# Patient Record
Sex: Female | Born: 1950
Health system: Southern US, Community
[De-identification: ages and names within clinical notes are randomized; demographics above are authoritative.]

## PROBLEM LIST (undated history)

## (undated) DIAGNOSIS — N200 Calculus of kidney: Secondary | ICD-10-CM

## (undated) DIAGNOSIS — R0600 Dyspnea, unspecified: Secondary | ICD-10-CM

## (undated) DIAGNOSIS — R1310 Dysphagia, unspecified: Secondary | ICD-10-CM

## (undated) DIAGNOSIS — J9383 Other pneumothorax: Secondary | ICD-10-CM

## (undated) DIAGNOSIS — M8580 Other specified disorders of bone density and structure, unspecified site: Secondary | ICD-10-CM

## (undated) DIAGNOSIS — T4145XA Adverse effect of unspecified anesthetic, initial encounter: Secondary | ICD-10-CM

## (undated) DIAGNOSIS — K635 Polyp of colon: Secondary | ICD-10-CM

## (undated) DIAGNOSIS — E039 Hypothyroidism, unspecified: Secondary | ICD-10-CM

## (undated) DIAGNOSIS — R42 Dizziness and giddiness: Secondary | ICD-10-CM

## (undated) DIAGNOSIS — Z87442 Personal history of urinary calculi: Secondary | ICD-10-CM

## (undated) DIAGNOSIS — K562 Volvulus: Secondary | ICD-10-CM

## (undated) DIAGNOSIS — M1990 Unspecified osteoarthritis, unspecified site: Secondary | ICD-10-CM

## (undated) DIAGNOSIS — Z8709 Personal history of other diseases of the respiratory system: Secondary | ICD-10-CM

## (undated) DIAGNOSIS — K31A0 Gastric intestinal metaplasia, unspecified: Secondary | ICD-10-CM

## (undated) DIAGNOSIS — M419 Scoliosis, unspecified: Secondary | ICD-10-CM

## (undated) DIAGNOSIS — I10 Essential (primary) hypertension: Secondary | ICD-10-CM

## (undated) DIAGNOSIS — A6000 Herpesviral infection of urogenital system, unspecified: Secondary | ICD-10-CM

## (undated) DIAGNOSIS — K5090 Crohn's disease, unspecified, without complications: Secondary | ICD-10-CM

## (undated) DIAGNOSIS — T8859XA Other complications of anesthesia, initial encounter: Secondary | ICD-10-CM

## (undated) DIAGNOSIS — K2970 Gastritis, unspecified, without bleeding: Secondary | ICD-10-CM

## (undated) DIAGNOSIS — R0609 Other forms of dyspnea: Secondary | ICD-10-CM

## (undated) DIAGNOSIS — J449 Chronic obstructive pulmonary disease, unspecified: Secondary | ICD-10-CM

## (undated) DIAGNOSIS — D125 Benign neoplasm of sigmoid colon: Secondary | ICD-10-CM

## (undated) DIAGNOSIS — Z87891 Personal history of nicotine dependence: Secondary | ICD-10-CM

## (undated) DIAGNOSIS — C801 Malignant (primary) neoplasm, unspecified: Secondary | ICD-10-CM

## (undated) DIAGNOSIS — K3189 Other diseases of stomach and duodenum: Secondary | ICD-10-CM

## (undated) HISTORY — PX: BREAST EXCISIONAL BIOPSY: SUR124

## (undated) HISTORY — DX: Calculus of kidney: N20.0

## (undated) HISTORY — DX: Dysphagia, unspecified: R13.10

## (undated) HISTORY — DX: Gastric intestinal metaplasia, unspecified: K31.A0

## (undated) HISTORY — DX: Polyp of colon: K63.5

## (undated) HISTORY — PX: APPENDECTOMY: SHX54

## (undated) HISTORY — PX: TUBAL LIGATION: SHX77

## (undated) HISTORY — DX: Herpesviral infection of urogenital system, unspecified: A60.00

## (undated) HISTORY — DX: Hypothyroidism, unspecified: E03.9

## (undated) HISTORY — DX: Benign neoplasm of sigmoid colon: D12.5

## (undated) HISTORY — DX: Other specified disorders of bone density and structure, unspecified site: M85.80

## (undated) HISTORY — PX: ABDOMINAL HYSTERECTOMY: SHX81

## (undated) HISTORY — DX: Other diseases of stomach and duodenum: K31.89

## (undated) HISTORY — DX: Crohn's disease, unspecified, without complications: K50.90

## (undated) HISTORY — DX: Volvulus: K56.2

## (undated) HISTORY — DX: Other pneumothorax: J93.83

## (undated) HISTORY — DX: Personal history of other diseases of the respiratory system: Z87.09

## (undated) HISTORY — DX: Unspecified osteoarthritis, unspecified site: M19.90

## (undated) HISTORY — DX: Gastritis, unspecified, without bleeding: K29.70

## (undated) HISTORY — DX: Malignant (primary) neoplasm, unspecified: C80.1

## (undated) HISTORY — DX: Chronic obstructive pulmonary disease, unspecified: J44.9

## (undated) HISTORY — DX: Personal history of nicotine dependence: Z87.891

---

## 1990-06-29 HISTORY — PX: VOLVULUS REDUCTION: SHX425

## 2010-07-10 ENCOUNTER — Ambulatory Visit: Payer: Self-pay | Admitting: Family Medicine

## 2010-09-03 ENCOUNTER — Ambulatory Visit: Payer: Self-pay | Admitting: Family Medicine

## 2011-03-18 ENCOUNTER — Ambulatory Visit: Payer: Self-pay | Admitting: Family Medicine

## 2012-07-11 ENCOUNTER — Ambulatory Visit: Payer: Self-pay | Admitting: Unknown Physician Specialty

## 2012-09-06 ENCOUNTER — Ambulatory Visit: Payer: Self-pay | Admitting: Family Medicine

## 2012-09-21 ENCOUNTER — Ambulatory Visit: Payer: Self-pay | Admitting: Family Medicine

## 2013-10-11 ENCOUNTER — Other Ambulatory Visit: Payer: Self-pay

## 2014-03-29 ENCOUNTER — Other Ambulatory Visit: Payer: Self-pay | Admitting: Unknown Physician Specialty

## 2014-03-29 LAB — CLOSTRIDIUM DIFFICILE(ARMC)

## 2014-03-31 LAB — STOOL CULTURE

## 2014-06-23 ENCOUNTER — Ambulatory Visit: Payer: Self-pay | Admitting: Physician Assistant

## 2014-07-24 ENCOUNTER — Inpatient Hospital Stay: Payer: Self-pay | Admitting: Surgery

## 2014-07-24 ENCOUNTER — Ambulatory Visit: Payer: Self-pay

## 2014-07-24 LAB — BASIC METABOLIC PANEL
ANION GAP: 10 (ref 7–16)
BUN: 9 mg/dL (ref 7–18)
CALCIUM: 9.3 mg/dL (ref 8.5–10.1)
CHLORIDE: 105 mmol/L (ref 98–107)
Co2: 27 mmol/L (ref 21–32)
Creatinine: 0.71 mg/dL (ref 0.60–1.30)
EGFR (African American): >60
EGFR (Non-African Amer.): >60
Glucose: 83 mg/dL (ref 65–99)
OSMOLALITY: 281 (ref 275–301)
POTASSIUM: 3.6 mmol/L (ref 3.5–5.1)
Sodium: 142 mmol/L (ref 136–145)

## 2014-07-24 LAB — CBC WITH DIFFERENTIAL/PLATELET
BASOS ABS: 0.1 10*3/uL (ref 0.0–0.1)
BASOS PCT: 1 %
Eosinophil #: 0 10*3/uL (ref 0.0–0.7)
Eosinophil %: 0.5 %
HCT: 41.5 % (ref 35.0–47.0)
HGB: 13.9 g/dL (ref 12.0–16.0)
LYMPHS ABS: 1.8 10*3/uL (ref 1.0–3.6)
Lymphocyte %: 32.4 %
MCH: 37 pg — ABNORMAL HIGH (ref 26.0–34.0)
MCHC: 33.5 g/dL (ref 32.0–36.0)
MCV: 111 fL — AB (ref 80–100)
MONOS PCT: 7.3 %
Monocyte #: 0.4 x10 3/mm (ref 0.2–0.9)
NEUTROS ABS: 3.3 10*3/uL (ref 1.4–6.5)
Neutrophil %: 58.8 %
Platelet: 516 10*3/uL — ABNORMAL HIGH (ref 150–440)
RBC: 3.75 10*6/uL — ABNORMAL LOW (ref 3.80–5.20)
RDW: 16.2 % — AB (ref 11.5–14.5)
WBC: 5.7 10*3/uL (ref 3.6–11.0)

## 2014-07-24 LAB — PROTIME-INR
INR: 1.1
Prothrombin Time: 14.5 secs (ref 11.5–14.7)

## 2014-07-24 LAB — APTT: Activated PTT: 40.4 secs — ABNORMAL HIGH (ref 23.6–35.9)

## 2014-07-30 HISTORY — PX: TALC PLEURODESIS: SHX2506

## 2014-07-30 HISTORY — PX: THORACOSCOPY: SUR1347

## 2014-08-02 ENCOUNTER — Ambulatory Visit: Payer: Self-pay | Admitting: Cardiothoracic Surgery

## 2014-08-02 DIAGNOSIS — J90 Pleural effusion, not elsewhere classified: Secondary | ICD-10-CM | POA: Diagnosis not present

## 2014-08-02 DIAGNOSIS — J9311 Primary spontaneous pneumothorax: Secondary | ICD-10-CM | POA: Diagnosis not present

## 2014-08-02 DIAGNOSIS — J939 Pneumothorax, unspecified: Secondary | ICD-10-CM | POA: Diagnosis not present

## 2014-08-08 DIAGNOSIS — J449 Chronic obstructive pulmonary disease, unspecified: Secondary | ICD-10-CM | POA: Diagnosis not present

## 2014-08-08 DIAGNOSIS — R030 Elevated blood-pressure reading, without diagnosis of hypertension: Secondary | ICD-10-CM | POA: Diagnosis not present

## 2014-08-08 DIAGNOSIS — E039 Hypothyroidism, unspecified: Secondary | ICD-10-CM | POA: Diagnosis not present

## 2014-08-08 DIAGNOSIS — K5090 Crohn's disease, unspecified, without complications: Secondary | ICD-10-CM | POA: Diagnosis not present

## 2014-08-13 ENCOUNTER — Inpatient Hospital Stay: Payer: Self-pay | Admitting: Cardiothoracic Surgery

## 2014-08-13 DIAGNOSIS — F17210 Nicotine dependence, cigarettes, uncomplicated: Secondary | ICD-10-CM | POA: Diagnosis not present

## 2014-08-13 DIAGNOSIS — J439 Emphysema, unspecified: Secondary | ICD-10-CM | POA: Diagnosis not present

## 2014-08-13 DIAGNOSIS — J90 Pleural effusion, not elsewhere classified: Secondary | ICD-10-CM | POA: Diagnosis not present

## 2014-08-13 DIAGNOSIS — K5090 Crohn's disease, unspecified, without complications: Secondary | ICD-10-CM | POA: Diagnosis not present

## 2014-08-13 DIAGNOSIS — J984 Other disorders of lung: Secondary | ICD-10-CM | POA: Diagnosis not present

## 2014-08-13 DIAGNOSIS — J982 Interstitial emphysema: Secondary | ICD-10-CM | POA: Diagnosis not present

## 2014-08-13 DIAGNOSIS — J9383 Other pneumothorax: Secondary | ICD-10-CM | POA: Diagnosis not present

## 2014-08-13 DIAGNOSIS — J948 Other specified pleural conditions: Secondary | ICD-10-CM | POA: Diagnosis not present

## 2014-08-13 DIAGNOSIS — E039 Hypothyroidism, unspecified: Secondary | ICD-10-CM | POA: Diagnosis not present

## 2014-08-13 DIAGNOSIS — T8182XA Emphysema (subcutaneous) resulting from a procedure, initial encounter: Secondary | ICD-10-CM | POA: Diagnosis not present

## 2014-08-13 DIAGNOSIS — J939 Pneumothorax, unspecified: Secondary | ICD-10-CM | POA: Diagnosis not present

## 2014-08-13 DIAGNOSIS — Z8249 Family history of ischemic heart disease and other diseases of the circulatory system: Secondary | ICD-10-CM | POA: Diagnosis not present

## 2014-08-13 DIAGNOSIS — R0689 Other abnormalities of breathing: Secondary | ICD-10-CM | POA: Diagnosis not present

## 2014-08-13 DIAGNOSIS — J9809 Other diseases of bronchus, not elsewhere classified: Secondary | ICD-10-CM | POA: Diagnosis not present

## 2014-08-13 DIAGNOSIS — Z4682 Encounter for fitting and adjustment of non-vascular catheter: Secondary | ICD-10-CM | POA: Diagnosis not present

## 2014-08-13 DIAGNOSIS — R079 Chest pain, unspecified: Secondary | ICD-10-CM | POA: Diagnosis not present

## 2014-08-13 DIAGNOSIS — I700 Atherosclerosis of aorta: Secondary | ICD-10-CM | POA: Diagnosis not present

## 2014-08-13 DIAGNOSIS — B009 Herpesviral infection, unspecified: Secondary | ICD-10-CM | POA: Diagnosis not present

## 2014-08-13 DIAGNOSIS — J9811 Atelectasis: Secondary | ICD-10-CM | POA: Diagnosis not present

## 2014-08-13 DIAGNOSIS — J9382 Other air leak: Secondary | ICD-10-CM | POA: Diagnosis not present

## 2014-08-13 DIAGNOSIS — J432 Centrilobular emphysema: Secondary | ICD-10-CM | POA: Diagnosis not present

## 2014-08-28 ENCOUNTER — Ambulatory Visit
Admit: 2014-08-28 | Disposition: A | Discharge status: Home / Self Care | Payer: Self-pay | Attending: Cardiothoracic Surgery | Admitting: Cardiothoracic Surgery

## 2014-08-30 DIAGNOSIS — J90 Pleural effusion, not elsewhere classified: Secondary | ICD-10-CM | POA: Diagnosis not present

## 2014-08-30 DIAGNOSIS — J939 Pneumothorax, unspecified: Secondary | ICD-10-CM | POA: Diagnosis not present

## 2014-09-03 DIAGNOSIS — J449 Chronic obstructive pulmonary disease, unspecified: Secondary | ICD-10-CM | POA: Diagnosis not present

## 2014-09-03 DIAGNOSIS — E039 Hypothyroidism, unspecified: Secondary | ICD-10-CM | POA: Diagnosis not present

## 2014-09-03 DIAGNOSIS — J939 Pneumothorax, unspecified: Secondary | ICD-10-CM | POA: Diagnosis not present

## 2014-09-06 ENCOUNTER — Encounter: Payer: Self-pay | Admitting: Internal Medicine

## 2014-09-06 ENCOUNTER — Ambulatory Visit (INDEPENDENT_AMBULATORY_CARE_PROVIDER_SITE_OTHER): Payer: Commercial Managed Care - HMO | Admitting: Internal Medicine

## 2014-09-06 VITALS — BP 124/66 | HR 77 | Temp 97.8°F | Ht 61.0 in | Wt 129.0 lb

## 2014-09-06 DIAGNOSIS — Z716 Tobacco abuse counseling: Secondary | ICD-10-CM | POA: Diagnosis not present

## 2014-09-06 DIAGNOSIS — R0600 Dyspnea, unspecified: Secondary | ICD-10-CM | POA: Diagnosis not present

## 2014-09-06 DIAGNOSIS — J431 Panlobular emphysema: Secondary | ICD-10-CM | POA: Diagnosis not present

## 2014-09-06 DIAGNOSIS — J9383 Other pneumothorax: Secondary | ICD-10-CM | POA: Diagnosis not present

## 2014-09-06 DIAGNOSIS — J449 Chronic obstructive pulmonary disease, unspecified: Secondary | ICD-10-CM | POA: Insufficient documentation

## 2014-09-06 NOTE — Patient Instructions (Signed)
Follow up with Dr. Stevenson Clinch in 6 weeks - pulmonary function testing and 6 minute walk test prior to follow up - avoid tobacco including - second hand smoke, cigar, ecig, vapor, chewing\snuff tobacco - continue using your incentive spirometry 3-4 times per day - exercise as tolerated.

## 2014-09-06 NOTE — Progress Notes (Signed)
Date: 09/06/2014  MRN# [id number] [name] 09-09-1950  Referring Physician: Dr. Marta Rodriguez  Joanna Rodriguez is a 64 y.o. old female seen in consultation for copd evaluation and spontaneous pneumothorax  CC:  Chief Complaint  Patient presents with  . Advice Only    Pt had pneumo, collapsed lung and 2 chest tubes Jan 26/16 and     HPI:  This is a pleasant 64 year old female referred by Dr. Durwin Rodriguez for COPD evaluation and workup along with episodes of 2 spontaneous pneumothorax in the last 2 months. Below is her smoking and history of COPD: - 1 episode of bronchitis per year, not every year - No intubations prior to recent hospitalization - No childhood asthma - Pet dog at home - Quit smoking Nov 2015 - No seasonal allergies - Left chest wall tenderness - Still with DOE - can walk to mailbox, or about 3 car lenghts  Patient stated that in Nov 2015 she had a bad "bronchitis" for which he continued to coughing spells and sob, in Jan 2015 had significant SOB, CXR showed L pneumothorax.  Had chest tube placed at time, then in Feb 2015, had a another spontaneous pneumothorax which required chest tube placed along with talc pleurodesis.  During per talc pleurodesis procedure Dr. Genevive Rodriguez noted a single bleb on the left side.  Currently patient states that she is doing well, still with shortness of breath and cough, but overall with improvement.  She is currently not smoking   [facility] hospitalization: 08/13/2014 to 08/22/2014  Admitted for another episode of spontaneous pneumothorax on the left side Joanna Rodriguez is a 64 year old woman who presented with her second spontaneous pneumothorax on the left side within the last 2 months. Because of the CT scan findings showing severe emphysematous changes throughout all lobes of the lung, there was no one area in particular to resect. Therefore, she was offered a talc pleurodesis for management of her recurrent pneumothorax.    Procedures 08/13/2014-left chest tube placement or pneumothorax\hydropneumothorax 08/14/2014-talc pleurodesis  DATE OF ADMISSION:  08/13/2014 DATE OF DISCHARGE:  08/22/2014  ADMITTING DIAGNOSIS:  Recurrent pneumothorax.   DISCHARGE DIAGNOSIS:  Recurrent pneumothorax.   OPERATION PERFORMED:   1.  Insertion of tube thoracostomy.  2.  Left thoracoscopy with talc pleurodesis.   HOSPITAL COURSE:  Joanna Rodriguez is a 64 year old woman with an extensive smoking history and a prior history of a left-sided pneumothorax. She was admitted to the hospital on February 15, at which time she was found to have a large left-sided recurrent pneumothorax. She was initially treated with a chest tube, and her lung re-expanded. She was taken to the operating room on 08/14/2014, where she underwent a thoracoscopy with talc pleurodesis. At the time of her surgery, she had extensive underlying emphysematous changes without any obvious single solitary bleb disease. After her thoracoscopy, she continued to have extensive subcutaneous emphysema, but the tube appeared to be in good position. She was taken to the radiology suite, where a percutaneous drain was placed for several days. The operative tube was removed, and finally the single small pigtail catheter was removed as well. At no time did she have a pneumothorax that was obvious after the tubes were removed. Her subcutaneous emphysema continued to improve and at the time of discharge was minimal to mild.   At the time of discharge, her wounds were all healing as expected. She did not have any chest tubes in place. She was instructed to go back on her  medications, which included Synthroid, acyclovir, mercaptopurine, tramadol, and Percocet. She will follow up with Dr. Genevive Rodriguez in one week.    [facility] 1/26-1/29/2016 DATE OF ADMISSION:  07/24/2014 DATE OF DISCHARGE:  07/27/2014  BRIEF HISTORY: Joanna Rodriguez is a 64 year old woman seen in the  Emergency Room with a fairly large left pneumothorax. She has had marked problems with shortness of breath and upper respiratory infections for almost a month. She presented to the Emergency Room because she had increasing shortness of breath after evaluation at the [facility] primary care office in [place]. Chest x-ray revealed a significant pneumothorax. A chest tube was placed by the Emergency Room physician which accomplished almost complete re-expansion. She was admitted to hospital, maintained on suction, seen by the thoracic surgery service. Her air leak resolved. The tube was removed on the 29th and she was discharged home for followup in 1 week's time. Bathing, activity, and driving instructions were given to the patient.   DISCHARGE MEDICATIONS: She is to resume her home medications which include acyclovir 400 mg once a day, Synthroid 100 mcg once a day, tramadol 50 mg t.i.d., and mercaptopurine 50 mg tablets 1-1/2 tablets once a day.   FINAL DISCHARGE DIAGNOSIS: Upper respiratory infection with spontaneous pneumothorax.    PMHX:   Past Medical History  Diagnosis Date  . Hypothyroid   . Genital herpes   . Crohn's disease   . Volvulus    Surgical Hx:  Past Surgical History  Procedure Laterality Date  . Volvulus reduction    . Tubal ligation    . Talc pleurodesis  07/2014    left side  . Thoracoscopy  07/2014    left side   Family Hx:  Family History  Problem Relation Age of Onset  . Hypertension Mother   . Hypothyroidism Mother   . Alcohol abuse Mother   . Heart attack Father   . Heart disease Father   . Glaucoma Father    Social Hx:   History  Substance Use Topics  . Smoking status: Former Smoker -- 1.00 packs/day for 53 years    Types: Cigarettes  . Smokeless tobacco: Never Used     Comment: NOV 16,2015  . Alcohol Use: 0.0 oz/week    0 Standard drinks or equivalent per week     Comment: occasional   Medication:   Current Outpatient Rx  Name  Route  Sig  Dispense   Refill  . acyclovir (ZOVIRAX) 400 MG tablet   Oral   Take 400 mg by mouth daily.         Marland Kitchen aspirin EC 81 MG tablet   Oral   Take 81 mg by mouth daily.         . B Complex-Folic Acid (B COMPLEX-VITAMIN B12 PO)   Oral   Take 1 tablet by mouth daily.         . Calcium-Vitamin D 600-200 MG-UNIT per tablet   Oral   Take 1 tablet by mouth daily.         Marland Kitchen Co-Enzyme Q-10 30 MG CAPS   Oral   Take 1 capsule by mouth daily.         . Glucosamine-Chondroitin 500-400 MG CAPS   Oral   Take 2 tablets by mouth daily.         Marland Kitchen levothyroxine (SYNTHROID, LEVOTHROID) 100 MCG tablet   Oral   Take 100 mcg by mouth daily.         . Melatonin 5 MG TABS  Oral   Take 5 mg by mouth at bedtime as needed.         . mercaptopurine (PURINETHOL) 50 MG tablet   Oral   Take 50 mg by mouth daily. Take 1.5 tablets daily         . Multiple Vitamins-Minerals (MULTIVITAMIN WITH MINERALS) tablet   Oral   Take 1 tablet by mouth daily.         . Omega-3 Fatty Acids (FISH OIL) 1000 MG CAPS   Oral   Take 1,000 mg by mouth daily.         . traMADol (ULTRAM) 50 MG tablet   Oral   Take 50 mg by mouth every 4 (four) hours as needed.             Allergies:  Nsaids  Review of Systems: Gen:  Denies  fever, sweats, chills HEENT: Denies blurred vision, double vision, ear pain, eye pain, hearing loss, nose bleeds, sore throat Cvc:  No dizziness, chest pain or heaviness Resp:   Sob, DOE, chest wall pain on the left Gi: Denies swallowing difficulty, stomach pain, nausea or vomiting, diarrhea, constipation, bowel incontinence Gu:  Denies bladder incontinence, burning urine Ext:   No Joint pain, stiffness or swelling Skin: No skin rash, easy bruising or bleeding or hives Endoc:  No polyuria, polydipsia , polyphagia or weight change Psych: No depression, insomnia or hallucinations  Other:  All other systems negative  Physical Examination:   VS: There were no vitals taken for  this visit.  General Appearance: No distress  Neuro:without focal findings, mental status, speech normal, alert and oriented, cranial nerves 2-12 intact, reflexes normal and symmetric, sensation grossly normal  HEENT: PERRLA, EOM intact, no ptosis, no other lesions noticed; Mallampati 1 Pulmonary: normal breath sounds., diaphragmatic excursion normal.No wheezing, No rales, dec BS on the left base;   Sputum Production: none  CardiovascularNormal S1,S2.  No m/r/g.  Abdominal aorta pulsation normal.    Abdomen: Benign, Soft, non-tender, No masses, hepatosplenomegaly, No lymphadenopathy Renal:  No costovertebral tenderness  GU:  No performed at this time. Endoc: No evident thyromegaly, no signs of acromegaly or Cushing features Skin:   warm, no rashes, no ecchymosis  Extremities: normal, no cyanosis, clubbing, no edema, warm with normal capillary refill. Other findings:none   Labs results:   Rad results: (The following images and results were reviewed by Dr. Stevenson Clinch). 07/24/14 CXR There is a large pneumothorax on the left with slight tension component. The right lung is clear. There is atelectasis in the left base. The heart size is normal. The pulmonary vascularity is normal on the right and is reflective of the pneumothorax on the left. No adenopathy.  IMPRESSION: Large pneumothorax on the left with mild tension component.  08/30/14 CXR FINDINGS: Small left pleural effusion. No pneumothorax. No focal consolidation. Stable cardiomediastinal silhouette.  Near complete resolution of soft tissue emphysema.  The osseous structures are unremarkable.  IMPRESSION: Small left pleural effusion without a pneumothorax.   PROCEDURE: CT  - CT CHEST WITH CONTRAST  - Aug 13 2014  2:01PM   CLINICAL DATA:  64 year old with two recent episodes of spontaneous pneumothorax, the 1st on 07/24/2014 and the 2nd earlier today when she was out walking her dog, took a deep breath and had acute onset of  left-sided chest pain and shortness of breath. Chest tube placed earlier today while the patient was in the emergency department. Long-time smoker.  EXAM: CT CHEST WITH CONTRAST TECHNIQUE: Multidetector CT imaging  of the chest was performed during intravenous contrast administration.  CONTRAST:  75 ml Omnipaque 350 IV.  COMPARISON:  No prior chest CT. Multiple prior chest x-rays including earlier today.  FINDINGS: Residual small (15-20%) left pneumothorax. The indwelling left chest tube lies within the major fissure.  Severe emphysematous changes throughout both lungs. Mild atelectasis deep in the left lower lobe. Lungs otherwise clear without localized airspace consolidation, interstitial disease, or parenchymal nodules or masses. No pleural effusions. Central airways patent with moderate bronchial wall thickening.  No significant mediastinal, hilar or axillary lymphadenopathy. Thyroid gland normal in appearance.  Heart size normal. No visible coronary atherosclerosis. No pericardial effusion. Moderate atherosclerosis involving the thoracic and upper abdominal aorta. Calcified and noncalcified plaque at the origin of the left subclavian artery without evidence of hemodynamically significant stenosis.  Diffuse steatosis throughout the visualized liver. Small low-attenuation nodules involving both adrenal glands, that on the right measuring approximately 1.6 x 0.9 cm and the left measuring approximately 1.4 x 1.4 cm. Large amount of food within the normal-appearing stomach. This accounts for the contracted gallbladder. Remaining visualized upper abdomen unremarkable. Bone window images demonstrate diffuse degenerative disc disease and spondylosis throughout the lower cervical and thoracic spine.   IMPRESSION: 1. Residual small (15 20% or so) left pneumothorax with left chest tube in place. The chest tube is located within the major fissure. 2. Severe COPD/emphysema. 3.  Mild atelectasis deep in the left lower lobe. No acute cardiopulmonary disease otherwise. 4. Small bilateral adrenal nodules statistically consistent with adenomas.   Assessment and Plan:64 yo female former smoker with PMHx of hypothyroidism, recent Spontaneous Pneumothorax s\p talc pleurodesis, presenting for COPD workup and evaluation.  Spontaneous pneumothorax Hx of 2 left spontaneous PTX in Jan and Feb 2015, probably secondary to a bleb and coughing spells. She is has a severe emphysema per her CT. Now s\p talc Left pleurodesis. Given that she has severe emphysema on her current CT chest, she still at high risk for pneumothorax (especially on the right), cough control and avoidance of tobacco will be paramount in avoiding lowering her risk for another pneumothorax.  Plan: -Management as stated for COPD. -Avoid tobacco (all forms of it).   Dyspnea Multifactorial: COPD, deconditioning, pneumothorax, restricted chest wall mechanics secondary to pain and recent thoraoscopy with pleurodesis  Plan: -Exercise as tolerated, advance slowly. -Advised patient to continue with incentive spirometry on a daily basis -Avoid tobacco   Tobacco abuse counseling Tobacco Cessation - Counseling regarding benefits of smoking cessation strategies was provided for more than 12 min. - Educated that at this time smoking- cessation represents the single most important step that patient can take to enhance the length and quality of live. - Educated patient regarding alternatives of behavior interventions, pharmacotherapy including NRT and non-nicotine therapy such, and combinations of both. - Patient at this time: has quit    COPD (chronic obstructive pulmonary disease) Clinically patient with significant risk factors for COPD, and will diagnose as such. COPD-currently unable to objectively stage her level of obstruction, given that she has recently had a pneumothorax with chest tube placement and  pleurodesis on the left side. We agreed today that adequate healing time and small and simple breathing exercises with her incentive spirometer will provide benefit for her. Patient was educated on pneumothorax and COPD, she was noted to have 1 bullae during her recent hospitalization, but with severe emphysema noted on a CAT scan she is at increased risk for developing more bullae in the future (especially she  resumes smoking).   Plan: - Pulmonary function testing in 6 minute walk testing in 6 weeks - Avoid tobacco including the following: First and smoking, secondhand smoking, cigars, it yet chronic cigarettes, vapors, chewing tobacco or snuff - Continue using incentive spirometry 3-4 times per day - Exercise as tolerated, start off slow. - will consider starting bronchodilators (Symbicort and/or Spiriva) if the patient continues to have cough or worsening shortness of breath prior to followup visit.     Updated Medication List Outpatient Encounter Prescriptions as of 09/06/2014  Medication Sig  . acyclovir (ZOVIRAX) 400 MG tablet Take 400 mg by mouth daily.  Marland Kitchen aspirin EC 81 MG tablet Take 81 mg by mouth daily.  . B Complex-Folic Acid (B COMPLEX-VITAMIN B12 PO) Take 1 tablet by mouth daily.  . Calcium-Vitamin D 600-200 MG-UNIT per tablet Take 1 tablet by mouth daily.  Marland Kitchen Co-Enzyme Q-10 30 MG CAPS Take 1 capsule by mouth daily.  . Glucosamine-Chondroitin 500-400 MG CAPS Take 2 tablets by mouth daily.  Marland Kitchen levothyroxine (SYNTHROID, LEVOTHROID) 100 MCG tablet Take 100 mcg by mouth daily.  . Melatonin 5 MG TABS Take 5 mg by mouth at bedtime as needed.  . mercaptopurine (PURINETHOL) 50 MG tablet Take 50 mg by mouth daily. Take 1.5 tablets daily  . Multiple Vitamins-Minerals (MULTIVITAMIN WITH MINERALS) tablet Take 1 tablet by mouth daily.  . Omega-3 Fatty Acids (FISH OIL) 1000 MG CAPS Take 1,000 mg by mouth daily.  . traMADol (ULTRAM) 50 MG tablet Take 50 mg by mouth every 4 (four) hours as  needed.    Orders for this visit: No orders of the defined types were placed in this encounter.     Thank  you for the consultation and for allowing [facility] to assist in the care of your patient. Our recommendations are noted above.  Please contact us if we can be of further service.   Vilinda Boehringer, MD [facility] Office Number: [phone]

## 2014-09-10 DIAGNOSIS — K5000 Crohn's disease of small intestine without complications: Secondary | ICD-10-CM | POA: Diagnosis not present

## 2014-09-17 ENCOUNTER — Encounter: Payer: Self-pay | Admitting: Internal Medicine

## 2014-09-17 DIAGNOSIS — Z716 Tobacco abuse counseling: Secondary | ICD-10-CM | POA: Insufficient documentation

## 2014-09-17 DIAGNOSIS — J9383 Other pneumothorax: Secondary | ICD-10-CM | POA: Insufficient documentation

## 2014-09-17 NOTE — Assessment & Plan Note (Signed)
Hx of 2 left spontaneous PTX in Jan and Feb 2015, probably secondary to a bleb and coughing spells. She is has a severe emphysema per her CT. Now s\p talc Left pleurodesis. Given that she has severe emphysema on her current CT chest, she still at high risk for pneumothorax (especially on the right), cough control and avoidance of tobacco will be paramount in avoiding lowering her risk for another pneumothorax.  Plan: -Management as stated for COPD. -Avoid tobacco (all forms of it).

## 2014-09-17 NOTE — Assessment & Plan Note (Signed)
Tobacco Cessation - Counseling regarding benefits of smoking cessation strategies was provided for more than 12 min. - Educated that at this time smoking- cessation represents the single most important step that patient can take to enhance the length and quality of live. - Educated patient regarding alternatives of behavior interventions, pharmacotherapy including NRT and non-nicotine therapy such, and combinations of both. - Patient at this time: has quit

## 2014-09-17 NOTE — Assessment & Plan Note (Signed)
Multifactorial: COPD, deconditioning, pneumothorax, restricted chest wall mechanics secondary to pain and recent thoraoscopy with pleurodesis  Plan: -Exercise as tolerated, advance slowly. -Advised patient to continue with incentive spirometry on a daily basis -Avoid tobacco

## 2014-09-17 NOTE — Assessment & Plan Note (Signed)
Clinically patient with significant risk factors for COPD, and will diagnose as such. COPD-currently unable to objectively stage her level of obstruction, given that she has recently had a pneumothorax with chest tube placement and pleurodesis on the left side. We agreed today that adequate healing time and small and simple breathing exercises with her incentive spirometer will provide benefit for her. Patient was educated on pneumothorax and COPD, she was noted to have 1 bullae during her recent hospitalization, but with severe emphysema noted on a CAT scan she is at increased risk for developing more bullae in the future (especially she resumes smoking).   Plan: - Pulmonary function testing in 6 minute walk testing in 6 weeks - Avoid tobacco including the following: First and smoking, secondhand smoking, cigars, it yet chronic cigarettes, vapors, chewing tobacco or snuff - Continue using incentive spirometry 3-4 times per day - Exercise as tolerated, start off slow. - will consider starting bronchodilators (Symbicort and/or Spiriva) if the patient continues to have cough or worsening shortness of breath prior to followup visit.

## 2014-09-28 ENCOUNTER — Ambulatory Visit
Admit: 2014-09-28 | Disposition: A | Discharge status: Home / Self Care | Payer: Self-pay | Attending: Cardiothoracic Surgery | Admitting: Cardiothoracic Surgery

## 2014-10-18 ENCOUNTER — Telehealth: Payer: Self-pay | Admitting: *Deleted

## 2014-10-18 DIAGNOSIS — J441 Chronic obstructive pulmonary disease with (acute) exacerbation: Secondary | ICD-10-CM

## 2014-10-18 DIAGNOSIS — R0600 Dyspnea, unspecified: Secondary | ICD-10-CM

## 2014-10-18 NOTE — Telephone Encounter (Signed)
PFT order placed

## 2014-10-19 DIAGNOSIS — R748 Abnormal levels of other serum enzymes: Secondary | ICD-10-CM | POA: Diagnosis not present

## 2014-10-23 ENCOUNTER — Encounter: Payer: Self-pay | Admitting: Internal Medicine

## 2014-10-23 ENCOUNTER — Ambulatory Visit (INDEPENDENT_AMBULATORY_CARE_PROVIDER_SITE_OTHER): Payer: Commercial Managed Care - HMO | Admitting: Internal Medicine

## 2014-10-23 VITALS — BP 148/80 | HR 77 | Ht 62.0 in | Wt 132.0 lb

## 2014-10-23 DIAGNOSIS — R0600 Dyspnea, unspecified: Secondary | ICD-10-CM

## 2014-10-23 DIAGNOSIS — J9383 Other pneumothorax: Secondary | ICD-10-CM

## 2014-10-23 DIAGNOSIS — Z716 Tobacco abuse counseling: Secondary | ICD-10-CM

## 2014-10-23 DIAGNOSIS — J431 Panlobular emphysema: Secondary | ICD-10-CM

## 2014-10-23 LAB — PULMONARY FUNCTION TEST
DL/VA % PRED: 49 %
DL/VA: 2.23 ml/min/mmHg/L
DLCO UNC: 17.68 ml/min/mmHg
DLCO unc % pred: 81 %
FEF 25-75 POST: 0.65 L/s
FEF 25-75 Pre: 0.54 L/sec
FEF2575-%Change-Post: 20 %
FEF2575-%PRED-POST: 33 %
FEF2575-%Pred-Pre: 27 %
FEV1-%CHANGE-POST: 11 %
FEV1-%PRED-POST: 58 %
FEV1-%Pred-Pre: 53 %
FEV1-POST: 1.3 L
FEV1-PRE: 1.17 L
FEV1FVC-%Change-Post: 2 %
FEV1FVC-%Pred-Pre: 68 %
FEV6-%Change-Post: 8 %
FEV6-%PRED-POST: 85 %
FEV6-%Pred-Pre: 78 %
FEV6-Post: 2.35 L
FEV6-Pre: 2.16 L
FEV6FVC-%CHANGE-POST: 0 %
FEV6FVC-%PRED-POST: 102 %
FEV6FVC-%Pred-Pre: 102 %
FVC-%Change-Post: 8 %
FVC-%PRED-POST: 82 %
FVC-%Pred-Pre: 76 %
FVC-Post: 2.38 L
FVC-Pre: 2.2 L
PRE FEV1/FVC RATIO: 53 %
PRE FEV6/FVC RATIO: 98 %
Post FEV1/FVC ratio: 54 %
Post FEV6/FVC ratio: 99 %

## 2014-10-23 MED ORDER — FLUTICASONE-SALMETEROL 500-50 MCG/DOSE IN AEPB: 1.0000 | INHALATION_SPRAY | Freq: Every day | RESPIRATORY_TRACT | Status: DC

## 2014-10-23 MED ORDER — TIOTROPIUM BROMIDE MONOHYDRATE 2.5 MCG/ACT IN AERS: 2.0000 | INHALATION_SPRAY | Freq: Every day | RESPIRATORY_TRACT | Status: DC

## 2014-10-23 NOTE — Progress Notes (Signed)
PFT performed today with Nitrogen washout. 

## 2014-10-23 NOTE — Progress Notes (Signed)
SMW performed today. 

## 2014-10-23 NOTE — Patient Instructions (Signed)
Follow up with Dr. Stevenson Clinch in 3 months - Advair 500/50 - 1 puff in the morning and 1 puff in the evening - rinse and gargle each use - Spiriva Respimat - 2 puff in the mornings only - rinse and gargle after each use - avoid tobacco.

## 2014-10-23 NOTE — Progress Notes (Signed)
MRN# [id number] Joanna Rodriguez [birth year]   IR:SWNIOEVO visit for COPD Chief Complaint  Patient presents with  . Follow-up    PFT/SMW today; SOB w/activity; cough, started recently;      Brief History: 09/03/14 This is a pleasant 64 year old female referred by Dr. Durwin Nora for COPD evaluation and workup along with episodes of 2 spontaneous pneumothorax in the last 2 months. Below is her smoking and history of COPD: - 1 episode of bronchitis per year, not every year - No intubations prior to recent hospitalization - No childhood asthma - Pet dog at home - Quit smoking Nov 2015 - No seasonal allergies - Left chest wall tenderness - Still with DOE - can walk to mailbox, or about 3 car lenghts  Patient stated that in Nov 2015 she had a bad "bronchitis" for which he continued to coughing spells and sob, in Jan 2015 had significant SOB, CXR showed L pneumothorax. Had chest tube placed at time, then in Feb 2015, had a another spontaneous pneumothorax which required chest tube placed along with talc pleurodesis.  During per talc pleurodesis procedure Dr. Genevive Bi noted a single bleb on the left side.  Currently patient states that she is doing well, still with shortness of breath and cough, but overall with improvement.  She is currently not smoking  PLAN - stop tobacco, pfts, 28mt   Events since last clinic visit: Presents today for a follow up visit. Patient with a past medical history spontaneous pneumothorax x2 status post thoracotomy and chest tube placement. Still with intermittent episodes of SOB, currently not on any inhalers. No recent ED\Urgent care visits. Currently not smoking.   PMHX:   Past Medical History  Diagnosis Date  . Hypothyroid   . Genital herpes   . Crohn's disease   . Volvulus    Surgical Hx:  Past Surgical History  Procedure Laterality Date  . Volvulus reduction    . Tubal ligation    . Talc pleurodesis  07/2014    left side  . Thoracoscopy   07/2014    left side   Family Hx:  Family History  Problem Relation Age of Onset  . Hypertension Mother   . Hypothyroidism Mother   . Alcohol abuse Mother   . Heart attack Father   . Heart disease Father   . Glaucoma Father    Social Hx:   History  Substance Use Topics  . Smoking status: Former Smoker -- 1.00 packs/day for 53 years    Types: Cigarettes  . Smokeless tobacco: Never Used     Comment: NOV 16,2015  . Alcohol Use: 0.0 oz/week    0 Standard drinks or equivalent per week     Comment: occasional   Medication:   Current Outpatient Rx  Name  Route  Sig  Dispense  Refill  . acyclovir (ZOVIRAX) 400 MG tablet   Oral   Take 400 mg by mouth daily.         .Marland Kitchenaspirin EC 81 MG tablet   Oral   Take 81 mg by mouth daily.         . B Complex-Folic Acid (B COMPLEX-VITAMIN B12 PO)   Oral   Take 1 tablet by mouth daily.         . Calcium-Vitamin D 600-200 MG-UNIT per tablet   Oral   Take 1 tablet by mouth daily.         .Marland KitchenCo-Enzyme Q-10 30 MG CAPS   Oral   Take  1 capsule by mouth daily.         . Glucosamine-Chondroitin 500-400 MG CAPS   Oral   Take 2 tablets by mouth daily.         Marland Kitchen levothyroxine (SYNTHROID, LEVOTHROID) 100 MCG tablet   Oral   Take 100 mcg by mouth daily.         . Melatonin 5 MG TABS   Oral   Take 5 mg by mouth at bedtime as needed.         . mercaptopurine (PURINETHOL) 50 MG tablet   Oral   Take 50 mg by mouth daily. Take 1.5 tablets daily         . Multiple Vitamins-Minerals (MULTIVITAMIN WITH MINERALS) tablet   Oral   Take 1 tablet by mouth daily.         . Omega-3 Fatty Acids (FISH OIL) 1000 MG CAPS   Oral   Take 1,000 mg by mouth daily.         . traMADol (ULTRAM) 50 MG tablet   Oral   Take 50 mg by mouth every 4 (four) hours as needed.            Review of Systems: Gen:  Denies  fever, sweats, chills HEENT: Denies blurred vision, double vision, ear pain, eye pain, hearing loss, nose bleeds, sore  throat Cvc:  No dizziness, chest pain or heaviness Resp:   Denies cough or sputum porduction, shortness of breath Gi: Denies swallowing difficulty, stomach pain, nausea or vomiting, diarrhea, constipation, bowel incontinence Gu:  Denies bladder incontinence, burning urine Ext:   No Joint pain, stiffness or swelling Skin: No skin rash, easy bruising or bleeding or hives Endoc:  No polyuria, polydipsia , polyphagia or weight change Psych: No depression, insomnia or hallucinations  Other:  All other systems negative  Allergies:  Nsaids  Physical Examination:  VS: BP 148/80 mmHg  Pulse 77  Ht 5' 2"  (1.575 m)  Wt 132 lb (59.875 kg)  BMI 24.14 kg/m2  SpO2 97%  General Appearance: No distress  Neuro: EXAM: without focal findings, mental status, speech normal, alert and oriented, cranial nerves 2-12 grossly normal  HEENT: PERRLA, EOM intact, no ptosis, no other lesions noticed Pulmonary:Exam: normal breath sounds., diaphragmatic excursion normal.No wheezing, No rales   Cardiovascular:@ Exam:  Normal S1,S2.  No m/r/g.     Abdomen:Exam: Benign, Soft, non-tender, No masses  Skin:   warm, no rashes, no ecchymosis  Extremities: normal, no cyanosis, clubbing, no edema, warm with normal capillary refill.   Labs results:  BMP No results found for: NA, K, CL, CO2, GLUCOSE, BUN, CREATININE   CBC No flowsheet data found.   6 minute walk test 10/23/2014: 279 m/19 and 15 feet, no desaturations below 90%.  Pulmonary function testing 10/23/2014 FVC 76% FEV1 53% FEV1/FVC 53 TLC 89% ERV 48% DLCO 81%. Impression: Moderate to severe obstruction, no significant change after bronchodilator administration. Severe decrease in    Assessment and Plan:64 year old female past medical history of former tobacco abuse, spontaneous pneumothorax x2, presenting for followup visit and COPD evaluation. COPD (chronic obstructive pulmonary disease) Clinically patient with for COPD, and with obstruction noted on  her pulmonary function testing. Patient was educated on pneumothorax and COPD, she was noted to have 1 bullae during her recent hospitalization, but with severe emphysema noted on a CAT scan she is at increased risk for developing more bullae in the future (especially she resumes smoking).   Plan: - Advair 500/50 - 1  puff in the morning and 1 puff in the evening - rinse and gargle each use - Spiriva Respimat - 2 puff in the mornings only - rinse and gargle after each use - avoid tobacco.     Dyspnea Multifactorial: COPD, deconditioning, pneumothorax, restricted chest wall mechanics secondary to pain and recent thoraoscopy with pleurodesis  Plan: -Exercise as tolerated, advance slowly. -Advised patient to continue with incentive spirometry on a daily basis -Avoid tobacco     Spontaneous pneumothorax Hx of 2 left spontaneous PTX in Jan and Feb 2015, probably secondary to a bleb and coughing spells. She is has a severe emphysema per her CT. Now s\p talc Left pleurodesis. Given that she has severe emphysema on her current CT chest, she still at high risk for pneumothorax (especially on the right), cough control and avoidance of tobacco will be paramount in avoiding lowering her risk for another pneumothorax.  Plan: -Management as stated for COPD. -Avoid tobacco (all forms of it).     Tobacco abuse counseling Tobacco Cessation - Counseling regarding benefits of smoking cessation strategies was provided for more than 12 min. - Educated that at this time smoking- cessation represents the single most important step that patient can take to enhance the length and quality of live. - Educated patient regarding alternatives of behavior interventions, pharmacotherapy including NRT and non-nicotine therapy such, and combinations of both. - Patient at this time: has quit        Updated Medication List Outpatient Encounter Prescriptions as of 10/23/2014  Medication Sig  . acyclovir  (ZOVIRAX) 400 MG tablet Take 400 mg by mouth daily.  Marland Kitchen aspirin EC 81 MG tablet Take 81 mg by mouth daily.  . B Complex-Folic Acid (B COMPLEX-VITAMIN B12 PO) Take 1 tablet by mouth daily.  . Calcium-Vitamin D 600-200 MG-UNIT per tablet Take 1 tablet by mouth daily.  Marland Kitchen Co-Enzyme Q-10 30 MG CAPS Take 1 capsule by mouth daily.  . Glucosamine-Chondroitin 500-400 MG CAPS Take 2 tablets by mouth daily.  Marland Kitchen levothyroxine (SYNTHROID, LEVOTHROID) 100 MCG tablet Take 100 mcg by mouth daily.  . Melatonin 5 MG TABS Take 5 mg by mouth at bedtime as needed.  . mercaptopurine (PURINETHOL) 50 MG tablet Take 50 mg by mouth daily. Take 1.5 tablets daily  . Multiple Vitamins-Minerals (MULTIVITAMIN WITH MINERALS) tablet Take 1 tablet by mouth daily.  . Omega-3 Fatty Acids (FISH OIL) 1000 MG CAPS Take 1,000 mg by mouth daily.  . traMADol (ULTRAM) 50 MG tablet Take 50 mg by mouth every 4 (four) hours as needed.  . Fluticasone-Salmeterol (ADVAIR DISKUS) 500-50 MCG/DOSE AEPB Inhale 1 puff into the lungs daily. Gargle and rinse after each use.  . Tiotropium Bromide Monohydrate (SPIRIVA RESPIMAT) 2.5 MCG/ACT AERS Inhale 2 puffs into the lungs daily. Gargle and rinse after each use    Orders for this visit: No orders of the defined types were placed in this encounter.    Thank  you for the visitation and for allowing  [facility] to assist in the care of your patient. Our recommendations are noted above.  Please contact us if we can be of further service.  Vilinda Boehringer, MD [facility] Office Number: [phone]

## 2014-10-24 ENCOUNTER — Other Ambulatory Visit: Payer: Self-pay

## 2014-10-24 DIAGNOSIS — I10 Essential (primary) hypertension: Secondary | ICD-10-CM | POA: Diagnosis not present

## 2014-10-24 DIAGNOSIS — M1990 Unspecified osteoarthritis, unspecified site: Secondary | ICD-10-CM | POA: Diagnosis not present

## 2014-10-24 DIAGNOSIS — R42 Dizziness and giddiness: Secondary | ICD-10-CM | POA: Diagnosis not present

## 2014-10-24 DIAGNOSIS — Z1231 Encounter for screening mammogram for malignant neoplasm of breast: Secondary | ICD-10-CM

## 2014-10-24 DIAGNOSIS — E039 Hypothyroidism, unspecified: Secondary | ICD-10-CM | POA: Diagnosis not present

## 2014-10-24 DIAGNOSIS — J449 Chronic obstructive pulmonary disease, unspecified: Secondary | ICD-10-CM | POA: Diagnosis not present

## 2014-10-24 DIAGNOSIS — Z0000 Encounter for general adult medical examination without abnormal findings: Secondary | ICD-10-CM | POA: Diagnosis not present

## 2014-10-24 DIAGNOSIS — K5090 Crohn's disease, unspecified, without complications: Secondary | ICD-10-CM | POA: Diagnosis not present

## 2014-10-24 MED ORDER — FLUTICASONE-SALMETEROL 500-50 MCG/DOSE IN AEPB: 1.0000 | INHALATION_SPRAY | Freq: Every day | RESPIRATORY_TRACT | Status: DC

## 2014-10-24 MED ORDER — TIOTROPIUM BROMIDE MONOHYDRATE 2.5 MCG/ACT IN AERS: 2.0000 | INHALATION_SPRAY | Freq: Every day | RESPIRATORY_TRACT | Status: DC

## 2014-10-24 NOTE — Assessment & Plan Note (Signed)
Tobacco Cessation - Counseling regarding benefits of smoking cessation strategies was provided for more than 12 min. - Educated that at this time smoking- cessation represents the single most important step that patient can take to enhance the length and quality of live. - Educated patient regarding alternatives of behavior interventions, pharmacotherapy including NRT and non-nicotine therapy such, and combinations of both. - Patient at this time: has quit

## 2014-10-24 NOTE — Assessment & Plan Note (Signed)
Clinically patient with for COPD, and with obstruction noted on her pulmonary function testing. Patient was educated on pneumothorax and COPD, she was noted to have 1 bullae during her recent hospitalization, but with severe emphysema noted on a CAT scan she is at increased risk for developing more bullae in the future (especially she resumes smoking).   Plan: - Advair 500/50 - 1 puff in the morning and 1 puff in the evening - rinse and gargle each use - Spiriva Respimat - 2 puff in the mornings only - rinse and gargle after each use - avoid tobacco.

## 2014-10-24 NOTE — Assessment & Plan Note (Signed)
Hx of 2 left spontaneous PTX in Jan and Feb 2015, probably secondary to a bleb and coughing spells. She is has a severe emphysema per her CT. Now s\p talc Left pleurodesis. Given that she has severe emphysema on her current CT chest, she still at high risk for pneumothorax (especially on the right), cough control and avoidance of tobacco will be paramount in avoiding lowering her risk for another pneumothorax.  Plan: -Management as stated for COPD. -Avoid tobacco (all forms of it).

## 2014-10-24 NOTE — Addendum Note (Signed)
Addended by: Oscar La R on: 10/24/2014 01:09 PM   Modules accepted: Orders

## 2014-10-24 NOTE — Assessment & Plan Note (Signed)
Multifactorial: COPD, deconditioning, pneumothorax, restricted chest wall mechanics secondary to pain and recent thoraoscopy with pleurodesis  Plan: -Exercise as tolerated, advance slowly. -Advised patient to continue with incentive spirometry on a daily basis -Avoid tobacco

## 2014-10-25 ENCOUNTER — Telehealth: Payer: Self-pay | Admitting: Internal Medicine

## 2014-10-25 NOTE — Telephone Encounter (Signed)
[place] and spoke to [place]. Erline Levine stated the pt has already picked up the rx. LMTCB for pt to be sure she knows the advair instructions.    Patient Instructions     Follow up with Dr. Stevenson Clinch in 3 months - Advair 500/50 - 1 puff in the morning and 1 puff in the evening - rinse and gargle each use - Spiriva Respimat - 2 puff in the mornings only - rinse and gargle after each use - avoid tobacco.

## 2014-10-25 NOTE — Telephone Encounter (Signed)
Pt returned call [phone]

## 2014-10-25 NOTE — Telephone Encounter (Signed)
Spoke with pt and clarified directions for Advair.  Pt verbalized understanding.

## 2014-10-28 NOTE — Consult Note (Signed)
Brief Consult Note: Diagnosis: recurrent left pneumothorax.   Patient was seen by consultant.   Consult note dictated.   Comments: Will obtain CT of chest.  Reviewed options with patient and family.  Likely secondary to emphysematous lung disease.  Have recommended VATS with talc.  Electronic Signatures: Louis Matte (MD)  (Signed 15-Feb-16 12:22)  Authored: Brief Consult Note   Last Updated: 15-Feb-16 12:22 by Louis Matte (MD)

## 2014-10-28 NOTE — H&P (Signed)
PATIENT NAME:  Joanna Rodriguez, Joanna Rodriguez MR#:  [id number] DATE OF BIRTH:  03-10-1951  DATE OF ADMISSION:  07/24/2014  PRIMARY CARE PHYSICIAN:  [facility]/Mark Bethann Punches, MD   ADMITTING PHYSICIAN:  Rodena Goldmann, III, MD   CHIEF COMPLAINT: Shortness of breath, dizziness.   BRIEF HISTORY: Ms. Esch is a 64 year old woman with a recent history of significant shortness of breath, upper respiratory infection symptoms. The day after Christmas, 2015, she went to [place], where she was diagnosed as having upper respiratory infection, placed on albuterol inhaler and Z-Pak. She then went to her primary care physician at the [facility] in [place], where she was started on prednisone. His symptoms improved, but did not stop. She is feeling better over the last couple of days and then developed some significant coughing yesterday and today. She became increasingly short of breath with increasing chest pain. She was unable to walk across the floor of her home without becoming short of breath and got quite dizzy trying to go up and down steps. She presented back to the [facility], where chest x-ray demonstrated approximately 50% pneumothorax. She was referred to the ED at [facility].   She had no history of significant lung problems prior to this episode. She has had intermittent URI symptoms but nothing as severe as this last month. She was regularly seen by the [facility], but her insurance has changed and she is now transferring her care to [facility]. [facility] in [place]. She was a cigarette smoker, giving up the habit in early November of 2015. She has not been smoking since. She is not oxygen dependent.  She has no history of pneumothorax in the past. She denies any trauma.   She denies any history of cardiac disease, hypertension, diabetes but she is hypothyroid. She does have history of chronic herpes and Crohn disease. Crohn disease is followed by Dr. Verta Ellen. She was  last seen in November, and has been on mercaptopurine. She does have a history of previous abdominal surgeries including appendectomy, hysterectomy and a small bowel volvulus, allegedly unrelated to her Crohn disease. She has developed a midline ventral wall hernia. She denies history of hepatitis, yellow jaundice, pancreatitis, peptic ulcer disease, gallbladder disease or diverticulitis.   CURRENT MEDICATIONS: Include acyclovir 400 mg daily, Synthroid 0.1 mg daily, tramadol 50 mg 3 to 4 times a day p.r.n. she has been on prednisone and Tussionex recently but is not taken those medications currently.   ALLERGIES: NONSTEROIDAL ANTI-INFLAMMATORY DRUGS WHICH PRECIPITATED AN EPISODE OF CROHN DISEASE IN THE PAST.   REVIEW OF SYSTEMS: Positive for shortness of breath as noted above but at the current time, no GI symptoms, no urinary tract symptoms; a ten-point review of systems is undertaken with the patient and she does not have any other positives than noted above.   FAMILY HISTORY: Negative with regard to the current condition. She does not have a family history of chronic obstructive lung disease.   SOCIAL HISTORY: She is currently employed, living at home without significant alcohol history. She is a reformed cigarette smoker as noted above.   PHYSICAL EXAMINATION:  GENERAL: She is lying comfortably in bed; chest tube was placed by an ED doctor prior to calling me.  VITAL SIGNS: Blood pressure 162/81, heart rate is 90, and regular; she is afebrile, she currently is not having any pain other than discomfort from her chest tube.  HEENT: Unremarkable. She has no scleral icterus or  pupillary abnormalities or facial deformities.  NECK: Supple, nontender, midline trachea.  CHEST: Clear, but she has very distant breath sounds. She has no wheezing or adventitious sounds noted.  CARDIAC: There are no murmurs or gallops.  She seems to be in normal sinus rhythm.  ABDOMEN: Soft with midline reducible hernia.  She has active bowel sounds.  EXTREMITIES: Lower extremities have a full range of motion; no deformities, good distal pulses.  PSYCHIATRIC: Normal orientation, normal affect.   I independently reviewed her chest x-rays both pre- and post, chest tube insertion. She does appear to have pretty good resolution of pneumothorax, although there is still some air space in the inferior portion of the chest x-ray which has filled with lung yet.   Laboratory values revealed normal white count and normal hemoglobin but she does have an elevated platelet count of 516,000.   We will admit her to the hospital, leave her on chest tube suction, and arrange for a pulmonary consult and a thoracic surgery consult. Hydrate her and control her pain. We talked with her about the possibility of needing further intervention with pleurodesis or possible surgical repair. She and her family are present for the interview and are in agreement with current plan.   Total time spent 50 minutes.    ____________________________ Rodena Goldmann III, MD rle:nt D: 07/24/2014 23:00:21 ET T: 07/24/2014 23:27:25 ET JOB#: [id number]  cc: Rodena Goldmann III, MD, <Dictator> Rodena Goldmann MD ELECTRONICALLY SIGNED 07/25/2014 22:02

## 2014-10-28 NOTE — Op Note (Signed)
PATIENT NAME:  Joanna Rodriguez, Joanna Rodriguez MR#:  [id number] DATE OF BIRTH:  23-Feb-1951  DATE OF PROCEDURE:  08/13/2014  PREOPERATIVE DIAGNOSIS: Spontaneous pneumothorax.   POSTOPERATIVE DIAGNOSIS: Spontaneous pneumothorax.   PROCEDURE: Left chest tube thoracostomy.  SURGEON: Rodena Goldmann, MD   ANESTHESIA: Local with sedation.   OPERATIVE PROCEDURE: With the patient in the supine position, after induction of appropriate intravenous sedation with Ativan, the patient's left chest was prepped with Betadine and draped with sterile towels. Then 0.25% Marcaine was used for anesthesia, injected in the anterior axillary line; 30 mL was utilized working over the rib into the intercostal muscles. A small skin incision was made. A 20-French chest tube was inserted without difficulty, directed toward the apex and mid chest. Immediate air return was noted. The patient did have a mild cough. The tube was secured with 3-0 silk attached to a Pleur-evac and dressed sterilely.   ____________________________ Rodena Goldmann III, MD rle:sb D: 08/13/2014 10:43:28 ET T: 08/13/2014 13:48:43 ET JOB#: [id number]  cc: Rodena Goldmann III, MD, <Dictator> Rodena Goldmann MD ELECTRONICALLY SIGNED 08/13/2014 17:42

## 2014-10-28 NOTE — Discharge Summary (Signed)
PATIENT NAME:  Rodriguez, Joanna MR#:  [id number] DATE OF BIRTH:  Dec 25, 1950  DATE OF ADMISSION:  08/13/2014 DATE OF DISCHARGE:  08/22/2014  ADMITTING DIAGNOSIS:  Recurrent pneumothorax.   DISCHARGE DIAGNOSIS:  Recurrent pneumothorax.   OPERATION PERFORMED:   1.  Insertion of tube thoracostomy.  2.  Left thoracoscopy with talc pleurodesis.   HOSPITAL COURSE:  [name] is a 64 year old woman with an extensive smoking history and a prior history of a left-sided pneumothorax. She was admitted to the hospital on February 15, at which time she was found to have a large left-sided recurrent pneumothorax. She was initially treated with a chest tube, and her lung re-expanded. She was taken to the operating room on 08/14/2014, where she underwent a thoracoscopy with talc pleurodesis. At the time of her surgery, she had extensive underlying emphysematous changes without any obvious single solitary bleb disease. After her thoracoscopy, she continued to have extensive subcutaneous emphysema, but the tube appeared to be in good position. She was taken to the radiology suite, where a percutaneous drain was placed for several days. The operative tube was removed, and finally the single small pigtail catheter was removed as well. At no time did she have a pneumothorax that was obvious after the tubes were removed. Her subcutaneous emphysema continued to improve and at the time of discharge was minimal to mild.   At the time of discharge, her wounds were all healing as expected. She did not have any chest tubes in place. She was instructed to go back on her medications, which included Synthroid, acyclovir, mercaptopurine, tramadol, and Percocet. She will follow up with Dr. Genevive Bi in one week.    ____________________________ Lew Dawes. Genevive Bi, MD teo:nb D: 09/06/2014 13:53:23 ET T: 09/06/2014 22:26:17 ET JOB#: [id number]  cc: Lew Dawes. Genevive Bi, MD, <Dictator> Louis Matte MD ELECTRONICALLY SIGNED 10/08/2014  12:06

## 2014-10-28 NOTE — Consult Note (Signed)
Brief Consult Note: Diagnosis: left pneumothorax.   Patient was seen by consultant.   Consult note dictated.   Discussed with Attending MD.   Comments: Pnemothorax secondary to cough in the setting of probable COPD.  CXRay shows the lung to be fully expanded now.  No air leak.  Leave on suction today and water seal in the morning if CXRay looks good.  Electronic Signatures: Louis Matte (MD)  (Signed 27-Jan-16 14:54)  Authored: Brief Consult Note   Last Updated: 27-Jan-16 14:54 by Louis Matte (MD)

## 2014-10-28 NOTE — Consult Note (Signed)
PATIENT NAME:  Joanna Rodriguez, Joanna Rodriguez MR#:  [id number] DATE OF BIRTH:  Mar 13, 1951  DATE OF CONSULTATION:  07/25/2014  REFERRING PHYSICIAN:  Dia Crawford, MD  CONSULTING PHYSICIAN:  Lew Dawes. Alyscia Carmon, MD  REASON FOR CONSULTATION: Management of chest tube.   HISTORY OF PRESENT ILLNESS: I have personally seen and examined Joanna Rodriguez. I have  independently reviewed her chart, as well as her medical record and x-rays.   HISTORY OF PRESENT ILLNESS: This is a 64 year old woman with a long-standing history of tobacco use, who quit in November 2015. Since that time, she has intermittently smoked, but has developed an upper respiratory tract infection, which she describes as consisting of a cough and some shortness of breath. She has been seen by her primary care physician, where she has been started on antibiotics as well as on prednisone, and felt somewhat better, although a day prior to her hospital admission, she experienced the acute onset of significant shortness of breath and chest pain. She re-presented to the [facility], where her chest x-ray showed a large pneumothorax and she was referred to the Emergency Department here at [facility]. She was seen by Dr. Pat Patrick, where a chest tube was inserted on the left side with prompt re-expansion of her lung. Since she has been in the hospital, she states that her breathing has improved. She did have significant pain after the chest tube was inserted, but this also has improved.   She is currently not short of breath. She has only minimal discomfort.   PAST MEDICAL HISTORY: Significant for a history of Crohn disease, and she is status post laparotomy for adhesions. She also has a history of appendectomy, hysterectomy and genital herpes.    FAMILY HISTORY: There is no family history of any lung disease. She does have a father with colon cancer.   SOCIAL HISTORY: She does not currently smoke. She does not use alcohol.   REVIEW OF SYSTEMS: As per history of  present illness; other pertinent positives included a weight gain of several pounds, which she feels is related to the steroids that she has been on. She also has had a very good appetite. She has occasional muscle cramps, for a considerable period of time and is currently being evaluated by her primary care physician for that. All other review of systems were negative.   PHYSICAL EXAMINATION:  GENERAL: A pleasant, well developed woman in no distress. She was afebrile. Her oxygen saturations were normal.  HEENT: Head normocephalic and atraumatic.  NECK: Supple, without thyromegaly or adenopathy.  LUNGS: Showed diminished breath sounds throughout, but equal and clear. There was no murmur. Her heart rate was regular.  ABDOMEN: Her abdomen was soft and nontender. She had very active bowel sounds. The chest tube and its surrounding area was without erythema. There was no subcutaneous emphysema.  EXTREMITIES: Without tenderness. She had good pulses throughout. She was not tender in her calves.  NEUROLOGIC: She was awake, alert, and oriented.   ASSESSMENT AND PLAN: This patient presented with a spontaneous pneumothorax on the left, presumed secondary to an underlying bronchitis with recent treatment with steroids and antibiotics. Our plan will be to repeat her chest x-ray in the morning. She does not have an air leak at this time, and if she does not have an air leak tomorrow, I would recommend we place the tube to water seal and observe for 24 hours before the tube is removed. Since this is her first episode, I would not recommend  any surgical intervention or further intervention at this time. If re-experiences a pneumothorax, then we should CT scan her chest.   Thank you very much for this consultation. I will be happy to follow the patient along with you, and follow her in the outpatient department upon discharge.    ____________________________ Lew Dawes. Genevive Bi, MD teo:MT D: 07/25/2014 14:52:31  ET T: 07/25/2014 16:46:28 ET JOB#: [id number]  cc: Christia Reading E. Genevive Bi, MD, <Dictator> Louis Matte MD ELECTRONICALLY SIGNED 08/13/2014 9:56

## 2014-10-28 NOTE — Op Note (Signed)
PATIENT NAME:  Joanna Rodriguez, Joanna Rodriguez MR#:  [id number] DATE OF BIRTH:  [birth year]  DATE OF PROCEDURE:  08/14/2014  PREOPERATIVE DIAGNOSIS: Recurrent spontaneous pneumothorax, left side.   POSTOPERATIVE DIAGNOSIS: Recurrent spontaneous pneumothorax, left side.   OPERATION PERFORMED: 1.  Preoperative bronchoscopy to assess endobronchial anatomy.  2.  Left thoracoscopy with talc pleurodesis.   SURGEON: Nestor Lewandowsky, M.D.   ASSISTANT: None.   INDICATION FOR PROCEDURE: Joanna Rodriguez is a 64 year old woman who presented with her second spontaneous pneumothorax on the left side within the last 2 months. Because of the CT scan findings showing severe emphysematous changes throughout all lobes of the lung, there was no one area in particular to resect. Therefore, she was offered a talc pleurodesis for management of her recurrent pneumothorax.   The indications and risks of the procedure were explained to the patient who gave her informed consent.   DESCRIPTION OF PROCEDURE: The patient was brought to the operating suite and placed in the supine position. General endotracheal anesthesia was given with a double-lumen tube. Preoperative bronchoscopy was carried out. This was normal to the subsegmental levels bilaterally. There was no tumor or secretions identified. The patient was then turned for a left thoracoscopy. The left lung was deflated. The chest tube was removed. The patient was then prepped and draped in the usual sterile fashion. A single port site was created in the inferior aspect of the left hemithorax several interspaces below the original chest tube site. The incision was deepened down through the muscles of the chest wall until the pleural space was entered. A scope was then introduced and we could see that there were some filmy adhesions to the apex of the lung which were swept down with a peanut. Thoracoscopy was then carried out. There was no evidence of intrapleural malignancy. There was no  evidence of infection. Then 5 grams of sterile talc was insufflated under direct visualization coating the entire pleural space. A single 28-French chest tube was inserted through a separate stab wound and brought up through our thoracoscopy incision. It was then positioned to the apex of the chest. The thoracoscopy site was then closed with multiple layers of running absorbable sutures and nylon on the skin. The chest tube was secured with #1 silk. The patient tolerated the procedure well and was extubated and taken to the recovery room in stable condition.   ____________________________ Lew Dawes Genevive Bi, MD teo:sb D: 08/14/2014 13:32:28 ET T: 08/14/2014 14:14:59 ET JOB#: [id number]  cc: Christia Reading E. Genevive Bi, MD, <Dictator> Louis Matte MD ELECTRONICALLY SIGNED 08/17/2014 9:53

## 2014-10-28 NOTE — H&P (Signed)
PATIENT NAME:  Joanna Rodriguez, Joanna Rodriguez MR#:  [id number] DATE OF BIRTH:  [birth year]  DATE OF ADMISSION:  08/13/2014  PRIMARY CARE PHYSICIAN: Golden Pop, MD / [facility]  ADMITTING PHYSICIAN: Dia Crawford, MD  CHIEF COMPLAINT: Chest pain, shortness of breath.   BRIEF HISTORY: Ms. Hoke is a 64 year old woman with long-standing history of chronic lung disease, upper respiratory symptoms. She had an upper respiratory infection diagnosed right after Christmas, was seen in the [facility] Emergency Room on 07/24/2014 with a spontaneous left pneumothorax. She was treated with a tube thoracostomy. She did well over several days, discharged home with a fully expanded lung which was confirmed as an  outpatient. This morning she was walking the dog, took a deep breath, had a sudden "pop" in her left chest followed by pain and shortness of breath. She presented back to the Emergency Room where again she has a significant pneumothorax on the left side. She continues to smoke intermittently, although she has been trying to quit for some time. She does have a history of long-standing upper respiratory infection symptoms. She has no history of pneumothorax prior to the previous event.   There is no history of cardiac disease, hypertension or diabetes, but she is hypothyroid. She has a history of chronic herpes disease and Crohn disease, followed by Dr. Verta Ellen. She has been on mercaptopurine since her last evaluation. She has had multiple previous abdominal surgeries including small bowel volvulus apparently unrelated to her Crohn disease. She has a midline ventral wall hernia. She also has had an appendectomy and hysterectomy. She denies any history of hepatitis, yellow jaundice, pancreatitis, peptic ulcer disease, gallbladder disease or diverticulitis.   CURRENT MEDICATIONS: Include acyclovir 40 mg once a day, Synthroid 0.1 mg daily, tramadol 50 mg 3 to 4 times a day p.r.n., and Tussionex for her cough.    ALLERGIES: NONSTEROIDAL ANTI-INFLAMMATORY MEDICINES, which are believed to be related to development of her Crohn disease.   REVIEW OF SYSTEMS: Positive shortness of breath and mild chest pain, but no other GI or GU symptoms are noted. A full 10 point review of systems was undertaken. She does not have any other positives than noted above.   SOCIAL HISTORY: She is currently living at home without significant alcohol history. She is employed and she is attempting to quit smoking.   FAMILY HISTORY Negative with regard to the current condition. She does not have any history of chronic lung disease, but does have family history of hypertension.   PHYSICAL EXAMINATION: GENERAL: She is lying comfortably in bed with some lethargy from her recent medication.  VITAL SIGNS: Blood pressure is 147/82, heart rate 88 and regular, and she is afebrile.  HEENT: No scleral icterus. No pupillary abnormalities. No facial deformities. Normal ears. Normal oral pharynx.  LYMPH: No adenopathy in her neck, axilla or groin.  PULMONARY: Clear but very distant breath sounds with no significant inequality. She has no wheezing or adventitious sounds. Normal pulmonary excursion.  CARDIAC: No murmurs or gallops. She seems to be in normal sinus rhythm.  ABDOMEN: Soft. Midline reducible hernia. Active bowel sounds. No guarding or rebound noted.  MUSCULOSKELETAL: Full range of motion. No deformities in upper and lower extremities.  NEUROLOGIC: No focal defects and normal muscle strength.  PSYCHIATRIC: Normal orientation, normal affect.  DIAGNOSTIC DATA: I have independently reviewed her chest x-ray which does reveal a pneumothorax on the left side, very similar to the pneumothorax from 2 weeks ago. Laboratory values are otherwise unremarkable.  ASSESSMENT AND PLAN: She is admitted to the hospital with a recurrent spontaneous pneumothorax. We will arrange for thoracic surgery consult to discuss other interventions that are  available to her. She is in agreement with this plan.   TOTAL TIME SPENT: 45 minutes.   ____________________________ Rodena Goldmann III, MD rle:sb D: 08/13/2014 10:41:59 ET T: 08/13/2014 11:07:45 ET JOB#: [id number]  cc: Micheline Maze, MD, <Dictator> Guadalupe Maple, MD Rodena Goldmann MD ELECTRONICALLY SIGNED 08/13/2014 17:42

## 2014-10-28 NOTE — Consult Note (Signed)
PATIENT NAME:  Joanna Rodriguez, Joanna Rodriguez MR#:  [id number] DATE OF BIRTH:  18-Jan-1951  DATE OF CONSULTATION:  08/13/2014  REFERRING PHYSICIAN:   CONSULTING PHYSICIAN:  Carmyn Hamm E. Genevive Bi, MD  REQUESTING PHYSICIAN: Bronson Ing, MD.   REASON FOR CONSULTATION: Recurrent left-sided pneumothorax.   HISTORY OF PRESENT ILLNESS:  I have personally seen and examined Joanna Rodriguez. I have independently reviewed her chart and her films. I have discussed her care with Dr. Bronson Ing. One half hour consultation time was spent, more than half of which was in counseling and coordination of care.   Joanna Rodriguez is a 64 year old female who presented to the Emergency Room today with increasing shortness of breath and left-sided chest pain. This occurred acutely after she was walking her dog. She was admitted to the hospital several weeks ago with similar symptoms and was found to have a left-sided pneumothorax managed with a chest tube. She was seen after her chest tube was removed in the outpatient department where her lung was completely expanded and she was asymptomatic. Today when she presented to the Emergency Department her left lung was again collapsed showing approximately a 30-40% pneumothorax. She had a chest tube inserted with prompt re-expansion of the lung. There is a small to moderate sized air leak.   Joanna Rodriguez continues to smoke. She has smoked almost all of her life. She states that she has quit at least a dozen times, but currently is smoking. She has not had any fevers or chills. She has not had any hemoptysis or weight loss. She denies any sputum production.   PHYSICAL EXAMINATION:  CHEST: She has a left-sided chest tube in place with a small to moderate air leak. There is no surrounding skin erythema, although the dressing is still intact. Her lungs are diminished on the left side, but clear.  HEART: Regular.  ABDOMEN: Soft and nontender. There are no palpable masses. There was no hepatosplenomegaly.    I have independently reviewed the patient's chest x-rays and CT scans. The CT scan shows diffuse emphysematous changes without any obvious mass, tumor, or pneumonia. The tube is within the fissure on the left side.   I did discuss her care with Dr. Gaylyn Cheers regarding her 6-mercaptopurine. He does not believe this will impair her planned pleurodesis.   I spent a long time with her today reviewing the indications and risks of thoracoscopy with talc insufflation. I told her I thought that given the second spontaneous pneumothorax has occurred within the last month that it would be reasonable to proceed with surgery. She understands that with the diffuse emphysema there is no way to remove the actual underlying emphysematous blebs. I reviewed with her the options including the risks. Risks of bleeding, infection, recurrence, and death were all reviewed. She would like [place] to proceed.   We have tentatively planned her surgery for tomorrow.   Thank you very much for your kind referral.    ____________________________ Lew Dawes. Genevive Bi, MD teo:bu D: 08/13/2014 15:21:43 ET T: 08/13/2014 15:57:04 ET JOB#: [id number]  cc: Christia Reading E. Genevive Bi, MD, <Dictator> Louis Matte MD ELECTRONICALLY SIGNED 08/14/2014 7:51

## 2014-10-28 NOTE — Discharge Summary (Signed)
PATIENT NAME:  Joanna Rodriguez, BO MR#:  [id number] DATE OF BIRTH:  03/31/1951  DATE OF ADMISSION:  07/24/2014 DATE OF DISCHARGE:  07/27/2014  BRIEF HISTORY: Joi Leyva is a 64 year old woman seen in the Emergency Room with a fairly large left pneumothorax. She has had marked problems with shortness of breath and upper respiratory infections for almost a month. She presented to the Emergency Room because she had increasing shortness of breath after evaluation at the [facility] primary care office in [place]. Chest x-ray revealed a significant pneumothorax. A chest tube was placed by the Emergency Room physician which accomplished almost complete re-expansion. She was admitted to hospital, maintained on suction, seen by the thoracic surgery service. Her air leak resolved. The tube was removed on the 29th and she was discharged home for followup in 1 week's time. Bathing, activity, and driving instructions were given to the patient.   DISCHARGE MEDICATIONS: She is to resume her home medications which include acyclovir 400 mg once a day, Synthroid 100 mcg once a day, tramadol 50 mg t.i.d., and mercaptopurine 50 mg tablets 1-1/2 tablets once a day.   FINAL DISCHARGE DIAGNOSIS: Upper respiratory infection with spontaneous pneumothorax.   SURGERY: Tube thoracostomy.  ____________________________ Micheline Maze, MD rle:sb D: 07/31/2014 09:18:33 ET T: 07/31/2014 11:06:50 ET JOB#: [id number]  cc: Rodena Goldmann III, MD, <Dictator> Timothy E. Genevive Bi, MD Herbon E. Raul Del, [place] MD ELECTRONICALLY SIGNED 08/01/2014 11:56

## 2014-11-06 ENCOUNTER — Telehealth: Payer: Self-pay | Admitting: Internal Medicine

## 2014-11-06 MED ORDER — FLUTICASONE-SALMETEROL 500-50 MCG/DOSE IN AEPB: 1.0000 | INHALATION_SPRAY | Freq: Two times a day (BID) | RESPIRATORY_TRACT | Status: DC

## 2014-11-06 NOTE — Telephone Encounter (Signed)
Spoke with pt, states she was told by VM to take her advair 1 puff bid but med was sent in as 1 puff daily.  This needs to be resent to pharmacy with correct sig.  This has been done.  Nothing further needed.

## 2014-11-09 ENCOUNTER — Ambulatory Visit
Admission: RE | Admit: 2014-11-09 | Discharge: 2014-11-09 | Disposition: A | Discharge status: Home / Self Care | Payer: Commercial Managed Care - HMO | Source: Ambulatory Visit | Attending: Family Medicine | Admitting: Family Medicine

## 2014-11-09 ENCOUNTER — Other Ambulatory Visit: Payer: Self-pay

## 2014-11-09 DIAGNOSIS — Z1231 Encounter for screening mammogram for malignant neoplasm of breast: Secondary | ICD-10-CM | POA: Diagnosis not present

## 2014-11-09 LAB — HM MAMMOGRAPHY

## 2014-11-20 DIAGNOSIS — I10 Essential (primary) hypertension: Secondary | ICD-10-CM | POA: Diagnosis not present

## 2014-11-23 DIAGNOSIS — R748 Abnormal levels of other serum enzymes: Secondary | ICD-10-CM | POA: Diagnosis not present

## 2015-01-08 ENCOUNTER — Other Ambulatory Visit: Payer: Self-pay | Admitting: Family Medicine

## 2015-01-08 MED ORDER — LISINOPRIL 5 MG PO TABS: 5.0000 mg | ORAL_TABLET | Freq: Every day | ORAL | Status: DC

## 2015-01-08 MED ORDER — ACYCLOVIR 400 MG PO TABS: 400.0000 mg | ORAL_TABLET | Freq: Every day | ORAL | Status: DC

## 2015-01-08 MED ORDER — LEVOTHYROXINE SODIUM 100 MCG PO TABS: 100.0000 ug | ORAL_TABLET | Freq: Every day | ORAL | Status: DC

## 2015-01-08 NOTE — Addendum Note (Signed)
Addended by: Gerrit Halls L on: 01/08/2015 11:57 AM   Modules accepted: Orders

## 2015-01-08 NOTE — Telephone Encounter (Signed)
Rx Refill Request Received from:Patient  Medication: Acyclovir, Levothyroxine, Lisinopril Last Seen:11/20/14 Next Due: 47month Last Prescription:10/24/14 has current scripts in PP, unable to send Order Placed please review, sign and send  [place]

## 2015-01-08 NOTE — Telephone Encounter (Signed)
Pt called and would like to have levothyroxine, acyclovir and lisinopril sent to [facility] gold mailing pharmacy.

## 2015-01-11 ENCOUNTER — Other Ambulatory Visit: Payer: Self-pay

## 2015-01-11 DIAGNOSIS — K50919 Crohn's disease, unspecified, with unspecified complications: Secondary | ICD-10-CM

## 2015-01-11 MED ORDER — MERCAPTOPURINE 50 MG PO TABS: 50.0000 mg | ORAL_TABLET | Freq: Every day | ORAL | Status: DC

## 2015-01-22 ENCOUNTER — Ambulatory Visit: Payer: Commercial Managed Care - HMO | Admitting: Internal Medicine

## 2015-01-23 ENCOUNTER — Ambulatory Visit (INDEPENDENT_AMBULATORY_CARE_PROVIDER_SITE_OTHER): Payer: Commercial Managed Care - HMO | Admitting: Internal Medicine

## 2015-01-23 ENCOUNTER — Encounter: Payer: Self-pay | Admitting: Internal Medicine

## 2015-01-23 VITALS — BP 124/60 | HR 76 | Temp 97.4°F | Ht 62.0 in | Wt 131.0 lb

## 2015-01-23 DIAGNOSIS — R0600 Dyspnea, unspecified: Secondary | ICD-10-CM | POA: Diagnosis not present

## 2015-01-23 DIAGNOSIS — J431 Panlobular emphysema: Secondary | ICD-10-CM | POA: Diagnosis not present

## 2015-01-23 DIAGNOSIS — Z716 Tobacco abuse counseling: Secondary | ICD-10-CM

## 2015-01-23 MED ORDER — BECLOMETHASONE DIPROPIONATE 80 MCG/ACT IN AERS: 1.0000 | INHALATION_SPRAY | Freq: Two times a day (BID) | RESPIRATORY_TRACT | Status: DC

## 2015-01-23 NOTE — Assessment & Plan Note (Signed)
Clinically patient with for COPD, and with obstruction noted on her pulmonary function testing. Patient was educated on pneumothorax and COPD, she was noted to have 1 bullae during her recent hospitalization, but with severe emphysema noted on a CAT scan she is at increased risk for developing more bullae in the future (especially she resumes smoking).  She states she's back to baseline breathing currently, walking about 10-15 miles per day, and has stopped smoking completely. Is having moderate to severe reflux in the evenings with Advair. We'll stop Advair and start Qvar as part of stepdown therapy.  Plan: - Finish current dose of Advair, and then start Qvar 80 g 1 puff twice a day gargle and rinse after each use - Spiriva Respimat - 2 puff in the mornings only - rinse and gargle after each use - Continue to  avoid tobacco.

## 2015-01-23 NOTE — Assessment & Plan Note (Signed)
Multifactorial: COPD, deconditioning, pneumothorax, restricted chest wall mechanics secondary to pain and recent thoraoscopy with pleurodesis  Improving greatly, patient states she is walking a moderate pace about 10-15 miles per day, per her pedometer. We'll continue with COPD management, diet, exercise.  Plan: -Exercise as tolerated, advance slowly. -Advised patient to continue with incentive spirometry on a daily basis -Avoid tobacco

## 2015-01-23 NOTE — Assessment & Plan Note (Signed)
Tobacco Cessation - Counseling regarding benefits of smoking cessation strategies was provided for more than 12 min. - Educated that at this time smoking- cessation represents the single most important step that patient can take to enhance the length and quality of live. - Educated patient regarding alternatives of behavior interventions, pharmacotherapy including NRT and non-nicotine therapy such, and combinations of both. - Patient at this time: has quit

## 2015-01-23 NOTE — Patient Instructions (Signed)
Follow up with Dr. Stevenson Clinch in 6 months - finish your current dose of Advair 500/50, then stop - start Qvar 37mg (1 puff in the am and 1 puff in the PM) after your finish your current advair dose - remember to gargle and rinse after each use - if you continue to have reflux symptoms, then call [place] we might have to call in an anti-reflux medication for you. - cont to avoid tobacco.  - cont with spiriva

## 2015-01-23 NOTE — Addendum Note (Signed)
Addended by: Devona Konig on: 01/23/2015 03:15 PM   Modules accepted: Orders

## 2015-01-23 NOTE — Progress Notes (Signed)
MRN# [id number] Joanna Rodriguez Jul 28, 1950   CC: Chief Complaint  Patient presents with  . Follow-up    Pt using both Advair and Spiriva; pt back to her baseline and feels good. She is having awful acid reflux after using inhalers.      Brief History: 09/03/14 This is a pleasant 64 year old female referred by Dr. Durwin Nora for COPD evaluation and workup along with episodes of 2 spontaneous pneumothorax in the last 2 months. Below is her smoking and history of COPD: - 1 episode of bronchitis per year, not every year - No intubations prior to recent hospitalization - No childhood asthma - Pet dog at home - Quit smoking Nov 2015 - No seasonal allergies - Left chest wall tenderness - Still with DOE - can walk to mailbox, or about 3 car lenghts  Patient stated that in Nov 2015 she had a bad "bronchitis" for which he continued to coughing spells and sob, in Jan 2015 had significant SOB, CXR showed L pneumothorax. Had chest tube placed at time, then in Feb 2015, had a another spontaneous pneumothorax which required chest tube placed along with talc pleurodesis.  During per talc pleurodesis procedure Dr. Genevive Bi noted a single bleb on the left side.  Currently patient states that she is doing well, still with shortness of breath and cough, but overall with improvement.  She is currently not smoking  PLAN - stop tobacco, pfts, 38mt  ROV 09/2014 Presents today for a follow up visit. Patient with a past medical history spontaneous pneumothorax x2 status post thoracotomy and chest tube placement. Still with intermittent episodes of SOB, currently not on any inhalers. No recent ED\Urgent care visits. Currently not smoking.  Plan : cont advair\spirvia, diet, exercise  Events since last clinic visit: Patient presents for a follow-up visit today. Patient with a past medical history spontaneous pneumothorax 2, status post thoracotomy and chest tube placement by Dr. OGenevive Bi   Since her  last visit she says that she has completely stopped smoking, she is increased but walking to about 10-15 miles per day, which she is measuring on a pedometer. Patient states also since being on Advair and Spiriva, she has had significant reflux especially in the evenings, has not tried any over-the-counter antireflux medications yet. Overall patient states that her dyspnea has improved, she is back to her baseline breathing, and she is more active. She states she has a mild intermittent dry cough that is not bothersome at this time.     Medication:   Current Outpatient Rx  Name  Route  Sig  Dispense  Refill  . acyclovir (ZOVIRAX) 400 MG tablet   Oral   Take 1 tablet (400 mg total) by mouth daily.   90 tablet   3   . aspirin EC 81 MG tablet   Oral   Take 81 mg by mouth daily.         . B Complex-Folic Acid (B COMPLEX-VITAMIN B12 PO)   Oral   Take 1 tablet by mouth daily.         . Calcium-Vitamin D 600-200 MG-UNIT per tablet   Oral   Take 1 tablet by mouth daily.         .Marland KitchenCo-Enzyme Q-10 30 MG CAPS   Oral   Take 1 capsule by mouth daily.         . Fluticasone-Salmeterol (ADVAIR DISKUS) 500-50 MCG/DOSE AEPB   Inhalation   Inhale 1 puff into the lungs 2 (two) times daily.  60 each   3   . Glucosamine-Chondroitin 500-400 MG CAPS   Oral   Take 2 tablets by mouth daily.         Marland Kitchen levothyroxine (SYNTHROID, LEVOTHROID) 100 MCG tablet   Oral   Take 1 tablet (100 mcg total) by mouth daily.   90 tablet   3   . lisinopril (PRINIVIL,ZESTRIL) 5 MG tablet   Oral   Take 1 tablet (5 mg total) by mouth daily.   90 tablet   1   . Melatonin 5 MG TABS   Oral   Take 5 mg by mouth at bedtime as needed.         . mercaptopurine (PURINETHOL) 50 MG tablet   Oral   Take 1 tablet (50 mg total) by mouth daily. Take 1.5 tablets daily   60 tablet   3   . Multiple Vitamins-Minerals (MULTIVITAMIN WITH MINERALS) tablet   Oral   Take 1 tablet by mouth daily.         .  Omega-3 Fatty Acids (FISH OIL) 1000 MG CAPS   Oral   Take 1,000 mg by mouth daily.         . Tiotropium Bromide Monohydrate (SPIRIVA RESPIMAT) 2.5 MCG/ACT AERS   Inhalation   Inhale 2 puffs into the lungs daily. Gargle and rinse after each use   4 g   3   . EXPIRED: Tiotropium Bromide Monohydrate (SPIRIVA RESPIMAT) 2.5 MCG/ACT AERS   Inhalation   Inhale 2 puffs into the lungs daily.   1 Inhaler   0   . traMADol (ULTRAM) 50 MG tablet   Oral   Take 50 mg by mouth every 4 (four) hours as needed.            Review of Systems: Gen:  Denies  fever, sweats, chills HEENT: Denies blurred vision, double vision, ear pain, eye pain, hearing loss, nose bleeds, sore throat Cvc:  No dizziness, chest pain or heaviness Resp:   Admits to: Mild dry cough Gi: Denies swallowing difficulty, stomach pain, nausea or vomiting, diarrhea, constipation, bowel incontinence Gu:  Denies bladder incontinence, burning urine Ext:   No Joint pain, stiffness or swelling Skin: No skin rash, easy bruising or bleeding or hives Endoc:  No polyuria, polydipsia , polyphagia or weight change Other:  All other systems negative  Allergies:  Nsaids  Physical Examination:  VS: There were no vitals taken for this visit.  General Appearance: No distress  HEENT: PERRLA, no ptosis, no other lesions noticed Pulmonary:normal breath sounds., diaphragmatic excursion normal.No wheezing, No rales   Cardiovascular:  Normal S1,S2.  No m/r/g.     Abdomen:Exam: Benign, Soft, non-tender, No masses  Skin:   warm, no rashes, no ecchymosis  Extremities: normal, no cyanosis, clubbing, warm with normal capillary refill.     Assessment and Plan: 64 year old female past medical history of moderate COPD, pulmonary bullae, pneumothorax, status post thorascopy presented for follow-up visit COPD (chronic obstructive pulmonary disease) Clinically patient with for COPD, and with obstruction noted on her pulmonary function  testing. Patient was educated on pneumothorax and COPD, she was noted to have 1 bullae during her recent hospitalization, but with severe emphysema noted on a CAT scan she is at increased risk for developing more bullae in the future (especially she resumes smoking).  She states she's back to baseline breathing currently, walking about 10-15 miles per day, and has stopped smoking completely. Is having moderate to severe reflux in the evenings with Advair.  We'll stop Advair and start Qvar as part of stepdown therapy.  Plan: - Finish current dose of Advair, and then start Qvar 80 g 1 puff twice a day gargle and rinse after each use - Spiriva Respimat - 2 puff in the mornings only - rinse and gargle after each use - Continue to  avoid tobacco.      Dyspnea Multifactorial: COPD, deconditioning, pneumothorax, restricted chest wall mechanics secondary to pain and recent thoraoscopy with pleurodesis  Improving greatly, patient states she is walking a moderate pace about 10-15 miles per day, per her pedometer. We'll continue with COPD management, diet, exercise.  Plan: -Exercise as tolerated, advance slowly. -Advised patient to continue with incentive spirometry on a daily basis -Avoid tobacco      Tobacco abuse counseling Tobacco Cessation - Counseling regarding benefits of smoking cessation strategies was provided for more than 12 min. - Educated that at this time smoking- cessation represents the single most important step that patient can take to enhance the length and quality of live. - Educated patient regarding alternatives of behavior interventions, pharmacotherapy including NRT and non-nicotine therapy such, and combinations of both. - Patient at this time: has quit         Updated Medication List Outpatient Encounter Prescriptions as of 01/23/2015  Medication Sig  . acyclovir (ZOVIRAX) 400 MG tablet Take 1 tablet (400 mg total) by mouth daily.  Marland Kitchen aspirin EC 81 MG  tablet Take 81 mg by mouth daily.  . B Complex-Folic Acid (B COMPLEX-VITAMIN B12 PO) Take 1 tablet by mouth daily.  . Calcium-Vitamin D 600-200 MG-UNIT per tablet Take 1 tablet by mouth daily.  Marland Kitchen Co-Enzyme Q-10 30 MG CAPS Take 1 capsule by mouth daily.  . Fluticasone-Salmeterol (ADVAIR DISKUS) 500-50 MCG/DOSE AEPB Inhale 1 puff into the lungs 2 (two) times daily.  . Glucosamine-Chondroitin 500-400 MG CAPS Take 2 tablets by mouth daily.  Marland Kitchen levothyroxine (SYNTHROID, LEVOTHROID) 100 MCG tablet Take 1 tablet (100 mcg total) by mouth daily.  Marland Kitchen lisinopril (PRINIVIL,ZESTRIL) 5 MG tablet Take 1 tablet (5 mg total) by mouth daily.  . Melatonin 5 MG TABS Take 5 mg by mouth at bedtime as needed.  . mercaptopurine (PURINETHOL) 50 MG tablet Take 1 tablet (50 mg total) by mouth daily. Take 1.5 tablets daily (Patient taking differently: Take 1.5 mg/kg by mouth daily. Take 1.5 tablets daily)  . Multiple Vitamins-Minerals (MULTIVITAMIN WITH MINERALS) tablet Take 1 tablet by mouth daily.  . Omega-3 Fatty Acids (FISH OIL) 1000 MG CAPS Take 1,000 mg by mouth daily.  . Tiotropium Bromide Monohydrate (SPIRIVA RESPIMAT) 2.5 MCG/ACT AERS Inhale 2 puffs into the lungs daily. Gargle and rinse after each use  . traMADol (ULTRAM) 50 MG tablet Take 50 mg by mouth every 4 (four) hours as needed.  . Alum & Mag Hydroxide-Simeth (ANTACID ANTI-GAS MAX STRENGTH PO) Take 1 each by mouth daily.  . Multiple Vitamins-Minerals (MULTIVITAMIN & MINERAL PO) Take 1 tablet by mouth daily.  . [DISCONTINUED] Tiotropium Bromide Monohydrate (SPIRIVA RESPIMAT) 2.5 MCG/ACT AERS Inhale 2 puffs into the lungs daily.   No facility-administered encounter medications on file as of 01/23/2015.    Orders for this visit: No orders of the defined types were placed in this encounter.    Thank  you for the visitation and for allowing  [facility] to assist in the care of your patient. Our recommendations are noted above.  Please  contact us if we can be of further service.  Vilinda Boehringer, MD [facility] Office Number: [phone]

## 2015-01-23 NOTE — Addendum Note (Signed)
Addended by: Devona Konig on: 01/23/2015 03:13 PM   Modules accepted: Orders

## 2015-03-01 ENCOUNTER — Telehealth: Payer: Self-pay | Admitting: Gastroenterology

## 2015-03-01 DIAGNOSIS — K50919 Crohn's disease, unspecified, with unspecified complications: Secondary | ICD-10-CM

## 2015-03-01 NOTE — Telephone Encounter (Signed)
Returned patient call. Patient states that she has a 2 week supply of medications she just needs the prescriptions sent to her mail in pharmacy. Informed patient that Dr Allen Norris will be back in the office on 03/06/15 and will review this information. Patient we await call from office next week.

## 2015-03-01 NOTE — Telephone Encounter (Signed)
Please call patient about having her prescription of Mercaptopurine and Tramadol refilled by Dr Allen Norris. Thanks.

## 2015-03-05 MED ORDER — MERCAPTOPURINE 50 MG PO TABS: 50.0000 mg | ORAL_TABLET | Freq: Every day | ORAL | Status: DC

## 2015-03-05 NOTE — Telephone Encounter (Signed)
I was able to send a refill on patient's Mercaptopurine for six months but not her Tramadol. Can you please print it out and to be signed by Dr. Allen Norris. He agreed in giving her a refill on her Tramadol for 6 months. Please call patient once you have the prescription ready to be picked up or fax it at [phone] [facility] mail orders given by patient. I also ordered her labs that he requested (LFTS, CBC, and CMP). I told patient to go to the [facility] lab to have it drawn. Patient understood.

## 2015-03-06 ENCOUNTER — Telehealth: Payer: Self-pay | Admitting: Internal Medicine

## 2015-03-06 ENCOUNTER — Other Ambulatory Visit: Payer: Self-pay | Admitting: Internal Medicine

## 2015-03-06 ENCOUNTER — Telehealth: Payer: Self-pay | Admitting: Gastroenterology

## 2015-03-06 ENCOUNTER — Other Ambulatory Visit: Payer: Self-pay

## 2015-03-06 DIAGNOSIS — A6000 Herpesviral infection of urogenital system, unspecified: Secondary | ICD-10-CM | POA: Insufficient documentation

## 2015-03-06 DIAGNOSIS — E079 Disorder of thyroid, unspecified: Secondary | ICD-10-CM | POA: Insufficient documentation

## 2015-03-06 DIAGNOSIS — E538 Deficiency of other specified B group vitamins: Secondary | ICD-10-CM | POA: Insufficient documentation

## 2015-03-06 DIAGNOSIS — K50919 Crohn's disease, unspecified, with unspecified complications: Secondary | ICD-10-CM

## 2015-03-06 DIAGNOSIS — K5010 Crohn's disease of large intestine without complications: Secondary | ICD-10-CM | POA: Insufficient documentation

## 2015-03-06 MED ORDER — MERCAPTOPURINE 50 MG PO TABS: 50.0000 mg | ORAL_TABLET | Freq: Every day | ORAL | Status: DC

## 2015-03-06 MED ORDER — TRAMADOL HCL 50 MG PO TABS: 50.0000 mg | ORAL_TABLET | Freq: Three times a day (TID) | ORAL | Status: DC

## 2015-03-06 NOTE — Telephone Encounter (Signed)
Called and spoke pt. Pt states she has been using Qvar for 2 weeks and has noticed an increase in dry cough, acid reflux and a stuffy nose, all s/s seem to worsen at night. Pt requesting recs from Dr. Stevenson Clinch.   Dr. Stevenson Clinch please advise.

## 2015-03-06 NOTE — Telephone Encounter (Signed)
Patient can stop Qvar. Continue with Spiriva.

## 2015-03-06 NOTE — Telephone Encounter (Signed)
Question regarding a RX that was called in

## 2015-03-06 NOTE — Telephone Encounter (Signed)
Contacted pt to let her know the rx for Tramadol has been sent to her mail order pharmacy.

## 2015-03-06 NOTE — Telephone Encounter (Signed)
Pt called stating the rx that was sent yesterday (Mercaptopurine) was only sent for 30 days. 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 I resent the rx for a 90 day supply.

## 2015-03-07 ENCOUNTER — Encounter: Payer: Self-pay | Admitting: Gastroenterology

## 2015-03-07 ENCOUNTER — Other Ambulatory Visit
Admission: RE | Admit: 2015-03-07 | Discharge: 2015-03-07 | Disposition: A | Discharge status: Home / Self Care | Payer: Commercial Managed Care - HMO | Source: Ambulatory Visit | Attending: Gastroenterology | Admitting: Gastroenterology

## 2015-03-07 DIAGNOSIS — K50919 Crohn's disease, unspecified, with unspecified complications: Secondary | ICD-10-CM

## 2015-03-07 LAB — CBC WITH DIFFERENTIAL/PLATELET
Basophils Absolute: 0.1 10*3/uL (ref 0–0.1)
Basophils Relative: 1 %
EOS PCT: 2 %
Eosinophils Absolute: 0.1 10*3/uL (ref 0–0.7)
HEMATOCRIT: 38.1 % (ref 35.0–47.0)
Hemoglobin: 13.1 g/dL (ref 12.0–16.0)
LYMPHS ABS: 1.4 10*3/uL (ref 1.0–3.6)
LYMPHS PCT: 29 %
MCH: 35.1 pg — AB (ref 26.0–34.0)
MCHC: 34.3 g/dL (ref 32.0–36.0)
MCV: 102.4 fL — AB (ref 80.0–100.0)
MONO ABS: 0.5 10*3/uL (ref 0.2–0.9)
Monocytes Relative: 10 %
Neutro Abs: 2.9 10*3/uL (ref 1.4–6.5)
Neutrophils Relative %: 58 %
PLATELETS: 358 10*3/uL (ref 150–440)
RBC: 3.72 MIL/uL — AB (ref 3.80–5.20)
RDW: 16.2 % — ABNORMAL HIGH (ref 11.5–14.5)
WBC: 5 10*3/uL (ref 3.6–11.0)

## 2015-03-07 LAB — COMPREHENSIVE METABOLIC PANEL
ALT: 48 U/L (ref 14–54)
AST: 35 U/L (ref 15–41)
Albumin: 3.6 g/dL (ref 3.5–5.0)
Alkaline Phosphatase: 48 U/L (ref 38–126)
Anion gap: 6 (ref 5–15)
BUN: 18 mg/dL (ref 6–20)
CO2: 31 mmol/L (ref 22–32)
Calcium: 9.5 mg/dL (ref 8.9–10.3)
Chloride: 105 mmol/L (ref 101–111)
Creatinine, Ser: 0.88 mg/dL (ref 0.44–1.00)
GFR calc Af Amer: >60 mL/min (ref >60)
GFR calc non Af Amer: >60 mL/min (ref >60)
Glucose, Bld: 96 mg/dL (ref 65–99)
Potassium: 4.6 mmol/L (ref 3.5–5.1)
Sodium: 142 mmol/L (ref 135–145)
Total Bilirubin: 0.5 mg/dL (ref 0.3–1.2)
Total Protein: 6.9 g/dL (ref 6.5–8.1)

## 2015-03-07 MED ORDER — TIOTROPIUM BROMIDE MONOHYDRATE 2.5 MCG/ACT IN AERS: 2.0000 | INHALATION_SPRAY | Freq: Every day | RESPIRATORY_TRACT | Status: DC

## 2015-03-07 NOTE — Telephone Encounter (Signed)
Patient notified. Refills of Spiriva sent to pharmacy. Nothing further needed.

## 2015-03-08 ENCOUNTER — Telehealth: Payer: Self-pay

## 2015-03-08 NOTE — Telephone Encounter (Signed)
-----   Message from Lucilla Lame, MD sent at 03/07/2015  4:10 PM EDT ----- Let the patient know that her labs were normal.

## 2015-03-08 NOTE — Telephone Encounter (Signed)
Pt notified of results

## 2015-03-09 ENCOUNTER — Other Ambulatory Visit: Payer: Self-pay | Admitting: Internal Medicine

## 2015-03-11 ENCOUNTER — Telehealth: Payer: Self-pay | Admitting: Internal Medicine

## 2015-03-11 ENCOUNTER — Telehealth: Payer: Self-pay | Admitting: Gastroenterology

## 2015-03-11 MED ORDER — TIOTROPIUM BROMIDE MONOHYDRATE 2.5 MCG/ACT IN AERS
2.0000 | INHALATION_SPRAY | Freq: Every day | RESPIRATORY_TRACT | Status: DC
Start: 2015-03-11 — End: 2015-08-26

## 2015-03-11 NOTE — Telephone Encounter (Signed)
Needs 90 day instead of 60 day supply. Pt requested this due to price

## 2015-03-11 NOTE — Telephone Encounter (Signed)
Called pt. She cancelled RX through [facility] bc it was too expensive. She wants [place] to send RX for spiriva to [facility]. I have done so. Nothing further needed

## 2015-03-12 NOTE — Telephone Encounter (Signed)
Notified pt medication was sent in for 90 days. Pt stated [employer] contacted her last night and things are okay. Medication shipping out today.

## 2015-05-20 ENCOUNTER — Ambulatory Visit (INDEPENDENT_AMBULATORY_CARE_PROVIDER_SITE_OTHER): Payer: Commercial Managed Care - HMO | Admitting: Family Medicine

## 2015-05-20 ENCOUNTER — Encounter: Payer: Self-pay | Admitting: Family Medicine

## 2015-05-20 VITALS — BP 138/72 | HR 84 | Temp 98.0°F | Ht 59.3 in | Wt 133.0 lb

## 2015-05-20 DIAGNOSIS — R252 Cramp and spasm: Secondary | ICD-10-CM

## 2015-05-20 DIAGNOSIS — Z1159 Encounter for screening for other viral diseases: Secondary | ICD-10-CM

## 2015-05-20 DIAGNOSIS — R002 Palpitations: Secondary | ICD-10-CM | POA: Diagnosis not present

## 2015-05-20 DIAGNOSIS — E039 Hypothyroidism, unspecified: Secondary | ICD-10-CM | POA: Diagnosis not present

## 2015-05-20 DIAGNOSIS — I10 Essential (primary) hypertension: Secondary | ICD-10-CM

## 2015-05-20 LAB — MICROALBUMIN, URINE WAIVED
Creatinine, Urine Waived: 200 mg/dL (ref 10–300)
MICROALB, UR WAIVED: 80 mg/L — AB (ref 0–19)

## 2015-05-20 NOTE — Assessment & Plan Note (Signed)
Better on recheck. Continue current regimen. Continue to monitor. Checking BMP and microalbumin today.

## 2015-05-20 NOTE — Progress Notes (Signed)
BP 138/72 mmHg  Pulse 84  Temp(Src) 98 F (36.7 C)  Ht 4' 11.3" (1.506 m)  Wt 133 lb (60.328 kg)  BMI 26.60 kg/m2  SpO2 97%   Subjective:    Patient ID: Joanna Rodriguez, female    DOB: 05-20-1951, 64 y.o.   MRN: [id number]  HPI: Joanna Rodriguez is a 64 y.o. female  Chief Complaint  Patient presents with  . Hypertension    Patient states that her heart is beating abnormally  . Leg cramps   HYPERTENSION- didn't take BP medicine yet this AM Hypertension status: stable  Satisfied with current treatment? yes Duration of hypertension: chronic BP monitoring frequency:  not checking BP medication side effects:  no Medication compliance: good compliance Aspirin: no Recurrent headaches: yes Visual changes: no Palpitations: yes Dyspnea: no Chest pain: no Lower extremity edema: no  Dizzy/lightheaded: yes  LEG CRAMPS Duration: more frequent over the past couple of months Pain: yes Severity: severe  Quality:  squeezing, sharp and cramping Location:  thighs and calf Bilateral:  no- on the R side only Onset: sudden Frequency: a few times a week Time of  day:   night time Sudden unintentional leg jerking:   yes Paresthesias:   yes Decreased sensation:  no Weakness:   no Insomnia:   yes Fatigue:   yes Alleviating factors: nothing/time Aggravating factors: walking or stomping Status: worse Treatments attempted: none  PALPITATIONS Duration: Has been going on for years, less since she stopped smoking Symptom description: heart stopping and then beats quickly Duration of episode: about an hour Frequency: a couple times a month Activity when event occurred: resting Related to exertion: no Dyspnea: no Chest pain: no Syncope: no Anxiety/stress: yes Nausea/vomiting: no Diaphoresis: no Coronary artery disease: no Congestive heart failure: no Arrhythmia: no  Thyroid disease: yes Caffeine intake: occasional Status: better today Treatments attempted:  no  Relevant past medical, surgical, family and social history reviewed and updated as indicated. Interim medical history since our last visit reviewed. Allergies and medications reviewed and updated.  Review of Systems  Constitutional: Negative.   Respiratory: Negative for apnea, cough, choking, chest tightness, shortness of breath, wheezing and stridor.   Cardiovascular: Positive for palpitations. Negative for chest pain and leg swelling.  Musculoskeletal: Positive for myalgias. Negative for back pain, joint swelling, arthralgias, gait problem, neck pain and neck stiffness.  Skin: Negative.   Psychiatric/Behavioral: Negative.     Per HPI unless specifically indicated above     Objective:    BP 138/72 mmHg  Pulse 84  Temp(Src) 98 F (36.7 C)  Ht 4' 11.3" (1.506 m)  Wt 133 lb (60.328 kg)  BMI 26.60 kg/m2  SpO2 97%  Wt Readings from Last 3 Encounters:  05/20/15 133 lb (60.328 kg)  11/20/14 135 lb (61.236 kg)  01/23/15 131 lb (59.421 kg)    Physical Exam  Constitutional: She is oriented to person, place, and time. She appears well-developed and well-nourished. No distress.  HENT:  Head: Normocephalic and atraumatic.  Right Ear: Hearing normal.  Left Ear: Hearing normal.  Nose: Nose normal.  Eyes: Conjunctivae and lids are normal. Right eye exhibits no discharge. Left eye exhibits no discharge. No scleral icterus.  Cardiovascular: Normal rate, regular rhythm, normal heart sounds and intact distal pulses.  Exam reveals no gallop and no friction rub.   No murmur heard. Pulmonary/Chest: Effort normal and breath sounds normal. No respiratory distress. She has no wheezes. She has no rales. She exhibits no tenderness.  Musculoskeletal:  Normal range of motion. She exhibits tenderness. She exhibits no edema.  Negative squeeze test, negative homan's slightly tender to palpation of the calves bilaterally  Neurological: She is alert and oriented to person, place, and time.  Skin: Skin  is warm, dry and intact. No rash noted. No erythema. No pallor.  Psychiatric: She has a normal mood and affect. Her speech is normal and behavior is normal. Judgment and thought content normal. Cognition and memory are normal.  Nursing note and vitals reviewed.   Results for orders placed or performed in visit on 05/17/15  HM MAMMOGRAPHY  Result Value Ref Range   HM Mammogram Done at [facility]:   Problem List Items Addressed This Visit      Cardiovascular and Mediastinum   HTN (hypertension) - Primary    Better on recheck. Continue current regimen. Continue to monitor. Checking BMP and microalbumin today.       Relevant Orders   Microalbumin, Urine Waived (Completed)   Basic metabolic panel     Endocrine   Thyroid activity decreased    Given cramps, will check TSH today. Has been under great control on current regimen for years. Continue to monitor.       Relevant Orders   TSH    Other Visit Diagnoses    Palpitations        Never had EKG before. Nothing to compare to. No sign of a. fib. Will check labs and will refer to cardiology for possible event monitor.     Relevant Orders    EKG 12-Lead (Completed)    Ambulatory referral to Cardiology    Need for hepatitis C screening test        Checking labs today. Await results.     Relevant Orders    Hepatitis C Antibody    Cramp of both lower extremities        Will check electrolytes. Possibly RLS- will await results. If normal, will start requip at low dose to see if it helps. Await results. Continue to monitor.     Relevant Orders    Ferritin        Follow up plan: Return in about 4 weeks (around 06/17/2015) for Physical and follow up on symptoms.

## 2015-05-20 NOTE — Assessment & Plan Note (Deleted)
Given cramps, will check TSH today. Has been under great control on current regimen for years. Continue to monitor.

## 2015-05-20 NOTE — Assessment & Plan Note (Signed)
Given cramps, will check TSH today. Has been under great control on current regimen for years. Continue to monitor.

## 2015-05-20 NOTE — Patient Instructions (Signed)
Palpitations A palpitation is the feeling that your heartbeat is irregular or is faster than normal. It may feel like your heart is fluttering or skipping a beat. Palpitations are usually not a serious problem. However, in some cases, you may need further medical evaluation. CAUSES  Palpitations can be caused by:  Smoking.  Caffeine or other stimulants, such as diet pills or energy drinks.  Alcohol.  Stress and anxiety.  Strenuous physical activity.  Fatigue.  Certain medicines.  Heart disease, especially if you have a history of irregular heart rhythms (arrhythmias), such as atrial fibrillation, atrial flutter, or supraventricular tachycardia.  An improperly working pacemaker or defibrillator. DIAGNOSIS  To find the cause of your palpitations, your health care provider will take your medical history and perform a physical exam. Your health care provider may also have you take a test called an ambulatory electrocardiogram (ECG). An ECG records your heartbeat patterns over a 24-hour period. You may also have other tests, such as:  Transthoracic echocardiogram (TTE). During echocardiography, sound waves are used to evaluate how blood flows through your heart.  Transesophageal echocardiogram (TEE).  Cardiac monitoring. This allows your health care provider to monitor your heart rate and rhythm in real time.  Holter monitor. This is a portable device that records your heartbeat and can help diagnose heart arrhythmias. It allows your health care provider to track your heart activity for several days, if needed.  Stress tests by exercise or by giving medicine that makes the heart beat faster. TREATMENT  Treatment of palpitations depends on the cause of your symptoms and can vary greatly. Most cases of palpitations do not require any treatment other than time, relaxation, and monitoring your symptoms. Other causes, such as atrial fibrillation, atrial flutter, or supraventricular  tachycardia, usually require further treatment. HOME CARE INSTRUCTIONS   Avoid:  Caffeinated coffee, tea, soft drinks, diet pills, and energy drinks.  Chocolate.  Alcohol.  Stop smoking if you smoke.  Reduce your stress and anxiety. Things that can help you relax include:  A method of controlling things in your body, such as your heartbeats, with your mind (biofeedback).  Yoga.  Meditation.  Physical activity such as swimming, jogging, or walking.  Get plenty of rest and sleep. SEEK MEDICAL CARE IF:   You continue to have a fast or irregular heartbeat beyond 24 hours.  Your palpitations occur more often. SEEK IMMEDIATE MEDICAL CARE IF:  You have chest pain or shortness of breath.  You have a severe headache.  You feel dizzy or you faint. MAKE SURE YOU:  Understand these instructions.  Will watch your condition.  Will get help right away if you are not doing well or get worse.   This information is not intended to replace advice given to you by your health care provider. Make sure you discuss any questions you have with your health care provider.   Document Released: 06/12/2000 Document Revised: 06/20/2013 Document Reviewed: 08/14/2011 Elsevier Interactive Patient Education 2016 Elsevier Inc. Leg Cramps Leg cramps occur when a muscle or muscles tighten and you have no control over this tightening (involuntary muscle contraction). Muscle cramps can develop in any muscle, but the most common place is in the calf muscles of the leg. Those cramps can occur during exercise or when you are at rest. Leg cramps are painful, and they may last for a few seconds to a few minutes. Cramps may return several times before they finally stop. Usually, leg cramps are not caused by a serious medical  problem. In many cases, the cause is not known. Some common causes include:  Overexertion.  Overuse from repetitive motions, or doing the same thing over and over.  Remaining in a  certain position for a long period of time.  Improper preparation, form, or technique while performing a sport or an activity.  Dehydration.  Injury.  Side effects of some medicines.  Abnormally low levels of the salts and ions in your blood (electrolytes), especially potassium and calcium. These levels could be low if you are taking water pills (diuretics) or if you are pregnant. HOME CARE INSTRUCTIONS Watch your condition for any changes. Taking the following actions may help to lessen any discomfort that you are feeling:  Stay well-hydrated. Drink enough fluid to keep your urine clear or pale yellow.  Try massaging, stretching, and relaxing the affected muscle. Do this for several minutes at a time.  For tight or tense muscles, use a warm towel, heating pad, or hot shower water directed to the affected area.  If you are sore or have pain after a cramp, applying ice to the affected area may relieve discomfort.  Put ice in a plastic bag.  Place a towel between your skin and the bag.  Leave the ice on for 20 minutes, 2-3 times per day.  Avoid strenuous exercise for several days if you have been having frequent leg cramps.  Make sure that your diet includes the essential minerals for your muscles to work normally.  Take medicines only as directed by your health care provider. SEEK MEDICAL CARE IF:  Your leg cramps get more severe or more frequent, or they do not improve over time.  Your foot becomes cold, numb, or blue.   This information is not intended to replace advice given to you by your health care provider. Make sure you discuss any questions you have with your health care provider.   Document Released: 07/23/2004 Document Revised: 10/30/2014 Document Reviewed: 05/23/2014 Elsevier Interactive Patient Education [employer].

## 2015-05-21 ENCOUNTER — Telehealth: Payer: Self-pay | Admitting: Family Medicine

## 2015-05-21 LAB — BASIC METABOLIC PANEL
BUN / CREAT RATIO: 25 (ref 11–26)
BUN: 17 mg/dL (ref 8–27)
CHLORIDE: 99 mmol/L (ref 97–106)
CO2: 25 mmol/L (ref 18–29)
Calcium: 9.5 mg/dL (ref 8.7–10.3)
Creatinine, Ser: 0.69 mg/dL (ref 0.57–1.00)
GFR calc non Af Amer: 91 mL/min/{1.73_m2} (ref >59)
GFR, EST AFRICAN AMERICAN: 105 mL/min/{1.73_m2} (ref >59)
GLUCOSE: 85 mg/dL (ref 65–99)
POTASSIUM: 4.7 mmol/L (ref 3.5–5.2)
Sodium: 139 mmol/L (ref 136–144)

## 2015-05-21 LAB — HEPATITIS C ANTIBODY: Hep C Virus Ab: <0.1 s/co ratio (ref 0.0–0.9)

## 2015-05-21 LAB — FERRITIN: Ferritin: 39 ng/mL (ref 15–150)

## 2015-05-21 LAB — TSH: TSH: 2.93 u[IU]/mL (ref 0.450–4.500)

## 2015-05-21 NOTE — Telephone Encounter (Signed)
Called to give Joanna Rodriguez her results. They were normal. If she would like we will start requip for RLS. Will call her back in the AM to discuss

## 2015-05-22 MED ORDER — ROPINIROLE HCL 0.25 MG PO TABS: ORAL_TABLET | ORAL | Status: DC

## 2015-05-22 NOTE — Telephone Encounter (Signed)
Spoke to [employer]. Will try requip. Rx sent to her pharmacy.

## 2015-05-22 NOTE — Telephone Encounter (Signed)
Called and [facility] for her to call back.

## 2015-06-06 ENCOUNTER — Encounter: Payer: Self-pay | Admitting: Family Medicine

## 2015-06-17 ENCOUNTER — Ambulatory Visit: Payer: Commercial Managed Care - HMO | Admitting: Family Medicine

## 2015-06-25 ENCOUNTER — Ambulatory Visit (INDEPENDENT_AMBULATORY_CARE_PROVIDER_SITE_OTHER): Payer: Commercial Managed Care - HMO | Admitting: Family Medicine

## 2015-06-25 ENCOUNTER — Encounter: Payer: Self-pay | Admitting: Family Medicine

## 2015-06-25 VITALS — BP 126/78 | HR 82 | Temp 98.4°F | Ht 59.2 in | Wt 136.0 lb

## 2015-06-25 DIAGNOSIS — R29890 Loss of height: Secondary | ICD-10-CM

## 2015-06-26 NOTE — Progress Notes (Signed)
Patient did not need to be seen today, not due for physical. No charge- will return in April for physical if not sooner.

## 2015-07-03 ENCOUNTER — Other Ambulatory Visit: Payer: Self-pay | Admitting: Family Medicine

## 2015-07-04 ENCOUNTER — Ambulatory Visit
Admission: RE | Admit: 2015-07-04 | Discharge: 2015-07-04 | Disposition: A | Discharge status: Home / Self Care | Payer: Commercial Managed Care - HMO | Source: Ambulatory Visit | Attending: Family Medicine | Admitting: Family Medicine

## 2015-07-04 ENCOUNTER — Encounter: Payer: Self-pay | Admitting: Family Medicine

## 2015-07-04 DIAGNOSIS — M8580 Other specified disorders of bone density and structure, unspecified site: Secondary | ICD-10-CM

## 2015-07-04 DIAGNOSIS — R29890 Loss of height: Secondary | ICD-10-CM | POA: Diagnosis not present

## 2015-07-04 DIAGNOSIS — Z13820 Encounter for screening for osteoporosis: Secondary | ICD-10-CM | POA: Diagnosis not present

## 2015-07-04 DIAGNOSIS — J449 Chronic obstructive pulmonary disease, unspecified: Secondary | ICD-10-CM | POA: Diagnosis not present

## 2015-07-17 ENCOUNTER — Telehealth: Payer: Self-pay

## 2015-07-17 NOTE — Telephone Encounter (Signed)
Joanna Rodriguez from [facility] called they need a [facility] referral for this pt. Pt has an appt with Dr. Fletcher Anon NPI #[id number] this Friday 07/19/15 @ 10:45am. Thanks.

## 2015-07-18 ENCOUNTER — Other Ambulatory Visit: Payer: Self-pay | Admitting: Gastroenterology

## 2015-07-18 NOTE — Telephone Encounter (Signed)
Needs a mail in request for 6MP. Please call her

## 2015-07-19 ENCOUNTER — Ambulatory Visit (INDEPENDENT_AMBULATORY_CARE_PROVIDER_SITE_OTHER): Payer: Commercial Managed Care - HMO | Admitting: Cardiovascular Disease

## 2015-07-19 ENCOUNTER — Encounter: Payer: Self-pay | Admitting: Cardiovascular Disease

## 2015-07-19 VITALS — BP 120/80 | HR 70 | Ht 59.0 in | Wt 136.5 lb

## 2015-07-19 DIAGNOSIS — R002 Palpitations: Secondary | ICD-10-CM

## 2015-07-19 DIAGNOSIS — R0600 Dyspnea, unspecified: Secondary | ICD-10-CM | POA: Diagnosis not present

## 2015-07-19 DIAGNOSIS — R0602 Shortness of breath: Secondary | ICD-10-CM | POA: Diagnosis not present

## 2015-07-19 NOTE — Telephone Encounter (Signed)
[facility] called they still have not received this referral. They need this before pt comes in for her appt today @ 10:45am.   [facility] Referral Needed  [facility]  Dr.Arida NPI # [id number]

## 2015-07-19 NOTE — Progress Notes (Signed)
HPI  This is a pleasant 65 year old female who was referred by [facility] for evaluation of mild palpitations. She is planning to join the [employer] program and start exercising in a regular basis. She has known history of mild COPD and previous tobacco use. She quit smoking last year.the patient had recurrent spontaneous pneumothorax last year and ultimately underwent pleurodesis by Dr. Faith Rogue She complains of mild palpitations but that's usually only when she is under stress. She denies any chest discomfort. She describes mild exertional dyspnea.no syncope or presyncope. She has no history of diabetes or hyperlipidemia. Other medical problems include hypothyroidism, scoliosis and Crohn's disease. She does have family history of coronary artery disease. Her father had myocardial infarction and CABG in his 36s. Her mother had atrial fibrillation.  Allergies  Allergen Reactions  . Nsaids     Other reaction(s): Other (See Comments) Caused Crohn's flare up.     Current Outpatient Prescriptions on File Prior to Visit  Medication Sig Dispense Refill  . acyclovir (ZOVIRAX) 400 MG tablet Take 1 tablet (400 mg total) by mouth daily. 90 tablet 3  . Alum & Mag Hydroxide-Simeth (ANTACID ANTI-GAS MAX STRENGTH PO) Take 1 each by mouth daily.    Marland Kitchen aspirin EC 81 MG tablet Take 81 mg by mouth daily.    . B Complex-Folic Acid (B COMPLEX-VITAMIN B12 PO) Take 1 tablet by mouth daily.    Marland Kitchen Co-Enzyme Q-10 30 MG CAPS Take 1 capsule by mouth daily.    Marland Kitchen levothyroxine (SYNTHROID, LEVOTHROID) 100 MCG tablet Take 1 tablet (100 mcg total) by mouth daily. 90 tablet 3  . lisinopril (PRINIVIL,ZESTRIL) 5 MG tablet TAKE 1 TABLET BY MOUTH DAILY 90 tablet 1  . Melatonin 5 MG TABS Take 5 mg by mouth at bedtime as needed.    . mercaptopurine (PURINETHOL) 50 MG tablet TAKE 1 AND 1/2 TABLETS EVERY DAY 135 tablet 3  . Multiple Vitamins-Minerals (MULTIVITAMIN & MINERAL PO) Take 1 tablet by mouth daily.    . Multiple  Vitamins-Minerals (MULTIVITAMIN WITH MINERALS) tablet Take 1 tablet by mouth daily.    . Tiotropium Bromide Monohydrate (SPIRIVA RESPIMAT) 2.5 MCG/ACT AERS Inhale 2 puffs into the lungs daily. Gargle and rinse after each use 3 Inhaler 1  . traMADol (ULTRAM) 50 MG tablet Take 1 tablet (50 mg total) by mouth 3 (three) times daily. 270 tablet 1   No current facility-administered medications on file prior to visit.     Past Medical History  Diagnosis Date  . Hypothyroid   . Genital herpes   . Crohn's disease ([place])   . Volvulus ([place])   . Arthritis   . Cancer [facility])     face  . COPD (chronic obstructive pulmonary disease) ([place])   . Kidney stones   . Spontaneous pneumothorax   . Osteopenia      Past Surgical History  Procedure Laterality Date  . Volvulus reduction    . Tubal ligation    . [place] pleurodesis  07/2014    left side  . Thoracoscopy  07/2014    left side  . Breast excisional biopsy Bilateral     -  both in 1990's  . Appendectomy    . Abdominal hysterectomy       Family History  Problem Relation Age of Onset  . Hypertension Mother   . Hypothyroidism Mother   . Alcohol abuse Mother   . Breast cancer Mother 21  . Hyperlipidemia Mother   . Mental illness Mother   .  Heart attack Father   . Heart disease Father   . Glaucoma Father   . Colon cancer Father 25  . Diabetes Father   . Hypertension Father   . Heart disease Sister   . Hyperlipidemia Sister   . Hypertension Sister   . Lung disease Sister      Social History   Social History  . Marital Status: Divorced    Spouse Name: N/A  . Number of Children: N/A  . Years of Education: N/A   Occupational History  . 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 Smoking status: Former Smoker -- 1.00 packs/day for 53 years    Types: Cigarettes  . Smokeless tobacco: Never Used     Comment: NOV 16,2015  . Alcohol Use: 0.0 oz/week    0 Standard drinks or equivalent per week      Comment: occasional  . Drug Use: No  . Sexual Activity: Not on file   Other Topics Concern  . Not on file   Social History Narrative     ROS A 10 point review of system was performed. It is negative other than that mentioned in the history of present illness.   PHYSICAL EXAM   BP 120/80 mmHg  Pulse 70  Ht 4' 11"  (1.499 m)  Wt 136 lb 8 oz (61.916 kg)  BMI 27.55 kg/m2 Constitutional: She is oriented to person, place, and time. She appears well-developed and well-nourished. No distress.  HENT: No nasal discharge.  Head: Normocephalic and atraumatic.  Eyes: Pupils are equal and round. No discharge.  Neck: Normal range of motion. Neck supple. No JVD present. No thyromegaly present.  Cardiovascular: Normal rate, regular rhythm, normal heart sounds. Exam reveals no gallop and no friction rub. No murmur heard.  Pulmonary/Chest: Effort normal and breath sounds normal. No stridor. No respiratory distress. She has no wheezes. She has no rales. She exhibits no tenderness.  Abdominal: Soft. Bowel sounds are normal. She exhibits no distension. There is no tenderness. There is no rebound and no guarding.  Musculoskeletal: Normal range of motion. She exhibits no edema and no tenderness.  Neurological: She is alert and oriented to person, place, and time. Coordination normal.  Skin: Skin is warm and dry. No rash noted. She is not diaphoretic. No erythema. No pallor.  Psychiatric: She has a normal mood and affect. Her behavior is normal. Judgment and thought content normal.     VOH:KGOVPC sinus rhythm with low voltage.   ASSESSMENT AND PLAN

## 2015-07-19 NOTE — Patient Instructions (Addendum)
Medication Instructions:  Your physician recommends that you continue on your current medications as directed. Please refer to the Current Medication list given to you today.   Labwork: none  Testing/Procedures: Your physician has requested that you have an exercise tolerance test. For further information please visit [email or web address]. Please also follow instruction sheet, as given.     Follow-Up: Your physician recommends that you schedule a follow-up appointment as needed.   Any Other Special Instructions Will Be Listed Below (If Applicable).     If you need a refill on your cardiac medications before your next appointment, please call your pharmacy.  Exercise Stress Electrocardiogram An exercise stress electrocardiogram is a test to check how blood flows to your heart. It is done to find areas of poor blood flow. You will need to walk on a treadmill for this test. The electrocardiogram will record your heartbeat when you are at rest and when you are exercising. BEFORE THE PROCEDURE  Do not have drinks with caffeine or foods with caffeine for 24 hours before the test, or as told by your doctor. This includes coffee, tea (even decaf tea), sodas, chocolate, and cocoa.  Follow your doctor's instructions about eating and drinking before the test.  Ask your doctor what medicines you should or should not take before the test. Take your medicines with water unless told by your doctor not to.  If you use an inhaler, bring it with you to the test.  Bring a snack to eat after the test.  Do not  smoke for 4 hours before the test.  Do not put lotions, powders, creams, or oils on your chest before the test.  Wear comfortable shoes and clothing. PROCEDURE  You will have patches put on your chest. Small areas of your chest may need to be shaved. Wires will be connected to the patches.  Your heart rate will be watched while you are resting and while you are exercising.  You will  walk on the treadmill. The treadmill will slowly get faster to raise your heart rate.  The test will take about 1-2 hours. AFTER THE PROCEDURE  Your heart rate and blood pressure will be watched after the test.  You may return to your normal diet, activities, and medicines or as told by your doctor.   This information is not intended to replace advice given to you by your health care provider. Make sure you discuss any questions you have with your health care provider.   Document Released: 12/02/2007 Document Revised: 07/06/2014 Document Reviewed: 02/20/2013 Elsevier Interactive Patient Education [employer].

## 2015-07-21 NOTE — Assessment & Plan Note (Signed)
The patient describes mild exertional dyspnea. She has mild palpitations only when she is under stress. She does have mild COPD which might be contributing to some of her symptoms. Nonetheless, she is planning to start an exercise program and has risk factors for coronary artery disease. Thus, I recommend a treadmill stress test to ensure no evidence of ischemia or exercise induced arrhythmia. At cardiac physical exam is unremarkable and baseline ECG is overall normal. Thus, malignant arrhythmia has not suspected.

## 2015-07-24 ENCOUNTER — Ambulatory Visit (INDEPENDENT_AMBULATORY_CARE_PROVIDER_SITE_OTHER): Payer: Commercial Managed Care - HMO

## 2015-07-24 DIAGNOSIS — R0602 Shortness of breath: Secondary | ICD-10-CM | POA: Diagnosis not present

## 2015-07-24 LAB — EXERCISE TOLERANCE TEST
CSEPED: 2 min
CSEPEDS: 43 s
CSEPEW: 4.6 METS
CSEPPHR: 134 {beats}/min
MPHR: 154 {beats}/min
Percent HR: 87 %
Rest HR: 85 {beats}/min

## 2015-08-08 ENCOUNTER — Ambulatory Visit: Payer: Commercial Managed Care - HMO | Admitting: Internal Medicine

## 2015-08-09 ENCOUNTER — Ambulatory Visit (INDEPENDENT_AMBULATORY_CARE_PROVIDER_SITE_OTHER): Payer: Commercial Managed Care - HMO | Admitting: Internal Medicine

## 2015-08-09 ENCOUNTER — Encounter: Payer: Self-pay | Admitting: Internal Medicine

## 2015-08-09 VITALS — BP 138/78 | HR 102 | Ht <= 58 in | Wt 139.0 lb

## 2015-08-09 DIAGNOSIS — J431 Panlobular emphysema: Secondary | ICD-10-CM | POA: Diagnosis not present

## 2015-08-09 MED ORDER — FLUTICASONE FUROATE 100 MCG/ACT IN AEPB: 1.0000 | INHALATION_SPRAY | Freq: Every day | RESPIRATORY_TRACT | Status: DC

## 2015-08-09 MED ORDER — FLUTICASONE FUROATE 100 MCG/ACT IN AEPB
1.0000 | INHALATION_SPRAY | Freq: Every day | RESPIRATORY_TRACT | Status: DC
Start: 2015-08-09 — End: 2015-10-01

## 2015-08-09 NOTE — Progress Notes (Signed)
MRN# [id number] Joanna Rodriguez Jan 20, 1951   CC: Chief Complaint  Patient presents with  . Follow-up    no c/o SOB, chest tightness, CP, occassional cough when cleaning dog hair      Brief History: 09/03/14 This is a pleasant 65 year old female referred by Dr. Durwin Nora for COPD evaluation and workup along with episodes of 2 spontaneous pneumothorax in the last 2 months. Below is her smoking and history of COPD: - 1 episode of bronchitis per year, not every year - No intubations prior to recent hospitalization - No childhood asthma - Pet dog at home - Quit smoking Nov 2015 - No seasonal allergies - Left chest wall tenderness - Still with DOE - can walk to mailbox, or about 3 car lenghts  Patient stated that in Nov 2015 she had a bad "bronchitis" for which he continued to coughing spells and sob, in Jan 2015 had significant SOB, CXR showed L pneumothorax. Had chest tube placed at time, then in Feb 2015, had a another spontaneous pneumothorax which required chest tube placed along with talc pleurodesis.  During per talc pleurodesis procedure Dr. Genevive Bi noted a single bleb on the left side.  Currently patient states that she is doing well, still with shortness of breath and cough, but overall with improvement.  She is currently not smoking  PLAN - stop tobacco, pfts, 67mt  ROV 09/2014 Presents today for a follow up visit. Patient with a past medical history spontaneous pneumothorax x2 status post thoracotomy and chest tube placement. Still with intermittent episodes of SOB, currently not on any inhalers. No recent ED\Urgent care visits. Currently not smoking.  Plan : cont advair\spirvia, diet, exercise  ROV 12/2014 Patient presents for a follow-up visit today. Patient with a past medical history spontaneous pneumothorax 2, status post thoracotomy and chest tube placement by Dr. OGenevive Bi  Since her last visit she says that she has completely stopped smoking, she is increased  but walking to about 10-15 miles per day, which she is measuring on a pedometer. Patient states also since being on Advair and Spiriva, she has had significant reflux especially in the evenings, has not tried any over-the-counter antireflux medications yet. Overall patient states that her dyspnea has improved, she is back to her baseline breathing, and she is more active. She states she has a mild intermittent dry cough that is not bothersome at this time.  Plan: - Finish current dose of Advair, and then start Qvar 80 g 1 puff twice a day gargle and rinse after each use - Spiriva Respimat - 2 puff in the mornings only - rinse and gargle after each use - Continue to avoid tobacco.  Events since last clinic visit:  patient presents for follow-up visit today of her COPD  since her last visit she has stop using Qvar due to significant acid reflux. She is currently on ranitidine 150 mg twice a day.  She states the Spiriva is expensive,  especially when she is in the doughnut hole, $150/minth,  When not in the donut hole it is about $45 per month.  She is currently using Spiriva. She is active, would like to start attending a gym. She walks a dog on a daily basis without any significant shortness of breath  today overall she states she is doing well   Medication:   Current Outpatient Rx  Name  Route  Sig  Dispense  Refill  . acyclovir (ZOVIRAX) 400 MG tablet   Oral   Take 1 tablet (  400 mg total) by mouth daily.   90 tablet   3   . Alum & Mag Hydroxide-Simeth (ANTACID ANTI-GAS MAX STRENGTH PO)   Oral   Take 1 each by mouth daily.         Marland Kitchen aspirin EC 81 MG tablet   Oral   Take 81 mg by mouth daily.         . B Complex-Folic Acid (B COMPLEX-VITAMIN B12 PO)   Oral   Take 1 tablet by mouth daily.         Marland Kitchen BLACK COHOSH PO   Oral   Take by mouth daily.         . Calcium Carb-Cholecalciferol 600-800 MG-UNIT TABS   Oral   Take by mouth 2 (two) times daily.         Marland Kitchen  Co-Enzyme Q-10 30 MG CAPS   Oral   Take 1 capsule by mouth daily.         . Glucosamine-Chondroit-Vit C-Mn (SM GLUCOSAMINE/CHONDROITIN PO)   Oral   Take 1,500 mg by mouth 2 (two) times daily.         Marland Kitchen levothyroxine (SYNTHROID, LEVOTHROID) 100 MCG tablet   Oral   Take 1 tablet (100 mcg total) by mouth daily.   90 tablet   3   . lisinopril (PRINIVIL,ZESTRIL) 5 MG tablet      TAKE 1 TABLET BY MOUTH DAILY   90 tablet   1   . Melatonin 5 MG TABS   Oral   Take 5 mg by mouth at bedtime as needed.         . mercaptopurine (PURINETHOL) 50 MG tablet      TAKE 1 AND 1/2 TABLETS EVERY DAY   135 tablet   3   . Multiple Vitamins-Minerals (MULTIVITAMIN WITH MINERALS) tablet   Oral   Take 1 tablet by mouth daily.         . Omega-3 Fatty Acids (FISH OIL PO)   Oral   Take 1,200 mg by mouth 2 (two) times daily.         . Tiotropium Bromide Monohydrate (SPIRIVA RESPIMAT) 2.5 MCG/ACT AERS   Inhalation   Inhale 2 puffs into the lungs daily. Gargle and rinse after each use   3 Inhaler   1   . traMADol (ULTRAM) 50 MG tablet   Oral   Take 1 tablet (50 mg total) by mouth 3 (three) times daily.   270 tablet   1   . Fluticasone Furoate (ARNUITY ELLIPTA) 100 MCG/ACT AEPB   Inhalation   Inhale 1 puff into the lungs daily.   30 each   0   . Fluticasone Furoate (ARNUITY ELLIPTA) 100 MCG/ACT AEPB   Inhalation   Inhale 1 puff into the lungs daily.   14 each   0      Review of Systems: Gen:  Denies  fever, sweats, chills HEENT: Denies blurred vision, double vision, ear pain, eye pain, hearing loss, nose bleeds, sore throat Cvc:  No dizziness, chest pain or heaviness Resp:   Admits to: Mild dry cough (chornic - improving).  Gi: Denies swallowing difficulty, stomach pain, nausea or vomiting, diarrhea, constipation, bowel incontinence Gu:  Denies bladder incontinence, burning urine Ext:   No Joint pain, stiffness or swelling Skin: No skin rash, easy bruising or bleeding  or hives Endoc:  No polyuria, polydipsia , polyphagia or weight change Other:  All other systems negative  Allergies:  Nsaids  Physical Examination:  VS: BP 138/78 mmHg  Pulse 102  Ht 4' 10"  (1.473 m)  Wt 139 lb (63.05 kg)  BMI 29.06 kg/m2  SpO2 99%  General Appearance: No distress  HEENT: PERRLA, no ptosis, no other lesions noticed Pulmonary:normal breath sounds., diaphragmatic excursion normal.No wheezing, No rales   Cardiovascular:  Normal S1,S2.  No m/r/g.     Abdomen:Exam: Benign, Soft, non-tender, No masses  Skin:   warm, no rashes, no ecchymosis  Extremities: normal, no cyanosis, clubbing, warm with normal capillary refill.     Assessment and Plan: 65 year old female past medical history of moderate COPD, pulmonary bullae, pneumothorax, status post thorascopy presented for follow-up visit COPD (chronic obstructive pulmonary disease) Clinically patient with  COPD, and with obstruction noted on her pulmonary function testing. Patient was educated on pneumothorax and COPD, she was noted to have 1 bullae during her last hospitalization, but with severe emphysema noted on a CAT scan, and she is at increased risk for developing more bullae in the future (especially if she resumes smoking).  She states she's back to baseline breathing currently, walking about 10-15 miles per day, and has stopped smoking completely. Was having moderate to severe reflux in the evenings with Advair and Qvar, has stopped all ICS Currently on Spiriva, with good symptom control, but very costly  Plan: - cont with current dose Spiriva, once finished, then don't use any inhalers.  If SOB returns, then start Arunity. - Patient educated on proper use and administration of Arunity, coupon and sample given - Continue to  avoid tobacco. - cont with diet and exercise, daily walking.           Updated Medication List Outpatient Encounter Prescriptions as of 08/09/2015  Medication Sig  . acyclovir  (ZOVIRAX) 400 MG tablet Take 1 tablet (400 mg total) by mouth daily.  . Alum & Mag Hydroxide-Simeth (ANTACID ANTI-GAS MAX STRENGTH PO) Take 1 each by mouth daily.  Marland Kitchen aspirin EC 81 MG tablet Take 81 mg by mouth daily.  . B Complex-Folic Acid (B COMPLEX-VITAMIN B12 PO) Take 1 tablet by mouth daily.  Marland Kitchen BLACK COHOSH PO Take by mouth daily.  . Calcium Carb-Cholecalciferol 600-800 MG-UNIT TABS Take by mouth 2 (two) times daily.  Marland Kitchen Co-Enzyme Q-10 30 MG CAPS Take 1 capsule by mouth daily.  . Glucosamine-Chondroit-Vit C-Mn (SM GLUCOSAMINE/CHONDROITIN PO) Take 1,500 mg by mouth 2 (two) times daily.  Marland Kitchen levothyroxine (SYNTHROID, LEVOTHROID) 100 MCG tablet Take 1 tablet (100 mcg total) by mouth daily.  Marland Kitchen lisinopril (PRINIVIL,ZESTRIL) 5 MG tablet TAKE 1 TABLET BY MOUTH DAILY  . Melatonin 5 MG TABS Take 5 mg by mouth at bedtime as needed.  . mercaptopurine (PURINETHOL) 50 MG tablet TAKE 1 AND 1/2 TABLETS EVERY DAY  . Multiple Vitamins-Minerals (MULTIVITAMIN WITH MINERALS) tablet Take 1 tablet by mouth daily.  . Omega-3 Fatty Acids (FISH OIL PO) Take 1,200 mg by mouth 2 (two) times daily.  . Tiotropium Bromide Monohydrate (SPIRIVA RESPIMAT) 2.5 MCG/ACT AERS Inhale 2 puffs into the lungs daily. Gargle and rinse after each use  . traMADol (ULTRAM) 50 MG tablet Take 1 tablet (50 mg total) by mouth 3 (three) times daily.  . Fluticasone Furoate (ARNUITY ELLIPTA) 100 MCG/ACT AEPB Inhale 1 puff into the lungs daily.  . Fluticasone Furoate (ARNUITY ELLIPTA) 100 MCG/ACT AEPB Inhale 1 puff into the lungs daily.  . [DISCONTINUED] Multiple Vitamins-Minerals (MULTIVITAMIN & MINERAL PO) Take 1 tablet by mouth daily.   No facility-administered encounter medications on file as of 08/09/2015.  Orders for this visit: No orders of the defined types were placed in this encounter.    Thank  you for the visitation and for allowing  [facility] to assist in the care of your patient. Our recommendations  are noted above.  Please contact us if we can be of further service.  Vilinda Boehringer, MD [facility] Office Number: [phone]

## 2015-08-09 NOTE — Patient Instructions (Signed)
Follow up with Dr. Stevenson Clinch in:78month -  A complete your current Spiriva dose inhaler.  If after 1-2 weeks he start back experiencing shortness of breath at rest or with exertion  Then start Arnuity (1042m) - 1 puff daily (gargle and rinse after each use) -  We will give you a coupon for Arnuity, 1 month free.  If you have improvement with Arnuity and would like to continue it, then [place] [facility] and we can write a prescription for 90 days.  - cont with tobacco avoidance - as discussed during your visit, you may start attending a gym  - cont with diet and exercise.

## 2015-08-10 NOTE — Assessment & Plan Note (Signed)
Clinically patient with  COPD, and with obstruction noted on her pulmonary function testing. Patient was educated on pneumothorax and COPD, she was noted to have 1 bullae during her last hospitalization, but with severe emphysema noted on a CAT scan, and she is at increased risk for developing more bullae in the future (especially if she resumes smoking).  She states she's back to baseline breathing currently, walking about 10-15 miles per day, and has stopped smoking completely. Was having moderate to severe reflux in the evenings with Advair and Qvar, has stopped all ICS Currently on Spiriva, with good symptom control, but very costly  Plan: - cont with current dose Spiriva, once finished, then don't use any inhalers.  If SOB returns, then start Arunity. - Patient educated on proper use and administration of Arunity, coupon and sample given - Continue to  avoid tobacco. - cont with diet and exercise, daily walking.

## 2015-08-19 DIAGNOSIS — H5203 Hypermetropia, bilateral: Secondary | ICD-10-CM | POA: Diagnosis not present

## 2015-08-19 DIAGNOSIS — H2513 Age-related nuclear cataract, bilateral: Secondary | ICD-10-CM | POA: Diagnosis not present

## 2015-08-19 DIAGNOSIS — H5210 Myopia, unspecified eye: Secondary | ICD-10-CM | POA: Diagnosis not present

## 2015-08-26 ENCOUNTER — Telehealth: Payer: Self-pay | Admitting: Internal Medicine

## 2015-08-26 MED ORDER — TIOTROPIUM BROMIDE MONOHYDRATE 2.5 MCG/ACT IN AERS: 2.0000 | INHALATION_SPRAY | Freq: Every day | RESPIRATORY_TRACT | Status: DC

## 2015-08-26 NOTE — Telephone Encounter (Signed)
Spoke with pt, states she needs a refill on spiriva-90 day supply.   Pt uses [facility] mail order pharmacy.  This has been sent.   Pt also states she is taking ranitidine 147m bid- sometimes TID depending on her reflux.  Pt has been buying this OTC, wants to know if VM is ok with sending in a rx to mail order pharmacy for pt to help cut down cost.    VM please advise.  Thanks!

## 2015-08-26 NOTE — Telephone Encounter (Signed)
Prefer if the PMD writes the ranitidine rx.   Thank you.

## 2015-08-26 NOTE — Telephone Encounter (Signed)
Pt informed. Nothing further needed. 

## 2015-10-01 ENCOUNTER — Encounter: Payer: Self-pay | Admitting: Family Medicine

## 2015-10-01 ENCOUNTER — Ambulatory Visit (INDEPENDENT_AMBULATORY_CARE_PROVIDER_SITE_OTHER): Payer: Commercial Managed Care - HMO | Admitting: Family Medicine

## 2015-10-01 VITALS — BP 130/79 | HR 75 | Temp 98.8°F | Ht <= 58 in | Wt 139.0 lb

## 2015-10-01 DIAGNOSIS — R1310 Dysphagia, unspecified: Secondary | ICD-10-CM | POA: Diagnosis not present

## 2015-10-01 DIAGNOSIS — J9383 Other pneumothorax: Secondary | ICD-10-CM | POA: Diagnosis not present

## 2015-10-01 DIAGNOSIS — B354 Tinea corporis: Secondary | ICD-10-CM

## 2015-10-01 DIAGNOSIS — J431 Panlobular emphysema: Secondary | ICD-10-CM

## 2015-10-01 DIAGNOSIS — H6982 Other specified disorders of Eustachian tube, left ear: Secondary | ICD-10-CM

## 2015-10-01 DIAGNOSIS — I10 Essential (primary) hypertension: Secondary | ICD-10-CM | POA: Diagnosis not present

## 2015-10-01 MED ORDER — KETOCONAZOLE 2 % EX SHAM: 1.0000 "application " | MEDICATED_SHAMPOO | CUTANEOUS | Status: DC

## 2015-10-01 NOTE — Progress Notes (Signed)
BP 130/79 mmHg  Pulse 75  Temp(Src) 98.8 F (37.1 C)  Ht 4' 10"  (1.473 m)  Wt 139 lb (63.05 kg)  BMI 29.06 kg/m2  SpO2 96%   Subjective:    Patient ID: Joanna Rodriguez, female    DOB: 1950-12-12, 65 y.o.   MRN: [id number]  HPI: Joanna Rodriguez is a 65 y.o. female  Chief Complaint  Patient presents with  . Rash    on both legs  . Referral    Patient needs a new referral to see pulmonology- Dr.Mungal  . Paper Work   RASH Duration:  months  Location: generalized  Itching: no Burning: no Redness: yes Oozing: no Scaling: yes Blisters: no Painful: no Fevers: no Change in detergents/soaps/personal care products: no Recent illness: no Recent travel:no History of same: no Context: stable Alleviating factors: lotion/moisturizer Treatments attempted:lotion/moisturizer Shortness of breath: no  Throat/tongue swelling: no Myalgias/arthralgias: no  Having some reflux thought to be due to the spiriva, has been taking zantac. Needs a referral back to see Dr. Stevenson Clinch before her appointment with him in August  DYSPHAGIA Duration: year or so Description of symptom: gets stuck and hurts Onset: Several seconds after swallowing Location of dysphagia: epigastric Dysphagia to solids only: yes Dysphagia to solids & liquids: no  Frequency:intermittent  Progressively getting worse: no Alleviatiating factors: vomiting Provoking factors: certain foods- bread Status: fluctuating EGD: yes a long time ago Weight loss: no Sensation of lump in throat: no Heartburn: no Odynophagia: no Nausea: no Vomiting: yes Drooling/nasal regurgitation/food spillage: no Coughing/choking/dysphonia: yes Dysarthria: no Hematemesis: no Regurgitation of undigested food/halitosis: no Chest pain: no  EAG CLOGGED Duration: 1-2 months Involved ear(s):  left Sensation of feeling clogged/plugged: yes Decreased/muffled hearing:no Ear pain: yes Fever: no Otorrhea: no Hearing loss:  no Upper respiratory infection symptoms: no Using Q-Tips: no Status: fluctuating History of cerumenosis: no Treatments attempted: none  Relevant past medical, surgical, family and social history reviewed and updated as indicated. Interim medical history since our last visit reviewed. Allergies and medications reviewed and updated.  Review of Systems  Constitutional: Negative.   HENT: Positive for trouble swallowing. Negative for congestion, dental problem, drooling, ear discharge, ear pain, facial swelling, hearing loss, mouth sores, nosebleeds, postnasal drip, rhinorrhea, sinus pressure, sneezing, sore throat, tinnitus and voice change.   Respiratory: Negative.   Cardiovascular: Negative.   Gastrointestinal: Positive for nausea. Negative for vomiting, abdominal pain, diarrhea, constipation, blood in stool, abdominal distention, anal bleeding and rectal pain.  Musculoskeletal: Negative.   Psychiatric/Behavioral: Negative.     Per HPI unless specifically indicated above     Objective:    BP 130/79 mmHg  Pulse 75  Temp(Src) 98.8 F (37.1 C)  Ht 4' 10"  (1.473 m)  Wt 139 lb (63.05 kg)  BMI 29.06 kg/m2  SpO2 96%  Wt Readings from Last 3 Encounters:  10/01/15 139 lb (63.05 kg)  08/09/15 139 lb (63.05 kg)  07/19/15 136 lb 8 oz (61.916 kg)    Physical Exam  Constitutional: She is oriented to person, place, and time. She appears well-developed and well-nourished. No distress.  HENT:  Head: Normocephalic and atraumatic.  Right Ear: Hearing, tympanic membrane, external ear and ear canal normal.  Left Ear: Hearing, tympanic membrane, external ear and ear canal normal.  Nose: Nose normal.  Mouth/Throat: Uvula is midline, oropharynx is clear and moist and mucous membranes are normal. No oropharyngeal exudate.  Eyes: Conjunctivae, EOM and lids are normal. Pupils are equal, round, and reactive to light. Right  eye exhibits no discharge. Left eye exhibits no discharge. No scleral icterus.   Cardiovascular: Normal rate, regular rhythm, normal heart sounds and intact distal pulses.  Exam reveals no gallop and no friction rub.   No murmur heard. Pulmonary/Chest: Effort normal and breath sounds normal. No respiratory distress. She has no wheezes. She has no rales. She exhibits no tenderness.  Abdominal: Soft. Bowel sounds are normal. She exhibits no distension and no mass. There is no tenderness. There is no rebound and no guarding.  Musculoskeletal: Normal range of motion.  Neurological: She is alert and oriented to person, place, and time.  Skin: Skin is warm, dry and intact. Rash (hypopigmented lesions on belly and legs) noted. She is not diaphoretic. No erythema. No pallor.  Psychiatric: She has a normal mood and affect. Her speech is normal and behavior is normal. Judgment and thought content normal. Cognition and memory are normal.  Nursing note and vitals reviewed.   Results for orders placed or performed in visit on 07/24/15  Exercise Tolerance Test  Result Value Ref Range   Rest HR 85 bpm   Rest BP 140/78 mmHg   Exercise duration (min) 2 min   Exercise duration (sec) 43 sec   Estimated workload 4.6 METS   Peak HR 134 bpm   Peak BP 180/65 mmHg   MPHR 154 bpm   Percent HR 87 %   RPE        Assessment & Plan:   Problem List Items Addressed This Visit      Cardiovascular and Mediastinum   HTN (hypertension)    Better on recheck. Continue to monitor at home. Call with any concerns. Recheck at physical in May        Respiratory   COPD (chronic obstructive pulmonary disease) ([place])    Referral back to see pulmonology. Continue to follow with pulmonology. Call with any concerns. Continue to follow with pulmonology.      Relevant Orders   Ambulatory referral to Pulmonology   Spontaneous pneumothorax    Referral back to see pulmonology. Continue to follow with pulmonology. Call with any concerns. Continue to follow with pulmonology.      Relevant Orders    Ambulatory referral to Pulmonology    Other Visit Diagnoses    Dysphagia    -  Primary    Will call Dr. Allen Norris to examine and may need another EGD. Continue zantac. Consider PPI. Continue to monitor.     Tinea corporis        Will treat with ketoconazole shampoo. Call if not getting better or getting worse.     Relevant Medications    ketoconazole (NIZORAL) 2 % shampoo    ETD (eustachian tube dysfunction), left        Will continue to monitor. OTC flonase as needed. Call if not getting better or getting worse.         Follow up plan: Return May, for Physical.

## 2015-10-01 NOTE — Assessment & Plan Note (Signed)
Better on recheck. Continue to monitor at home. Call with any concerns. Recheck at physical in May

## 2015-10-01 NOTE — Assessment & Plan Note (Signed)
Referral back to see pulmonology. Continue to follow with pulmonology. Call with any concerns. Continue to follow with pulmonology.

## 2015-10-01 NOTE — Patient Instructions (Signed)
DASH Eating Plan  DASH stands for "Dietary Approaches to Stop Hypertension." The DASH eating plan is a healthy eating plan that has been shown to reduce high blood pressure (hypertension). Additional health benefits may include reducing the risk of type 2 diabetes mellitus, heart disease, and stroke. The DASH eating plan may also help with weight loss.  WHAT DO I NEED TO KNOW ABOUT THE DASH EATING PLAN?  For the DASH eating plan, you will follow these general guidelines:  · Choose foods with a percent daily value for sodium of less than 5% (as listed on the food label).  · Use salt-free seasonings or herbs instead of table salt or sea salt.  · Check with your health care provider or pharmacist before using salt substitutes.  · Eat lower-sodium products, often labeled as "lower sodium" or "no salt added."  · Eat fresh foods.  · Eat more vegetables, fruits, and low-fat dairy products.  · Choose whole grains. Look for the word "whole" as the first word in the ingredient list.  · Choose fish and skinless chicken or turkey more often than red meat. Limit fish, poultry, and meat to 6 oz (170 g) each day.  · Limit sweets, desserts, sugars, and sugary drinks.  · Choose heart-healthy fats.  · Limit cheese to 1 oz (28 g) per day.  · Eat more home-cooked food and less restaurant, buffet, and fast food.  · Limit fried foods.  · Cook foods using methods other than frying.  · Limit canned vegetables. If you do use them, rinse them well to decrease the sodium.  · When eating at a restaurant, ask that your food be prepared with less salt, or no salt if possible.  WHAT FOODS CAN I EAT?  Seek help from a dietitian for individual calorie needs.  Grains  Whole grain or whole wheat bread. Brown rice. Whole grain or whole wheat pasta. Quinoa, bulgur, and whole grain cereals. Low-sodium cereals. Corn or whole wheat flour tortillas. Whole grain cornbread. Whole grain crackers. Low-sodium crackers.  Vegetables  Fresh or frozen vegetables  (raw, steamed, roasted, or grilled). Low-sodium or reduced-sodium tomato and vegetable juices. Low-sodium or reduced-sodium tomato sauce and paste. Low-sodium or reduced-sodium canned vegetables.   Fruits  All fresh, canned (in natural juice), or frozen fruits.  Meat and Other Protein Products  Ground beef (85% or leaner), grass-fed beef, or beef trimmed of fat. Skinless chicken or turkey. Ground chicken or turkey. Pork trimmed of fat. All fish and seafood. Eggs. Dried beans, peas, or lentils. Unsalted nuts and seeds. Unsalted canned beans.  Dairy  Low-fat dairy products, such as skim or 1% milk, 2% or reduced-fat cheeses, low-fat ricotta or cottage cheese, or plain low-fat yogurt. Low-sodium or reduced-sodium cheeses.  Fats and Oils  Tub margarines without trans fats. Light or reduced-fat mayonnaise and salad dressings (reduced sodium). Avocado. Safflower, olive, or canola oils. Natural peanut or almond butter.  Other  Unsalted popcorn and pretzels.  The items listed above may not be a complete list of recommended foods or beverages. Contact your dietitian for more options.  WHAT FOODS ARE NOT RECOMMENDED?  Grains  White bread. White pasta. White rice. Refined cornbread. Bagels and croissants. Crackers that contain trans fat.  Vegetables  Creamed or fried vegetables. Vegetables in a cheese sauce. Regular canned vegetables. Regular canned tomato sauce and paste. Regular tomato and vegetable juices.  Fruits  Dried fruits. Canned fruit in light or heavy syrup. Fruit juice.  Meat and Other Protein   Products  Fatty cuts of meat. Ribs, chicken wings, bacon, sausage, bologna, salami, chitterlings, fatback, hot dogs, bratwurst, and packaged luncheon meats. Salted nuts and seeds. Canned beans with salt.  Dairy  Whole or 2% milk, cream, half-and-half, and cream cheese. Whole-fat or sweetened yogurt. Full-fat cheeses or blue cheese. Nondairy creamers and whipped toppings. Processed cheese, cheese spreads, or cheese  curds.  Condiments  Onion and garlic salt, seasoned salt, table salt, and sea salt. Canned and packaged gravies. Worcestershire sauce. Tartar sauce. Barbecue sauce. Teriyaki sauce. Soy sauce, including reduced sodium. Steak sauce. Fish sauce. Oyster sauce. Cocktail sauce. Horseradish. Ketchup and mustard. Meat flavorings and tenderizers. Bouillon cubes. Hot sauce. Tabasco sauce. Marinades. Taco seasonings. Relishes.  Fats and Oils  Butter, stick margarine, lard, shortening, ghee, and bacon fat. Coconut, palm kernel, or palm oils. Regular salad dressings.  Other  Pickles and olives. Salted popcorn and pretzels.  The items listed above may not be a complete list of foods and beverages to avoid. Contact your dietitian for more information.  WHERE CAN I FIND MORE INFORMATION?  National Heart, Lung, and [employer] Institute: www.nhlbi.nih.gov/health/health-topics/topics/dash/     This information is not intended to replace advice given to you by your health care provider. Make sure you discuss any questions you have with your health care provider.     Document Released: 06/04/2011 Document Revised: 07/06/2014 Document Reviewed: 04/19/2013  Elsevier Interactive Patient Education ©2016 [employer]

## 2015-10-02 ENCOUNTER — Other Ambulatory Visit: Payer: Self-pay | Admitting: Gastroenterology

## 2015-10-02 ENCOUNTER — Other Ambulatory Visit: Payer: Self-pay | Admitting: Family Medicine

## 2015-10-02 DIAGNOSIS — Z1231 Encounter for screening mammogram for malignant neoplasm of breast: Secondary | ICD-10-CM

## 2015-10-09 ENCOUNTER — Other Ambulatory Visit: Payer: Self-pay

## 2015-10-09 DIAGNOSIS — K219 Gastro-esophageal reflux disease without esophagitis: Secondary | ICD-10-CM

## 2015-10-16 NOTE — Telephone Encounter (Signed)
Patient left a voice message that her tremodol was on hold per her insurance.

## 2015-10-21 ENCOUNTER — Telehealth: Payer: Self-pay | Admitting: *Deleted

## 2015-10-21 ENCOUNTER — Other Ambulatory Visit: Payer: Self-pay | Admitting: Gastroenterology

## 2015-10-21 NOTE — Telephone Encounter (Signed)
Received referral for initial lung cancer screening scan. Contacted patient and obtained smoking history as well as answering questions related to screening process. Patient is tentatively scheduled for shared decision making visit and CT scan on 10/25/15, pending insurance approval from business office.

## 2015-10-21 NOTE — Telephone Encounter (Signed)
Tremadol refill pharmacy said they need to hear from the doctors office. The number is [phone]

## 2015-10-23 ENCOUNTER — Other Ambulatory Visit: Payer: Self-pay | Admitting: Family Medicine

## 2015-10-23 ENCOUNTER — Encounter: Payer: Self-pay | Admitting: Family Medicine

## 2015-10-23 DIAGNOSIS — Z87891 Personal history of nicotine dependence: Secondary | ICD-10-CM

## 2015-10-23 HISTORY — DX: Personal history of nicotine dependence: Z87.891

## 2015-10-24 ENCOUNTER — Encounter: Payer: Self-pay | Admitting: Family Medicine

## 2015-10-25 ENCOUNTER — Inpatient Hospital Stay: Payer: Commercial Managed Care - HMO | Attending: Family Medicine | Admitting: Family Medicine

## 2015-10-25 ENCOUNTER — Ambulatory Visit
Admission: RE | Admit: 2015-10-25 | Discharge: 2015-10-25 | Disposition: A | Discharge status: Home / Self Care | Payer: Commercial Managed Care - HMO | Source: Ambulatory Visit | Attending: Family Medicine | Admitting: Family Medicine

## 2015-10-25 DIAGNOSIS — Z87891 Personal history of nicotine dependence: Secondary | ICD-10-CM | POA: Diagnosis not present

## 2015-10-25 DIAGNOSIS — I2510 Atherosclerotic heart disease of native coronary artery without angina pectoris: Secondary | ICD-10-CM | POA: Diagnosis not present

## 2015-10-25 DIAGNOSIS — Z122 Encounter for screening for malignant neoplasm of respiratory organs: Secondary | ICD-10-CM | POA: Diagnosis not present

## 2015-10-25 DIAGNOSIS — Z9889 Other specified postprocedural states: Secondary | ICD-10-CM | POA: Diagnosis not present

## 2015-10-25 NOTE — Progress Notes (Signed)
In accordance with CMS guidelines, patient has meet eligibility criteria including age, absence of signs or symptoms of lung cancer, the specific calculation of cigarette smoking pack-years was 53 years and is a former smoker having quit in 2016.   A shared decision-making session was conducted prior to the performance of CT scan. This includes one or more decision aids, includes benefits and harms of screening, follow-up diagnostic testing, over-diagnosis, false positive rate, and total radiation exposure.  Counseling on the importance of adherence to annual lung cancer LDCT screening, impact of co-morbidities, and ability or willingness to undergo diagnosis and treatment is imperative for compliance of the program.  Counseling on the importance of continued smoking cessation for former smokers; the importance of smoking cessation for current smokers and information about tobacco cessation interventions have been given to patient including the [place] [facility], [address] quit [place], as well as [place] specific smoking cessation programs.  Written order for lung cancer screening with LDCT has been given to the patient and any and all questions have been answered to the best of my abilities.   Yearly follow up will be scheduled by Burgess Estelle, Thoracic Navigator.

## 2015-10-28 ENCOUNTER — Encounter: Payer: Self-pay | Admitting: Family Medicine

## 2015-10-28 ENCOUNTER — Ambulatory Visit (INDEPENDENT_AMBULATORY_CARE_PROVIDER_SITE_OTHER): Payer: Commercial Managed Care - HMO | Admitting: Family Medicine

## 2015-10-28 ENCOUNTER — Telehealth: Payer: Self-pay | Admitting: *Deleted

## 2015-10-28 VITALS — BP 133/78 | HR 68 | Temp 98.1°F | Ht 60.5 in | Wt 139.0 lb

## 2015-10-28 DIAGNOSIS — Z13220 Encounter for screening for lipoid disorders: Secondary | ICD-10-CM | POA: Diagnosis not present

## 2015-10-28 DIAGNOSIS — E039 Hypothyroidism, unspecified: Secondary | ICD-10-CM | POA: Diagnosis not present

## 2015-10-28 DIAGNOSIS — A6000 Herpesviral infection of urogenital system, unspecified: Secondary | ICD-10-CM

## 2015-10-28 DIAGNOSIS — J431 Panlobular emphysema: Secondary | ICD-10-CM | POA: Diagnosis not present

## 2015-10-28 DIAGNOSIS — K50119 Crohn's disease of large intestine with unspecified complications: Secondary | ICD-10-CM

## 2015-10-28 DIAGNOSIS — I10 Essential (primary) hypertension: Secondary | ICD-10-CM

## 2015-10-28 DIAGNOSIS — E538 Deficiency of other specified B group vitamins: Secondary | ICD-10-CM | POA: Diagnosis not present

## 2015-10-28 DIAGNOSIS — Z0000 Encounter for general adult medical examination without abnormal findings: Secondary | ICD-10-CM | POA: Diagnosis not present

## 2015-10-28 DIAGNOSIS — M8580 Other specified disorders of bone density and structure, unspecified site: Secondary | ICD-10-CM | POA: Diagnosis not present

## 2015-10-28 DIAGNOSIS — Z87891 Personal history of nicotine dependence: Secondary | ICD-10-CM

## 2015-10-28 LAB — UA/M W/RFLX CULTURE, ROUTINE
BILIRUBIN UA: NEGATIVE
GLUCOSE, UA: NEGATIVE
Leukocytes, UA: NEGATIVE
NITRITE UA: NEGATIVE
Protein, UA: NEGATIVE
RBC UA: NEGATIVE
Specific Gravity, UA: 1.02 (ref 1.005–1.030)
UUROB: 0.2 mg/dL (ref 0.2–1.0)
pH, UA: 5.5 (ref 5.0–7.5)

## 2015-10-28 LAB — MICROALBUMIN, URINE WAIVED
Creatinine, Urine Waived: 200 mg/dL (ref 10–300)
MICROALB, UR WAIVED: 30 mg/L — AB (ref 0–19)
Microalb/Creat Ratio: <30 mg/g (ref <30)

## 2015-10-28 MED ORDER — ACYCLOVIR 400 MG PO TABS: 400.0000 mg | ORAL_TABLET | Freq: Every day | ORAL | Status: DC

## 2015-10-28 MED ORDER — LISINOPRIL 5 MG PO TABS: 5.0000 mg | ORAL_TABLET | Freq: Every day | ORAL | Status: DC

## 2015-10-28 NOTE — Telephone Encounter (Signed)
Notified patient of LDCT lung cancer screening results of Lung Rads 1 finding with recommendation for 12 month follow up imaging. Also notified of incidental finding noted below. Patient verbalizes understanding.   IMPRESSION: 1. Lung-RADS Category 1, negative. Continue annual screening with low-dose chest CT without contrast in 12 months. 2. [place] pleurodesis in the left hemi thorax. 3. Coronary artery calcification.

## 2015-10-28 NOTE — Assessment & Plan Note (Signed)
Rechecking levels again today. Await results.

## 2015-10-28 NOTE — Assessment & Plan Note (Signed)
Under good control. Continue current regimen. Continue to monitor. Recheck 6 months.

## 2015-10-28 NOTE — Assessment & Plan Note (Signed)
Under good control. Continue current regimen. Continue to monitor. Recheck 6 months. Continue to follow with Dr. Stevenson Clinch as needed.

## 2015-10-28 NOTE — Assessment & Plan Note (Signed)
Continue diet and exercise. Continue to monitor.

## 2015-10-28 NOTE — Patient Instructions (Addendum)
Preventative Services:  Health Risk Assessment and Personalized Prevention Plan: Done today Bone Mass Measurements: up to date Breast Cancer Screening: up to date CVD Screening:  Done today Cervical Cancer Screening: N/A Colon Cancer Screening:  up to date Depression Screening: done today Diabetes Screening: done today Glaucoma Screening: See your eye doctor Hepatitis B vaccine: N/A Hepatitis C screening:  up to date HIV Screening:  up to date Flu Vaccine:  up to date Lung cancer Screening: Up to date- due in 1 year Obesity Screening: Done today Pneumonia Vaccines (2):  up to date STI Screening: N/A Health Maintenance, Female Adopting a healthy lifestyle and getting preventive care can go a long way to promote health and wellness. Talk with your health care provider about what schedule of regular examinations is right for you. This is a good chance for you to check in with your provider about disease prevention and staying healthy. In between checkups, there are plenty of things you can do on your own. Experts have done a lot of research about which lifestyle changes and preventive measures are most likely to keep you healthy. Ask your health care provider for more information. WEIGHT AND DIET  Eat a healthy diet  Be sure to include plenty of vegetables, fruits, low-fat dairy products, and lean protein.  Do not eat a lot of foods high in solid fats, added sugars, or salt.  Get regular exercise. This is one of the most important things you can do for your health.  Most adults should exercise for at least 150 minutes each week. The exercise should increase your heart rate and make you sweat (moderate-intensity exercise).  Most adults should also do strengthening exercises at least twice a week. This is in addition to the moderate-intensity exercise.  Maintain a healthy weight  Body mass index (BMI) is a measurement that can be used to identify possible weight problems. It estimates  body fat based on height and weight. Your health care provider can help determine your BMI and help you achieve or maintain a healthy weight.  For females 42 years of age and older:   A BMI below 18.5 is considered underweight.  A BMI of 18.5 to 24.9 is normal.  A BMI of 25 to 29.9 is considered overweight.  A BMI of 30 and above is considered obese.  Watch levels of cholesterol and blood lipids  You should start having your blood tested for lipids and cholesterol at 67 years of age, then have this test every 5 years.  You may need to have your cholesterol levels checked more often if:  Your lipid or cholesterol levels are high.  You are older than 67 years of age.  You are at high risk for heart disease.  CANCER SCREENING   Lung Cancer  Lung cancer screening is recommended for adults 22-13 years old who are at high risk for lung cancer because of a history of smoking.  A yearly low-dose CT scan of the lungs is recommended for people who:  Currently smoke.  Have quit within the past 15 years.  Have at least a 30-pack-year history of smoking. A pack year is smoking an average of one pack of cigarettes a day for 1 year.  Yearly screening should continue until it has been 15 years since you quit.  Yearly screening should stop if you develop a health problem that would prevent you from having lung cancer treatment.  Breast Cancer  Practice breast self-awareness. This means understanding how your  breasts normally appear and feel.  It also means doing regular breast self-exams. Let your health care provider know about any changes, no matter how small.  If you are in your 20s or 30s, you should have a clinical breast exam (CBE) by a health care provider every 1-3 years as part of a regular health exam.  If you are 52 or older, have a CBE every year. Also consider having a breast X-ray (mammogram) every year.  If you have a family history of breast cancer, talk to your  health care provider about genetic screening.  If you are at high risk for breast cancer, talk to your health care provider about having an MRI and a mammogram every year.  Breast cancer gene (BRCA) assessment is recommended for women who have family members with BRCA-related cancers. BRCA-related cancers include:  Breast.  Ovarian.  Tubal.  Peritoneal cancers.  Results of the assessment will determine the need for genetic counseling and BRCA1 and BRCA2 testing. Cervical Cancer Your health care provider may recommend that you be screened regularly for cancer of the pelvic organs (ovaries, uterus, and vagina). This screening involves a pelvic examination, including checking for microscopic changes to the surface of your cervix (Pap test). You may be encouraged to have this screening done every 3 years, beginning at age 90.  For women ages 37-65, health care providers may recommend pelvic exams and Pap testing every 3 years, or they may recommend the Pap and pelvic exam, combined with testing for human papilloma virus (HPV), every 5 years. Some types of HPV increase your risk of cervical cancer. Testing for HPV may also be done on women of any age with unclear Pap test results.  Other health care providers may not recommend any screening for nonpregnant women who are considered low risk for pelvic cancer and who do not have symptoms. Ask your health care provider if a screening pelvic exam is right for you.  If you have had past treatment for cervical cancer or a condition that could lead to cancer, you need Pap tests and screening for cancer for at least 20 years after your treatment. If Pap tests have been discontinued, your risk factors (such as having a new sexual partner) need to be reassessed to determine if screening should resume. Some women have medical problems that increase the chance of getting cervical cancer. In these cases, your health care provider may recommend more frequent  screening and Pap tests. Colorectal Cancer  This type of cancer can be detected and often prevented.  Routine colorectal cancer screening usually begins at 67 years of age and continues through 67 years of age.  Your health care provider may recommend screening at an earlier age if you have risk factors for colon cancer.  Your health care provider may also recommend using home test kits to check for hidden blood in the stool.  A small camera at the end of a tube can be used to examine your colon directly (sigmoidoscopy or colonoscopy). This is done to check for the earliest forms of colorectal cancer.  Routine screening usually begins at age 62.  Direct examination of the colon should be repeated every 5-10 years through 67 years of age. However, you may need to be screened more often if early forms of precancerous polyps or small growths are found. Skin Cancer  Check your skin from head to toe regularly.  Tell your health care provider about any new moles or changes in moles, especially if  there is a change in a mole's shape or color.  Also tell your health care provider if you have a mole that is larger than the size of a pencil eraser.  Always use sunscreen. Apply sunscreen liberally and repeatedly throughout the day.  Protect yourself by wearing long sleeves, pants, a wide-brimmed hat, and sunglasses whenever you are outside. HEART DISEASE, DIABETES, AND HIGH BLOOD PRESSURE   High blood pressure causes heart disease and increases the risk of stroke. High blood pressure is more likely to develop in:  People who have blood pressure in the high end of the normal range (130-139/85-89 mm Hg).  People who are overweight or obese.  People who are African American.  If you are 77-22 years of age, have your blood pressure checked every 3-5 years. If you are 18 years of age or older, have your blood pressure checked every year. You should have your blood pressure measured twice--once  when you are at a hospital or clinic, and once when you are not at a hospital or clinic. Record the average of the two measurements. To check your blood pressure when you are not at a hospital or clinic, you can use:  An automated blood pressure machine at a pharmacy.  A home blood pressure monitor.  If you are between 5 years and 72 years old, ask your health care provider if you should take aspirin to prevent strokes.  Have regular diabetes screenings. This involves taking a blood sample to check your fasting blood sugar level.  If you are at a normal weight and have a low risk for diabetes, have this test once every three years after 67 years of age.  If you are overweight and have a high risk for diabetes, consider being tested at a younger age or more often. PREVENTING INFECTION  Hepatitis B  If you have a higher risk for hepatitis B, you should be screened for this virus. You are considered at high risk for hepatitis B if:  You were born in a country where hepatitis B is common. Ask your health care provider which countries are considered high risk.  Your parents were born in a high-risk country, and you have not been immunized against hepatitis B (hepatitis B vaccine).  You have HIV or AIDS.  You use needles to inject street drugs.  You live with someone who has hepatitis B.  You have had sex with someone who has hepatitis B.  You get hemodialysis treatment.  You take certain medicines for conditions, including cancer, organ transplantation, and autoimmune conditions. Hepatitis C  Blood testing is recommended for:  Everyone born from [birth year] through 1967.  Anyone with known risk factors for hepatitis C. Sexually transmitted infections (STIs)  You should be screened for sexually transmitted infections (STIs) including gonorrhea and chlamydia if:  You are sexually active and are younger than 67 years of age.  You are older than 67 years of age and your health care  provider tells you that you are at risk for this type of infection.  Your sexual activity has changed since you were last screened and you are at an increased risk for chlamydia or gonorrhea. Ask your health care provider if you are at risk.  If you do not have HIV, but are at risk, it may be recommended that you take a prescription medicine daily to prevent HIV infection. This is called pre-exposure prophylaxis (PrEP). You are considered at risk if:  You are sexually active and do  not regularly use condoms or know the HIV status of your partner(s).  You take drugs by injection.  You are sexually active with a partner who has HIV. Talk with your health care provider about whether you are at high risk of being infected with HIV. If you choose to begin PrEP, you should first be tested for HIV. You should then be tested every 3 months for as long as you are taking PrEP.  PREGNANCY   If you are premenopausal and you may become pregnant, ask your health care provider about preconception counseling.  If you may become pregnant, take 400 to 800 micrograms (mcg) of folic acid every day.  If you want to prevent pregnancy, talk to your health care provider about birth control (contraception). OSTEOPOROSIS AND MENOPAUSE   Osteoporosis is a disease in which the bones lose minerals and strength with aging. This can result in serious bone fractures. Your risk for osteoporosis can be identified using a bone density scan.  If you are 58 years of age or older, or if you are at risk for osteoporosis and fractures, ask your health care provider if you should be screened.  Ask your health care provider whether you should take a calcium or vitamin D supplement to lower your risk for osteoporosis.  Menopause may have certain physical symptoms and risks.  Hormone replacement therapy may reduce some of these symptoms and risks. Talk to your health care provider about whether hormone replacement therapy is  right for you.  HOME CARE INSTRUCTIONS   Schedule regular health, dental, and eye exams.  Stay current with your immunizations.   Do not use any tobacco products including cigarettes, chewing tobacco, or electronic cigarettes.  If you are pregnant, do not drink alcohol.  If you are breastfeeding, limit how much and how often you drink alcohol.  Limit alcohol intake to no more than 1 drink per day for nonpregnant women. One drink equals 12 ounces of beer, 5 ounces of wine, or 1 ounces of hard liquor.  Do not use street drugs.  Do not share needles.  Ask your health care provider for help if you need support or information about quitting drugs.  Tell your health care provider if you often feel depressed.  Tell your health care provider if you have ever been abused or do not feel safe at home.   This information is not intended to replace advice given to you by your health care provider. Make sure you discuss any questions you have with your health care provider.   Document Released: 12/29/2010 Document Revised: 07/06/2014 Document Reviewed: 05/17/2013 Elsevier Interactive Patient Education [employer]. Menopause is a normal process in which your reproductive ability comes to an end. This process happens gradually over a span of months to years, usually between the ages of 28 and 16. Menopause is complete when you have missed 12 consecutive menstrual periods. It is important to talk with your health care provider about some of the most common conditions that affect postmenopausal women, such as heart disease, cancer, and bone loss (osteoporosis). Adopting a healthy lifestyle and getting preventive care can help to promote your health and wellness. Those actions can also lower your chances of developing some of these common conditions. WHAT SHOULD I KNOW ABOUT MENOPAUSE? During menopause, you may experience a number of symptoms, such as:  Moderate-to-severe hot  flashes.  Night sweats.  Decrease in sex drive.  Mood swings.  Headaches.  Tiredness.  Irritability.  Memory problems.  Insomnia. Choosing to treat or not to treat menopausal changes is an individual decision that you make with your health care provider. WHAT SHOULD I KNOW ABOUT HORMONE REPLACEMENT THERAPY AND SUPPLEMENTS? Hormone therapy products are effective for treating symptoms that are associated with menopause, such as hot flashes and night sweats. Hormone replacement carries certain risks, especially as you become older. If you are thinking about using estrogen or estrogen with progestin treatments, discuss the benefits and risks with your health care provider. WHAT SHOULD I KNOW ABOUT HEART DISEASE AND STROKE? Heart disease, heart attack, and stroke become more likely as you age. This may be due, in part, to the hormonal changes that your body experiences during menopause. These can affect how your body processes dietary fats, triglycerides, and cholesterol. Heart attack and stroke are both medical emergencies. There are many things that you can do to help prevent heart disease and stroke:  Have your blood pressure checked at least every 1-2 years. High blood pressure causes heart disease and increases the risk of stroke.  If you are 27-46 years old, ask your health care provider if you should take aspirin to prevent a heart attack or a stroke.  Do not use any tobacco products, including cigarettes, chewing tobacco, or electronic cigarettes. If you need help quitting, ask your health care provider.  It is important to eat a healthy diet and maintain a healthy weight.  Be sure to include plenty of vegetables, fruits, low-fat dairy products, and lean protein.  Avoid eating foods that are high in solid fats, added sugars, or salt (sodium).  Get regular exercise. This is one of the most important things that you can do for your health.  Try to exercise for at least 150  minutes each week. The type of exercise that you do should increase your heart rate and make you sweat. This is known as moderate-intensity exercise.  Try to do strengthening exercises at least twice each week. Do these in addition to the moderate-intensity exercise.  Know your numbers.Ask your health care provider to check your cholesterol and your blood glucose. Continue to have your blood tested as directed by your health care provider. WHAT SHOULD I KNOW ABOUT CANCER SCREENING? There are several types of cancer. Take the following steps to reduce your risk and to catch any cancer development as early as possible. Breast Cancer  Practice breast self-awareness.  This means understanding how your breasts normally appear and feel.  It also means doing regular breast self-exams. Let your health care provider know about any changes, no matter how small.  If you are 27 or older, have a clinician do a breast exam (clinical breast exam or CBE) every year. Depending on your age, family history, and medical history, it may be recommended that you also have a yearly breast X-ray (mammogram).  If you have a family history of breast cancer, talk with your health care provider about genetic screening.  If you are at high risk for breast cancer, talk with your health care provider about having an MRI and a mammogram every year.  Breast cancer (BRCA) gene test is recommended for women who have family members with BRCA-related cancers. Results of the assessment will determine the need for genetic counseling and BRCA1 and for BRCA2 testing. BRCA-related cancers include these types:  Breast. This occurs in males or females.  Ovarian.  Tubal. This may also be called fallopian tube cancer.  Cancer of the abdominal or pelvic lining (peritoneal cancer).  Prostate.  Pancreatic. Cervical, Uterine, and Ovarian Cancer Your health care provider may recommend that you be screened regularly for cancer of  the pelvic organs. These include your ovaries, uterus, and vagina. This screening involves a pelvic exam, which includes checking for microscopic changes to the surface of your cervix (Pap test).  For women ages 21-65, health care providers may recommend a pelvic exam and a Pap test every three years. For women ages 32-65, they may recommend the Pap test and pelvic exam, combined with testing for human papilloma virus (HPV), every five years. Some types of HPV increase your risk of cervical cancer. Testing for HPV may also be done on women of any age who have unclear Pap test results.  Other health care providers may not recommend any screening for nonpregnant women who are considered low risk for pelvic cancer and have no symptoms. Ask your health care provider if a screening pelvic exam is right for you.  If you have had past treatment for cervical cancer or a condition that could lead to cancer, you need Pap tests and screening for cancer for at least 20 years after your treatment. If Pap tests have been discontinued for you, your risk factors (such as having a new sexual partner) need to be reassessed to determine if you should start having screenings again. Some women have medical problems that increase the chance of getting cervical cancer. In these cases, your health care provider may recommend that you have screening and Pap tests more often.  If you have a family history of uterine cancer or ovarian cancer, talk with your health care provider about genetic screening.  If you have vaginal bleeding after reaching menopause, tell your health care provider.  There are currently no reliable tests available to screen for ovarian cancer. Lung Cancer Lung cancer screening is recommended for adults 36-75 years old who are at high risk for lung cancer because of a history of smoking. A yearly low-dose CT scan of the lungs is recommended if you:  Currently smoke.  Have a history of at least 30  pack-years of smoking and you currently smoke or have quit within the past 15 years. A pack-year is smoking an average of one pack of cigarettes per day for one year. Yearly screening should:  Continue until it has been 15 years since you quit.  Stop if you develop a health problem that would prevent you from having lung cancer treatment. Colorectal Cancer  This type of cancer can be detected and can often be prevented.  Routine colorectal cancer screening usually begins at age 40 and continues through age 64.  If you have risk factors for colon cancer, your health care provider may recommend that you be screened at an earlier age.  If you have a family history of colorectal cancer, talk with your health care provider about genetic screening.  Your health care provider may also recommend using home test kits to check for hidden blood in your stool.  A small camera at the end of a tube can be used to examine your colon directly (sigmoidoscopy or colonoscopy). This is done to check for the earliest forms of colorectal cancer.  Direct examination of the colon should be repeated every 5-10 years until age 34. However, if early forms of precancerous polyps or small growths are found or if you have a family history or genetic risk for colorectal cancer, you may need to be screened more often. Skin Cancer  Check your skin from head  to toe regularly.  Monitor any moles. Be sure to tell your health care provider:  About any new moles or changes in moles, especially if there is a change in a mole's shape or color.  If you have a mole that is larger than the size of a pencil eraser.  If any of your family members has a history of skin cancer, especially at a young age, talk with your health care provider about genetic screening.  Always use sunscreen. Apply sunscreen liberally and repeatedly throughout the day.  Whenever you are outside, protect yourself by wearing long sleeves, pants, a  wide-brimmed hat, and sunglasses. WHAT SHOULD I KNOW ABOUT OSTEOPOROSIS? Osteoporosis is a condition in which bone destruction happens more quickly than new bone creation. After menopause, you may be at an increased risk for osteoporosis. To help prevent osteoporosis or the bone fractures that can happen because of osteoporosis, the following is recommended:  If you are 89-31 years old, get at least 1,000 mg of calcium and at least 600 mg of vitamin D per day.  If you are older than age 44 but younger than age 13, get at least 1,200 mg of calcium and at least 600 mg of vitamin D per day.  If you are older than age 75, get at least 1,200 mg of calcium and at least 800 mg of vitamin D per day. Smoking and excessive alcohol intake increase the risk of osteoporosis. Eat foods that are rich in calcium and vitamin D, and do weight-bearing exercises several times each week as directed by your health care provider. WHAT SHOULD I KNOW ABOUT HOW MENOPAUSE AFFECTS [place]? Depression may occur at any age, but it is more common as you become older. Common symptoms of depression include:  Low or sad mood.  Changes in sleep patterns.  Changes in appetite or eating patterns.  Feeling an overall lack of motivation or enjoyment of activities that you previously enjoyed.  Frequent crying spells. Talk with your health care provider if you think that you are experiencing depression. WHAT SHOULD I KNOW ABOUT IMMUNIZATIONS? It is important that you get and maintain your immunizations. These include:  Tetanus, diphtheria, and pertussis (Tdap) booster vaccine.  Influenza every year before the flu season begins.  Pneumonia vaccine.  Shingles vaccine. Your health care provider may also recommend other immunizations.   This information is not intended to replace advice given to you by your health care provider. Make sure you discuss any questions you have with your health care provider.   Document  Released: 08/07/2005 Document Revised: 07/06/2014 Document Reviewed: 02/15/2014 Elsevier Interactive Patient Education [employer].

## 2015-10-28 NOTE — Assessment & Plan Note (Signed)
Due for repeat CT in 1 year

## 2015-10-28 NOTE — Assessment & Plan Note (Signed)
Under good control. Continue current regimen. Continue to monitor.  

## 2015-10-28 NOTE — Progress Notes (Signed)
BP 133/78 mmHg  Pulse 68  Temp(Src) 98.1 F (36.7 C)  Ht 5' 0.5" (1.537 m)  Wt 139 lb (63.05 kg)  BMI 26.69 kg/m2  SpO2 99%   Subjective:    Patient ID: Joanna Rodriguez, female    DOB: 01-Aug-1950, 65 y.o.   MRN: [id number]  HPI: Joanna Rodriguez is a 65 y.o. female presenting on 10/28/2015 for comprehensive medical examination. Current medical complaints include:  [employer] is saying she is not sleeping well. Does not want to do a sleep study at this time.   HYPERTENSION Hypertension status: controlled  Satisfied with current treatment? yes Duration of hypertension: chronic BP monitoring frequency:  not checking BP medication side effects:  no Medication compliance: excellent compliance Aspirin: no Recurrent headaches: no Visual changes: no Palpitations: no Dyspnea: no Chest pain: no Lower extremity edema: no Dizzy/lightheaded: no  HYPOTHYROIDISM Thyroid control status:controlled Satisfied with current treatment? yes Medication side effects: no Medication compliance: excellent compliance Recent dose adjustment:no Fatigue: no Cold intolerance: no Heat intolerance: no Weight gain: no Weight loss: no Constipation: no Diarrhea/loose stools: no Palpitations: no Lower extremity edema: no Anxiety/depressed mood: no  She currently lives with: Alone Menopausal Symptoms: no  Functional Status Survey: Is the patient deaf or have difficulty hearing?: Yes Does the patient have difficulty seeing, even when wearing glasses/contacts?: No Does the patient have difficulty concentrating, remembering, or making decisions?: No Does the patient have difficulty walking or climbing stairs?: Yes (gets dizzy and hold rails, does OK with holding) Does the patient have difficulty dressing or bathing?: No Does the patient have difficulty doing errands alone such as visiting a doctor's office or shopping?: No  Fall Risk  10/28/2015 06/25/2015  Falls in the past year? No No     Depression Screen Depression screen [facility] 2/9 10/28/2015 06/25/2015 05/20/2015  Decreased Interest 0 0 0  Down, Depressed, Hopeless 1 0 1  PHQ - 2 Score 1 0 1   Advanced Directives Does patient have a HCPOA?    yes If yes, name and contact information: Her son in law Does patient have a living will or MOST form?  yes  Past Medical History:  Past Medical History  Diagnosis Date  . Hypothyroid   . Genital herpes   . Crohn's disease ([place])   . Volvulus ([place])   . Arthritis   . Cancer [facility])     face  . COPD (chronic obstructive pulmonary disease) ([place])   . Kidney stones   . Spontaneous pneumothorax   . Osteopenia   . Personal history of tobacco use, presenting hazards to health 10/23/2015    Surgical History:  Past Surgical History  Procedure Laterality Date  . Volvulus reduction    . Tubal ligation    . Talc pleurodesis  07/2014    left side  . Thoracoscopy  07/2014    left side  . Breast excisional biopsy Bilateral     -  both in 1990's  . Appendectomy    . Abdominal hysterectomy      Medications:  Current Outpatient Prescriptions on File Prior to Visit  Medication Sig  . Alum & Mag Hydroxide-Simeth (ANTACID ANTI-GAS MAX STRENGTH PO) Take 1 each by mouth daily.  Marland Kitchen aspirin EC 81 MG tablet Take 81 mg by mouth daily.  . B Complex-Folic Acid (B COMPLEX-VITAMIN B12 PO) Take 1 tablet by mouth daily.  Marland Kitchen BLACK COHOSH PO Take by mouth daily.  . Calcium Carb-Cholecalciferol 600-800 MG-UNIT TABS Take by mouth 2 (  two) times daily.  Marland Kitchen Co-Enzyme Q-10 30 MG CAPS Take 1 capsule by mouth daily.  . Glucosamine-Chondroit-Vit C-Mn (SM GLUCOSAMINE/CHONDROITIN PO) Take 1,500 mg by mouth 2 (two) times daily.  Marland Kitchen ketoconazole (NIZORAL) 2 % shampoo Apply 1 application topically 2 (two) times a week.  . levothyroxine (SYNTHROID, LEVOTHROID) 100 MCG tablet Take 1 tablet (100 mcg total) by mouth daily.  . Melatonin 5 MG TABS Take 5 mg by mouth at bedtime as needed.  . mercaptopurine  (PURINETHOL) 50 MG tablet TAKE 1 AND 1/2 TABLETS EVERY DAY  . Multiple Vitamins-Minerals (MULTIVITAMIN WITH MINERALS) tablet Take 1 tablet by mouth daily.  . Omega-3 Fatty Acids (FISH OIL PO) Take 1,200 mg by mouth 2 (two) times daily.  . ranitidine (RANITIDINE ACID REDUCER) 75 MG tablet Take 150 mg by mouth 3 (three) times daily.   . Tiotropium Bromide Monohydrate (SPIRIVA RESPIMAT) 2.5 MCG/ACT AERS Inhale 2 puffs into the lungs daily. Gargle and rinse after each use  . traMADol (ULTRAM) 50 MG tablet TAKE 1 TABLET THREE TIMES DAILY   No current facility-administered medications on file prior to visit.    Allergies:  Allergies  Allergen Reactions  . Nsaids     Other reaction(s): Other (See Comments) Caused Crohn's flare up.    Social History:  Social History   Social History  . Marital Status: Divorced    Spouse Name: N/A  . Number of Children: N/A  . Years of Education: N/A   Occupational History  . 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 Smoking status: Former Smoker -- 1.00 packs/day for 53 years    Types: Cigarettes    Quit date: 06/29/2014  . Smokeless tobacco: Never Used     Comment: NOV 16,2015  . Alcohol Use: 0.0 oz/week    0 Standard drinks or equivalent per week     Comment: occasional  . Drug Use: No  . Sexual Activity: Not on file   Other Topics Concern  . Not on file   Social History Narrative   History  Smoking status  . Former Smoker -- 1.00 packs/day for 53 years  . Types: Cigarettes  . Quit date: 06/29/2014  Smokeless tobacco  . Never Used    Comment: NOV 16,2015   History  Alcohol Use  . 0.0 oz/week  . 0 Standard drinks or equivalent per week    Comment: occasional    Family History:  Family History  Problem Relation Age of Onset  . Hypertension Mother   . Hypothyroidism Mother   . Alcohol abuse Mother   . Breast cancer Mother 71  . Hyperlipidemia Mother   . Mental illness Mother   . Heart  attack Father   . Heart disease Father   . Glaucoma Father   . Colon cancer Father 61  . Diabetes Father   . Hypertension Father   . Heart disease Sister   . Hyperlipidemia Sister   . Hypertension Sister   . Lung disease Sister     Past medical history, surgical history, medications, allergies, family history and social history reviewed with patient today and changes made to appropriate areas of the chart.   Review of Systems  Constitutional: Negative.   HENT: Positive for hearing loss (chronic) and tinnitus. Negative for congestion, ear discharge, ear pain, nosebleeds and sore throat.   Eyes: Negative.   Respiratory: Positive for shortness of breath (with changes in weather). Negative for cough,  hemoptysis, sputum production, wheezing and stridor.   Cardiovascular: Positive for palpitations (at night mainly). Negative for chest pain, orthopnea, claudication, leg swelling and PND.  Gastrointestinal: Positive for heartburn and diarrhea (Due to crohns). Negative for nausea, vomiting, abdominal pain, constipation, blood in stool and melena.       Still having trouble with swallowing   Genitourinary: Negative.   Musculoskeletal: Negative.   Skin: Negative.   Neurological: Positive for dizziness (with certain head movements due to vertigo). Negative for tingling, tremors, sensory change, speech change, focal weakness, seizures, loss of consciousness and headaches.  Endo/Heme/Allergies: Negative for environmental allergies and polydipsia. Bruises/bleeds easily.  Psychiatric/Behavioral: Negative.     All other ROS negative except what is listed above and in the HPI.      Objective:    BP 133/78 mmHg  Pulse 68  Temp(Src) 98.1 F (36.7 C)  Ht 5' 0.5" (1.537 m)  Wt 139 lb (63.05 kg)  BMI 26.69 kg/m2  SpO2 99%  Wt Readings from Last 3 Encounters:  10/28/15 139 lb (63.05 kg)  10/25/15 139 lb (63.05 kg)  10/01/15 139 lb (63.05 kg)    Physical Exam  Constitutional: She is oriented  to person, place, and time. She appears well-developed and well-nourished. No distress.  HENT:  Head: Normocephalic and atraumatic.  Right Ear: Hearing and external ear normal.  Left Ear: Hearing and external ear normal.  Nose: Nose normal.  Mouth/Throat: Oropharynx is clear and moist. No oropharyngeal exudate.  Eyes: Conjunctivae, EOM and lids are normal. Pupils are equal, round, and reactive to light. Right eye exhibits no discharge. Left eye exhibits no discharge. No scleral icterus.  Neck: Normal range of motion. Neck supple. No JVD present. No tracheal deviation present. No thyromegaly present.  Cardiovascular: Normal rate, regular rhythm, normal heart sounds and intact distal pulses.  Exam reveals no gallop and no friction rub.   No murmur heard. Pulmonary/Chest: Effort normal and breath sounds normal. No stridor. No respiratory distress. She has no wheezes. She has no rales. She exhibits no tenderness. Right breast exhibits no inverted nipple, no mass, no nipple discharge, no skin change and no tenderness. Left breast exhibits no inverted nipple, no mass, no nipple discharge, no skin change and no tenderness. Breasts are symmetrical.  Abdominal: Soft. Bowel sounds are normal. She exhibits no distension and no mass. There is no tenderness. There is no rebound and no guarding.  Genitourinary:  Deferred with shared decision making  Musculoskeletal: Normal range of motion. She exhibits no edema or tenderness.  Lymphadenopathy:    She has no cervical adenopathy.  Neurological: She is alert and oriented to person, place, and time. She has normal reflexes. She displays normal reflexes. No cranial nerve deficit. She exhibits normal muscle tone. Coordination normal.  Skin: Skin is warm and intact. No rash noted. She is not diaphoretic. No erythema. No pallor.  Psychiatric: She has a normal mood and affect. Her speech is normal and behavior is normal. Judgment and thought content normal. Cognition  and memory are normal.  Nursing note and vitals reviewed.   Cognitive Testing - 6-CIT  Correct? Score   What year is it? yes 0 Yes = 0    No = 4  What month is it? yes 0 Yes = 0    No = 3  Remember:     [address]ia [address], [place], [place]     What time is it? yes 0 Yes = 0    No =  3  Count backwards from 20 to 1 yes 0 Correct = 0    1 error = 2   More than 1 error = 4  Say the months of the year in reverse. yes 0 Correct = 0    1 error = 2   More than 1 error = 4  What address did I ask you to remember? yes 4 Correct = 0  1 error = 2    2 error = 4    3 error = 6    4 error = 8    All wrong = 10       TOTAL SCORE  4/28   Interpretation:  Normal  Normal (0-7) Abnormal (8-28)   Results for orders placed or performed in visit on 07/24/15  Exercise Tolerance Test  Result Value Ref Range   Rest HR 85 bpm   Rest BP 140/78 mmHg   Exercise duration (min) 2 min   Exercise duration (sec) 43 sec   Estimated workload 4.6 METS   Peak HR 134 bpm   Peak BP 180/65 mmHg   MPHR 154 bpm   Percent HR 87 %   RPE        Assessment & Plan:   Problem List Items Addressed This Visit      Cardiovascular and Mediastinum   HTN (hypertension)    Under good control. Continue current regimen. Continue to monitor. Recheck 6 months.       Relevant Medications   lisinopril (PRINIVIL,ZESTRIL) 5 MG tablet   Other Relevant Orders   Comprehensive metabolic panel   Microalbumin, Urine Waived   UA/M w/rflx Culture, Routine     Respiratory   COPD (chronic obstructive pulmonary disease) (HCC)    Under good control. Continue current regimen. Continue to monitor. Recheck 6 months. Continue to follow with Dr. Stevenson Clinch as needed.         Digestive   B12 deficiency    Rechecking levels again today. Await results.       Relevant Orders   CBC with Differential/Platelet   Comprehensive metabolic panel   X54 and Folate Panel   CC (Crohn's colitis) [facility])    Referral to Dr. Allen Norris put back in. Call with any  concerns.       Relevant Orders   Comprehensive metabolic panel   UA/M w/rflx Culture, Routine   Ambulatory referral to General Surgery     Endocrine   Thyroid activity decreased    Rechecking levels again today. Await results.      Relevant Orders   Comprehensive metabolic panel   TSH     Musculoskeletal and Integument   Osteopenia    Continue diet and exercise. Continue to monitor.         Genitourinary   Genital herpes    Under good control. Continue current regimen. Continue to monitor.       Relevant Medications   acyclovir (ZOVIRAX) 400 MG tablet     Other   RESOLVED: Personal history of tobacco use, presenting hazards to health    Due for repeat CT in 1 year       Other Visit Diagnoses    Medicare annual wellness visit, subsequent    -  Primary    Preventative care discussed today. Call with any problems.     Relevant Orders    CBC with Differential/Platelet    Comprehensive metabolic panel    Lipid Panel w/o Chol/HDL Ratio    Microalbumin, Urine Waived  TSH    UA/M w/rflx Culture, Routine    B12 and Folate Panel    Screening for cholesterol level        Relevant Orders    Comprehensive metabolic panel    Lipid Panel w/o Chol/HDL Ratio        Preventative Services:  Health Risk Assessment and Personalized Prevention Plan: Done today Bone Mass Measurements: up to date Breast Cancer Screening: up to date CVD Screening:  Done today Cervical Cancer Screening: N/A Colon Cancer Screening:  up to date Depression Screening: done today Diabetes Screening: done today Glaucoma Screening: See your eye doctor Hepatitis B vaccine: N/A Hepatitis C screening:  up to date HIV Screening:  up to date Flu Vaccine:  up to date Lung cancer Screening: Up to date- due in 1 year Obesity Screening: Done today Pneumonia Vaccines (2):  up to date STI Screening: N/A  Follow up plan: Return in about 6 months (around 04/29/2016) for Follow up.   LABORATORY  TESTING:  - Pap smear: not applicable  IMMUNIZATIONS:   - Tdap: Tetanus vaccination status reviewed: last tetanus booster within 10 years. - Influenza: Up to date - Pneumovax: Up to date - Prevnar: Up to date - Zostavax vaccine: Refused  SCREENING: -Mammogram: Up to date  - Colonoscopy: Up to date  - Bone Density: Up to date  -Hearing Test: Ordered today   PATIENT COUNSELING:   Advised to take 1 mg of folate supplement per day if capable of pregnancy.   Sexuality: Discussed sexually transmitted diseases, partner selection, use of condoms, avoidance of unintended pregnancy  and contraceptive alternatives.   Advised to avoid cigarette smoking.  I discussed with the patient that most people either abstain from alcohol or drink within safe limits (<=14/week and <=4 drinks/occasion for males, <=7/weeks and <= 3 drinks/occasion for females) and that the risk for alcohol disorders and other health effects rises proportionally with the number of drinks per week and how often a drinker exceeds daily limits.  Discussed cessation/primary prevention of drug use and availability of treatment for abuse.   Diet: Encouraged to adjust caloric intake to maintain  or achieve ideal body weight, to reduce intake of dietary saturated fat and total fat, to limit sodium intake by avoiding high sodium foods and not adding table salt, and to maintain adequate dietary potassium and calcium preferably from fresh fruits, vegetables, and low-fat dairy products.    stressed the importance of regular exercise  Injury prevention: Discussed safety belts, safety helmets, smoke detector, smoking near bedding or upholstery.   Dental health: Discussed importance of regular tooth brushing, flossing, and dental visits.    NEXT PREVENTATIVE PHYSICAL DUE IN 1 YEAR. Return in about 6 months (around 04/29/2016) for Follow up.

## 2015-10-28 NOTE — Assessment & Plan Note (Signed)
Referral to Dr. Allen Norris put back in. Call with any concerns.

## 2015-10-29 ENCOUNTER — Encounter: Payer: Self-pay | Admitting: Family Medicine

## 2015-10-29 LAB — COMPREHENSIVE METABOLIC PANEL
A/G RATIO: 1.5 (ref 1.2–2.2)
ALK PHOS: 51 IU/L (ref 39–117)
ALT: 37 IU/L — AB (ref 0–32)
AST: 29 IU/L (ref 0–40)
Albumin: 3.6 g/dL (ref 3.6–4.8)
BILIRUBIN TOTAL: 0.3 mg/dL (ref 0.0–1.2)
BUN/Creatinine Ratio: 24 (ref 12–28)
BUN: 16 mg/dL (ref 8–27)
CHLORIDE: 104 mmol/L (ref 96–106)
CO2: 26 mmol/L (ref 18–29)
Calcium: 9.4 mg/dL (ref 8.7–10.3)
Creatinine, Ser: 0.68 mg/dL (ref 0.57–1.00)
GFR calc Af Amer: 105 mL/min/{1.73_m2} (ref >59)
GFR calc non Af Amer: 91 mL/min/{1.73_m2} (ref >59)
GLOBULIN, TOTAL: 2.4 g/dL (ref 1.5–4.5)
Glucose: 68 mg/dL (ref 65–99)
POTASSIUM: 4.7 mmol/L (ref 3.5–5.2)
SODIUM: 143 mmol/L (ref 134–144)
Total Protein: 6 g/dL (ref 6.0–8.5)

## 2015-10-29 LAB — CBC WITH DIFFERENTIAL/PLATELET
BASOS: 1 %
Basophils Absolute: 0 10*3/uL (ref 0.0–0.2)
EOS (ABSOLUTE): 0.1 10*3/uL (ref 0.0–0.4)
Eos: 1 %
Hematocrit: 36.7 % (ref 34.0–46.6)
Hemoglobin: 12.2 g/dL (ref 11.1–15.9)
IMMATURE GRANULOCYTES: 0 %
Immature Grans (Abs): 0 10*3/uL (ref 0.0–0.1)
LYMPHS ABS: 1.5 10*3/uL (ref 0.7–3.1)
Lymphs: 30 %
MCH: 34.7 pg — AB (ref 26.6–33.0)
MCHC: 33.2 g/dL (ref 31.5–35.7)
MCV: 104 fL — AB (ref 79–97)
MONOS ABS: 0.3 10*3/uL (ref 0.1–0.9)
Monocytes: 7 %
NEUTROS PCT: 61 %
Neutrophils Absolute: 3 10*3/uL (ref 1.4–7.0)
PLATELETS: 384 10*3/uL — AB (ref 150–379)
RBC: 3.52 x10E6/uL — ABNORMAL LOW (ref 3.77–5.28)
RDW: 15.4 % (ref 12.3–15.4)
WBC: 4.9 10*3/uL (ref 3.4–10.8)

## 2015-10-29 LAB — LIPID PANEL W/O CHOL/HDL RATIO
Cholesterol, Total: 141 mg/dL (ref 100–199)
HDL: 48 mg/dL (ref >39)
LDL Calculated: 49 mg/dL (ref 0–99)
Triglycerides: 221 mg/dL — ABNORMAL HIGH (ref 0–149)
VLDL Cholesterol Cal: 44 mg/dL — ABNORMAL HIGH (ref 5–40)

## 2015-10-29 LAB — TSH: TSH: 2.07 u[IU]/mL (ref 0.450–4.500)

## 2015-10-29 LAB — B12 AND FOLATE PANEL: Vitamin B-12: 480 pg/mL (ref 211–946)

## 2015-11-06 ENCOUNTER — Ambulatory Visit (INDEPENDENT_AMBULATORY_CARE_PROVIDER_SITE_OTHER): Payer: Commercial Managed Care - HMO | Admitting: Gastroenterology

## 2015-11-06 ENCOUNTER — Encounter: Payer: Self-pay | Admitting: Gastroenterology

## 2015-11-06 ENCOUNTER — Other Ambulatory Visit: Payer: Self-pay

## 2015-11-06 VITALS — BP 127/65 | HR 81 | Temp 99.2°F | Ht <= 58 in | Wt 142.0 lb

## 2015-11-06 DIAGNOSIS — R1310 Dysphagia, unspecified: Secondary | ICD-10-CM | POA: Diagnosis not present

## 2015-11-06 NOTE — Progress Notes (Signed)
Primary Care Physician: Park Liter, DO  Primary Gastroenterologist:  Dr. Lucilla Lame  Chief Complaint  Patient presents with  . Gastroesophageal Reflux  . Dysphagia    HPI: Joanna Rodriguez is a 65 y.o. female here for a new report of having hiccups area the patient states that she had a collapse along with chest tubes placed and pleurodesis. Approximate one month after this happened the patient states that she started to have episodes of hiccups that happened approximately 3 times a week. The patient states that she will have the feeling of food getting stuck in her esophagus that shortly after that she will have the hiccups. There is no report of any food getting stuck in her esophagus or unexplained weight loss.  Current Outpatient Prescriptions  Medication Sig Dispense Refill  . Alum & Mag Hydroxide-Simeth (ANTACID ANTI-GAS MAX STRENGTH PO) Take 1 each by mouth daily.    Marland Kitchen aspirin EC 81 MG tablet Take 81 mg by mouth daily.    . B Complex-Folic Acid (B COMPLEX-VITAMIN B12 PO) Take 1 tablet by mouth daily.    Marland Kitchen BLACK COHOSH PO Take by mouth daily.    . Calcium Carb-Cholecalciferol 600-800 MG-UNIT TABS Take by mouth 2 (two) times daily.    Marland Kitchen Co-Enzyme Q-10 30 MG CAPS Take 1 capsule by mouth daily.    . Glucosamine-Chondroit-Vit C-Mn (SM GLUCOSAMINE/CHONDROITIN PO) Take 1,500 mg by mouth 2 (two) times daily.    Marland Kitchen ketoconazole (NIZORAL) 2 % shampoo Apply 1 application topically 2 (two) times a week. 120 mL 0  . levothyroxine (SYNTHROID, LEVOTHROID) 100 MCG tablet Take 1 tablet (100 mcg total) by mouth daily. 90 tablet 3  . lisinopril (PRINIVIL,ZESTRIL) 5 MG tablet Take 1 tablet (5 mg total) by mouth daily. 90 tablet 1  . Melatonin 5 MG TABS Take 5 mg by mouth at bedtime as needed.    . mercaptopurine (PURINETHOL) 50 MG tablet TAKE 1 AND 1/2 TABLETS EVERY DAY 135 tablet 3  . Multiple Vitamins-Minerals (MULTIVITAMIN WITH MINERALS) tablet Take 1 tablet by mouth daily.    . Omega-3  Fatty Acids (FISH OIL PO) Take 1,200 mg by mouth 2 (two) times daily.    . ranitidine (RANITIDINE ACID REDUCER) 75 MG tablet Take 150 mg by mouth 3 (three) times daily.     . Tiotropium Bromide Monohydrate (SPIRIVA RESPIMAT) 2.5 MCG/ACT AERS Inhale 2 puffs into the lungs daily. Gargle and rinse after each use 3 Inhaler 1  . traMADol (ULTRAM) 50 MG tablet TAKE 1 TABLET THREE TIMES DAILY 270 tablet 1  . acyclovir (ZOVIRAX) 400 MG tablet Take 1 tablet (400 mg total) by mouth daily. (Patient not taking: Reported on 11/06/2015) 90 tablet 3   No current facility-administered medications for this visit.    Allergies as of 11/06/2015 - Review Complete 11/06/2015  Allergen Reaction Noted  . Nsaids  09/06/2014    ROS:  General: Negative for anorexia, weight loss, fever, chills, fatigue, weakness. ENT: Negative for hoarseness, difficulty swallowing , nasal congestion. CV: Negative for chest pain, angina, palpitations, dyspnea on exertion, peripheral edema.  Respiratory: Negative for dyspnea at rest, dyspnea on exertion, cough, sputum, wheezing.  GI: See history of present illness. GU:  Negative for dysuria, hematuria, urinary incontinence, urinary frequency, nocturnal urination.  Endo: Negative for unusual weight change.    Physical Examination:   BP 127/65 mmHg  Pulse 81  Temp(Src) 99.2 F (37.3 C) (Oral)  Ht 4' 10"  (1.473 m)  Wt 142 lb (64.411  kg)  BMI 29.69 kg/m2  General: Well-nourished, well-developed in no acute distress.  Eyes: No icterus. Conjunctivae pink. Mouth: Oropharyngeal mucosa moist and pink , no lesions erythema or exudate. Lungs: Clear to auscultation bilaterally. Non-labored. Heart: Regular rate and rhythm, no murmurs rubs or gallops.  Abdomen: Bowel sounds are normal, nontender, nondistended, no hepatosplenomegaly or masses, no abdominal bruits or hernia , no rebound or guarding.   Extremities: No lower extremity edema. No clubbing or deformities. Neuro: Alert and  oriented x 3.  Grossly intact. Skin: Warm and dry, no jaundice.   Psych: Alert and cooperative, normal mood and affect.  Labs:    Imaging Studies: Ct Chest Lung Ca Screen Low Dose W/o Cm  10/25/2015  CLINICAL DATA:  Former smoker, quit 15 months ago, 56 pack-year history. EXAM: CT CHEST WITHOUT CONTRAST LOW-DOSE FOR LUNG CANCER SCREENING TECHNIQUE: Multidetector CT imaging of the chest was performed following the standard protocol without IV contrast. COMPARISON:  08/17/2014. FINDINGS: Mediastinum/Nodes: No pathologically enlarged mediastinal or axillary lymph nodes. Hilar regions are difficult to definitively evaluate without IV contrast. Coronary artery calcification. Heart size normal. No pericardial effusion. Moderate hiatal hernia. Lungs/Pleura: Moderate to severe centrilobular emphysema. Evidence of talc pleurodesis in the left hemi thorax. No worrisome pulmonary nodules. No pleural fluid. Airway is unremarkable. Upper abdomen: Visualized portions of the liver, adrenal glands, left kidney, spleen and pancreas are grossly unremarkable. Moderate hiatal hernia. Musculoskeletal: No worrisome lytic or sclerotic lesions. IMPRESSION: 1. Lung-RADS Category 1, negative. Continue annual screening with low-dose chest CT without contrast in 12 months. 2. Talc pleurodesis in the left hemi thorax. 3. Coronary artery calcification. Electronically Signed   By: Lorin Picket M.D.   On: 10/25/2015 10:53    Assessment and Plan:   Joanna Rodriguez is a 65 y.o. y/o female who has hiccups and reports that she is doing well otherwise. There is no other complaints except some dysphagia that happens right before the hiccups started. The patient will be set up for an EGD and will be started on Dexilant as reflux can cause these symptoms. If the patient does not improve with this the patient may be tried on baclofen for her rhythmic diaphragmatic contractions causing her hiccups. The patient has been explained the  plan and agrees with it.   Note: This dictation was prepared with Dragon dictation along with smaller phrase technology. Any transcriptional errors that result from this process are unintentional.

## 2015-11-18 ENCOUNTER — Encounter: Payer: Self-pay | Admitting: *Deleted

## 2015-11-18 ENCOUNTER — Other Ambulatory Visit: Payer: Self-pay

## 2015-11-18 ENCOUNTER — Ambulatory Visit
Admission: RE | Admit: 2015-11-18 | Discharge: 2015-11-18 | Disposition: A | Discharge status: Home / Self Care | Payer: Commercial Managed Care - HMO | Source: Ambulatory Visit | Attending: Family Medicine | Admitting: Family Medicine

## 2015-11-18 ENCOUNTER — Telehealth: Payer: Self-pay | Admitting: Gastroenterology

## 2015-11-18 ENCOUNTER — Other Ambulatory Visit: Payer: Self-pay | Admitting: Family Medicine

## 2015-11-18 DIAGNOSIS — K2100 Gastro-esophageal reflux disease with esophagitis, without bleeding: Secondary | ICD-10-CM

## 2015-11-18 DIAGNOSIS — Z1231 Encounter for screening mammogram for malignant neoplasm of breast: Secondary | ICD-10-CM

## 2015-11-18 MED ORDER — DEXLANSOPRAZOLE 60 MG PO CPDR: 60.0000 mg | DELAYED_RELEASE_CAPSULE | Freq: Every day | ORAL | Status: DC

## 2015-11-18 NOTE — Telephone Encounter (Signed)
Rx for Dexilant sent to [facility] mail order per pt request. Pt notified this was sent.

## 2015-11-18 NOTE — Telephone Encounter (Signed)
Patient needs RX for dexilant sent to [facility] mail in.

## 2015-11-19 ENCOUNTER — Encounter: Payer: Self-pay | Admitting: Family Medicine

## 2015-11-20 ENCOUNTER — Other Ambulatory Visit: Payer: Self-pay

## 2015-11-20 ENCOUNTER — Other Ambulatory Visit: Payer: Self-pay | Admitting: Family Medicine

## 2015-11-20 MED ORDER — PANTOPRAZOLE SODIUM 40 MG PO TBEC: 40.0000 mg | DELAYED_RELEASE_TABLET | Freq: Every day | ORAL | Status: DC

## 2015-11-20 NOTE — Telephone Encounter (Signed)
Spoke with pt regarding Dexilant cost. Ok'd per Dr. Allen Norris to change to Pantoprazole 8m daily.

## 2015-11-20 NOTE — Discharge Instructions (Signed)

## 2015-11-20 NOTE — Telephone Encounter (Signed)
Dexilant was too expensive. Can you call her in something else? [employer] mail in

## 2015-11-21 ENCOUNTER — Encounter
Admission: RE | Disposition: A | Discharge status: Home / Self Care | Payer: Self-pay | Source: Ambulatory Visit | Attending: Gastroenterology

## 2015-11-21 ENCOUNTER — Ambulatory Visit
Admission: RE | Admit: 2015-11-21 | Discharge: 2015-11-21 | Disposition: A | Discharge status: Home / Self Care | Payer: Commercial Managed Care - HMO | Source: Ambulatory Visit | Attending: Gastroenterology | Admitting: Gastroenterology

## 2015-11-21 ENCOUNTER — Ambulatory Visit: Payer: Commercial Managed Care - HMO | Admitting: Anesthesiology

## 2015-11-21 DIAGNOSIS — K2950 Unspecified chronic gastritis without bleeding: Secondary | ICD-10-CM | POA: Diagnosis not present

## 2015-11-21 DIAGNOSIS — M419 Scoliosis, unspecified: Secondary | ICD-10-CM | POA: Diagnosis not present

## 2015-11-21 DIAGNOSIS — Z803 Family history of malignant neoplasm of breast: Secondary | ICD-10-CM | POA: Diagnosis not present

## 2015-11-21 DIAGNOSIS — K2100 Gastro-esophageal reflux disease with esophagitis, without bleeding: Secondary | ICD-10-CM | POA: Insufficient documentation

## 2015-11-21 DIAGNOSIS — K449 Diaphragmatic hernia without obstruction or gangrene: Secondary | ICD-10-CM | POA: Insufficient documentation

## 2015-11-21 DIAGNOSIS — Z87442 Personal history of urinary calculi: Secondary | ICD-10-CM | POA: Diagnosis not present

## 2015-11-21 DIAGNOSIS — Z833 Family history of diabetes mellitus: Secondary | ICD-10-CM | POA: Insufficient documentation

## 2015-11-21 DIAGNOSIS — Z87891 Personal history of nicotine dependence: Secondary | ICD-10-CM | POA: Insufficient documentation

## 2015-11-21 DIAGNOSIS — K2960 Other gastritis without bleeding: Secondary | ICD-10-CM | POA: Insufficient documentation

## 2015-11-21 DIAGNOSIS — Z836 Family history of other diseases of the respiratory system: Secondary | ICD-10-CM | POA: Insufficient documentation

## 2015-11-21 DIAGNOSIS — K222 Esophageal obstruction: Secondary | ICD-10-CM | POA: Insufficient documentation

## 2015-11-21 DIAGNOSIS — Z79899 Other long term (current) drug therapy: Secondary | ICD-10-CM | POA: Insufficient documentation

## 2015-11-21 DIAGNOSIS — K5090 Crohn's disease, unspecified, without complications: Secondary | ICD-10-CM | POA: Diagnosis not present

## 2015-11-21 DIAGNOSIS — Z8349 Family history of other endocrine, nutritional and metabolic diseases: Secondary | ICD-10-CM | POA: Diagnosis not present

## 2015-11-21 DIAGNOSIS — Z818 Family history of other mental and behavioral disorders: Secondary | ICD-10-CM | POA: Insufficient documentation

## 2015-11-21 DIAGNOSIS — Z7982 Long term (current) use of aspirin: Secondary | ICD-10-CM | POA: Diagnosis not present

## 2015-11-21 DIAGNOSIS — R1310 Dysphagia, unspecified: Secondary | ICD-10-CM | POA: Insufficient documentation

## 2015-11-21 DIAGNOSIS — I10 Essential (primary) hypertension: Secondary | ICD-10-CM | POA: Insufficient documentation

## 2015-11-21 DIAGNOSIS — Z811 Family history of alcohol abuse and dependence: Secondary | ICD-10-CM | POA: Insufficient documentation

## 2015-11-21 DIAGNOSIS — Z85828 Personal history of other malignant neoplasm of skin: Secondary | ICD-10-CM | POA: Diagnosis not present

## 2015-11-21 DIAGNOSIS — B9681 Helicobacter pylori [H. pylori] as the cause of diseases classified elsewhere: Secondary | ICD-10-CM | POA: Insufficient documentation

## 2015-11-21 DIAGNOSIS — R42 Dizziness and giddiness: Secondary | ICD-10-CM | POA: Diagnosis not present

## 2015-11-21 DIAGNOSIS — Z8249 Family history of ischemic heart disease and other diseases of the circulatory system: Secondary | ICD-10-CM | POA: Diagnosis not present

## 2015-11-21 DIAGNOSIS — E039 Hypothyroidism, unspecified: Secondary | ICD-10-CM | POA: Insufficient documentation

## 2015-11-21 DIAGNOSIS — K2970 Gastritis, unspecified, without bleeding: Secondary | ICD-10-CM | POA: Diagnosis not present

## 2015-11-21 DIAGNOSIS — M8580 Other specified disorders of bone density and structure, unspecified site: Secondary | ICD-10-CM | POA: Insufficient documentation

## 2015-11-21 DIAGNOSIS — Z90710 Acquired absence of both cervix and uterus: Secondary | ICD-10-CM | POA: Insufficient documentation

## 2015-11-21 DIAGNOSIS — M1990 Unspecified osteoarthritis, unspecified site: Secondary | ICD-10-CM | POA: Insufficient documentation

## 2015-11-21 DIAGNOSIS — Z800 Family history of malignant neoplasm of digestive organs: Secondary | ICD-10-CM | POA: Diagnosis not present

## 2015-11-21 DIAGNOSIS — J449 Chronic obstructive pulmonary disease, unspecified: Secondary | ICD-10-CM | POA: Diagnosis not present

## 2015-11-21 HISTORY — DX: Scoliosis, unspecified: M41.9

## 2015-11-21 HISTORY — PX: ESOPHAGOGASTRODUODENOSCOPY (EGD) WITH PROPOFOL: SHX5813

## 2015-11-21 HISTORY — DX: Essential (primary) hypertension: I10

## 2015-11-21 HISTORY — DX: Dizziness and giddiness: R42

## 2015-11-21 SURGERY — ESOPHAGOGASTRODUODENOSCOPY (EGD) WITH PROPOFOL
Anesthesia: Monitor Anesthesia Care | Wound class: Clean Contaminated

## 2015-11-21 MED ORDER — ACETAMINOPHEN 325 MG PO TABS: 325.0000 mg | ORAL_TABLET | ORAL | Status: DC | PRN

## 2015-11-21 MED ORDER — PROPOFOL 10 MG/ML IV BOLUS
INTRAVENOUS | Status: DC | PRN
  Administered 2015-11-21: 30 mg via INTRAVENOUS
  Administered 2015-11-21: 60 mg via INTRAVENOUS
  Administered 2015-11-21 (×3): 20 mg via INTRAVENOUS

## 2015-11-21 MED ORDER — LACTATED RINGERS IV SOLN
INTRAVENOUS | Status: DC
  Administered 2015-11-21: 09:00:00 via INTRAVENOUS

## 2015-11-21 MED ORDER — ACETAMINOPHEN 160 MG/5ML PO SOLN: 325.0000 mg | ORAL | Status: DC | PRN

## 2015-11-21 MED ORDER — ONDANSETRON HCL 4 MG/2ML IJ SOLN: 4.0000 mg | Freq: Once | INTRAMUSCULAR | Status: DC | PRN

## 2015-11-21 MED ORDER — GLYCOPYRROLATE 0.2 MG/ML IJ SOLN
INTRAMUSCULAR | Status: DC | PRN
  Administered 2015-11-21: 0.1 mg via INTRAVENOUS

## 2015-11-21 MED ORDER — STERILE WATER FOR IRRIGATION IR SOLN
Status: DC | PRN
  Administered 2015-11-21: 10:00:00

## 2015-11-21 MED ORDER — LIDOCAINE HCL (CARDIAC) 20 MG/ML IV SOLN
INTRAVENOUS | Status: DC | PRN
  Administered 2015-11-21: 40 mg via INTRAVENOUS

## 2015-11-21 SURGICAL SUPPLY — 31 items
BALLN DILATOR 10-12 8 (BALLOONS)
BALLN DILATOR 12-15 8 (BALLOONS)
BALLN DILATOR 15-18 8 (BALLOONS) ×2
BALLN DILATOR CRE 0-12 8 (BALLOONS)
BALLN DILATOR ESOPH 8 10 CRE (MISCELLANEOUS) IMPLANT
BALLOON DILATOR 12-15 8 (BALLOONS) IMPLANT
BALLOON DILATOR 15-18 8 (BALLOONS) ×1 IMPLANT
BALLOON DILATOR CRE 0-12 8 (BALLOONS) IMPLANT
BLOCK BITE 60FR ADLT L/F GRN (MISCELLANEOUS) ×2 IMPLANT
CANISTER SUCT 1200ML W/VALVE (MISCELLANEOUS) ×2 IMPLANT
CLIP HMST 235XBRD CATH ROT (MISCELLANEOUS) IMPLANT
CLIP RESOLUTION 360 11X235 (MISCELLANEOUS)
FCP ESCP3.2XJMB 240X2.8X (MISCELLANEOUS)
FORCEPS BIOP RAD 4 LRG CAP 4 (CUTTING FORCEPS) ×2 IMPLANT
FORCEPS BIOP RJ4 240 W/NDL (MISCELLANEOUS)
FORCEPS ESCP3.2XJMB 240X2.8X (MISCELLANEOUS) IMPLANT
GOWN CVR UNV OPN BCK APRN NK (MISCELLANEOUS) ×2 IMPLANT
GOWN ISOL THUMB LOOP REG UNIV (MISCELLANEOUS) ×2
INJECTOR VARIJECT VIN23 (MISCELLANEOUS) IMPLANT
KIT DEFENDO VALVE AND CONN (KITS) IMPLANT
KIT ENDO PROCEDURE OLY (KITS) ×2 IMPLANT
MARKER SPOT ENDO TATTOO 5ML (MISCELLANEOUS) IMPLANT
PAD GROUND ADULT SPLIT (MISCELLANEOUS) IMPLANT
SNARE SHORT THROW 13M SML OVAL (MISCELLANEOUS) IMPLANT
SNARE SHORT THROW 30M LRG OVAL (MISCELLANEOUS) IMPLANT
SPOT EX ENDOSCOPIC TATTOO (MISCELLANEOUS)
SYR INFLATION 60ML (SYRINGE) ×2 IMPLANT
TRAP ETRAP POLY (MISCELLANEOUS) IMPLANT
VARIJECT INJECTOR VIN23 (MISCELLANEOUS)
WATER STERILE IRR 250ML POUR (IV SOLUTION) ×2 IMPLANT
WIRE CRE 18-20MM 8CM F G (MISCELLANEOUS) IMPLANT

## 2015-11-21 NOTE — Anesthesia Postprocedure Evaluation (Signed)
Anesthesia Post Note  Patient: Joanna Rodriguez  Procedure(s) Performed: Procedure(s) (LRB): ESOPHAGOGASTRODUODENOSCOPY (EGD) WITH PROPOFOL with dialation (N/A)  Patient location during evaluation: PACU Anesthesia Type: MAC Level of consciousness: awake and alert and oriented Pain management: pain level controlled Vital Signs Assessment: post-procedure vital signs reviewed and stable Respiratory status: spontaneous breathing and nonlabored ventilation Cardiovascular status: stable Postop Assessment: no signs of nausea or vomiting and adequate PO intake Anesthetic complications: no    Estill Batten

## 2015-11-21 NOTE — Anesthesia Procedure Notes (Signed)
Procedure Name: MAC Performed by: Doristine Shehan Pre-anesthesia Checklist: Patient identified, Emergency Drugs available, Suction available, Timeout performed and Patient being monitored Patient Re-evaluated:Patient Re-evaluated prior to inductionOxygen Delivery Method: Nasal cannula Placement Confirmation: positive ETCO2       

## 2015-11-21 NOTE — Transfer of Care (Signed)
Immediate Anesthesia Transfer of Care Note  Patient: Joanna Rodriguez  Procedure(s) Performed: Procedure(s): ESOPHAGOGASTRODUODENOSCOPY (EGD) WITH PROPOFOL with dialation (N/A)  Patient Location: PACU  Anesthesia Type: MAC  Level of Consciousness: awake, alert  and patient cooperative  Airway and Oxygen Therapy: Patient Spontanous Breathing and Patient connected to supplemental oxygen  Post-op Assessment: Post-op Vital signs reviewed, Patient's Cardiovascular Status Stable, Respiratory Function Stable, Patent Airway and No signs of Nausea or vomiting  Post-op Vital Signs: Reviewed and stable  Complications: No apparent anesthesia complications

## 2015-11-21 NOTE — Anesthesia Preprocedure Evaluation (Signed)
Anesthesia Evaluation   Patient awake    Reviewed: Allergy & Precautions, NPO status , Patient's Chart, lab work & pertinent test results  Airway Mallampati: I  TM Distance: >3 FB Neck ROM: Full    Dental no notable dental hx.    Pulmonary COPD,  COPD inhaler, former smoker,    Pulmonary exam normal        Cardiovascular hypertension, Pt. on medications Normal cardiovascular exam     Neuro/Psych negative neurological ROS  negative psych ROS   GI/Hepatic Neg liver ROS, GERD  Medicated and Controlled,Crohn's Disease   Endo/Other  Hypothyroidism   Renal/GU negative Renal ROS     Musculoskeletal  (+) Arthritis , Osteoarthritis,    Abdominal   Peds  Hematology negative hematology ROS (+)   Anesthesia Other Findings   Reproductive/Obstetrics                             Anesthesia Physical Anesthesia Plan  ASA: II  Anesthesia Plan: MAC   Post-op Pain Management:    Induction: Intravenous  Airway Management Planned:   Additional Equipment:   Intra-op Plan:   Post-operative Plan:   Informed Consent: I have reviewed the patients History and Physical, chart, labs and discussed the procedure including the risks, benefits and alternatives for the proposed anesthesia with the patient or authorized representative who has indicated his/her understanding and acceptance.     Plan Discussed with: CRNA  Anesthesia Plan Comments:         Anesthesia Quick Evaluation

## 2015-11-21 NOTE — H&P (Signed)
[facility]  [address], [place], [place] [address] Phone: [phone] Fax : [phone]  Primary Care Physician:  Park Liter, DO Primary Gastroenterologist:  Dr. Allen Norris  Pre-Procedure History & Physical: HPI:  Joanna Rodriguez is a 65 y.o. female is here for an endoscopy.   Past Medical History  Diagnosis Date  . Hypothyroid   . Genital herpes   . Crohn's disease ([place])   . Volvulus ([place])   . Arthritis   . Cancer [facility])     face  . COPD (chronic obstructive pulmonary disease) ([place])   . Kidney stones   . Spontaneous pneumothorax   . Osteopenia   . Personal history of tobacco use, presenting hazards to health 10/23/2015  . Hypertension   . Scoliosis   . Vertigo     last episode 11/14/15    Past Surgical History  Procedure Laterality Date  . Volvulus reduction    . Tubal ligation    . Talc pleurodesis  07/2014    left side  . Thoracoscopy  07/2014    left side  . Breast excisional biopsy Bilateral     -  both in 1990's  . Appendectomy    . Abdominal hysterectomy      Prior to Admission medications   Medication Sig Start Date End Date Taking? Authorizing Provider  acyclovir (ZOVIRAX) 400 MG tablet Take 1 tablet (400 mg total) by mouth daily. Patient taking differently: Take 400 mg by mouth daily. Takes as needed 10/28/15  Yes Megan P Johnson, DO  aspirin EC 81 MG tablet Take 81 mg by mouth daily.   Yes Historical Provider, MD  B Complex-Folic Acid (B COMPLEX-VITAMIN B12 PO) Take 1 tablet by mouth daily. 06/01/14  Yes Historical Provider, MD  BLACK COHOSH PO Take by mouth daily.   Yes Historical Provider, MD  Calcium Carb-Cholecalciferol 600-800 MG-UNIT TABS Take by mouth 2 (two) times daily.   Yes Historical Provider, MD  Co-Enzyme Q-10 30 MG CAPS Take 1 capsule by mouth daily. 03/10/14  Yes Historical Provider, MD  dexlansoprazole (DEXILANT) 60 MG capsule Take 1 capsule (60 mg total) by mouth daily. 11/18/15  Yes Lucilla Lame, MD    Glucosamine-Chondroit-Vit C-Mn (SM GLUCOSAMINE/CHONDROITIN PO) Take 1,500 mg by mouth 2 (two) times daily.   Yes Historical Provider, MD  ketoconazole (NIZORAL) 2 % shampoo Apply 1 application topically 2 (two) times a week. 10/01/15  Yes Megan P Johnson, DO  levothyroxine (SYNTHROID, LEVOTHROID) 100 MCG tablet TAKE 1 TABLET BY MOUTH DAILY  11/20/15  Yes Megan P Johnson, DO  lisinopril (PRINIVIL,ZESTRIL) 5 MG tablet Take 1 tablet (5 mg total) by mouth daily. 10/28/15  Yes Megan P Johnson, DO  Melatonin 5 MG TABS Take 5 mg by mouth at bedtime as needed. 08/03/14  Yes Historical Provider, MD  mercaptopurine (PURINETHOL) 50 MG tablet TAKE 1 AND 1/2 TABLETS EVERY DAY 07/18/15  Yes Lucilla Lame, MD  Multiple Vitamins-Minerals (MULTIVITAMIN WITH MINERALS) tablet Take 1 tablet by mouth daily. 03/10/14  Yes Historical Provider, MD  Omega-3 Fatty Acids (FISH OIL PO) Take 1,200 mg by mouth 2 (two) times daily.   Yes Historical Provider, MD  Tiotropium Bromide Monohydrate (SPIRIVA RESPIMAT) 2.5 MCG/ACT AERS Inhale 2 puffs into the lungs daily. Gargle and rinse after each use 08/26/15  Yes Vishal Mungal, MD  traMADol (ULTRAM) 50 MG tablet TAKE 1 TABLET THREE TIMES DAILY 10/16/15  Yes Lucilla Lame, MD  Alum & Mag Hydroxide-Simeth (ANTACID ANTI-GAS MAX STRENGTH PO) Take  1 each by mouth daily. Reported on 11/21/2015 12/11/14   Historical Provider, MD  pantoprazole (PROTONIX) 40 MG tablet Take 1 tablet (40 mg total) by mouth daily. Patient not taking: Reported on 11/21/2015 11/20/15   Lucilla Lame, MD    Allergies as of 11/06/2015 - Review Complete 11/06/2015  Allergen Reaction Noted  . Nsaids  09/06/2014    Family History  Problem Relation Age of Onset  . Hypertension Mother   . Hypothyroidism Mother   . Alcohol abuse Mother   . Breast cancer Mother 50  . Hyperlipidemia Mother   . Mental illness Mother   . Heart attack Father   . Heart disease Father   . Glaucoma Father   . Colon cancer Father 47  . Diabetes Father    . Hypertension Father   . Heart disease Sister   . Hyperlipidemia Sister   . Hypertension Sister   . Lung disease Sister     Social History   Social History  . Marital Status: Divorced    Spouse Name: N/A  . Number of Children: N/A  . Years of Education: N/A   Occupational History  . 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 Smoking status: Former Smoker -- 1.00 packs/day for 53 years    Types: Cigarettes    Quit date: 06/29/2014  . Smokeless tobacco: Never Used     Comment: NOV 16,2015  . Alcohol Use: 0.0 oz/week    0 Standard drinks or equivalent per week     Comment: occasional - 2-3x/yr  . Drug Use: No  . Sexual Activity: Not on file   Other Topics Concern  . Not on file   Social History Narrative    Review of Systems: See HPI, otherwise negative ROS  Physical Exam: BP 152/82 mmHg  Pulse 72  Temp(Src) 97.7 F (36.5 C) (Temporal)  Ht 4' 10"  (1.473 m)  Wt 140 lb (63.504 kg)  BMI 29.27 kg/m2  SpO2 100% General:   Alert,  pleasant and cooperative in NAD Head:  Normocephalic and atraumatic. Neck:  Supple; no masses or thyromegaly. Lungs:  Clear throughout to auscultation.    Heart:  Regular rate and rhythm. Abdomen:  Soft, nontender and nondistended. Normal bowel sounds, without guarding, and without rebound.   Neurologic:  Alert and  oriented x4;  grossly normal neurologically.  Impression/Plan: Joanna Rodriguez is here for an endoscopy to be performed for dysphagia  Risks, benefits, limitations, and alternatives regarding  endoscopy have been reviewed with the patient.  Questions have been answered.  All parties agreeable.   Lucilla Lame, MD  11/21/2015, 8:56 AM

## 2015-11-21 NOTE — Op Note (Signed)
[facility] Patient Name: Joanna Rodriguez Procedure Date: 11/21/2015 9:43 AM MRN: [id number] Account #: [id number] Date of Birth: Jan 30, 1951 Admit Type: Outpatient Age: 65 Room: [facility] OR ROOM 01 Gender: Female Note Status: Finalized Procedure:            Upper GI endoscopy Indications:          Dysphagia Providers:            Lucilla Lame, MD Referring MD:         Valerie Roys (Referring MD) Medicines:            Propofol per Anesthesia Complications:        No immediate complications. Procedure:            Pre-Anesthesia Assessment:                       - Prior to the procedure, a History and Physical was                        performed, and patient medications and allergies were                        reviewed. The patient's tolerance of previous                        anesthesia was also reviewed. The risks and benefits of                        the procedure and the sedation options and risks were                        discussed with the patient. All questions were                        answered, and informed consent was obtained. Prior                        Anticoagulants: The patient has taken no previous                        anticoagulant or antiplatelet agents. ASA Grade                        Assessment: II - A patient with mild systemic disease.                        After reviewing the risks and benefits, the patient was                        deemed in satisfactory condition to undergo the                        procedure.                       After obtaining informed consent, the endoscope was                        passed under direct vision. Throughout the procedure,  the patient's blood pressure, pulse, and oxygen                        saturations were monitored continuously. The Olympus                        GIF H180J endoscope (S#: [id number]) was introduced                        through the  mouth, and advanced to the second part of                        duodenum. The upper GI endoscopy was accomplished                        without difficulty. The patient tolerated the procedure                        well. Findings:      LA Grade A (one or more mucosal breaks less than 5 mm, not extending       between tops of 2 mucosal folds) esophagitis with no bleeding was found       at the gastroesophageal junction.      A medium-sized hiatal hernia was present.      One mild benign-appearing, intrinsic stenosis was found at the       gastroesophageal junction. And was traversed. A TTS dilator was passed       through the scope. Dilation with a 15-16.5-18 mm balloon dilator was       performed to 18 mm. The dilation site was examined and showed complete       resolution of luminal narrowing.      Localized minimal inflammation characterized by erythema was found in       the gastric antrum. Biopsies were taken with a cold forceps for       histology.      The examined duodenum was normal. Impression:           - LA Grade A reflux esophagitis.                       - Medium-sized hiatal hernia.                       - Benign-appearing esophageal stenosis. Dilated.                       - Gastritis. Biopsied.                       - Normal examined duodenum. Recommendation:       - Await pathology results. Procedure Code(s):    --- Professional ---                       [phone], Esophagogastroduodenoscopy, flexible, transoral;                        with transendoscopic balloon dilation of esophagus                        (less than 30 mm diameter)  [id number], Esophagogastroduodenoscopy, flexible, transoral;                        with biopsy, single or multiple Diagnosis Code(s):    --- Professional ---                       R13.10, Dysphagia, unspecified                       K21.0, Gastro-esophageal reflux disease with esophagitis                       K22.2,  Esophageal obstruction                       K29.70, Gastritis, unspecified, without bleeding CPT copyright 2016 American Medical Association. All rights reserved. The codes documented in this report are preliminary and upon coder review may  be revised to meet current compliance requirements. Lucilla Lame, MD 11/21/2015 10:04:30 AM This report has been signed electronically. Number of Addenda: 0 Note Initiated On: 11/21/2015 9:43 AM Total Procedure Duration: 0 hours 4 minutes 26 seconds       [facility]

## 2015-11-29 ENCOUNTER — Encounter: Payer: Self-pay | Admitting: Gastroenterology

## 2015-12-02 ENCOUNTER — Encounter: Payer: Self-pay | Admitting: Gastroenterology

## 2015-12-03 ENCOUNTER — Other Ambulatory Visit: Payer: Self-pay

## 2015-12-03 DIAGNOSIS — A048 Other specified bacterial intestinal infections: Secondary | ICD-10-CM

## 2015-12-03 MED ORDER — CLARITHROMYCIN 500 MG PO TABS: 500.0000 mg | ORAL_TABLET | Freq: Two times a day (BID) | ORAL | Status: DC

## 2015-12-03 MED ORDER — AMOXICILLIN 500 MG PO CAPS: 1000.0000 mg | ORAL_CAPSULE | Freq: Two times a day (BID) | ORAL | Status: DC

## 2015-12-13 ENCOUNTER — Other Ambulatory Visit: Payer: Self-pay

## 2015-12-13 DIAGNOSIS — B373 Candidiasis of vulva and vagina: Secondary | ICD-10-CM

## 2015-12-13 DIAGNOSIS — B3731 Acute candidiasis of vulva and vagina: Secondary | ICD-10-CM

## 2015-12-13 MED ORDER — FLUCONAZOLE 150 MG PO TABS
150.0000 mg | ORAL_TABLET | Freq: Every day | ORAL | Status: DC
Start: 2015-12-13 — End: 2016-02-05

## 2015-12-13 MED ORDER — FLUCONAZOLE 150 MG PO TABS: 150.0000 mg | ORAL_TABLET | Freq: Every day | ORAL | Status: DC

## 2016-01-28 ENCOUNTER — Telehealth: Payer: Self-pay

## 2016-01-28 ENCOUNTER — Other Ambulatory Visit: Payer: Self-pay

## 2016-01-28 DIAGNOSIS — A048 Other specified bacterial intestinal infections: Secondary | ICD-10-CM

## 2016-01-28 NOTE — Telephone Encounter (Signed)
Talked with Joanna Rodriguez about testing for a stool sample. Her 6 weeks off the antibiotics will be tomorrow. Patient wants to know where she can get her cup for her stool sample?

## 2016-01-28 NOTE — Telephone Encounter (Signed)
Contacted pt to inform her she can stop by [facility] or [facility] to pick up stool containers for h pylori recheck. Order has been placed.

## 2016-01-30 DIAGNOSIS — B9681 Helicobacter pylori [H. pylori] as the cause of diseases classified elsewhere: Secondary | ICD-10-CM | POA: Diagnosis not present

## 2016-01-30 DIAGNOSIS — Z1629 Resistance to other single specified antibiotic: Secondary | ICD-10-CM | POA: Diagnosis not present

## 2016-02-01 LAB — H. PYLORI ANTIGEN, STOOL: H PYLORI AG STL: POSITIVE — AB

## 2016-02-03 ENCOUNTER — Encounter: Payer: Self-pay | Admitting: Gastroenterology

## 2016-02-05 ENCOUNTER — Other Ambulatory Visit: Payer: Self-pay

## 2016-02-05 MED ORDER — TETRACYCLINE HCL 500 MG PO CAPS: 500.0000 mg | ORAL_CAPSULE | Freq: Four times a day (QID) | ORAL | 0 refills | Status: DC

## 2016-02-05 MED ORDER — FLUCONAZOLE 150 MG PO TABS: 150.0000 mg | ORAL_TABLET | Freq: Every day | ORAL | 0 refills | Status: DC

## 2016-02-05 MED ORDER — METRONIDAZOLE 250 MG PO TABS: 250.0000 mg | ORAL_TABLET | Freq: Four times a day (QID) | ORAL | 0 refills | Status: DC

## 2016-02-07 ENCOUNTER — Telehealth: Payer: Self-pay

## 2016-02-07 ENCOUNTER — Ambulatory Visit (INDEPENDENT_AMBULATORY_CARE_PROVIDER_SITE_OTHER): Payer: Commercial Managed Care - HMO | Admitting: Internal Medicine

## 2016-02-07 ENCOUNTER — Other Ambulatory Visit: Payer: Self-pay | Admitting: Family Medicine

## 2016-02-07 ENCOUNTER — Encounter: Payer: Self-pay | Admitting: Internal Medicine

## 2016-02-07 VITALS — BP 130/78 | HR 87 | Ht <= 58 in | Wt 140.0 lb

## 2016-02-07 DIAGNOSIS — K2970 Gastritis, unspecified, without bleeding: Secondary | ICD-10-CM

## 2016-02-07 DIAGNOSIS — J432 Centrilobular emphysema: Secondary | ICD-10-CM

## 2016-02-07 NOTE — Telephone Encounter (Signed)
Patient called and needed authorizations to see Dr. Elvina Mattes at [facility].  Auth # [id number]. [facility] notified and they will notify patient when they call and notify her of her appointment.

## 2016-02-07 NOTE — Telephone Encounter (Signed)
Spoke with patient at this time. She stated she went to the pharmacy to pick up the medicine that Ginger had called in on 02/05/16 and that her cost was over 200.00 and she draws social security and cannot afford it.    I let her know that Ginger would be back on Monday and would advise patient on what else she could try.   Instructed patient to call around to different pharmacies and get prices as she may be able to get it cheaper. If so her pharmacy can transfer the prescription.  Patient states she has worked for [employer] for 5 years and they seem to be the cheapest, however she would call around and see if she can find it cheaper elsewhere. Otherwise patient will wait til Monday when Ginger comes back.

## 2016-02-07 NOTE — Patient Instructions (Addendum)
Follow up with Dr. Stevenson Clinch in:6 months - cont with spiriva. We will give your 90 day refills (3 refills).  - cont with allergy control - cont with GERD tx has directed by your GI physician - cont with diet and exercise

## 2016-02-07 NOTE — Progress Notes (Signed)
MRN# [id number] Joanna Rodriguez [birth year]   CC: Chief Complaint  Patient presents with  . Follow-up    20morov.       Brief History: 09/03/14 This is a pleasant 65year old female referred by Dr. TDurwin Norafor COPD evaluation and workup along with episodes of 2 spontaneous pneumothorax in the last 2 months. Below is her smoking and history of COPD: - 1 episode of bronchitis per year, not every year - No intubations prior to recent hospitalization - No childhood asthma - Pet dog at home - Quit smoking Nov 2015 - No seasonal allergies - Left chest wall tenderness - Still with DOE - can walk to mailbox, or about 3 car lenghts  Patient stated that in Nov 2015 she had a bad "bronchitis" for which he continued to coughing spells and sob, in Jan 2015 had significant SOB, CXR showed L pneumothorax. Had chest tube placed at time, then in Feb 2015, had a another spontaneous pneumothorax which required chest tube placed along with talc pleurodesis.  During per talc pleurodesis procedure Dr. OGenevive Binoted a single bleb on the left side.  Currently patient states that she is doing well, still with shortness of breath and cough, but overall with improvement.  She is currently not smoking  PLAN - stop tobacco, pfts, 629m   Events since last clinic visit:  patient presents for follow-up visit today of her COPD Patient has been using [employer] with continued stable and good improvement. Attends the gym 4 times per week.  No cough, worsening sob, or fever.  She also stated since her last visit, she's been diagnosed with H. pylori and is currently on antibiotic regimen per GI.  Medication:      Review of Systems: Gen:  Denies  fever, sweats, chills HEENT: Denies blurred vision, double vision, ear pain, eye pain, hearing loss, nose bleeds, sore throat Cvc:  No dizziness, chest pain or heaviness Resp:   Admits to: Mild dry cough (chornic - improving).  Gi: Denies swallowing difficulty,  stomach pain, nausea or vomiting, diarrhea, constipation, bowel incontinence Gu:  Denies bladder incontinence, burning urine Ext:   No Joint pain, stiffness or swelling Skin: No skin rash, easy bruising or bleeding or hives Endoc:  No polyuria, polydipsia , polyphagia or weight change Other:  All other systems negative  Allergies:  Nsaids  Physical Examination:  VS: BP 130/78 (BP Location: Left Arm, Cuff Size: Normal)   Pulse 87   Ht 4' 10"  (1.473 m)   Wt 140 lb (63.5 kg)   SpO2 95%   BMI 29.26 kg/m   General Appearance: No distress  HEENT: PERRLA, no ptosis, no other lesions noticed Pulmonary:normal breath sounds., diaphragmatic excursion normal.No wheezing, No rales   Cardiovascular:  Normal S1,S2.  No m/r/g.     Abdomen:Exam: Benign, Soft, non-tender, No masses  Skin:   warm, no rashes, no ecchymosis  Extremities: normal, no cyanosis, clubbing, warm with normal capillary refill.     Assessment and Plan: [age]ear old female past medical history of moderate COPD, pulmonary bullae, pneumothorax, status post thorascopy presented for follow-up visit COPD (chronic obstructive pulmonary disease) Clinically patient with  COPD, and with obstruction noted on her pulmonary function testing. Patient was educated on pneumothorax and COPD, she was noted to have 1 bullae during her last hospitalization, but with severe emphysema noted on a CAT scan, and she is at increased risk for developing more bullae in the future (especially if she resumes smoking).  She states she's  back to baseline breathing currently, walking about 10-15 miles per day, and has stopped smoking completely. Was having moderate to severe reflux in the evenings with Advair and Qvar, has stopped all ICS.  Decided not to pursue Arnuity due to cost.  Currently on Spiriva, with good symptom control, but very costly  Plan: - cont with current dose Spiriva, - Continue to  avoid tobacco. - cont with diet and exercise, daily  walking.         Gastritis Diagnosed with H. pylori by GI. Currently following with them. Currently on antibiotic and PPI regimen   Updated Medication List Outpatient Encounter Prescriptions as of 02/07/2016  Medication Sig  . acyclovir (ZOVIRAX) 400 MG tablet Take 1 tablet (400 mg total) by mouth daily. (Patient taking differently: Take 400 mg by mouth daily. Takes as needed)  . Alum & Mag Hydroxide-Simeth (ANTACID ANTI-GAS MAX STRENGTH PO) Take 1 each by mouth daily. Reported on 11/21/2015  . aspirin EC 81 MG tablet Take 81 mg by mouth daily.  . B Complex-Folic Acid (B COMPLEX-VITAMIN B12 PO) Take 1 tablet by mouth daily.  Marland Kitchen BLACK COHOSH PO Take by mouth daily.  . Calcium Carb-Cholecalciferol 600-800 MG-UNIT TABS Take by mouth 2 (two) times daily.  . clarithromycin (BIAXIN) 500 MG tablet Take 1 tablet (500 mg total) by mouth 2 (two) times daily.  Marland Kitchen Co-Enzyme Q-10 30 MG CAPS Take 1 capsule by mouth daily.  Marland Kitchen dexlansoprazole (DEXILANT) 60 MG capsule Take 1 capsule (60 mg total) by mouth daily.  . fluconazole (DIFLUCAN) 150 MG tablet Take 1 tablet (150 mg total) by mouth daily. Take 1 tablet now and one 3 days later  . Glucosamine-Chondroit-Vit C-Mn (SM GLUCOSAMINE/CHONDROITIN PO) Take 1,500 mg by mouth 2 (two) times daily.  Marland Kitchen ketoconazole (NIZORAL) 2 % shampoo Apply 1 application topically 2 (two) times a week.  . levothyroxine (SYNTHROID, LEVOTHROID) 100 MCG tablet TAKE 1 TABLET BY MOUTH DAILY   . lisinopril (PRINIVIL,ZESTRIL) 5 MG tablet Take 1 tablet (5 mg total) by mouth daily.  . Melatonin 5 MG TABS Take 5 mg by mouth at bedtime as needed.  . mercaptopurine (PURINETHOL) 50 MG tablet TAKE 1 AND 1/2 TABLETS EVERY DAY  . metroNIDAZOLE (FLAGYL) 250 MG tablet Take 1 tablet (250 mg total) by mouth 4 (four) times daily.  . Multiple Vitamins-Minerals (MULTIVITAMIN WITH MINERALS) tablet Take 1 tablet by mouth daily.  . Omega-3 Fatty Acids (FISH OIL PO) Take 1,200 mg by mouth 2 (two)  times daily.  . pantoprazole (PROTONIX) 40 MG tablet Take 1 tablet (40 mg total) by mouth daily.  Marland Kitchen tetracycline (ACHROMYCIN,SUMYCIN) 500 MG capsule Take 1 capsule (500 mg total) by mouth 4 (four) times daily.  . Tiotropium Bromide Monohydrate (SPIRIVA RESPIMAT) 2.5 MCG/ACT AERS Inhale 2 puffs into the lungs daily. Gargle and rinse after each use  . traMADol (ULTRAM) 50 MG tablet TAKE 1 TABLET THREE TIMES DAILY  . [DISCONTINUED] amoxicillin (AMOXIL) 500 MG capsule Take 2 capsules (1,000 mg total) by mouth 2 (two) times daily. (Patient not taking: Reported on 02/07/2016)   No facility-administered encounter medications on file as of 02/07/2016.     Orders for this visit: No orders of the defined types were placed in this encounter.   Thank  you for the visitation and for allowing  [facility] to assist in the care of your patient. Our recommendations are noted above.  Please contact us if we can be of further service.  Joanna Rodriguez  Stevenson Clinch, MD [facility] Office Number: [phone]

## 2016-02-07 NOTE — Assessment & Plan Note (Signed)
Diagnosed with H. pylori by GI. Currently following with them. Currently on antibiotic and PPI regimen

## 2016-02-07 NOTE — Assessment & Plan Note (Addendum)
Clinically patient with  COPD, and with obstruction noted on her pulmonary function testing. Patient was educated on pneumothorax and COPD, she was noted to have 1 bullae during her last hospitalization, but with severe emphysema noted on a CAT scan, and she is at increased risk for developing more bullae in the future (especially if she resumes smoking).  She states she's back to baseline breathing currently, walking about 10-15 miles per day, and has stopped smoking completely. Was having moderate to severe reflux in the evenings with Advair and Qvar, has stopped all ICS.  Decided not to pursue Arnuity due to cost.  Currently on Spiriva, with good symptom control, but very costly  Plan: - cont with current dose Spiriva, - Continue to  avoid tobacco. - cont with diet and exercise, daily walking.

## 2016-02-10 NOTE — Telephone Encounter (Signed)
-----   Message from Celene Kras, [place] sent at 02/07/2016  9:52 AM EDT ----- Regarding: Medicine Johnathan Heskett- this patient called on Friday and said you called in medicine for her H-Pilori on 02/05/16. She went to pick it up today and her cost is over 200.00.  Please call patient and let her know if something else can be called in. Also asked patient to call around to different pharmacies at this time and if she found it to be cheaper she could have the prescription transferred.

## 2016-02-11 ENCOUNTER — Ambulatory Visit (INDEPENDENT_AMBULATORY_CARE_PROVIDER_SITE_OTHER): Payer: Commercial Managed Care - HMO | Admitting: Family Medicine

## 2016-02-11 ENCOUNTER — Encounter: Payer: Self-pay | Admitting: Family Medicine

## 2016-02-11 VITALS — BP 134/62 | HR 75 | Temp 98.2°F | Wt 139.0 lb

## 2016-02-11 DIAGNOSIS — M2042 Other hammer toe(s) (acquired), left foot: Secondary | ICD-10-CM | POA: Diagnosis not present

## 2016-02-11 DIAGNOSIS — M79672 Pain in left foot: Secondary | ICD-10-CM | POA: Diagnosis not present

## 2016-02-11 DIAGNOSIS — L259 Unspecified contact dermatitis, unspecified cause: Secondary | ICD-10-CM | POA: Diagnosis not present

## 2016-02-11 DIAGNOSIS — G8929 Other chronic pain: Secondary | ICD-10-CM | POA: Diagnosis not present

## 2016-02-11 DIAGNOSIS — M9272 Juvenile osteochondrosis of metatarsus, left foot: Secondary | ICD-10-CM | POA: Diagnosis not present

## 2016-02-11 MED ORDER — PREDNISONE 10 MG PO TABS: ORAL_TABLET | ORAL | 0 refills | Status: DC

## 2016-02-11 NOTE — Telephone Encounter (Signed)
Contacted pt and advised her I would need to speak with Dr. Allen Norris about a medication change for her positive H pylori. Will contact her back Monday.

## 2016-02-11 NOTE — Telephone Encounter (Signed)
-----   Message from Celene Kras, [place] sent at 02/07/2016  9:52 AM EDT ----- Regarding: Medicine Joanna Rodriguez- this patient called on Friday and said you called in medicine for her H-Pilori on 02/05/16. She went to pick it up today and her cost is over 200.00.  Please call patient and let her know if something else can be called in. Also asked patient to call around to different pharmacies at this time and if she found it to be cheaper she could have the prescription transferred.

## 2016-02-11 NOTE — Progress Notes (Signed)
BP 134/62   Pulse 75   Temp 98.2 F (36.8 C)   Wt 139 lb (63 kg)   SpO2 98%   BMI 29.05 kg/m    Subjective:    Patient ID: Joanna Rodriguez, female    DOB: 02/02/1951, 65 y.o.   MRN: [id number]  HPI: Joanna Rodriguez is a 65 y.o. female  Chief Complaint  Patient presents with  . Allergic Reaction    Patient recieved both her pneumonia and flu vaccine over the weekend in the same arm,  she had an reaction to one of these in her left arm, unsure of which one    Allergic reaction- had her flue and pneumonia shots on Saturday and then started with rash on the L arm and pain. Better now, but was very uncomfortable and itchy Duration: 3 days Location: medial L arm History of trauma in area: no Pain: no Severity: moderateH3 Redness: yes Swelling: no Oozing: no Pus: no Fevers: no Nausea/vomiting: no Status: better Treatments attempted:none  Tetanus: UTD  Relevant past medical, surgical, family and social history reviewed and updated as indicated. Interim medical history since our last visit reviewed. Allergies and medications reviewed and updated.  Review of Systems  Constitutional: Negative.   Respiratory: Negative.   Cardiovascular: Negative.   Skin: Positive for rash. Negative for color change, pallor and wound.  Psychiatric/Behavioral: Negative.     Per HPI unless specifically indicated above     Objective:    BP 134/62   Pulse 75   Temp 98.2 F (36.8 C)   Wt 139 lb (63 kg)   SpO2 98%   BMI 29.05 kg/m   Wt Readings from Last 3 Encounters:  02/11/16 139 lb (63 kg)  02/07/16 140 lb (63.5 kg)  11/21/15 140 lb (63.5 kg)    Physical Exam  Constitutional: She is oriented to person, place, and time. She appears well-developed and well-nourished. No distress.  HENT:  Head: Normocephalic and atraumatic.  Right Ear: Hearing normal.  Left Ear: Hearing normal.  Nose: Nose normal.  Eyes: Conjunctivae and lids are normal. Right eye exhibits no  discharge. Left eye exhibits no discharge. No scleral icterus.  Cardiovascular: Normal rate, regular rhythm, normal heart sounds and intact distal pulses.  Exam reveals no gallop and no friction rub.   No murmur heard. Pulmonary/Chest: Effort normal and breath sounds normal. No respiratory distress. She has no wheezes. She has no rales. She exhibits no tenderness.  Musculoskeletal: Normal range of motion.  Neurological: She is alert and oriented to person, place, and time.  Skin: Skin is warm, dry and intact. Rash (lacey erthematous rash on medial side of L arm) noted. She is not diaphoretic. No erythema. No pallor.  Psychiatric: She has a normal mood and affect. Her speech is normal and behavior is normal. Judgment and thought content normal. Cognition and memory are normal.  Nursing note and vitals reviewed.   Results for orders placed or performed in visit on 01/28/16  H. pylori antigen, stool  Result Value Ref Range   H pylori Ag, Stl Positive (A) Negative      Assessment & Plan:   Problem List Items Addressed This Visit    None    Visit Diagnoses    Contact dermatitis    -  Primary   Will treat with prednisone and benadryl at night. Call with concerns or not getting better. Continue to monitor.        Follow up plan: Return As  scheduled.

## 2016-02-17 ENCOUNTER — Telehealth: Payer: Self-pay | Admitting: Internal Medicine

## 2016-02-17 NOTE — Telephone Encounter (Signed)
LMOVM for pt to call back to see if she needs a sample and to instruct on pt assistance forms for Spiriva. Will await call back.

## 2016-02-17 NOTE — Telephone Encounter (Signed)
Pt calling stating she is in donut hole on Spiriva  Would like to just let us know she will call us back when she gets low on medication For she can no longer afford to do 90 day refills on medication.  She will be using her local pharmacy as well

## 2016-02-18 NOTE — Telephone Encounter (Signed)
Spoke with pt and she states she has enough Spiriva to get her through Sept. Informed pt to fill out Pt asst forms for drug company and to let us know if she needs anything further from [place].

## 2016-02-24 ENCOUNTER — Telehealth: Payer: Self-pay | Admitting: Family Medicine

## 2016-02-24 NOTE — Telephone Encounter (Signed)
I'm not sure- can you ask her what they were so we can give her the right things?

## 2016-02-24 NOTE — Telephone Encounter (Signed)
Dr.Johnson, do you know what exercises were given to her?

## 2016-02-24 NOTE — Telephone Encounter (Signed)
Pt would like to request another copy of her nightly exercises. She has wasted something on it and is unable to see it.

## 2016-02-24 NOTE — Telephone Encounter (Signed)
Positional vertigo, she would do them on the bed before she goes to bed

## 2016-02-24 NOTE — Telephone Encounter (Signed)
Papers printed out and up front for her

## 2016-02-25 ENCOUNTER — Telehealth: Payer: Self-pay

## 2016-02-25 NOTE — Telephone Encounter (Signed)
Pt was previously treated for H pylori. Repeat stool test showed her to still positive. When I sent her failure treatment in the Tetracycline was too expensive. She is in the donut hole with her insurance and cannot afford this medication. Please discuss this with me later today.

## 2016-02-26 ENCOUNTER — Other Ambulatory Visit: Payer: Self-pay

## 2016-02-26 MED ORDER — BIS SUBCIT-METRONID-TETRACYC 140-125-125 MG PO CAPS: 3.0000 | ORAL_CAPSULE | Freq: Three times a day (TID) | ORAL | 0 refills | Status: DC

## 2016-02-26 NOTE — Telephone Encounter (Signed)
Pt will be given 14 days of Pylera samples due to medication being too expensive and pt is currently in her donut hole. Pt will pick up samples tomorrow morning.

## 2016-03-03 ENCOUNTER — Encounter: Payer: Self-pay | Admitting: Family Medicine

## 2016-03-03 ENCOUNTER — Ambulatory Visit (INDEPENDENT_AMBULATORY_CARE_PROVIDER_SITE_OTHER): Payer: Commercial Managed Care - HMO | Admitting: Family Medicine

## 2016-03-03 ENCOUNTER — Telehealth: Payer: Self-pay | Admitting: Internal Medicine

## 2016-03-03 VITALS — BP 119/76 | HR 66 | Temp 98.9°F | Ht 60.1 in | Wt 139.0 lb

## 2016-03-03 DIAGNOSIS — Z01818 Encounter for other preprocedural examination: Secondary | ICD-10-CM | POA: Diagnosis not present

## 2016-03-03 NOTE — Telephone Encounter (Signed)
Per Dr. Elvina Mattes, patient needs cardiac clearance for podiatric surgery. Patient's PCP, Dr. Wynetta Emery, told the patient that Dr. Stevenson Clinch should provide this clearance.  Please fax clearance documentation to Dr. Selina Cooley office/[facility].  Contact the patient at [phone] if you have any questions.

## 2016-03-03 NOTE — Progress Notes (Signed)
BP 119/76 (BP Location: Left Arm, Patient Position: Sitting, Cuff Size: Normal)   Pulse 66   Temp 98.9 F (37.2 C)   Ht 5' 0.1" (1.527 m)   Wt 139 lb (63 kg)   SpO2 99%   BMI 27.06 kg/m    Subjective:    Patient ID: [name], female    DOB: March 28, 1951, 65 y.o.   MRN: [id number]  HPI: Kayah Zonnie Landen is a 65 y.o. female  Chief Complaint  Patient presents with  . surgical clearance   To have hammer toe correction and have metatarsal osteomy done on 03/19/16 with her podiatrist, Dr. Elvina Mattes. Has had multiple surgeries before. Has never had problems with anesthesia before. Has never had problems with extubation before. Does desat with elevated heart rate, saw him last week and is going to call him for surgical clearance. Feeling well today. No concerns with fevers, chills, SOB, or any other concerns today.  Active Ambulatory Problems    Diagnosis Date Noted  . COPD (chronic obstructive pulmonary disease) ([place]) 09/06/2014  . Dyspnea 09/06/2014  . Spontaneous pneumothorax 09/17/2014  . B12 deficiency 03/06/2015  . CC (Crohn's colitis) ([place]) 03/06/2015  . Genital herpes 03/06/2015  . HTN (hypertension) 05/20/2015  . Thyroid activity decreased 05/20/2015  . Osteopenia 07/04/2015  . Stricture and stenosis of esophagus   . Trouble swallowing   . Reflux esophagitis   . Gastritis    Resolved Ambulatory Problems    Diagnosis Date Noted  . Tobacco abuse counseling 09/17/2014  . Disease of thyroid gland 03/06/2015  . Personal history of tobacco use, presenting hazards to health 10/23/2015   Past Medical History:  Diagnosis Date  . Arthritis   . Cancer ([place])   . COPD (chronic obstructive pulmonary disease) ([place])   . Crohn's disease ([place])   . Genital herpes   . Hypertension   . Hypothyroid   . Kidney stones   . Osteopenia   . Personal history of tobacco use, presenting hazards to health 10/23/2015  . Scoliosis   . Spontaneous pneumothorax   . Vertigo   .  Volvulus ([facility])    Allergies  Allergen Reactions  . Nsaids     Other reaction(s): Other (See Comments) Caused Crohn's flare up.   Past Surgical History:  Procedure Laterality Date  . ABDOMINAL HYSTERECTOMY    . APPENDECTOMY    . BREAST EXCISIONAL BIOPSY Bilateral    -  both in 1990's  . ESOPHAGOGASTRODUODENOSCOPY (EGD) WITH PROPOFOL N/A 11/21/2015   Procedure: ESOPHAGOGASTRODUODENOSCOPY (EGD) WITH PROPOFOL with dialation;  Surgeon: Lucilla Lame, MD;  Location: [place];  Service: Endoscopy;  Laterality: N/A;  . TALC PLEURODESIS  07/2014   left side  . THORACOSCOPY  07/2014   left side  . TUBAL LIGATION    . VOLVULUS REDUCTION     Family History  Problem Relation Age of Onset  . Hypertension Mother   . Hypothyroidism Mother   . Alcohol abuse Mother   . Breast cancer Mother 28  . Hyperlipidemia Mother   . Mental illness Mother   . Heart attack Father   . Heart disease Father   . Glaucoma Father   . Colon cancer Father 60  . Diabetes Father   . Hypertension Father   . Heart disease Sister   . Hyperlipidemia Sister   . Hypertension Sister   . Lung disease Sister    Social History   Social History  . Marital status: Divorced  Spouse name: N/A  . Number of children: N/A  . Years of education: N/A   Occupational History  . 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 Smoking status: Former Smoker    Packs/day: 1.00    Years: 53.00    Types: Cigarettes    Quit date: 06/29/2014  . Smokeless tobacco: Never Used     Comment: NOV 16,2015  . Alcohol use 0.0 oz/week     Comment: occasional - 2-3x/yr  . Drug use: No  . Sexual activity: Not on file   Other Topics Concern  . Not on file   Social History Narrative  . No narrative on file   Review of Systems  Constitutional: Negative.   HENT: Negative.   Respiratory: Negative.   Cardiovascular: Negative.   Gastrointestinal: Negative.   Endocrine: Negative.     Genitourinary: Negative.   Musculoskeletal: Negative.   Allergic/Immunologic: Negative.   Neurological: Negative.   Hematological: Negative.   Psychiatric/Behavioral: Negative.     Per HPI unless specifically indicated above     Objective:    BP 119/76 (BP Location: Left Arm, Patient Position: Sitting, Cuff Size: Normal)   Pulse 66   Temp 98.9 F (37.2 C)   Ht 5' 0.1" (1.527 m)   Wt 139 lb (63 kg)   SpO2 99%   BMI 27.06 kg/m   Wt Readings from Last 3 Encounters:  03/03/16 139 lb (63 kg)  02/11/16 139 lb (63 kg)  02/07/16 140 lb (63.5 kg)    Physical Exam  Constitutional: She is oriented to person, place, and time. She appears well-developed and well-nourished. No distress.  HENT:  Head: Normocephalic and atraumatic.  Right Ear: Hearing and external ear normal.  Left Ear: Hearing and external ear normal.  Nose: Nose normal.  Mouth/Throat: Oropharynx is clear and moist. No oropharyngeal exudate.  Eyes: Conjunctivae, EOM and lids are normal. Pupils are equal, round, and reactive to light. Right eye exhibits no discharge. Left eye exhibits no discharge. No scleral icterus.  Neck: Normal range of motion. Neck supple. No JVD present. No tracheal deviation present. No thyromegaly present.  Cardiovascular: Normal rate, regular rhythm, normal heart sounds and intact distal pulses.  Exam reveals no gallop and no friction rub.   No murmur heard. Pulmonary/Chest: Effort normal and breath sounds normal. No stridor. No respiratory distress. She has no wheezes. She has no rales. She exhibits no tenderness.  Abdominal: Soft. Bowel sounds are normal. She exhibits no distension and no mass. There is no tenderness. There is no rebound and no guarding.  Musculoskeletal: Normal range of motion. She exhibits no edema, tenderness or deformity.  Lymphadenopathy:    She has no cervical adenopathy.  Neurological: She is alert and oriented to person, place, and time. She has normal reflexes. She  displays normal reflexes. No cranial nerve deficit. She exhibits normal muscle tone. Coordination normal.  Skin: Skin is warm, dry and intact. No rash noted. She is not diaphoretic. No erythema. No pallor.  Psychiatric: She has a normal mood and affect. Her speech is normal and behavior is normal. Judgment and thought content normal. Cognition and memory are normal.  Nursing note and vitals reviewed.   Results for orders placed or performed in visit on 01/28/16  H. pylori antigen, stool  Result Value Ref Range   H pylori Ag, Stl Positive (A) Negative      Assessment & Plan:   Problem List  Items Addressed This Visit    None    Visit Diagnoses    Preop exam for internal medicine    -  Primary   EKG normal. Checking CBC, CMP and PTT today, Approved for sugery pending pulmonology approval and lab review.    Relevant Orders   Comprehensive metabolic panel   CBC with Differential/Platelet   PTT   EKG 12-Lead (Completed)       Follow up plan: Return As scheduled.

## 2016-03-03 NOTE — Telephone Encounter (Signed)
Spoke with pt who states she seen her PCP today and a EKG was performed. Pt states her PCP is wanting pulmonary clearance for podiatric surgery due to her collapsed lung.  VM please advise. Thanks.

## 2016-03-04 LAB — CBC WITH DIFFERENTIAL/PLATELET
BASOS: 1 %
Basophils Absolute: 0 10*3/uL (ref 0.0–0.2)
EOS (ABSOLUTE): 0 10*3/uL (ref 0.0–0.4)
EOS: 0 %
HEMATOCRIT: 37.7 % (ref 34.0–46.6)
HEMOGLOBIN: 12.9 g/dL (ref 11.1–15.9)
Immature Grans (Abs): 0 10*3/uL (ref 0.0–0.1)
Immature Granulocytes: 0 %
LYMPHS ABS: 1.7 10*3/uL (ref 0.7–3.1)
Lymphs: 38 %
MCH: 34.1 pg — ABNORMAL HIGH (ref 26.6–33.0)
MCHC: 34.2 g/dL (ref 31.5–35.7)
MCV: 100 fL — AB (ref 79–97)
MONOS ABS: 0.3 10*3/uL (ref 0.1–0.9)
Monocytes: 8 %
NEUTROS ABS: 2.4 10*3/uL (ref 1.4–7.0)
Neutrophils: 53 %
Platelets: 396 10*3/uL — ABNORMAL HIGH (ref 150–379)
RBC: 3.78 x10E6/uL (ref 3.77–5.28)
RDW: 15 % (ref 12.3–15.4)
WBC: 4.5 10*3/uL (ref 3.4–10.8)

## 2016-03-04 LAB — COMPREHENSIVE METABOLIC PANEL
ALBUMIN: 3.9 g/dL (ref 3.6–4.8)
ALK PHOS: 50 IU/L (ref 39–117)
ALT: 95 IU/L — ABNORMAL HIGH (ref 0–32)
AST: 53 IU/L — ABNORMAL HIGH (ref 0–40)
Albumin/Globulin Ratio: 1.7 (ref 1.2–2.2)
BUN / CREAT RATIO: 24 (ref 12–28)
BUN: 18 mg/dL (ref 8–27)
Bilirubin Total: 0.3 mg/dL (ref 0.0–1.2)
CO2: 25 mmol/L (ref 18–29)
CREATININE: 0.76 mg/dL (ref 0.57–1.00)
Calcium: 9.4 mg/dL (ref 8.7–10.3)
Chloride: 102 mmol/L (ref 96–106)
GFR, EST AFRICAN AMERICAN: 94 mL/min/{1.73_m2} (ref >59)
GFR, EST NON AFRICAN AMERICAN: 81 mL/min/{1.73_m2} (ref >59)
GLOBULIN, TOTAL: 2.3 g/dL (ref 1.5–4.5)
Glucose: 98 mg/dL (ref 65–99)
Potassium: 5.1 mmol/L (ref 3.5–5.2)
SODIUM: 143 mmol/L (ref 134–144)
TOTAL PROTEIN: 6.2 g/dL (ref 6.0–8.5)

## 2016-03-04 LAB — APTT: aPTT: 28 s (ref 24–33)

## 2016-03-04 NOTE — Telephone Encounter (Signed)
Spoke with pt and she states she will have gen anesthesia and that her PCP requested the pulm clearance. appt scheduled. Nothing further needed.

## 2016-03-04 NOTE — Telephone Encounter (Signed)
Patient is advised to continue to use inhalers regularly. No smoking or exposure to second hand smoking prior to surgery. Continue with daily walking.   If surgical procedure requires general anesthesia AND the surgery is intra-abdominal/cardiac/pulmonary, then a  Pulmonary clearance is needed.   Surgical procedures below the diaphragm (knee/hip replacement, podiatry, etc), do not necessitate a pulmonary clearance; unless they have complicated pulmonary issues and require general anesthesia.    Dr. Stevenson Clinch

## 2016-03-05 ENCOUNTER — Encounter: Payer: Self-pay | Admitting: Internal Medicine

## 2016-03-05 ENCOUNTER — Ambulatory Visit (INDEPENDENT_AMBULATORY_CARE_PROVIDER_SITE_OTHER): Payer: Commercial Managed Care - HMO | Admitting: Internal Medicine

## 2016-03-05 VITALS — BP 118/78 | HR 91 | Ht 63.0 in | Wt 139.0 lb

## 2016-03-05 DIAGNOSIS — J432 Centrilobular emphysema: Secondary | ICD-10-CM | POA: Diagnosis not present

## 2016-03-05 DIAGNOSIS — Z01811 Encounter for preprocedural respiratory examination: Secondary | ICD-10-CM | POA: Diagnosis not present

## 2016-03-05 NOTE — Patient Instructions (Signed)
Follow up with Dr. Stevenson Clinch in:2 months - cont with current inhalers - cont with exercise as tolerated - cont with incentive spirometry after surgery

## 2016-03-05 NOTE — Assessment & Plan Note (Signed)
Clinically patient with  COPD, and with obstruction noted on her pulmonary function testing. Patient was educated on pneumothorax and COPD, she was noted to have 1 bullae during her last hospitalization, but with severe emphysema noted on a CAT scan, and she is at increased risk for developing more bullae in the future (especially if she resumes smoking).  She states she's back to baseline breathing currently, walking about 10-15 miles per day, and has stopped smoking completely. Was having moderate to severe reflux in the evenings with Advair and Qvar, has stopped all ICS.  Decided not to pursue Arnuity due to cost.  Currently on Spiriva, with good symptom control, but very costly  Plan: - cont with current dose Spiriva, - Continue to  avoid tobacco. - cont with diet and exercise, daily walking.

## 2016-03-05 NOTE — Progress Notes (Signed)
MRN# [id number] Joanna Rodriguez Mar 22, 1951   CC: Chief Complaint  Patient presents with  . Follow-up    surgical clearance; NP cough occasionally; SOB one day due to a stell smoke smell in home that day      Brief History: 09/03/14 This is a pleasant 65 year old female referred by Dr. Durwin Nora for COPD evaluation and workup along with episodes of 2 spontaneous pneumothorax in the last 2 months. Below is her smoking and history of COPD: - 1 episode of bronchitis per year, not every year - No intubations prior to recent hospitalization - No childhood asthma - Pet dog at home - Quit smoking Nov 2015 - No seasonal allergies - Left chest wall tenderness - Still with DOE - can walk to mailbox, or about 3 car lenghts  Patient stated that in Nov 2015 she had a bad "bronchitis" for which he continued to coughing spells and sob, in Jan 2015 had significant SOB, CXR showed L pneumothorax. Had chest tube placed at time, then in Feb 2015, had a another spontaneous pneumothorax which required chest tube placed along with talc pleurodesis.  During per talc pleurodesis procedure Dr. Genevive Bi noted a single bleb on the left side.  Currently patient states that she is doing well, still with shortness of breath and cough, but overall with improvement.  She is currently not smoking  PLAN - stop tobacco, pfts, 60mt   Events since last clinic visit: Patient presents today for a preop pulmonary clearance visit. She is scheduled to have podiatry surgery on her right foot for a possible pin revision and fluid removal from her metatarsals. She endorses right foot pain. She [facility]. She is using her inhalers as prescribed. She does have a portable pulse oximetry device and regularly checks her O2 levels which is consistently above 90%. Since her last visit she has tested positive for H. pylori, she is currently on triple antibiotic  therapy which she will finish shortly.  Medication:    Current Outpatient Prescriptions:  .  acyclovir (ZOVIRAX) 400 MG tablet, Take 1 tablet (400 mg total) by mouth daily. (Patient taking differently: Take 400 mg by mouth daily. Takes as needed), Disp: 90 tablet, Rfl: 3 .  Alum & Mag Hydroxide-Simeth (ANTACID ANTI-GAS MAX STRENGTH PO), Take 1 each by mouth daily. Reported on 11/21/2015, Disp: , Rfl:  .  aspirin EC 81 MG tablet, Take 81 mg by mouth daily., Disp: , Rfl:  .  B Complex-Folic Acid (B COMPLEX-VITAMIN B12 PO), Take 1 tablet by mouth daily., Disp: , Rfl:  .  bismuth-metronidazole-tetracycline (PYLERA) 140-125-125 MG capsule, Take 3 capsules by mouth 4 (four) times daily -  before meals and at bedtime. X 14 days (samples given), Disp: 136 capsule, Rfl: 0 .  BLACK COHOSH PO, Take by mouth daily., Disp: , Rfl:  .  Calcium Carb-Cholecalciferol 600-800 MG-UNIT TABS, Take by mouth 2 (two) times daily., Disp: , Rfl:  .  Co-Enzyme Q-10 30 MG CAPS, Take 1 capsule by mouth daily., Disp: , Rfl:  .  Glucosamine-Chondroit-Vit C-Mn (SM GLUCOSAMINE/CHONDROITIN PO), Take 1,500 mg by mouth 2 (two) times daily., Disp: , Rfl:  .  levothyroxine (SYNTHROID, LEVOTHROID) 100 MCG tablet, TAKE 1 TABLET BY MOUTH DAILY , Disp: 90 tablet, Rfl: 3 .  lisinopril (PRINIVIL,ZESTRIL) 5 MG tablet, Take 1 tablet (5 mg total) by mouth daily., Disp: 90 tablet, Rfl: 1 .  Melatonin 5 MG TABS, Take 5  mg by mouth at bedtime as needed., Disp: , Rfl:  .  mercaptopurine (PURINETHOL) 50 MG tablet, TAKE 1 AND 1/2 TABLETS EVERY DAY, Disp: 135 tablet, Rfl: 3 .  Multiple Vitamins-Minerals (MULTIVITAMIN WITH MINERALS) tablet, Take 1 tablet by mouth daily., Disp: , Rfl:  .  Omega-3 Fatty Acids (FISH OIL PO), Take 1,200 mg by mouth 2 (two) times daily., Disp: , Rfl:  .  pantoprazole (PROTONIX) 40 MG tablet, Take 1 tablet (40 mg total) by mouth daily., Disp: 90 tablet, Rfl: 3 .  Tiotropium Bromide Monohydrate (SPIRIVA RESPIMAT) 2.5 MCG/ACT  AERS, Inhale 2 puffs into the lungs daily. Gargle and rinse after each use, Disp: 3 Inhaler, Rfl: 1 .  traMADol (ULTRAM) 50 MG tablet, TAKE 1 TABLET THREE TIMES DAILY, Disp: 270 tablet, Rfl: 1    Review of Systems: Gen:  Denies  fever, sweats, chills HEENT: Denies blurred vision, double vision, ear pain, eye pain, hearing loss, nose bleeds, sore throat Cvc:  No dizziness, chest pain or heaviness Resp:   Admits to: Mild dry cough (chornic - improving).  Gi: Denies swallowing difficulty, stomach pain, nausea or vomiting, diarrhea, constipation, bowel incontinence Gu:  Denies bladder incontinence, burning urine Ext:   Right foot pain Skin: No skin rash, easy bruising or bleeding or hives Endoc:  No polyuria, polydipsia , polyphagia or weight change Other:  All other systems negative  Allergies:  Nsaids  Physical Examination:  VS: BP 118/78 (BP Location: Left Arm, Cuff Size: Normal)   Pulse 91   Ht 5' 3"  (1.6 m)   Wt 139 lb (63 kg)   SpO2 97%   BMI 24.62 kg/m   General Appearance: No distress  HEENT: PERRLA, no ptosis, no other lesions noticed Pulmonary:normal breath sounds., diaphragmatic excursion normal.No wheezing, No rales   Cardiovascular:  Normal S1,S2.  No m/r/g.     Abdomen:Exam: Benign, Soft, non-tender, No masses  Skin:   warm, no rashes, no ecchymosis  Extremities: normal, no cyanosis, clubbing, warm with normal capillary refill.     Assessment and Plan: 65 year old female past medical history of moderate COPD, pulmonary bullae, pneumothorax, status post thorascopy presented for follow-up visit Preop pulmonary/respiratory exam Preoperative Pulmonary Risk Assessment Patient has a 1.8% risk for postop respiratory failure. ([place] AM, Angela Adam, [place] WG, Khuri SF. Multifactorial risk index for predicting postoperative respiratory failure in men after major noncardiac surgery. The [facility] [facility] 2000;  [phone].)  Respiratory failure define as failure to wean from mechanical ventilation within 48 hours of surgery or unplanned intubation/reintubation postoperatively   Patient is proposed low risk, 1.6%, for postop pulmonary complications. (From [employer], et al. Anesthesiology 2010; 165:5374) Pulmonary complications such as post-op resp. failure, plus PNA, effusion, atelectasis, bronchospasm.  General Risk Reduction Strategies: - All patients warrant post-operative incentive spirometry. For those with obstruction, also consider flutter valve. - Early ambulation, PT/OT - DVT prophylaxis where appropriate - Adequate pain control without oversedation - tobacco cessation or reduction at least 4-6 weeks prior to surgery.     Updated Medication List Outpatient Encounter Prescriptions as of 03/05/2016  Medication Sig  . acyclovir (ZOVIRAX) 400 MG tablet Take 1 tablet (400 mg total) by mouth daily. (Patient taking differently: Take 400 mg by mouth daily. Takes as needed)  . Alum & Mag Hydroxide-Simeth (ANTACID ANTI-GAS MAX STRENGTH PO) Take 1 each by mouth daily. Reported on 11/21/2015  . aspirin EC 81 MG tablet Take 81 mg by  mouth daily.  . B Complex-Folic Acid (B COMPLEX-VITAMIN B12 PO) Take 1 tablet by mouth daily.  Marland Kitchen bismuth-metronidazole-tetracycline (PYLERA) 140-125-125 MG capsule Take 3 capsules by mouth 4 (four) times daily -  before meals and at bedtime. X 14 days (samples given)  . BLACK COHOSH PO Take by mouth daily.  . Calcium Carb-Cholecalciferol 600-800 MG-UNIT TABS Take by mouth 2 (two) times daily.  Marland Kitchen Co-Enzyme Q-10 30 MG CAPS Take 1 capsule by mouth daily.  . Glucosamine-Chondroit-Vit C-Mn (SM GLUCOSAMINE/CHONDROITIN PO) Take 1,500 mg by mouth 2 (two) times daily.  Marland Kitchen levothyroxine (SYNTHROID, LEVOTHROID) 100 MCG tablet TAKE 1 TABLET BY MOUTH DAILY   . lisinopril (PRINIVIL,ZESTRIL) 5 MG tablet Take 1 tablet (5 mg total) by mouth daily.  . Melatonin 5 MG TABS Take  5 mg by mouth at bedtime as needed.  . mercaptopurine (PURINETHOL) 50 MG tablet TAKE 1 AND 1/2 TABLETS EVERY DAY  . Multiple Vitamins-Minerals (MULTIVITAMIN WITH MINERALS) tablet Take 1 tablet by mouth daily.  . Omega-3 Fatty Acids (FISH OIL PO) Take 1,200 mg by mouth 2 (two) times daily.  . pantoprazole (PROTONIX) 40 MG tablet Take 1 tablet (40 mg total) by mouth daily.  . Tiotropium Bromide Monohydrate (SPIRIVA RESPIMAT) 2.5 MCG/ACT AERS Inhale 2 puffs into the lungs daily. Gargle and rinse after each use  . traMADol (ULTRAM) 50 MG tablet TAKE 1 TABLET THREE TIMES DAILY   No facility-administered encounter medications on file as of 03/05/2016.     Orders for this visit: No orders of the defined types were placed in this encounter.   Thank  you for the visitation and for allowing  [facility] to assist in the care of your patient. Our recommendations are noted above.  Please contact us if we can be of further service.  Vilinda Boehringer, MD [facility] Office Number: [phone]

## 2016-03-05 NOTE — Assessment & Plan Note (Signed)
Preoperative Pulmonary Risk Assessment Patient has a 1.8% risk for postop respiratory failure. ([place] AM, Angela Adam, Henderson WG, Khuri SF. Multifactorial risk index for predicting postoperative respiratory failure in men after major noncardiac surgery. [facility] 2000; [phone].)  Respiratory failure define as failure to wean from mechanical ventilation within 48 hours of surgery or unplanned intubation/reintubation postoperatively   Patient is proposed low risk, 1.6%, for postop pulmonary complications. (From [employer], et al. Anesthesiology 2010; 164:2903) Pulmonary complications such as post-op resp. failure, plus PNA, effusion, atelectasis, bronchospasm.  General Risk Reduction Strategies: - All patients warrant post-operative incentive spirometry. For those with obstruction, also consider flutter valve. - Early ambulation, PT/OT - DVT prophylaxis where appropriate - Adequate pain control without oversedation - tobacco cessation or reduction at least 4-6 weeks prior to surgery.

## 2016-03-10 DIAGNOSIS — G8929 Other chronic pain: Secondary | ICD-10-CM | POA: Diagnosis not present

## 2016-03-10 DIAGNOSIS — M9272 Juvenile osteochondrosis of metatarsus, left foot: Secondary | ICD-10-CM | POA: Diagnosis not present

## 2016-03-10 DIAGNOSIS — M79672 Pain in left foot: Secondary | ICD-10-CM | POA: Diagnosis not present

## 2016-03-10 DIAGNOSIS — M2042 Other hammer toe(s) (acquired), left foot: Secondary | ICD-10-CM | POA: Diagnosis not present

## 2016-03-16 NOTE — Discharge Instructions (Signed)
[place] [address] [place]   1. Take your medication as prescribed.  Pain medication should be taken only as needed.  2. Keep the dressing clean, dry and intact.  3. Keep your foot elevated above the heart level for the first 48 hours.  4. Walking to the bathroom and brief periods of walking are acceptable, unless we have instructed you to be non-weight bearing.  5. Always wear your post-op shoe when walking.  Always use your crutches if you are to be non-weight bearing.  6. Do not take a shower. Baths are permissible as long as the foot is kept out of the water.   7. Every hour you are awake:  - Bend your knee 15 times. - Flex foot 15 times - Massage calf 15 times  8. Call [facility] [phone]) if any of the following problems occur: - You develop a temperature or fever. - The bandage becomes saturated with blood. - Medication does not stop your pain. - Injury of the foot occurs. - Any symptoms of infection including redness, odor, or red streaks running from wound.  General Anesthesia, Adult, Care After Refer to this sheet in the next few weeks. These instructions provide you with information on caring for yourself after your procedure. Your health care provider may also give you more specific instructions. Your treatment has been planned according to current medical practices, but problems sometimes occur. Call your health care provider if you have any problems or questions after your procedure. WHAT TO EXPECT AFTER THE PROCEDURE After the procedure, it is typical to experience:  Sleepiness.  Nausea and vomiting. HOME CARE INSTRUCTIONS  For the first 24 hours after general anesthesia:  Have a responsible person with you.  Do not drive a car. If you are alone, do not take public transportation.  Do not drink alcohol.  Do not take  medicine that has not been prescribed by your health care provider.  Do not sign important papers or make important decisions.  You may resume a normal diet and activities as directed by your health care provider.  Change bandages (dressings) as directed.  If you have questions or problems that seem related to general anesthesia, call the hospital and ask for the anesthetist or anesthesiologist on call. SEEK MEDICAL CARE IF:  You have nausea and vomiting that continue the day after anesthesia.  You develop a rash. SEEK IMMEDIATE MEDICAL CARE IF:   You have difficulty breathing.  You have chest pain.  You have any allergic problems.   This information is not intended to replace advice given to you by your health care provider. Make sure you discuss any questions you have with your health care provider.   Document Released: 09/21/2000 Document Revised: 07/06/2014 Document Reviewed: 10/14/2011 Elsevier Interactive Patient Education [employer].

## 2016-03-18 NOTE — Anesthesia Preprocedure Evaluation (Addendum)
Anesthesia Evaluation  Patient identified by MRN, date of birth, ID band Patient awake    Reviewed: Allergy & Precautions, NPO status , Patient's Chart, lab work & pertinent test results  History of Anesthesia Complications Negative for: history of anesthetic complications  Airway Mallampati: I  TM Distance: >3 FB Neck ROM: Full    Dental  (+) Chipped,    Pulmonary neg pulmonary ROS, COPD (moderate),  COPD inhaler, former smoker,  Spont. PTX 2015;   Pulmonary exam normal        Cardiovascular Exercise Tolerance: Good hypertension, Normal cardiovascular exam  ekg: nsr;  stress: 06/2015: normal;  pulm cleared: 02/2016: dr. Stevenson Clinch; med cleared: 02/2016: dr. Wynetta Emery; cards stable; 06/2015: dr. Fletcher Anon;   Neuro/Psych negative neurological ROS  negative psych ROS   GI/Hepatic Neg liver ROS, GERD  Controlled,Crohn's Disease; VOLVULUS REDUCTION 1988;   Endo/Other  Hypothyroidism   Renal/GU negative Renal ROS  negative genitourinary   Musculoskeletal  (+) Arthritis , Osteoarthritis,  Mild scoliosis;   Abdominal   Peds  Hematology negative hematology ROS (+)   Anesthesia Other Findings Surgeon requests LMA with local.  Reproductive/Obstetrics                           Anesthesia Physical Anesthesia Plan  ASA: III  Anesthesia Plan:    Post-op Pain Management:    Induction:   Airway Management Planned:   Additional Equipment:   Intra-op Plan:   Post-operative Plan:   Informed Consent: I have reviewed the patients History and Physical, chart, labs and discussed the procedure including the risks, benefits and alternatives for the proposed anesthesia with the patient or authorized representative who has indicated his/her understanding and acceptance.     Plan Discussed with: CRNA  Anesthesia Plan Comments:         Anesthesia Quick Evaluation

## 2016-03-19 ENCOUNTER — Encounter
Admission: RE | Disposition: A | Discharge status: Home / Self Care | Payer: Self-pay | Source: Ambulatory Visit | Attending: Podiatry

## 2016-03-19 ENCOUNTER — Ambulatory Visit: Payer: Commercial Managed Care - HMO | Admitting: Anesthesiology

## 2016-03-19 ENCOUNTER — Ambulatory Visit
Admission: RE | Admit: 2016-03-19 | Discharge: 2016-03-19 | Disposition: A | Discharge status: Home / Self Care | Payer: Commercial Managed Care - HMO | Source: Ambulatory Visit | Attending: Podiatry | Admitting: Podiatry

## 2016-03-19 DIAGNOSIS — M2042 Other hammer toe(s) (acquired), left foot: Secondary | ICD-10-CM | POA: Insufficient documentation

## 2016-03-19 DIAGNOSIS — M9272 Juvenile osteochondrosis of metatarsus, left foot: Secondary | ICD-10-CM

## 2016-03-19 DIAGNOSIS — I10 Essential (primary) hypertension: Secondary | ICD-10-CM | POA: Diagnosis not present

## 2016-03-19 DIAGNOSIS — Z87891 Personal history of nicotine dependence: Secondary | ICD-10-CM | POA: Insufficient documentation

## 2016-03-19 DIAGNOSIS — J449 Chronic obstructive pulmonary disease, unspecified: Secondary | ICD-10-CM | POA: Insufficient documentation

## 2016-03-19 DIAGNOSIS — K5090 Crohn's disease, unspecified, without complications: Secondary | ICD-10-CM | POA: Diagnosis not present

## 2016-03-19 DIAGNOSIS — M79672 Pain in left foot: Secondary | ICD-10-CM | POA: Diagnosis not present

## 2016-03-19 DIAGNOSIS — E039 Hypothyroidism, unspecified: Secondary | ICD-10-CM | POA: Insufficient documentation

## 2016-03-19 DIAGNOSIS — K219 Gastro-esophageal reflux disease without esophagitis: Secondary | ICD-10-CM | POA: Insufficient documentation

## 2016-03-19 DIAGNOSIS — G8929 Other chronic pain: Secondary | ICD-10-CM | POA: Insufficient documentation

## 2016-03-19 DIAGNOSIS — M19072 Primary osteoarthritis, left ankle and foot: Secondary | ICD-10-CM | POA: Diagnosis not present

## 2016-03-19 DIAGNOSIS — M205X2 Other deformities of toe(s) (acquired), left foot: Secondary | ICD-10-CM | POA: Diagnosis not present

## 2016-03-19 HISTORY — PX: FLEXOR TENDON REPAIR: SHX6501

## 2016-03-19 HISTORY — PX: CAPSULOTOMY METATARSOPHALANGEAL: SHX6614

## 2016-03-19 HISTORY — PX: HAMMER TOE SURGERY: SHX385

## 2016-03-19 HISTORY — DX: Dyspnea, unspecified: R06.00

## 2016-03-19 HISTORY — DX: Other forms of dyspnea: R06.09

## 2016-03-19 SURGERY — CORRECTION, HAMMER TOE
Anesthesia: General | Site: Toe | Laterality: Left | Wound class: Clean

## 2016-03-19 MED ORDER — BUPIVACAINE HCL (PF) 0.5 % IJ SOLN
INTRAMUSCULAR | Status: DC | PRN
  Administered 2016-03-19: 8 mL

## 2016-03-19 MED ORDER — DEXAMETHASONE SODIUM PHOSPHATE 4 MG/ML IJ SOLN
INTRAMUSCULAR | Status: DC | PRN
  Administered 2016-03-19: 4 mg via INTRAVENOUS

## 2016-03-19 MED ORDER — ONDANSETRON HCL 4 MG/2ML IJ SOLN
INTRAMUSCULAR | Status: DC | PRN
  Administered 2016-03-19: 4 mg via INTRAVENOUS

## 2016-03-19 MED ORDER — BUPIVACAINE HCL (PF) 0.5 % IJ SOLN
INTRAMUSCULAR | Status: DC | PRN
  Administered 2016-03-19: 5 mL

## 2016-03-19 MED ORDER — OXYCODONE-ACETAMINOPHEN 7.5-325 MG PO TABS: 1.0000 | ORAL_TABLET | ORAL | 0 refills | Status: DC | PRN

## 2016-03-19 MED ORDER — CEFAZOLIN SODIUM-DEXTROSE 2-4 GM/100ML-% IV SOLN
2.0000 g | Freq: Once | INTRAVENOUS | Status: AC
  Administered 2016-03-19: 2 g via INTRAVENOUS

## 2016-03-19 MED ORDER — LACTATED RINGERS IV SOLN
INTRAVENOUS | Status: DC
  Administered 2016-03-19: 07:00:00 via INTRAVENOUS

## 2016-03-19 MED ORDER — MIDAZOLAM HCL 5 MG/5ML IJ SOLN
INTRAMUSCULAR | Status: DC | PRN
  Administered 2016-03-19: 2 mg via INTRAVENOUS

## 2016-03-19 MED ORDER — FENTANYL CITRATE (PF) 100 MCG/2ML IJ SOLN
INTRAMUSCULAR | Status: DC | PRN
  Administered 2016-03-19 (×4): 25 ug via INTRAVENOUS

## 2016-03-19 SURGICAL SUPPLY — 68 items
BANDAGE ELASTIC 4 LF NS (GAUZE/BANDAGES/DRESSINGS) ×3 IMPLANT
BENZOIN TINCTURE PRP APPL 2/3 (GAUZE/BANDAGES/DRESSINGS) ×3 IMPLANT
BLADE CRESCENTIC (BLADE) IMPLANT
BLADE MED AGGRESSIVE (BLADE) IMPLANT
BLADE MINI RND TIP GREEN BEAV (BLADE) ×3 IMPLANT
BLADE OSC/SAGITTAL 5.5X25 (BLADE) IMPLANT
BLADE OSC/SAGITTAL MD 5.5X18 (BLADE) ×3 IMPLANT
BLADE OSC/SAGITTAL MD 9X18.5 (BLADE) IMPLANT
BLADE OSCILLATING/SAGITTAL (BLADE)
BLADE SW THK.38XMED LNG THN (BLADE) IMPLANT
BNDG ESMARK 4X12 TAN STRL LF (GAUZE/BANDAGES/DRESSINGS) ×3 IMPLANT
BNDG GAUZE 4.5X4.1 6PLY STRL (MISCELLANEOUS) ×3 IMPLANT
BNDG STRETCH 4X75 STRL LF (GAUZE/BANDAGES/DRESSINGS) ×3 IMPLANT
BUR EGG 4X8 MED (BURR) IMPLANT
BUR STRYKR EGG 5.0 (BURR) IMPLANT
BUR SURG 4X8 MED (BURR) ×2 IMPLANT
BUR SURG RND 4.0X8 FLTD (BURR) ×3 IMPLANT
BURR SURG 4X8 MED (BURR) ×3
CANISTER SUCT 1200ML W/VALVE (MISCELLANEOUS) ×3 IMPLANT
CAST PADDING 3X4FT ST 30246 (SOFTGOODS)
COVER LIGHT HANDLE UNIVERSAL (MISCELLANEOUS) ×6 IMPLANT
COVER PIN YLW 0.028-062 (MISCELLANEOUS) ×3 IMPLANT
CUFF TOURN SGL QUICK 18 (TOURNIQUET CUFF) ×3 IMPLANT
DRAPE FLUOR MINI C-ARM 54X84 (DRAPES) ×3 IMPLANT
DRESSING TELFA 4X3 1S ST N-ADH (GAUZE/BANDAGES/DRESSINGS) ×3 IMPLANT
DRILL WIRE PASS (DRILL) IMPLANT
DURAPREP 26ML APPLICATOR (WOUND CARE) ×3 IMPLANT
GAUZE PETRO XEROFOAM 1X8 (MISCELLANEOUS) ×3 IMPLANT
GAUZE SPONGE 4X4 12PLY STRL (GAUZE/BANDAGES/DRESSINGS) ×3 IMPLANT
GLOVE BIO SURGEON STRL SZ8 (GLOVE) ×3 IMPLANT
GOWN STRL REUS W/ TWL LRG LVL3 (GOWN DISPOSABLE) ×2 IMPLANT
GOWN STRL REUS W/ TWL XL LVL3 (GOWN DISPOSABLE) ×2 IMPLANT
GOWN STRL REUS W/TWL LRG LVL3 (GOWN DISPOSABLE) ×1
GOWN STRL REUS W/TWL XL LVL3 (GOWN DISPOSABLE) ×1
K-WIRE DBL END TROCAR 6X.045 (WIRE)
K-WIRE DBL END TROCAR 6X.062 (WIRE)
K-wire (Wire) ×3 IMPLANT
KIT ROOM TURNOVER OR (KITS) ×3 IMPLANT
KWIRE DBL END TROCAR 6X.045 (WIRE) IMPLANT
KWIRE DBL END TROCAR 6X.062 (WIRE) IMPLANT
NEEDLE HYPO 18GX1.5 BLUNT FILL (NEEDLE) IMPLANT
NEEDLE HYPO 25GX1X1/2 BEV (NEEDLE) ×3 IMPLANT
NS IRRIG 500ML POUR BTL (IV SOLUTION) ×3 IMPLANT
PACK EXTREMITY ARMC (MISCELLANEOUS) ×3 IMPLANT
PAD CAST CTTN 3X4 STRL (SOFTGOODS) IMPLANT
PAD GROUND ADULT SPLIT (MISCELLANEOUS) ×3 IMPLANT
RASP SM TEAR CROSS CUT (RASP) IMPLANT
SPLINT CAST 1 STEP 4X30 (MISCELLANEOUS) ×3 IMPLANT
SPLINT FAST PLASTER 5X30 (CAST SUPPLIES)
SPLINT PLASTER CAST FAST 5X30 (CAST SUPPLIES) IMPLANT
STOCKINETTE STRL 6IN 960660 (GAUZE/BANDAGES/DRESSINGS) ×3 IMPLANT
STRAP BODY AND KNEE 60X3 (MISCELLANEOUS) ×3 IMPLANT
STRIP CLOSURE SKIN 1/4X4 (GAUZE/BANDAGES/DRESSINGS) ×3 IMPLANT
SUT ETHILON 4-0 (SUTURE)
SUT ETHILON 4-0 FS2 18XMFL BLK (SUTURE)
SUT ETHILON 5-0 FS-2 18 BLK (SUTURE) ×3 IMPLANT
SUT VIC AB 1 CT1 36 (SUTURE) IMPLANT
SUT VIC AB 2-0 CT1 27 (SUTURE)
SUT VIC AB 2-0 CT1 TAPERPNT 27 (SUTURE) IMPLANT
SUT VIC AB 2-0 SH 27 (SUTURE)
SUT VIC AB 2-0 SH 27XBRD (SUTURE) IMPLANT
SUT VIC AB 3-0 SH 27 (SUTURE) ×1
SUT VIC AB 3-0 SH 27X BRD (SUTURE) ×2 IMPLANT
SUT VIC AB 4-0 FS2 27 (SUTURE) ×6 IMPLANT
SUT VICRYL AB 3-0 FS1 BRD 27IN (SUTURE) IMPLANT
SUTURE ETHLN 4-0 FS2 18XMF BLK (SUTURE) IMPLANT
SYRINGE 10CC LL (SYRINGE) ×3 IMPLANT
k-wire (Wire) ×3 IMPLANT

## 2016-03-19 NOTE — Anesthesia Procedure Notes (Signed)
Procedure Name: LMA Insertion Performed by: Cameron Ali Pre-anesthesia Checklist: Patient identified, Emergency Drugs available, Suction available, Timeout performed and Patient being monitored Patient Re-evaluated:Patient Re-evaluated prior to inductionOxygen Delivery Method: Circle system utilized Preoxygenation: Pre-oxygenation with 100% oxygen Intubation Type: IV induction Ventilation: Mask ventilation without difficulty LMA: LMA inserted LMA Size: 4.0 Number of attempts: 1 Placement Confirmation: positive ETCO2 and breath sounds checked- equal and bilateral Tube secured with: Tape Dental Injury: Teeth and Oropharynx as per pre-operative assessment

## 2016-03-19 NOTE — Transfer of Care (Signed)
Immediate Anesthesia Transfer of Care Note  Patient: Joanna Rodriguez  Procedure(s) Performed: Procedure(s) with comments: METATARSAL OSTEOTOMY HEAD 2ND LEFT (Left) - LMA WITH LOCAL HAMMER TOE CORRECTION (Left) - Third left toe.  Patient Location: PACU  Anesthesia Type: General  Level of Consciousness: awake, alert  and patient cooperative  Airway and Oxygen Therapy: Patient Spontanous Breathing and Patient connected to supplemental oxygen  Post-op Assessment: Post-op Vital signs reviewed, Patient's Cardiovascular Status Stable, Respiratory Function Stable, Patent Airway and No signs of Nausea or vomiting  Post-op Vital Signs: Reviewed and stable  Complications: No apparent anesthesia complications

## 2016-03-19 NOTE — Anesthesia Postprocedure Evaluation (Signed)
Anesthesia Post Note  Patient: Joanna Rodriguez  Procedure(s) Performed: Procedure(s) (LRB): METATARSAL OSTEOTOMY HEAD 2ND LEFT (Left) HAMMER TOE CORRECTION (Left)  Patient location during evaluation: PACU Anesthesia Type: General Level of consciousness: awake and alert Pain management: pain level controlled Vital Signs Assessment: post-procedure vital signs reviewed and stable Respiratory status: spontaneous breathing, nonlabored ventilation, respiratory function stable and patient connected to nasal cannula oxygen Cardiovascular status: blood pressure returned to baseline and stable Postop Assessment: no signs of nausea or vomiting Anesthetic complications: no    Alix Stowers

## 2016-03-19 NOTE — H&P (Signed)
H and P has been reviewed and no changes are noted.  

## 2016-03-19 NOTE — Op Note (Signed)
Operative note   Surgeon: Dr. Albertine Patricia, DPM.    Assistant: None    Preop diagnosis: 1. 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 Hammertoe deformity third toe left foot 3. Flexion contracture fourth toe left foot    Postop diagnosis: Same    Procedure:   1. Capsulotomy with exostectomy to the second metatarsal head and base of the proximal phalanx of the second toe with K wire fixation   2. Hammertoe repair with PIPJ fusion K wire fixation third toe left foot   3. Percutaneous flexor tendon release fourth toe left foot     EBL: Less than 5 cc    Anesthesia:general delivered by anesthesia team and the local delivered by me consisting of 8 cc of 0.5% Marcaine plain at the base of the operative sites.    Hemostasis: Ankle tourniquet 250 mils mercury pressure for 40 minutes    Specimen: Degenerative bone and cartilage from second metatarsal phalangeal joint    Complications: None    Operative indications: Patient chronic pain and discomfort to the left foot with radiographic evidence of avascular necrosis to the metatarsal head and base of the proximal phalanx of the second metatarsophalangeal joint. Third toe has gradually become more contracted and hammered and patient has had some continued problems on those lines with being able to wear shoes surgeries previous infected. Fourth toes also contracted in adductovarus position.    Procedure:  Patient was brought into the OR and placed on the operating table in thesupine position. After anesthesia was obtained theleft lower extremity was prepped and draped in usual sterile fashion.  Operative Report: This time to his directed to the second metatarsophalangeal joint where a 3 cemented dorsolinear skin incision made and deepened sharp blunt dissection bleeders clamped and bovied as required. Surgery begun was here in the past and the extensor tendon was  scarred and significantly to the capsular tissue overlying the joint. This was freed and incision made through the periosteum capsular tissue down to bone. The capsule tissue was then dissected mediolaterally away from the metatarsal head and base proximal phalanx. Severe degenerative changes including a hyperostosis and cartilage damage was noted the degenerative changes were removed using a combination of power saw and bur and rongeurs. Once an appropriate amount of bone was removed the area was copiously irrigated. Some of the base the proximal phalanx was removed but care was utilized to try to leave the as much of the bases possible. Significant degenerative changes are noted at the base of the proximal phalanx as well. At this point a 0.05 K wire was run percutaneously through the distal middle and proximal phalanx. There is a residual rod in the toe that I was able to stay lateral to and this was run across the metatarsophalangeal joint in order to hold the toe in a better overall position. There is checked FluoroScan good position correction were noted good reduction of the degenerative changes in the joint were noted. This point capsule tissue was interposed in between the areas and sutured with 3-0 Vicryl sutures. Remaining capsular tissues were then closed with 4 Vicryl in a continuous stitch as were deep superficial fascial layers. Skin was closed with 4 Vicryl in a subcuticular stitch.  At this time to his directed to the third toe of the left foot where a incision was made over the PIP joints and this was 2 cm in length. This was deepened sharp blunt dissection bleeders  clamped and bovied as required. Extension was then 5 incised transversely reflected proximally. Rocks phalanx head and distally from the middle phalanx base.  Was used to resect the articular cartilage off these areas the K wire was then run distally through the middle distal phalanges and retrograded into the proximal phalanx.  Holding toe in a corrected position this K wires and run across the metatarsophalangeal joint and shaft of the metatarsal. After She irrigation the extensor tendon was reapproximated with 3-0 Vicryl simple interrupted sutures. Skin was closed with 5-0 nylon horizontal mattress sutures.  This time to his directed the fourth toe of the left foot. Fourth toe had more flexion contracture with loading than anything else and I elected to do a percutaneous flexor tendon release using a 18-gauge beveled needle for the release. Toe seems senna better position once this was accomplished.  At this time 6 more cc of 0.5% Marcaine plain was injected base of the operative sites. Sterile compressive dressings placed across wound consisting of Steri-Strips Xeroform gauze 4 x 4's Kling and Kerlix. Tourniquet was released and prompt complete vascularity seen to return all digits.    Patient tolerated the procedure and anesthesia well.  Was transported from the OR to the PACU with all vital signs stable and vascular status intact. To be discharged per routine protocol.  Will follow up in approximately 1 week in the outpatient clinic.

## 2016-03-20 ENCOUNTER — Encounter: Payer: Self-pay | Admitting: Podiatry

## 2016-03-23 LAB — SURGICAL PATHOLOGY

## 2016-03-24 ENCOUNTER — Telehealth: Payer: Self-pay

## 2016-03-24 MED ORDER — TIOTROPIUM BROMIDE MONOHYDRATE 2.5 MCG/ACT IN AERS: 2.0000 | INHALATION_SPRAY | Freq: Two times a day (BID) | RESPIRATORY_TRACT | 0 refills | Status: DC

## 2016-03-24 NOTE — Telephone Encounter (Signed)
Spoke with pt who states she sent in an app for patient assistance for spiriva, her debit card was just charged yesterday. Pt feels she may not have enough medication to last her until her rx arrives via mail. Pt aware that a sample of spiriva will be left up front for her to come by to pick up. Pt voiced understanding. Nothing further needed.

## 2016-03-24 NOTE — Telephone Encounter (Signed)
Pt would like sample of Spiriva. States he pharmacy just charged her card yesterday, and she is afraid she will not have enough until it arrives. Please call.

## 2016-03-25 DIAGNOSIS — M8700 Idiopathic aseptic necrosis of unspecified bone: Secondary | ICD-10-CM | POA: Diagnosis not present

## 2016-03-31 ENCOUNTER — Telehealth: Payer: Self-pay | Admitting: Internal Medicine

## 2016-03-31 NOTE — Telephone Encounter (Signed)
Pt is calling stating that the company that was going to help with her [employer] She filled out the paperwork and charged her over 100 dollars and they asked her to contact us stating she needs [place] to send them a new prescription.  They will be contacting us for all this She is just making sure they have contacted [place] If not please call patient back.  She is in donut hole and can't afford the 100 they took from her and she also states they took that money but nothing was mailed or given to her.  Please advise.

## 2016-03-31 NOTE — Telephone Encounter (Signed)
Spoke with pt and informed we haven't heard from the company. She requested the phone and fax number which was given nothing further needed at this time.

## 2016-04-01 DIAGNOSIS — G8929 Other chronic pain: Secondary | ICD-10-CM | POA: Diagnosis not present

## 2016-04-01 DIAGNOSIS — M79672 Pain in left foot: Secondary | ICD-10-CM | POA: Diagnosis not present

## 2016-04-02 ENCOUNTER — Other Ambulatory Visit: Payer: Self-pay | Admitting: *Deleted

## 2016-04-02 MED ORDER — TIOTROPIUM BROMIDE MONOHYDRATE 2.5 MCG/ACT IN AERS: 2.0000 | INHALATION_SPRAY | Freq: Every day | RESPIRATORY_TRACT | 11 refills | Status: DC

## 2016-04-09 ENCOUNTER — Other Ambulatory Visit: Payer: Self-pay

## 2016-04-09 MED ORDER — TIOTROPIUM BROMIDE MONOHYDRATE 2.5 MCG/ACT IN AERS: 2.0000 | INHALATION_SPRAY | Freq: Every day | RESPIRATORY_TRACT | 0 refills | Status: DC

## 2016-04-09 NOTE — Telephone Encounter (Signed)
Patient calling to check status of this medication assistance program. Please call as patient has given them money and she still has not received medication.  Patient wants to know what to do.  She will be out of spireva in a few days.

## 2016-04-09 NOTE — Telephone Encounter (Signed)
Spoke with pt who is aware that I have faxed RX to medication assistance program today and she should be hearing from them within the next few days. I also inform pt that a sample of spiriva will be left up front for her to come by to pick up to get her by until her RX arrives. Pt voiced understanding and had no further questions. Nothing further needed.

## 2016-04-09 NOTE — Patient Outreach (Addendum)
[facility]) Care Management  04/09/2016  [place] 1950-08-25 [id number]     Telephone Screen  Referral Date:04/08/16 Referral Source: EMMI Prevent Call Referral Reason: "COPD, high blood pressure, Spiriva meds are too expensive"    Outreach attempt # 1 to patient. Patient reached and screening completed.   Social: Patient resides in her home alone. She has a supportive dtr and son in law that live nearby and check on her daily. She is independent with ADLs/IADLs. Patient drives herself to medical appts. She denies any recent falls. DME in the home include crutches and BP monitor.    Conditions: Patient has PMH of COPD, Crohn's colitis, HTN and reflux. She reports that after smoking for 53 yrs she quit smoking in Feb 2017. Patient states she has been told she has "mild COPD." She has SOB at times with exertion. Patient states she is monitoring BP and BP ranging in the 100-120's. She recalls that prior to COPD and Crohn's diagnosis she was healthy and never had to deal with sickness. Patient reports she is managing the best she can and trying to adhere to what her medical team tells her to do to stay healthy and live longer.    Medications: She reports taking five prescription meds and 7 OTC meds for mgmt of Crohn's. Patient reports she recently entered into doughnut hole. Her Spiriva that normally was $131 for 3 month supply is now $600. Patient states she was able to get sample from MD office. MD office also gave her info to apply for patient assistance program. Patient states that she applied and paid registration fee and first supply of med.However, she has not received med yet. She states she has been back in forth with MD office and patient assistance company. She is hopeful she will get her meds soon. She declined [place] referral as she wanted to wait to see outcome of assistance program. She voices that she was told to apply for Medicaid in which  she did. She reports she was not eligible but was told she qualifies for assistance with Medicare premiums. Patient states that if approval doesn't happen with patient assistance program then she will discuss with MD about changing to a cheaper a med. Patient wants to use this as a last resort since this med has been effective with managing her COPD.   Appointments: Patient has f/u appt with surgeon on 10/25 and lung MD(Dr. Stevenson Clinch) in Nov.    Advance Directives: Patient voices that she has living will and HCPOA. She states that her dtr did not think she would be abel to make those tough decisions if something happened so she designated her son in law-Larry Whiteside as [place]. Patient satets medical team has copy of her wishes.    Consent: [facility] services reviewed and discussed. Patient has no care coordination needs at this time. She is agreeable to RN health coach for further disease education and management.   Plan: RN CM will notify [facility] administrative assistant of case status. RN CM will send [facility] RN health coach referral for further disease management. RN CM provided patient with [facility] contact info.   Enzo Montgomery, RN,BSN,CCM [place] Management Telephonic Care Management Coordinator Direct Phone: [phone] Toll Free: [phone] Fax: [phone]

## 2016-04-09 NOTE — Addendum Note (Signed)
Addended by: Maryanna Shape A on: 04/09/2016 02:45 PM   Modules accepted: Orders

## 2016-04-16 ENCOUNTER — Ambulatory Visit: Payer: Commercial Managed Care - HMO | Admitting: Pharmacy Technician

## 2016-04-16 DIAGNOSIS — Z79899 Other long term (current) drug therapy: Secondary | ICD-10-CM

## 2016-04-16 NOTE — Progress Notes (Signed)
  Completed [facility] application and contract.  Patient agreed to all terms of the [facility] Management [facility] contract.  Patient is in the Medicare Part D coverage gap.    Patient stated that he has not worked since December 2016 due to health condition.  Patient indicated that she will not be returning to work.  Provided patient with community resource material based on her particular needs.    Patient is to contact providers:  Park Liter, Darren Allen Norris and Vilinda Boehringer to request that they electronically send prescriptions to [facility].  Patient made aware and understood that [facility] does not fill or dispense controlled or narcotic medications.  Spiriva Respimat PAP application prepared and sent to Dr. Vilinda Boehringer to sign.  Once signed by provider and returned to [facility].  PAP application will be sent to [employer].  Patient indicated during visit that she may like to change her Medicare Part D plan.  Instructed patient about open enrollment for Medicare.  Patient understood that the open enrollment period is between April 12, 2016 and June 04, 2016.  Appointment made for Medication Therapy Management appointment along with Medicare Part D consult.    [facility] [facility]

## 2016-04-20 ENCOUNTER — Other Ambulatory Visit: Payer: Self-pay

## 2016-04-20 ENCOUNTER — Telehealth: Payer: Self-pay | Admitting: Gastroenterology

## 2016-04-20 DIAGNOSIS — A048 Other specified bacterial intestinal infections: Secondary | ICD-10-CM

## 2016-04-20 NOTE — Telephone Encounter (Signed)
When will she need to retest after taking medication? Does she need to come by and pick anything up?

## 2016-04-20 NOTE — Telephone Encounter (Signed)
Pt advised she will be needing to repeat her stool after Wednesday of this week. Pt advised to go by [facility] to pick up stool kit.

## 2016-04-22 ENCOUNTER — Encounter: Payer: Self-pay | Admitting: *Deleted

## 2016-04-22 ENCOUNTER — Other Ambulatory Visit: Payer: Self-pay | Admitting: *Deleted

## 2016-04-22 DIAGNOSIS — J441 Chronic obstructive pulmonary disease with (acute) exacerbation: Secondary | ICD-10-CM

## 2016-04-22 DIAGNOSIS — A048 Other specified bacterial intestinal infections: Secondary | ICD-10-CM | POA: Diagnosis not present

## 2016-04-22 DIAGNOSIS — M2042 Other hammer toe(s) (acquired), left foot: Secondary | ICD-10-CM | POA: Diagnosis not present

## 2016-04-22 NOTE — Patient Outreach (Signed)
[facility]) Care Management  04/22/2016  Joanna Rodriguez 27-Nov-1950 [id number]   RN Health Coach telephone call to patient.  [employer] compliance verified. Per patient her COPD is fine and she doesn't feel she needs any additional knowledge on it. Her O2 saturations  are running 97% on room air. Patient doesn't feel she needs any additional information on her hypertension. Per patient her blood pressure has been running good 132/70. Per patient she is in the donut hole and needs assistance with her Spiriva and her mercaptopurine. She stated it is hard to afford this on her social security. Per patient she has filled out several applications and she is willing to talk with [place] to see if there is any assistance available.   ASSESSMENT: Patient states she does not need any additional education on Hypertension or COPD Plan:  Referral to pharmacy Case closed for DM. Patient declined services  [place] Management [phone]

## 2016-04-22 NOTE — Patient Outreach (Signed)
[facility]) Care Management  04/22/2016  [place] Mikenna Bunkley 07-Oct-1950 [id number]   RN Health Coach telephone call to patient.  [employer] compliance verified. Per patient she is in [place] and requested I call back after 3pm. She has a Dr appointment with podiatrist.  Plan: RN will call patient again after 3 pm today. [place] Care Management [phone]

## 2016-04-24 LAB — H. PYLORI ANTIGEN, STOOL: H pylori Ag, Stl: NEGATIVE

## 2016-04-27 ENCOUNTER — Telehealth: Payer: Self-pay

## 2016-04-27 NOTE — Telephone Encounter (Signed)
Pt notified of lab results

## 2016-04-27 NOTE — Telephone Encounter (Signed)
-----   Message from Lucilla Lame, MD sent at 04/24/2016  7:11 PM EDT ----- Let the patient know the H. Pylori was negative

## 2016-04-29 ENCOUNTER — Other Ambulatory Visit: Payer: Self-pay | Admitting: Family Medicine

## 2016-04-29 ENCOUNTER — Ambulatory Visit (INDEPENDENT_AMBULATORY_CARE_PROVIDER_SITE_OTHER): Payer: Commercial Managed Care - HMO | Admitting: Family Medicine

## 2016-04-29 ENCOUNTER — Encounter: Payer: Self-pay | Admitting: Family Medicine

## 2016-04-29 VITALS — BP 138/72 | HR 77 | Temp 97.8°F | Wt 144.1 lb

## 2016-04-29 DIAGNOSIS — I10 Essential (primary) hypertension: Secondary | ICD-10-CM | POA: Diagnosis not present

## 2016-04-29 DIAGNOSIS — E538 Deficiency of other specified B group vitamins: Secondary | ICD-10-CM | POA: Diagnosis not present

## 2016-04-29 DIAGNOSIS — J432 Centrilobular emphysema: Secondary | ICD-10-CM | POA: Diagnosis not present

## 2016-04-29 MED ORDER — LISINOPRIL 5 MG PO TABS: 5.0000 mg | ORAL_TABLET | Freq: Every day | ORAL | 1 refills | Status: DC

## 2016-04-29 NOTE — Assessment & Plan Note (Signed)
Rechecking labs today. Await results.

## 2016-04-29 NOTE — Assessment & Plan Note (Signed)
Under good control. Continue current regimen. Continue to monitor.  

## 2016-04-29 NOTE — Assessment & Plan Note (Signed)
Stable. Needs referral back to Dr. Stevenson Clinch as she's used up her visits. Call with any concerns.

## 2016-04-29 NOTE — Progress Notes (Signed)
BP 138/72   Pulse 77   Temp 97.8 F (36.6 C)   Wt 144 lb 1.6 oz (65.4 kg)   SpO2 96%   BMI 28.14 kg/m    Subjective:    Patient ID: Joanna Rodriguez, female    DOB: 1950/08/23, 65 y.o.   MRN: [id number]  HPI: Joanna Rodriguez is a 65 y.o. female  Chief Complaint  Patient presents with  . COPD  . Hypertension   HYPERTENSION Hypertension status: controlled  Satisfied with current treatment? yes Duration of hypertension: chronic BP monitoring frequency:  not checking BP medication side effects:  no Medication compliance: excellent compliance Aspirin: yes Recurrent headaches: no Visual changes: no Palpitations: no Dyspnea: no Chest pain: no Lower extremity edema: no Dizzy/lightheaded: no  COPD COPD status: controlled Satisfied with current treatment?: yes Oxygen use: no Dyspnea frequency: rarely Cough frequency: rarely Rescue inhaler frequency: never Limitation of activity: no Productive cough: no Pneumovax: Up to Date Influenza: Up to Date  ANEMIA Anemia status: controlled Etiology of anemia: B12 deficiency Duration of anemia treatment: chronic Compliance with treatment: excellent compliance B12 supplementation side effects: no Severity of anemia: mild Fatigue: no Decreased exercise tolerance: no  Dyspnea on exertion: no Palpitations: no Bleeding: no Pica: no   Relevant past medical, surgical, family and social history reviewed and updated as indicated. Interim medical history since our last visit reviewed. Allergies and medications reviewed and updated.  Review of Systems  Constitutional: Negative.   Respiratory: Negative.   Cardiovascular: Negative.   Musculoskeletal: Positive for arthralgias. Negative for back pain, gait problem, joint swelling, myalgias, neck pain and neck stiffness.  Psychiatric/Behavioral: Negative.     Per HPI unless specifically indicated above     Objective:    BP 138/72   Pulse 77   Temp 97.8 F  (36.6 C)   Wt 144 lb 1.6 oz (65.4 kg)   SpO2 96%   BMI 28.14 kg/m   Wt Readings from Last 3 Encounters:  04/29/16 144 lb 1.6 oz (65.4 kg)  03/19/16 140 lb (63.5 kg)  03/05/16 139 lb (63 kg)    Physical Exam  Constitutional: She is oriented to person, place, and time. She appears well-developed and well-nourished. No distress.  HENT:  Head: Normocephalic and atraumatic.  Right Ear: Hearing normal.  Left Ear: Hearing normal.  Nose: Nose normal.  Eyes: Conjunctivae and lids are normal. Right eye exhibits no discharge. Left eye exhibits no discharge. No scleral icterus.  Cardiovascular: Normal rate, regular rhythm, normal heart sounds and intact distal pulses.  Exam reveals no gallop and no friction rub.   No murmur heard. Pulmonary/Chest: Effort normal and breath sounds normal. No respiratory distress. She has no wheezes. She has no rales. She exhibits no tenderness.  Musculoskeletal: Normal range of motion.  Neurological: She is alert and oriented to person, place, and time.  Skin: Skin is warm, dry and intact. No rash noted. No erythema. No pallor.  Psychiatric: She has a normal mood and affect. Her speech is normal and behavior is normal. Judgment and thought content normal. Cognition and memory are normal.  Nursing note and vitals reviewed.   Results for orders placed or performed in visit on 04/20/16  H. pylori antigen, stool  Result Value Ref Range   H pylori Ag, Stl Negative Negative      Assessment & Plan:   Problem List Items Addressed This Visit      Cardiovascular and Mediastinum   HTN (hypertension) - Primary  Under good control. Continue current regimen. Continue to monitor.       Relevant Medications   lisinopril (PRINIVIL,ZESTRIL) 5 MG tablet   Other Relevant Orders   Comprehensive metabolic panel     Respiratory   COPD (chronic obstructive pulmonary disease) (HCC)    Stable. Needs referral back to Dr. Stevenson Clinch as she's used up her visits. Call with any  concerns.         Other   B12 deficiency    Rechecking labs today. Await results.       Relevant Orders   CBC with Differential/Platelet   B12 and Folate Panel    Other Visit Diagnoses   None.      Follow up plan: Return in about 6 months (around 10/27/2016) for Wellness.

## 2016-04-30 LAB — CBC WITH DIFFERENTIAL/PLATELET
Basophils Absolute: 0 10*3/uL (ref 0.0–0.2)
Basos: 1 %
EOS (ABSOLUTE): 0.1 10*3/uL (ref 0.0–0.4)
EOS: 1 %
HEMATOCRIT: 36.3 % (ref 34.0–46.6)
Hemoglobin: 12.2 g/dL (ref 11.1–15.9)
IMMATURE GRANULOCYTES: 0 %
Immature Grans (Abs): 0 10*3/uL (ref 0.0–0.1)
Lymphocytes Absolute: 1.9 10*3/uL (ref 0.7–3.1)
Lymphs: 34 %
MCH: 33.7 pg — ABNORMAL HIGH (ref 26.6–33.0)
MCHC: 33.6 g/dL (ref 31.5–35.7)
MCV: 100 fL — ABNORMAL HIGH (ref 79–97)
MONOCYTES: 8 %
MONOS ABS: 0.4 10*3/uL (ref 0.1–0.9)
NEUTROS PCT: 56 %
Neutrophils Absolute: 3.1 10*3/uL (ref 1.4–7.0)
Platelets: 437 10*3/uL — ABNORMAL HIGH (ref 150–379)
RBC: 3.62 x10E6/uL — AB (ref 3.77–5.28)
RDW: 14.7 % (ref 12.3–15.4)
WBC: 5.6 10*3/uL (ref 3.4–10.8)

## 2016-04-30 LAB — COMPREHENSIVE METABOLIC PANEL
ALK PHOS: 51 IU/L (ref 39–117)
ALT: 59 IU/L — ABNORMAL HIGH (ref 0–32)
AST: 39 IU/L (ref 0–40)
Albumin/Globulin Ratio: 1.3 (ref 1.2–2.2)
Albumin: 3.5 g/dL — ABNORMAL LOW (ref 3.6–4.8)
BILIRUBIN TOTAL: 0.3 mg/dL (ref 0.0–1.2)
BUN/Creatinine Ratio: 24 (ref 12–28)
BUN: 15 mg/dL (ref 8–27)
CHLORIDE: 103 mmol/L (ref 96–106)
CO2: 27 mmol/L (ref 18–29)
Calcium: 9.5 mg/dL (ref 8.7–10.3)
Creatinine, Ser: 0.62 mg/dL (ref 0.57–1.00)
GFR calc non Af Amer: 94 mL/min/{1.73_m2} (ref >59)
GFR, EST AFRICAN AMERICAN: 108 mL/min/{1.73_m2} (ref >59)
Globulin, Total: 2.6 g/dL (ref 1.5–4.5)
Glucose: 101 mg/dL — ABNORMAL HIGH (ref 65–99)
Potassium: 4.8 mmol/L (ref 3.5–5.2)
Sodium: 143 mmol/L (ref 134–144)
TOTAL PROTEIN: 6.1 g/dL (ref 6.0–8.5)

## 2016-04-30 LAB — B12 AND FOLATE PANEL: Vitamin B-12: 517 pg/mL (ref 211–946)

## 2016-05-06 DIAGNOSIS — M659 Synovitis and tenosynovitis, unspecified: Secondary | ICD-10-CM | POA: Diagnosis not present

## 2016-05-07 ENCOUNTER — Encounter: Payer: Commercial Managed Care - HMO | Admitting: Pharmacist

## 2016-05-18 ENCOUNTER — Ambulatory Visit: Payer: Commercial Managed Care - HMO | Admitting: Internal Medicine

## 2016-05-27 ENCOUNTER — Ambulatory Visit (INDEPENDENT_AMBULATORY_CARE_PROVIDER_SITE_OTHER): Payer: Commercial Managed Care - HMO | Admitting: Internal Medicine

## 2016-05-27 ENCOUNTER — Encounter: Payer: Self-pay | Admitting: Internal Medicine

## 2016-05-27 VITALS — BP 136/88 | HR 78 | Ht 59.0 in | Wt 144.0 lb

## 2016-05-27 DIAGNOSIS — J9383 Other pneumothorax: Secondary | ICD-10-CM

## 2016-05-27 DIAGNOSIS — J432 Centrilobular emphysema: Secondary | ICD-10-CM | POA: Diagnosis not present

## 2016-05-27 NOTE — Assessment & Plan Note (Signed)
Hx of 2 left spontaneous PTX in Jan and Feb 2015, probably secondary to a bleb and coughing spells. She is has a severe emphysema per her CT. Now s\p talc Left pleurodesis. Given that she has severe emphysema on her current CT chest, she still at high risk for pneumothorax (especially on the right), cough control and avoidance of tobacco will be paramount in avoiding lowering her risk for another pneumothorax.  Plan: -Management as stated for COPD. -Avoid tobacco (all forms of it).

## 2016-05-27 NOTE — Patient Instructions (Signed)
Follow up Dr. Mortimer Fries in 3 months - cont with Spiriva. - cont with allergy control. - cont with diet and exercise as tolerated.

## 2016-05-27 NOTE — Progress Notes (Signed)
MRN# [id number] Joanna Rodriguez May 17, 1951   CC: Chief Complaint  Patient presents with  . Follow-up    breathing doing well; sneezing; dry cough       Brief History: 09/03/14 This is a pleasant 65 year old female referred by Dr. Durwin Nora for COPD evaluation and workup along with episodes of 2 spontaneous pneumothorax in the last 2 months. Below is her smoking and history of COPD: - 1 episode of bronchitis per year, not every year - No intubations prior to recent hospitalization - No childhood asthma - Pet dog at home - Quit smoking Nov 2015 - No seasonal allergies - Left chest wall tenderness - Still with DOE - can walk to mailbox, or about 3 car lenghts  Patient stated that in Nov 2015 she had a bad "bronchitis" for which he continued to coughing spells and sob, in Jan 2015 had significant SOB, CXR showed L pneumothorax. Had chest tube placed at time, then in Feb 2015, had a another spontaneous pneumothorax which required chest tube placed along with talc pleurodesis.  During per talc pleurodesis procedure Dr. Genevive Bi noted a single bleb on the left side.  Currently patient states that she is doing well, still with shortness of breath and cough, but overall with improvement.  She is currently not smoking  PLAN - stop tobacco, pfts, 8mt   Events since last clinic visit: Patient presents today for a follow up visit of COPD and hx of recurrent PTX s\p decort.  Doing well today, now has good financial assistance for her meds, and is very pleased Breathing is well overall, mild dry cough and sneezing.  S\p left foot surgery of three middle toes, had general anesthesia, no breathing problems reported afterwards.   Medication:    Current Outpatient Prescriptions:  .  acyclovir (ZOVIRAX) 400 MG tablet, Take 1 tablet (400 mg total) by mouth daily. (Patient taking differently: Take 400 mg by mouth daily. Takes as needed), Disp: 90 tablet, Rfl: 3 .  Alum & Mag Hydroxide-Simeth  (ANTACID ANTI-GAS MAX STRENGTH PO), Take 1 each by mouth daily. Reported on 11/21/2015, Disp: , Rfl:  .  aspirin EC 81 MG tablet, Take 81 mg by mouth daily., Disp: , Rfl:  .  B Complex-Folic Acid (B COMPLEX-VITAMIN B12 PO), Take 1 tablet by mouth daily., Disp: , Rfl:  .  BLACK COHOSH PO, Take by mouth daily., Disp: , Rfl:  .  Calcium Carb-Cholecalciferol 600-800 MG-UNIT TABS, Take by mouth 2 (two) times daily., Disp: , Rfl:  .  Co-Enzyme Q-10 30 MG CAPS, Take 1 capsule by mouth daily., Disp: , Rfl:  .  Glucosamine-Chondroit-Vit C-Mn (SM GLUCOSAMINE/CHONDROITIN PO), Take 1,500 mg by mouth 2 (two) times daily., Disp: , Rfl:  .  levothyroxine (SYNTHROID, LEVOTHROID) 100 MCG tablet, TAKE 1 TABLET BY MOUTH DAILY , Disp: 90 tablet, Rfl: 3 .  lisinopril (PRINIVIL,ZESTRIL) 5 MG tablet, Take 1 tablet (5 mg total) by mouth daily., Disp: 90 tablet, Rfl: 1 .  Melatonin 5 MG TABS, Take 5 mg by mouth at bedtime as needed., Disp: , Rfl:  .  mercaptopurine (PURINETHOL) 50 MG tablet, TAKE 1 AND 1/2 TABLETS EVERY DAY, Disp: 135 tablet, Rfl: 3 .  Multiple Vitamins-Minerals (MULTIVITAMIN WITH MINERALS) tablet, Take 1 tablet by mouth daily., Disp: , Rfl:  .  Omega-3 Fatty Acids (FISH OIL PO), Take 1,200 mg by mouth 2 (two) times daily., Disp: , Rfl:  .  oxyCODONE-acetaminophen (PERCOCET) 7.5-325 MG tablet, Take 1 tablet by mouth every 4 (  four) hours as needed for severe pain., Disp: 30 tablet, Rfl: 0 .  pantoprazole (PROTONIX) 40 MG tablet, Take 1 tablet (40 mg total) by mouth daily., Disp: 90 tablet, Rfl: 3 .  Tiotropium Bromide Monohydrate (SPIRIVA RESPIMAT) 2.5 MCG/ACT AERS, Inhale 2 puffs into the lungs daily. Gargle and rinse after each use, Disp: 1 Inhaler, Rfl: 11 .  traMADol (ULTRAM) 50 MG tablet, TAKE 1 TABLET THREE TIMES DAILY (Patient taking differently: TAKE 1 TABLET THREE TIMES DAILY PRN), Disp: 270 tablet, Rfl: 1    Review of Systems: Gen:  Denies  fever, sweats, chills HEENT: Denies blurred vision,  double vision, ear pain, eye pain, hearing loss, nose bleeds, sore throat Cvc:  No dizziness, chest pain or heaviness Resp:   Admits to: Mild dry cough (chornic - improving).  Gi: Denies swallowing difficulty, stomach pain, nausea or vomiting, diarrhea, constipation, bowel incontinence Gu:  Denies bladder incontinence, burning urine Ext:   Right foot pain Skin: No skin rash, easy bruising or bleeding or hives Endoc:  No polyuria, polydipsia , polyphagia or weight change Other:  All other systems negative  Allergies:  Nsaids  Physical Examination:  VS: BP 136/88 (BP Location: Left Arm, Cuff Size: Normal)   Pulse 78   Ht 4' 11"  (1.499 m)   Wt 144 lb (65.3 kg)   SpO2 100%   BMI 29.08 kg/m   General Appearance: No distress  HEENT: PERRLA, no ptosis, no other lesions noticed Pulmonary:normal breath sounds., diaphragmatic excursion normal.No wheezing, No rales   Cardiovascular:  Normal S1,S2.  No m/r/g.     Abdomen:Exam: Benign, Soft, non-tender, No masses  Skin:   warm, no rashes, no ecchymosis  Extremities: normal, no cyanosis, clubbing, warm with normal capillary refill.     Assessment and Plan: 65 year old female past medical history of moderate COPD, pulmonary bullae, pneumothorax, status post thorascopy presented for follow-up visit COPD (chronic obstructive pulmonary disease) Clinically patient with  COPD, and with obstruction noted on her pulmonary function testing. Patient was educated on pneumothorax and COPD, she was noted to have 1 bullae during her last hospitalization, but with severe emphysema noted on a CAT scan, and she is at increased risk for developing more bullae in the future (especially if she resumes smoking).  She states she's back to baseline breathing currently, walking about 10-15 miles per day, and has stopped smoking completely. Was having moderate to severe reflux in the evenings with Advair and Qvar, has stopped all ICS.  Decided not to pursue Arnuity due  to cost.  Currently on Spiriva, with good symptom control  Plan: - cont with current dose Spiriva, - Continue to  avoid tobacco. - cont with diet and exercise, daily walking.         Spontaneous pneumothorax Hx of 2 left spontaneous PTX in Jan and Feb 2015, probably secondary to a bleb and coughing spells. She is has a severe emphysema per her CT. Now s\p talc Left pleurodesis. Given that she has severe emphysema on her current CT chest, she still at high risk for pneumothorax (especially on the right), cough control and avoidance of tobacco will be paramount in avoiding lowering her risk for another pneumothorax.  Plan: -Management as stated for COPD. -Avoid tobacco (all forms of it).     Updated Medication List Outpatient Encounter Prescriptions as of 05/27/2016  Medication Sig  . acyclovir (ZOVIRAX) 400 MG tablet Take 1 tablet (400 mg total) by mouth daily. (Patient taking differently: Take 400 mg by mouth  daily. Takes as needed)  . Alum & Mag Hydroxide-Simeth (ANTACID ANTI-GAS MAX STRENGTH PO) Take 1 each by mouth daily. Reported on 11/21/2015  . aspirin EC 81 MG tablet Take 81 mg by mouth daily.  . B Complex-Folic Acid (B COMPLEX-VITAMIN B12 PO) Take 1 tablet by mouth daily.  Marland Kitchen BLACK COHOSH PO Take by mouth daily.  . Calcium Carb-Cholecalciferol 600-800 MG-UNIT TABS Take by mouth 2 (two) times daily.  Marland Kitchen Co-Enzyme Q-10 30 MG CAPS Take 1 capsule by mouth daily.  . Glucosamine-Chondroit-Vit C-Mn (SM GLUCOSAMINE/CHONDROITIN PO) Take 1,500 mg by mouth 2 (two) times daily.  Marland Kitchen levothyroxine (SYNTHROID, LEVOTHROID) 100 MCG tablet TAKE 1 TABLET BY MOUTH DAILY   . lisinopril (PRINIVIL,ZESTRIL) 5 MG tablet Take 1 tablet (5 mg total) by mouth daily.  . Melatonin 5 MG TABS Take 5 mg by mouth at bedtime as needed.  . mercaptopurine (PURINETHOL) 50 MG tablet TAKE 1 AND 1/2 TABLETS EVERY DAY  . Multiple Vitamins-Minerals (MULTIVITAMIN WITH MINERALS) tablet Take 1 tablet by mouth daily.   . Omega-3 Fatty Acids (FISH OIL PO) Take 1,200 mg by mouth 2 (two) times daily.  Marland Kitchen oxyCODONE-acetaminophen (PERCOCET) 7.5-325 MG tablet Take 1 tablet by mouth every 4 (four) hours as needed for severe pain.  . pantoprazole (PROTONIX) 40 MG tablet Take 1 tablet (40 mg total) by mouth daily.  . Tiotropium Bromide Monohydrate (SPIRIVA RESPIMAT) 2.5 MCG/ACT AERS Inhale 2 puffs into the lungs daily. Gargle and rinse after each use  . traMADol (ULTRAM) 50 MG tablet TAKE 1 TABLET THREE TIMES DAILY (Patient taking differently: TAKE 1 TABLET THREE TIMES DAILY PRN)   No facility-administered encounter medications on file as of 05/27/2016.     Orders for this visit: No orders of the defined types were placed in this encounter.   Thank  you for the visitation and for allowing  [facility] to assist in the care of your patient. Our recommendations are noted above.  Please contact us if we can be of further service.  Vilinda Boehringer, MD [facility] Office Number: [phone]

## 2016-05-27 NOTE — Assessment & Plan Note (Signed)
Clinically patient with  COPD, and with obstruction noted on her pulmonary function testing. Patient was educated on pneumothorax and COPD, she was noted to have 1 bullae during her last hospitalization, but with severe emphysema noted on a CAT scan, and she is at increased risk for developing more bullae in the future (especially if she resumes smoking).  She states she's back to baseline breathing currently, walking about 10-15 miles per day, and has stopped smoking completely. Was having moderate to severe reflux in the evenings with Advair and Qvar, has stopped all ICS.  Decided not to pursue Arnuity due to cost.  Currently on Spiriva, with good symptom control  Plan: - cont with current dose Spiriva, - Continue to  avoid tobacco. - cont with diet and exercise, daily walking.

## 2016-06-11 ENCOUNTER — Other Ambulatory Visit: Payer: Self-pay | Admitting: Internal Medicine

## 2016-06-11 ENCOUNTER — Other Ambulatory Visit: Payer: Self-pay | Admitting: Gastroenterology

## 2016-06-16 ENCOUNTER — Telehealth: Payer: Self-pay | Admitting: Internal Medicine

## 2016-06-16 NOTE — Telephone Encounter (Signed)
Pt is calling states [employer] is requestion additional information for Spiriva

## 2016-06-17 ENCOUNTER — Telehealth: Payer: Self-pay | Admitting: Internal Medicine

## 2016-06-17 MED ORDER — TIOTROPIUM BROMIDE MONOHYDRATE 2.5 MCG/ACT IN AERS: 2.0000 | INHALATION_SPRAY | Freq: Every day | RESPIRATORY_TRACT | 3 refills | Status: DC

## 2016-06-17 NOTE — Telephone Encounter (Signed)
Spoke with pt, who states [employer] left a VM for her requiring additional information for her spiriva Rx. I have advised pt to contact [employer] so we can get a better understanding as to what they are needing. Pt states she will attempt to call [employer] again. Pt voiced understanding and had no further questions.   Nothing further needed at this time.

## 2016-06-17 NOTE — Telephone Encounter (Signed)
Patient called and stated [employer] sent a fax over regarding trimidol.

## 2016-06-17 NOTE — Telephone Encounter (Signed)
°*  STAT* If patient is at the pharmacy, call can be transferred to refill team.   1. Which medications need to be refilled? (please list name of each medication and dose if known) spriva   2. Which pharmacy/location (including street and city if local pharmacy) is medication to be sent to? [facility]   3. Do they need a 30 day or 90 day supply? 90 day

## 2016-06-17 NOTE — Telephone Encounter (Signed)
Rx sent to preferred pharmacy. Pt aware and voiced her understanding. Nothing further needed.  

## 2016-06-23 ENCOUNTER — Other Ambulatory Visit: Payer: Self-pay | Admitting: Pharmacist

## 2016-06-23 NOTE — Patient Outreach (Signed)
[facility]) Care Management  06/23/2016  Joanna Rodriguez Apr 11, 1951 [id number]  65 year old female referred to [place] for medication assistance.  Per referral patient is in the donut hole and  having difficulty affording her Spiriva and her Mercaptopurine.  Called patient today to discuss medication assistance via telephone. Patient states has been approved for Extra Help/Low Income Subsidy and states that she has 90 day supply of all of her medications.  She reports adherence to her medications and states she does not need medication assistance at this time.  Plan: [place] will not open case and will sign off as patient has already been approved for patient assistance.  Please reconsult if needed.  Bennye Alm, PharmD, [place] PGY2 Pharmacy Resident [phone]

## 2016-06-28 ENCOUNTER — Other Ambulatory Visit: Payer: Self-pay | Admitting: Gastroenterology

## 2016-07-13 DIAGNOSIS — M2041 Other hammer toe(s) (acquired), right foot: Secondary | ICD-10-CM | POA: Diagnosis not present

## 2016-07-13 DIAGNOSIS — M216X1 Other acquired deformities of right foot: Secondary | ICD-10-CM | POA: Diagnosis not present

## 2016-07-13 DIAGNOSIS — M7751 Other enthesopathy of right foot: Secondary | ICD-10-CM | POA: Diagnosis not present

## 2016-07-23 ENCOUNTER — Telehealth: Payer: Self-pay | Admitting: Family Medicine

## 2016-07-23 NOTE — Telephone Encounter (Signed)
Joanna Rodriguez what a HNP form is, it is probably surgical clearance, if so she will need an office visit.

## 2016-07-23 NOTE — Telephone Encounter (Signed)
Patient is scheduled to have surgery on 02/08 -Dr Elvina Mattes but she is needing a HNP form filled out by Dr Wynetta Emery.  She is wanting to know if she needs to make an appt to have her do it or can Dr Wynetta Emery fill this out without seeing her at this time.  Please advise. Thanks  [phone]

## 2016-07-23 NOTE — Telephone Encounter (Signed)
Called patient scheduled her to come in to see Dr Wynetta Emery for clearance for her surgery.  Thanks

## 2016-07-27 ENCOUNTER — Encounter: Payer: Self-pay | Admitting: Family Medicine

## 2016-07-27 ENCOUNTER — Telehealth: Payer: Self-pay | Admitting: Pulmonary Disease

## 2016-07-27 ENCOUNTER — Ambulatory Visit (INDEPENDENT_AMBULATORY_CARE_PROVIDER_SITE_OTHER): Payer: Commercial Managed Care - HMO | Admitting: Family Medicine

## 2016-07-27 VITALS — BP 126/77 | HR 73 | Temp 98.1°F | Wt 149.4 lb

## 2016-07-27 DIAGNOSIS — R635 Abnormal weight gain: Secondary | ICD-10-CM

## 2016-07-27 DIAGNOSIS — F17201 Nicotine dependence, unspecified, in remission: Secondary | ICD-10-CM | POA: Diagnosis not present

## 2016-07-27 DIAGNOSIS — Z01818 Encounter for other preprocedural examination: Secondary | ICD-10-CM

## 2016-07-27 MED ORDER — BUPROPION HCL ER (SR) 150 MG PO TB12: ORAL_TABLET | ORAL | 3 refills | Status: DC

## 2016-07-27 NOTE — Addendum Note (Signed)
Addended by: Valerie Roys on: 07/27/2016 11:36 AM   Modules accepted: Orders

## 2016-07-27 NOTE — Telephone Encounter (Signed)
Spoke with Tiffany with [facility], who states pt is scheduled for hammer toe surgery on 08-06-16. Pt has hx of emphysema and was last seen 05-27-16. Dr. Wynetta Emery with [facility] is requesting surgical clearance.  DS please advise. Thanks.

## 2016-07-27 NOTE — Telephone Encounter (Signed)
Pt is having foot surgery on 2/8. PCP is asking does pt need to be cleared. Please advise.

## 2016-07-27 NOTE — Telephone Encounter (Signed)
Lm for Joanna Rodriguez with [facility] to return our call.  Will await call back.

## 2016-07-27 NOTE — Progress Notes (Addendum)
BP 126/77 (BP Location: Left Arm, Patient Position: Sitting, Cuff Size: Normal)   Pulse 73   Temp 98.1 F (36.7 C)   Wt 149 lb 6.4 oz (67.8 kg)   SpO2 100%   BMI 30.18 kg/m    Subjective:    Patient ID: Joanna Rodriguez, female    DOB: 1950/07/01, 66 y.o.   MRN: [id number]  HPI: Joanna Rodriguez is a 66 y.o. female  Chief Complaint  Patient presents with  . Surgical Clearance   Never had any problems with surgery in the past. Never had trouble being extubated. Last surgery was in September. No family history of any problems with anesthesia. She is otherwise doing well and feeling well today. She does note that she has been having a lot more cravings to smoke. She has not yet, but it's getting harder. She would like something to help with the cravings. She is otherwise doing well with no concerns.   Active Ambulatory Problems    Diagnosis Date Noted  . COPD (chronic obstructive pulmonary disease) ([place]) 09/06/2014  . Dyspnea 09/06/2014  . Spontaneous pneumothorax 09/17/2014  . B12 deficiency 03/06/2015  . CC (Crohn's colitis) ([place]) 03/06/2015  . Genital herpes 03/06/2015  . HTN (hypertension) 05/20/2015  . Thyroid activity decreased 05/20/2015  . Osteopenia 07/04/2015  . Stricture and stenosis of esophagus   . Trouble swallowing   . Reflux esophagitis   . Gastritis   . Preop pulmonary/respiratory exam 03/05/2016  . Tobacco abuse, in remission 07/27/2016   Resolved Ambulatory Problems    Diagnosis Date Noted  . Tobacco abuse counseling 09/17/2014  . Disease of thyroid gland 03/06/2015  . Personal history of tobacco use, presenting hazards to health 10/23/2015   Past Medical History:  Diagnosis Date  . Arthritis   . Cancer ([place])   . COPD (chronic obstructive pulmonary disease) ([place])   . Crohn's disease ([place])   . Dyspnea on exertion   . Genital herpes   . Hypertension   . Hypothyroid   . Kidney stones   . Osteopenia   . Personal history of tobacco use,  presenting hazards to health 10/23/2015  . Scoliosis   . Spontaneous pneumothorax   . Vertigo   . [facility])    Past Surgical History:  Procedure Laterality Date  . ABDOMINAL HYSTERECTOMY    . APPENDECTOMY    . BREAST EXCISIONAL BIOPSY Bilateral    -  both in 1990's  . CAPSULOTOMY METATARSOPHALANGEAL Left 03/19/2016   Procedure: CAPSULOTOMY METATARSOPHALANGEAL;  Surgeon: Albertine Patricia, DPM;  Location: [place];  Service: Podiatry;  Laterality: Left;  Second left toe.  . ESOPHAGOGASTRODUODENOSCOPY (EGD) WITH PROPOFOL N/A 11/21/2015   Procedure: ESOPHAGOGASTRODUODENOSCOPY (EGD) WITH PROPOFOL with dialation;  Surgeon: Lucilla Lame, MD;  Location: [place];  Service: Endoscopy;  Laterality: N/A;  . FLEXOR TENDON REPAIR Left 03/19/2016   Procedure: FLEXOR TENDON release, percutaneous.;  Surgeon: Albertine Patricia, DPM;  Location: [place];  Service: Podiatry;  Laterality: Left;  4th left toe.  Marland Kitchen HAMMER TOE SURGERY Left 03/19/2016   Procedure: HAMMER TOE CORRECTION with PIPjoint fusion;  Surgeon: Albertine Patricia, DPM;  Location: [place];  Service: Podiatry;  Laterality: Left;  Third left toe.  Marland Kitchen TALC PLEURODESIS  07/2014   left side  . THORACOSCOPY  07/2014   left side  . TUBAL LIGATION    . VOLVULUS REDUCTION     Outpatient Encounter Prescriptions as of 07/27/2016  Medication Sig Note  .  acyclovir (ZOVIRAX) 400 MG tablet Take 1 tablet (400 mg total) by mouth daily. (Patient taking differently: Take 400 mg by mouth daily. Takes as needed)   . Alum & Mag Hydroxide-Simeth (ANTACID ANTI-GAS MAX STRENGTH PO) Take 1 each by mouth daily. Reported on 11/21/2015 01/23/2015: Received from: External Pharmacy Received Sig:   . aspirin EC 81 MG tablet Take 81 mg by mouth daily. 09/06/2014: Received from: [place]:   . B Complex-Folic Acid (B COMPLEX-VITAMIN B12 PO) Take 1 tablet by mouth daily. 09/06/2014: Received from: External  Pharmacy Received Sig:   . BLACK COHOSH PO Take by mouth daily.   Marland Kitchen buPROPion (WELLBUTRIN SR) 150 MG 12 hr tablet 1 tab daily for 1 week, then 1 tab BID   . Calcium Carb-Cholecalciferol 600-800 MG-UNIT TABS Take by mouth 2 (two) times daily.   Marland Kitchen Co-Enzyme Q-10 30 MG CAPS Take 1 capsule by mouth daily. 09/06/2014: Received from: [place]:   . Glucosamine-Chondroit-Vit C-Mn (SM GLUCOSAMINE/CHONDROITIN PO) Take 1,500 mg by mouth 2 (two) times daily.   Marland Kitchen levothyroxine (SYNTHROID, LEVOTHROID) 100 MCG tablet TAKE 1 TABLET BY MOUTH DAILY    . lisinopril (PRINIVIL,ZESTRIL) 5 MG tablet Take 1 tablet (5 mg total) by mouth daily.   . Melatonin 5 MG TABS Take 5 mg by mouth at bedtime as needed. 09/06/2014: Received from: External Pharmacy Received Sig:   . mercaptopurine (PURINETHOL) 50 MG tablet TAKE 1 AND 1/2 TABLETS EVERY DAY   . Multiple Vitamins-Minerals (MULTIVITAMIN WITH MINERALS) tablet Take 1 tablet by mouth daily. 09/06/2014: Received from: [place]:   . Omega-3 Fatty Acids (FISH OIL PO) Take 1,200 mg by mouth 2 (two) times daily.   . pantoprazole (PROTONIX) 40 MG tablet Take 1 tablet (40 mg total) by mouth daily.   . Tiotropium Bromide Monohydrate (SPIRIVA RESPIMAT) 2.5 MCG/ACT AERS Inhale 2 puffs into the lungs daily. Gargle and rinse after each use   . traMADol (ULTRAM) 50 MG tablet TAKE 1 TABLET THREE TIMES DAILY   . [DISCONTINUED] buPROPion (WELLBUTRIN SR) 150 MG 12 hr tablet 1 tab daily for 1 week, then 1 tab BID   . [DISCONTINUED] oxyCODONE-acetaminophen (PERCOCET) 7.5-325 MG tablet Take 1 tablet by mouth every 4 (four) hours as needed for severe pain.    No facility-administered encounter medications on file as of 07/27/2016.    Allergies  Allergen Reactions  . Nsaids     Other reaction(s): Other (See Comments) Caused Crohn's flare up.   Social History   Social History  . Marital status: Divorced    Spouse name: N/A    . Number of children: N/A  . Years of education: N/A   Occupational History  . 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 Smoking status: Former Smoker    Packs/day: 1.00    Years: 53.00    Types: Cigarettes    Quit date: 06/29/2014  . Smokeless tobacco: Never Used     Comment: NOV 16,2015  . Alcohol use 0.0 oz/week     Comment: occasional - 2-3x/yr  . Drug use: No  . Sexual activity: Not on file   Other Topics Concern  . Not on file   Social History Narrative  . No narrative on file   Family History  Problem Relation Age of Onset  . Hypertension Mother   . Hypothyroidism Mother   . Alcohol  abuse Mother   . Breast cancer Mother 8  . Hyperlipidemia Mother   . Mental illness Mother   . Heart attack Father   . Heart disease Father   . Glaucoma Father   . Colon cancer Father 73  . Diabetes Father   . Hypertension Father   . Heart disease Sister   . Hyperlipidemia Sister   . Hypertension Sister   . Lung disease Sister     Review of Systems  Constitutional: Negative.   HENT: Negative.   Respiratory: Negative.   Cardiovascular: Negative.   Gastrointestinal: Negative.   Genitourinary: Negative.   Musculoskeletal: Positive for arthralgias. Negative for back pain, gait problem, joint swelling, myalgias, neck pain and neck stiffness.  Neurological: Negative.   Hematological: Negative.   Psychiatric/Behavioral: Negative.     Per HPI unless specifically indicated above     Objective:    BP 126/77 (BP Location: Left Arm, Patient Position: Sitting, Cuff Size: Normal)   Pulse 73   Temp 98.1 F (36.7 C)   Wt 149 lb 6.4 oz (67.8 kg)   SpO2 100%   BMI 30.18 kg/m   Wt Readings from Last 3 Encounters:  07/27/16 149 lb 6.4 oz (67.8 kg)  05/27/16 144 lb (65.3 kg)  04/29/16 144 lb 1.6 oz (65.4 kg)    Physical Exam  Constitutional: She is oriented to person, place, and time. She appears well-developed and well-nourished. No  distress.  HENT:  Head: Normocephalic and atraumatic.  Right Ear: Hearing and external ear normal.  Left Ear: Hearing and external ear normal.  Nose: Nose normal.  Mouth/Throat: Oropharynx is clear and moist. No oropharyngeal exudate.  Eyes: Conjunctivae, EOM and lids are normal. Pupils are equal, round, and reactive to light. Right eye exhibits no discharge. Left eye exhibits no discharge. No scleral icterus.  Neck: Normal range of motion. Neck supple. No JVD present. No tracheal deviation present. No thyromegaly present.  Cardiovascular: Normal rate, regular rhythm, normal heart sounds and intact distal pulses.  Exam reveals no gallop and no friction rub.   No murmur heard. Pulmonary/Chest: Effort normal and breath sounds normal. No stridor. No respiratory distress. She has no wheezes. She has no rales. She exhibits no tenderness.  Abdominal: Soft. Bowel sounds are normal. She exhibits no distension and no mass. There is no tenderness. There is no rebound and no guarding.  Musculoskeletal: Normal range of motion. She exhibits no edema, tenderness or deformity.  Lymphadenopathy:    She has no cervical adenopathy.  Neurological: She is alert and oriented to person, place, and time. She has normal reflexes. She displays normal reflexes. No cranial nerve deficit. She exhibits normal muscle tone. Coordination normal.  Skin: Skin is warm, dry and intact. No rash noted. She is not diaphoretic. No erythema. No pallor.  Psychiatric: She has a normal mood and affect. Her speech is normal and behavior is normal. Judgment and thought content normal. Cognition and memory are normal.  Nursing note and vitals reviewed.   Results for orders placed or performed in visit on 04/29/16  CBC with Differential/Platelet  Result Value Ref Range   WBC 5.6 3.4 - 10.8 x10E3/uL   RBC 3.62 (L) 3.77 - 5.28 x10E6/uL   Hemoglobin 12.2 11.1 - 15.9 g/dL   Hematocrit 36.3 34.0 - 46.6 %   MCV 100 (H) 79 - 97 fL   MCH  33.7 (H) 26.6 - 33.0 pg   MCHC 33.6 31.5 - 35.7 g/dL   RDW 14.7 12.3 -  15.4 %   Platelets 437 (H) 150 - 379 x10E3/uL   Neutrophils 56 Not Estab. %   Lymphs 34 Not Estab. %   Monocytes 8 Not Estab. %   Eos 1 Not Estab. %   Basos 1 Not Estab. %   Neutrophils Absolute 3.1 1.4 - 7.0 x10E3/uL   Lymphocytes Absolute 1.9 0.7 - 3.1 x10E3/uL   Monocytes Absolute 0.4 0.1 - 0.9 x10E3/uL   EOS (ABSOLUTE) 0.1 0.0 - 0.4 x10E3/uL   Basophils Absolute 0.0 0.0 - 0.2 x10E3/uL   Immature Granulocytes 0 Not Estab. %   Immature Grans (Abs) 0.0 0.0 - 0.1 x10E3/uL  B12 and Folate Panel  Result Value Ref Range   Vitamin B-12 517 211 - 946 pg/mL   Folate >20.0 >3.0 ng/mL  Comprehensive metabolic panel  Result Value Ref Range   Glucose 101 (H) 65 - 99 mg/dL   BUN 15 8 - 27 mg/dL   Creatinine, Ser 0.62 0.57 - 1.00 mg/dL   GFR calc non Af Amer 94 >59 mL/min/1.73   GFR calc Af Amer 108 >59 mL/min/1.73   BUN/Creatinine Ratio 24 12 - 28   Sodium 143 134 - 144 mmol/L   Potassium 4.8 3.5 - 5.2 mmol/L   Chloride 103 96 - 106 mmol/L   CO2 27 18 - 29 mmol/L   Calcium 9.5 8.7 - 10.3 mg/dL   Total Protein 6.1 6.0 - 8.5 g/dL   Albumin 3.5 (L) 3.6 - 4.8 g/dL   Globulin, Total 2.6 1.5 - 4.5 g/dL   Albumin/Globulin Ratio 1.3 1.2 - 2.2   Bilirubin Total 0.3 0.0 - 1.2 mg/dL   Alkaline Phosphatase 51 39 - 117 IU/L   AST 39 0 - 40 IU/L   ALT 59 (H) 0 - 32 IU/L      Assessment & Plan:   Problem List Items Addressed This Visit      Other   Tobacco abuse, in remission    Concern for starting smoking again. Will start her on wellbutrin. Recheck 1 month. Call with any concerns.       Other Visit Diagnoses    Pre-procedural examination    -  Primary   Checking labs today. EKG normal. Cleared by pulmonology 03/05/16- checking to see if they need to see her again. Pending results, should be cleared for surgery.   Relevant Orders   Comprehensive metabolic panel   CBC with Differential/Platelet   EKG 12-Lead  (Completed)   Weight gain       Will check labs. Await results.    Relevant Orders   TSH   Comprehensive metabolic panel   CBC with Differential/Platelet      Addendum 07/28/16 10:34AM: Per Message from pulmonology,  I have never seen this patient before (previously seen by Dr Stevenson Clinch). However, I have reviewed her prior PFTs. She has moderate COPD by that study. Hammer toe surgery should be a low risk procedure with regard to risk of peri-operative pulmonary complications. If her respiratory status is at her baseline, there should be no problems proceeding with surgery.    Merton Border, MD  PCCM service  Mobile [phone]  Pager [phone]  07/28/2016   Patient is at baseline currently with her breathing. Does not need to see pulmonology. Labs normal. Cleared for surgery.   Follow up plan: Return in about 4 weeks (around 08/24/2016) for Follow up wellbutrin.

## 2016-07-27 NOTE — Assessment & Plan Note (Signed)
Concern for starting smoking again. Will start her on wellbutrin. Recheck 1 month. Call with any concerns.

## 2016-07-28 LAB — COMPREHENSIVE METABOLIC PANEL
A/G RATIO: 1.3 (ref 1.2–2.2)
ALK PHOS: 58 IU/L (ref 39–117)
ALT: 51 IU/L — AB (ref 0–32)
AST: 40 IU/L (ref 0–40)
Albumin: 3.6 g/dL (ref 3.6–4.8)
BILIRUBIN TOTAL: 0.4 mg/dL (ref 0.0–1.2)
BUN/Creatinine Ratio: 22 (ref 12–28)
BUN: 16 mg/dL (ref 8–27)
CALCIUM: 9.7 mg/dL (ref 8.7–10.3)
CHLORIDE: 105 mmol/L (ref 96–106)
CO2: 26 mmol/L (ref 18–29)
Creatinine, Ser: 0.74 mg/dL (ref 0.57–1.00)
GFR calc Af Amer: 97 mL/min/{1.73_m2} (ref >59)
GFR calc non Af Amer: 84 mL/min/{1.73_m2} (ref >59)
GLOBULIN, TOTAL: 2.8 g/dL (ref 1.5–4.5)
Glucose: 81 mg/dL (ref 65–99)
POTASSIUM: 5.1 mmol/L (ref 3.5–5.2)
SODIUM: 145 mmol/L — AB (ref 134–144)
Total Protein: 6.4 g/dL (ref 6.0–8.5)

## 2016-07-28 LAB — CBC WITH DIFFERENTIAL/PLATELET
BASOS ABS: 0.1 10*3/uL (ref 0.0–0.2)
BASOS: 1 %
EOS (ABSOLUTE): 0.1 10*3/uL (ref 0.0–0.4)
Eos: 1 %
Hematocrit: 35.8 % (ref 34.0–46.6)
Hemoglobin: 12.3 g/dL (ref 11.1–15.9)
IMMATURE GRANS (ABS): 0 10*3/uL (ref 0.0–0.1)
IMMATURE GRANULOCYTES: 0 %
LYMPHS: 35 %
Lymphocytes Absolute: 1.8 10*3/uL (ref 0.7–3.1)
MCH: 33.6 pg — ABNORMAL HIGH (ref 26.6–33.0)
MCHC: 34.4 g/dL (ref 31.5–35.7)
MCV: 98 fL — AB (ref 79–97)
MONOS ABS: 0.4 10*3/uL (ref 0.1–0.9)
Monocytes: 8 %
NEUTROS PCT: 55 %
Neutrophils Absolute: 2.8 10*3/uL (ref 1.4–7.0)
Platelets: 392 10*3/uL — ABNORMAL HIGH (ref 150–379)
RBC: 3.66 x10E6/uL — AB (ref 3.77–5.28)
RDW: 15 % (ref 12.3–15.4)
WBC: 5.1 10*3/uL (ref 3.4–10.8)

## 2016-07-28 LAB — TSH: TSH: 1.36 u[IU]/mL (ref 0.450–4.500)

## 2016-07-28 NOTE — Telephone Encounter (Signed)
I have never seen this patient before (previously seen by Dr Stevenson Clinch). However, I have reviewed her prior PFTs. She has moderate COPD by that study. Hammer toe surgery should be a low risk procedure with regard to risk of peri-operative pulmonary complications. If her respiratory status is at her baseline, there should be no problems proceeding with surgery.  Merton Border, MD PCCM service Mobile [phone] Pager [phone] 07/28/2016

## 2016-07-28 NOTE — Telephone Encounter (Signed)
I have sent Dr. Wynetta Emery a staff message to make her aware of below message, per Dr. Durenda Age nurse's request. Nothing further needed.

## 2016-07-29 DIAGNOSIS — M2041 Other hammer toe(s) (acquired), right foot: Secondary | ICD-10-CM | POA: Diagnosis not present

## 2016-07-29 DIAGNOSIS — M7751 Other enthesopathy of right foot: Secondary | ICD-10-CM | POA: Diagnosis not present

## 2016-07-29 DIAGNOSIS — M216X1 Other acquired deformities of right foot: Secondary | ICD-10-CM | POA: Diagnosis not present

## 2016-07-30 ENCOUNTER — Encounter: Payer: Self-pay | Admitting: *Deleted

## 2016-08-03 ENCOUNTER — Other Ambulatory Visit: Payer: Self-pay | Admitting: Pharmacist

## 2016-08-03 NOTE — Discharge Instructions (Signed)
[place] DR. Cooper   1. Take your medication as prescribed.  Pain medication should be taken only as needed.  2. Keep the dressing clean, dry and intact.  3. Keep your foot elevated above the heart level for the first 48 hours.  4. Walking to the bathroom and brief periods of walking are acceptable, unless we have instructed you to be non-weight bearing.  5. Always wear your post-op shoe when walking.  Always use your crutches if you are to be non-weight bearing.  6. Do not take a shower. Baths are permissible as long as the foot is kept out of the water.   7. Every hour you are awake:  - Bend your knee 15 times. - Flex foot 15 times - Massage calf 15 times  8. Call [facility] [phone]) if any of the following problems occur: - You develop a temperature or fever. - The bandage becomes saturated with blood. - Medication does not stop your pain. - Injury of the foot occurs. - Any symptoms of infection including redness, odor, or red streaks running from wound.   General Anesthesia, Adult, Care After These instructions provide you with information about caring for yourself after your procedure. Your health care provider may also give you more specific instructions. Your treatment has been planned according to current medical practices, but problems sometimes occur. Call your health care provider if you have any problems or questions after your procedure. What can I expect after the procedure? After the procedure, it is common to have:  Vomiting.  A sore throat.  Mental slowness. It is common to feel:  Nauseous.  Cold or shivery.  Sleepy.  Tired.  Sore or achy, even in parts of your body where you did not have surgery. Follow these instructions at home: For at least 24 hours after the procedure:  Do not:  Participate in  activities where you could fall or become injured.  Drive.  Use heavy machinery.  Drink alcohol.  Take sleeping pills or medicines that cause drowsiness.  Make important decisions or sign legal documents.  Take care of children on your own.  Rest. Eating and drinking  If you vomit, drink water, juice, or soup when you can drink without vomiting.  Drink enough fluid to keep your urine clear or pale yellow.  Make sure you have little or no nausea before eating solid foods.  Follow the diet recommended by your health care provider. General instructions  Have a responsible adult stay with you until you are awake and alert.  Return to your normal activities as told by your health care provider. Ask your health care provider what activities are safe for you.  Take over-the-counter and prescription medicines only as told by your health care provider.  If you smoke, do not smoke without supervision.  Keep all follow-up visits as told by your health care provider. This is important. Contact a health care provider if:  You continue to have nausea or vomiting at home, and medicines are not helpful.  You cannot drink fluids or start eating again.  You cannot urinate after 8-12 hours.  You develop a skin rash.  You have fever.  You have increasing redness at the site of your procedure. Get help right away if:  You have difficulty breathing.  You have chest pain.  You have unexpected bleeding.  You feel that you are having  a life-threatening or urgent problem. This information is not intended to replace advice given to you by your health care provider. Make sure you discuss any questions you have with your health care provider. Document Released: 09/21/2000 Document Revised: 11/18/2015 Document Reviewed: 05/30/2015 Elsevier Interactive Patient Education  2017 [employer].

## 2016-08-03 NOTE — Patient Outreach (Signed)
[facility]) Care Management  08/03/2016  Joanna Rodriguez Dec 21, 1950 [id number]  66 year old female referred to [facility] for medication assistance in December 2017.  Case was incorrectly closed.    Plan: [facility] will not open case and will sign off as patient has already been approved for patient assistance.  Please reconsult if needed  Bennye Alm, PharmD, [place] PGY2 Pharmacy Resident [phone]

## 2016-08-06 ENCOUNTER — Ambulatory Visit: Payer: Medicare HMO | Admitting: Anesthesiology

## 2016-08-06 ENCOUNTER — Ambulatory Visit
Admission: RE | Admit: 2016-08-06 | Discharge: 2016-08-06 | Disposition: A | Discharge status: Home / Self Care | Payer: Medicare HMO | Source: Ambulatory Visit | Attending: Podiatry | Admitting: Podiatry

## 2016-08-06 ENCOUNTER — Encounter
Admission: RE | Disposition: A | Discharge status: Home / Self Care | Payer: Self-pay | Source: Ambulatory Visit | Attending: Podiatry

## 2016-08-06 DIAGNOSIS — M2041 Other hammer toe(s) (acquired), right foot: Secondary | ICD-10-CM | POA: Diagnosis not present

## 2016-08-06 DIAGNOSIS — J449 Chronic obstructive pulmonary disease, unspecified: Secondary | ICD-10-CM | POA: Diagnosis not present

## 2016-08-06 DIAGNOSIS — Z87891 Personal history of nicotine dependence: Secondary | ICD-10-CM | POA: Diagnosis not present

## 2016-08-06 DIAGNOSIS — S92331A Displaced fracture of third metatarsal bone, right foot, initial encounter for closed fracture: Secondary | ICD-10-CM | POA: Diagnosis not present

## 2016-08-06 DIAGNOSIS — X58XXXA Exposure to other specified factors, initial encounter: Secondary | ICD-10-CM | POA: Diagnosis not present

## 2016-08-06 DIAGNOSIS — G8929 Other chronic pain: Secondary | ICD-10-CM | POA: Insufficient documentation

## 2016-08-06 DIAGNOSIS — M7751 Other enthesopathy of right foot: Secondary | ICD-10-CM | POA: Diagnosis not present

## 2016-08-06 DIAGNOSIS — S92321A Displaced fracture of second metatarsal bone, right foot, initial encounter for closed fracture: Secondary | ICD-10-CM | POA: Insufficient documentation

## 2016-08-06 DIAGNOSIS — I10 Essential (primary) hypertension: Secondary | ICD-10-CM | POA: Diagnosis not present

## 2016-08-06 DIAGNOSIS — M216X1 Other acquired deformities of right foot: Secondary | ICD-10-CM | POA: Diagnosis not present

## 2016-08-06 HISTORY — PX: WEIL OSTEOTOMY: SHX5044

## 2016-08-06 HISTORY — PX: HAMMER TOE SURGERY: SHX385

## 2016-08-06 SURGERY — CORRECTION, HAMMER TOE
Anesthesia: Monitor Anesthesia Care | Site: Foot | Laterality: Right | Wound class: Clean

## 2016-08-06 MED ORDER — MIDAZOLAM HCL 2 MG/2ML IJ SOLN
INTRAMUSCULAR | Status: DC | PRN
  Administered 2016-08-06: 2 mg via INTRAVENOUS

## 2016-08-06 MED ORDER — LIDOCAINE HCL (PF) 1 % IJ SOLN
INTRAMUSCULAR | Status: DC | PRN
  Administered 2016-08-06: 5 mL

## 2016-08-06 MED ORDER — CEFAZOLIN SODIUM-DEXTROSE 2-4 GM/100ML-% IV SOLN
2.0000 g | Freq: Once | INTRAVENOUS | Status: AC
  Administered 2016-08-06: 2 g via INTRAVENOUS

## 2016-08-06 MED ORDER — ONDANSETRON HCL 4 MG/2ML IJ SOLN
INTRAMUSCULAR | Status: DC | PRN
  Administered 2016-08-06: 4 mg via INTRAVENOUS

## 2016-08-06 MED ORDER — GLYCOPYRROLATE 0.2 MG/ML IJ SOLN
INTRAMUSCULAR | Status: DC | PRN
  Administered 2016-08-06: 0.1 mg via INTRAVENOUS

## 2016-08-06 MED ORDER — DEXAMETHASONE SODIUM PHOSPHATE 4 MG/ML IJ SOLN
INTRAMUSCULAR | Status: DC | PRN
  Administered 2016-08-06: 4 mg via INTRAVENOUS

## 2016-08-06 MED ORDER — FENTANYL CITRATE (PF) 100 MCG/2ML IJ SOLN
INTRAMUSCULAR | Status: DC | PRN
  Administered 2016-08-06 (×2): 25 ug via INTRAVENOUS
  Administered 2016-08-06: 50 ug via INTRAVENOUS

## 2016-08-06 MED ORDER — LIDOCAINE HCL (CARDIAC) 20 MG/ML IV SOLN
INTRAVENOUS | Status: DC | PRN
  Administered 2016-08-06: 30 mg via INTRAVENOUS

## 2016-08-06 MED ORDER — ACETAMINOPHEN 325 MG PO TABS: 325.0000 mg | ORAL_TABLET | ORAL | Status: DC | PRN

## 2016-08-06 MED ORDER — BUPIVACAINE HCL (PF) 0.25 % IJ SOLN
INTRAMUSCULAR | Status: DC | PRN
  Administered 2016-08-06: 6 mL
  Administered 2016-08-06: 5 mL

## 2016-08-06 MED ORDER — OXYCODONE-ACETAMINOPHEN 7.5-325 MG PO TABS: 1.0000 | ORAL_TABLET | Freq: Four times a day (QID) | ORAL | 0 refills | Status: DC | PRN

## 2016-08-06 MED ORDER — PROPOFOL 500 MG/50ML IV EMUL
INTRAVENOUS | Status: DC | PRN
Start: 2016-08-06 — End: 2016-08-06
  Administered 2016-08-06: 75 ug/kg/min via INTRAVENOUS

## 2016-08-06 MED ORDER — LACTATED RINGERS IV SOLN
INTRAVENOUS | Status: DC
  Administered 2016-08-06: 10:00:00 via INTRAVENOUS

## 2016-08-06 MED ORDER — ACETAMINOPHEN 160 MG/5ML PO SOLN: 325.0000 mg | ORAL | Status: DC | PRN

## 2016-08-06 SURGICAL SUPPLY — 70 items
0.9MM WIRE ×10 IMPLANT
BANDAGE ELASTIC 4 LF NS (GAUZE/BANDAGES/DRESSINGS) ×2 IMPLANT
BENZOIN TINCTURE PRP APPL 2/3 (GAUZE/BANDAGES/DRESSINGS) ×2 IMPLANT
BIT DRILL 1.7 LNG CANN (DRILL) ×2 IMPLANT
BLADE CRESCENTIC (BLADE) IMPLANT
BLADE MED AGGRESSIVE (BLADE) IMPLANT
BLADE MINI RND TIP GREEN BEAV (BLADE) ×2 IMPLANT
BLADE OSC/SAGITTAL 5.5X25 (BLADE) IMPLANT
BLADE OSC/SAGITTAL MD 5.5X18 (BLADE) ×2 IMPLANT
BLADE OSC/SAGITTAL MD 9X18.5 (BLADE) IMPLANT
BLADE OSCILLATING/SAGITTAL (BLADE) ×1
BLADE SW THK.38XMED LNG THN (BLADE) ×1 IMPLANT
BNDG ESMARK 4X12 TAN STRL LF (GAUZE/BANDAGES/DRESSINGS) ×2 IMPLANT
BNDG GAUZE 4.5X4.1 6PLY STRL (MISCELLANEOUS) ×2 IMPLANT
BNDG STRETCH 4X75 STRL LF (GAUZE/BANDAGES/DRESSINGS) ×2 IMPLANT
BUR EGG 4X8 MED (BURR) IMPLANT
BUR STRYKR EGG 5.0 (BURR) IMPLANT
CANISTER SUCT 1200ML W/VALVE (MISCELLANEOUS) ×2 IMPLANT
CAST PADDING 3X4FT ST 30246 (SOFTGOODS) ×2
CNTRSNK DRL 2 HDLS SCR (MISCELLANEOUS) ×1 IMPLANT
COUNTERSINK 2.0 (MISCELLANEOUS) ×1
COVER LIGHT HANDLE UNIVERSAL (MISCELLANEOUS) ×4 IMPLANT
COVER PIN YLW 0.028-062 (MISCELLANEOUS) ×4 IMPLANT
CUFF TOURN SGL QUICK 18 (TOURNIQUET CUFF) ×2 IMPLANT
DRAPE FLUOR MINI C-ARM 54X84 (DRAPES) ×2 IMPLANT
DRILL WIRE PASS (DRILL) IMPLANT
DURAPREP 26ML APPLICATOR (WOUND CARE) ×2 IMPLANT
ETHIBOND 2 0 GREEN CT 2 30IN (SUTURE) ×2 IMPLANT
GAUZE PETRO XEROFOAM 1X8 (MISCELLANEOUS) ×2 IMPLANT
GAUZE SPONGE 4X4 12PLY STRL (GAUZE/BANDAGES/DRESSINGS) ×2 IMPLANT
GLOVE BIO SURGEON STRL SZ8 (GLOVE) ×2 IMPLANT
GOWN STRL REUS W/ TWL LRG LVL3 (GOWN DISPOSABLE) ×1 IMPLANT
GOWN STRL REUS W/ TWL XL LVL3 (GOWN DISPOSABLE) ×1 IMPLANT
GOWN STRL REUS W/TWL LRG LVL3 (GOWN DISPOSABLE) ×1
GOWN STRL REUS W/TWL XL LVL3 (GOWN DISPOSABLE) ×1
K-WIRE DBL END TROCAR 6X.045 (WIRE)
K-WIRE DBL END TROCAR 6X.062 (WIRE)
KIT ROOM TURNOVER OR (KITS) ×2 IMPLANT
KWIRE DBL END TROCAR 6X.045 (WIRE) IMPLANT
KWIRE DBL END TROCAR 6X.062 (WIRE) IMPLANT
NEEDLE HYPO 18GX1.5 BLUNT FILL (NEEDLE) ×2 IMPLANT
NEEDLE HYPO 25GX1X1/2 BEV (NEEDLE) ×2 IMPLANT
NS IRRIG 500ML POUR BTL (IV SOLUTION) ×2 IMPLANT
PACK EXTREMITY ARMC (MISCELLANEOUS) ×2 IMPLANT
PAD CAST CTTN 3X4 STRL (SOFTGOODS) ×2 IMPLANT
PAD GROUND ADULT SPLIT (MISCELLANEOUS) ×2 IMPLANT
RASP SM TEAR CROSS CUT (RASP) IMPLANT
SCREW 2X13MM STRL (Screw) ×4 IMPLANT
SCREW HEADLESS 2.0X14MM (Screw) ×4 IMPLANT
SPLINT CAST 1 STEP 4X30 (MISCELLANEOUS) ×2 IMPLANT
SPLINT FAST PLASTER 5X30 (CAST SUPPLIES) ×1
SPLINT PLASTER CAST FAST 5X30 (CAST SUPPLIES) ×1 IMPLANT
STOCKINETTE STRL 6IN 960660 (GAUZE/BANDAGES/DRESSINGS) ×2 IMPLANT
STRAP BODY AND KNEE 60X3 (MISCELLANEOUS) ×2 IMPLANT
STRIP CLOSURE SKIN 1/4X4 (GAUZE/BANDAGES/DRESSINGS) ×2 IMPLANT
SUT ETHILON 4-0 (SUTURE)
SUT ETHILON 4-0 FS2 18XMFL BLK (SUTURE)
SUT ETHILON 5-0 FS-2 18 BLK (SUTURE) ×2 IMPLANT
SUT VIC AB 1 CT1 36 (SUTURE) IMPLANT
SUT VIC AB 2-0 CT1 27 (SUTURE)
SUT VIC AB 2-0 CT1 TAPERPNT 27 (SUTURE) IMPLANT
SUT VIC AB 2-0 SH 27 (SUTURE)
SUT VIC AB 2-0 SH 27XBRD (SUTURE) IMPLANT
SUT VIC AB 3-0 SH 27 (SUTURE)
SUT VIC AB 3-0 SH 27X BRD (SUTURE) IMPLANT
SUT VIC AB 4-0 FS2 27 (SUTURE) ×2 IMPLANT
SUT VICRYL AB 3-0 FS1 BRD 27IN (SUTURE) IMPLANT
SUTURE ETHLN 4-0 FS2 18XMF BLK (SUTURE) IMPLANT
SYRINGE 10CC LL (SYRINGE) ×2 IMPLANT
k-wire (Wire) ×4 IMPLANT

## 2016-08-06 NOTE — Anesthesia Postprocedure Evaluation (Signed)
Anesthesia Post Note  Patient: Joanna Rodriguez  Procedure(s) Performed: Procedure(s) (LRB): HAMMER TOE CORRECTION  RIGHT 2ND AND 3RD (Right) WEIL right 2nd & 3rd (Right)  Patient location during evaluation: PACU Anesthesia Type: MAC Level of consciousness: awake and alert Pain management: pain level controlled Vital Signs Assessment: post-procedure vital signs reviewed and stable Respiratory status: spontaneous breathing, nonlabored ventilation, respiratory function stable and patient connected to nasal cannula oxygen Cardiovascular status: stable and blood pressure returned to baseline Anesthetic complications: no    Trecia Rogers

## 2016-08-06 NOTE — H&P (Signed)
H and P has been reviewed and no changes are noted.  

## 2016-08-06 NOTE — Anesthesia Preprocedure Evaluation (Signed)
Anesthesia Evaluation  Patient identified by MRN, date of birth, ID band Patient awake    Reviewed: Allergy & Precautions, H&P , NPO status , Patient's Chart, lab work & pertinent test results, reviewed documented beta blocker date and time   Airway Mallampati: I  TM Distance: >3 FB Neck ROM: full    Dental no notable dental hx.    Pulmonary shortness of breath, COPD,  COPD inhaler, former smoker,  Smoked for 53 years.  Does get SOB going up full flight of stairs   Pulmonary exam normal breath sounds clear to auscultation       Cardiovascular Exercise Tolerance: Good hypertension, negative cardio ROS Normal cardiovascular exam Rhythm:regular Rate:Normal     Neuro/Psych negative neurological ROS  negative psych ROS   GI/Hepatic negative GI ROS, Neg liver ROS,   Endo/Other  negative endocrine ROS  Renal/GU negative Renal ROS  negative genitourinary   Musculoskeletal   Abdominal   Peds  Hematology negative hematology ROS (+)   Anesthesia Other Findings   Reproductive/Obstetrics negative OB ROS                             Anesthesia Physical Anesthesia Plan  ASA: III  Anesthesia Plan: MAC   Post-op Pain Management:    Induction:   Airway Management Planned:   Additional Equipment:   Intra-op Plan:   Post-operative Plan:   Informed Consent: I have reviewed the patients History and Physical, chart, labs and discussed the procedure including the risks, benefits and alternatives for the proposed anesthesia with the patient or authorized representative who has indicated his/her understanding and acceptance.   Dental Advisory Given  Plan Discussed with: CRNA  Anesthesia Plan Comments:         Anesthesia Quick Evaluation

## 2016-08-06 NOTE — Op Note (Signed)
Operative note   Surgeon: Dr. Albertine Patricia, DPM.    Assistant: None    Preop diagnosis: 1. Plantar displaced second metatarsal right foot 2. Plantar displaced third metatarsal right foot 3. Hammertoe deformity second toe right foot 4. Hammertoe deformity third toe right foot    Postop diagnosis: Same    Procedure:   1. Osteotomy second metatarsal with screw fixation from [place] 2.2 mm screws   2. Osteotomy third metatarsal with 2.2 screw fixation from [place]. 2 screws utilize   3. Hammertoe repair second toe right foot with PIPJ arthrodesis and K wire fixation    4. Hammertoe repair third toe right foot with PIPJ arthrodesis K wire fixation     EBL: 10 cc    Anesthesia:general liver by the anesthesia team. A local block delivered by me was utilized prior to surgery utilizing 10 cc of lidocaine 1% and Marcaine 0.25% mixed 50-50. Secondary block was done at the end of the case with 0.25% Marcaine plain 8 cc were utilized    Hemostasis: Ankle tourniquet 225 mmHg pressure    Specimen: None    Complications: None    Operative indications: Chronic pain and deformity unresponsive to conservative care    Procedure:  Patient was brought into the OR and placed on the operating table in thesupine position. After anesthesia was obtained theright lower extremity was prepped and draped in usual sterile fashion.  Operative Report: This time attention was directed to the dorsum of the right foot to the area between the second and third metatarsal distal shaft and metatarsophalangeal joints. A 4 cm linear skin incision was made and deepened sharp blunt dissection bleeders clamped and bovied as required. Hemostat was used to bluntly dissect over to the extensor tendon approaching second toe. Extensor hood release was performed and the tendon was retracted laterally. Incision made through the Periosteal Tissue of the Second Metatarsal and Metatarsophalangeal Joint. This Was [employer]	[place]. A  Dorsal and Medial Capsulotomy Was Performed. Lateral Capsulotomy Was Performed in Order Be Tightened up and Later Time. Osteotomy Was Then Performed through the Second Metatarsal from Dorsal Distal Plantar Proximal Starting at the Osteochondral Junction. Head of Metatarsals and Shortened and Moved to a More Medial Position and Fixated Temporarily with K Wires. There Is Checked FluoroScan Good Position and Correction Were Noted. 2, 2.2 Headless Screws from [place] to Fixate the Osteotomy There Is Checked FluoroScan Good Position and Correction Were Noted.  Procedure #2 at this time a similar procedures performed for the third metatarsal.  Procedure #3. At this time attention was directed to the second toe of the right foot where 2 similar incisions were made over the PIP joint. This was skin was then removed extension identified and incised transversely reflected proximally. Tendon off of the base of proximal phalanx was also resected distally. The articular cartilage was removed from both areas. A 0.4 K wire was then drilled through the middle distal phalanges out the end of the toe and then retrograded in the proximal phalanx. Care was taken to remove the tendon from flapping in between the 2 fusion sites. Once the pin was in good position and the toe was held in a rectus plantarflexed and lateral position and the K wire was drilled through the tarsophalangeal joint at the shaft of the second metatarsal. There is checked FluoroScan good position correction were noted.  Procedure #4. This time attention directed third toe where a similar procedure was performed  At this time all  areas were copiously irrigated and the extensor tendons over the toes were closed with 3-0 Vicryl simple interrupted sutures. 3 separate sutures were performed. Periosteal Tissue overlying each of the metatarsophalangeal joints were closed with a continuous stitch with 4-0 Vicryl. Deep superficial fascial  layers and closed with 4 Vicryl in continuous stitch skin closed with 4 Vicryl in a subcuticular fashion. After   the Marcaine block a sterile compressive dressings in placed across the right foot consisting of Steri-Strips Xeroform gauze 4 x 4's Kling Kerlix and tourniquet was released and prompt complete vascularity seen to return all digits of the right foot. A posterior splint is placed on the right foot leg in the operating room.   Patient tolerated the procedure and anesthesia well.  Was transported from the OR to the PACU with all vital signs stable and vascular status intact. To be discharged per routine protocol.  Will follow up in approximately 1 week in the outpatient clinic.

## 2016-08-06 NOTE — Transfer of Care (Signed)
Immediate Anesthesia Transfer of Care Note  Patient: Joanna Rodriguez  Procedure(s) Performed: Procedure(s) with comments: HAMMER TOE CORRECTION  x's 2 (T6 & T7) (Right) - LMA with local Special Needs:  [place] Needs to be 2nd patient per office WEIL right 2nd & 3rd (Right)  Patient Location: PACU  Anesthesia Type: MAC  Level of Consciousness: awake, alert  and patient cooperative  Airway and Oxygen Therapy: Patient Spontanous Breathing and Patient connected to supplemental oxygen  Post-op Assessment: Post-op Vital signs reviewed, Patient's Cardiovascular Status Stable, Respiratory Function Stable, Patent Airway and No signs of Nausea or vomiting  Post-op Vital Signs: Reviewed and stable  Complications: No apparent anesthesia complications

## 2016-08-07 ENCOUNTER — Encounter: Payer: Self-pay | Admitting: Podiatry

## 2016-08-12 DIAGNOSIS — M2041 Other hammer toe(s) (acquired), right foot: Secondary | ICD-10-CM | POA: Diagnosis not present

## 2016-08-19 DIAGNOSIS — M216X1 Other acquired deformities of right foot: Secondary | ICD-10-CM | POA: Diagnosis not present

## 2016-08-19 DIAGNOSIS — M2041 Other hammer toe(s) (acquired), right foot: Secondary | ICD-10-CM | POA: Diagnosis not present

## 2016-08-21 ENCOUNTER — Encounter: Payer: Self-pay | Admitting: Pulmonary Disease

## 2016-08-21 ENCOUNTER — Institutional Professional Consult (permissible substitution): Payer: Commercial Managed Care - HMO | Admitting: Internal Medicine

## 2016-08-21 ENCOUNTER — Ambulatory Visit (INDEPENDENT_AMBULATORY_CARE_PROVIDER_SITE_OTHER): Payer: Medicare HMO | Admitting: Pulmonary Disease

## 2016-08-21 VITALS — BP 130/82 | HR 96 | Wt 148.0 lb

## 2016-08-21 DIAGNOSIS — Z87891 Personal history of nicotine dependence: Secondary | ICD-10-CM | POA: Diagnosis not present

## 2016-08-21 DIAGNOSIS — J449 Chronic obstructive pulmonary disease, unspecified: Secondary | ICD-10-CM

## 2016-08-21 DIAGNOSIS — J9383 Other pneumothorax: Secondary | ICD-10-CM | POA: Diagnosis not present

## 2016-08-21 NOTE — Patient Instructions (Signed)
Continue Spiriva for now  After you recover fully from your R foot surgery, you may try on and off the Spiriva if you wish to discern whether it is helping your breathing or not  Low dose (screening) CT scan of chest ordered for April of this year  Follow up in 3-4 months at which time we will discuss possible enrollment in [facility]

## 2016-08-22 NOTE — Progress Notes (Signed)
PULMONARY OFFICE FOLLOW UP NOTE  PROBLEMS:  Former smoker Moderate COPD History of spontaneous left PTX X 2 - s/p L pleurodesis   DATA: PFTs 10/23/14: moderate obstruction, FEV1 1.30 L (58% pred), normal lung volumes, normal DLCO LDCT 10/25/15: moderate emphysema, sequelae from prior pleurodesis, no suspicious nodules  INTERVAL HISTORY: Previous pt of VM last seen 05/27/16. She has recently undergone surgery on her R foot for a hammer toe.   SUBJ: This is a routine follow up originally scheduled to be with [facility]. She has no new complaints. She is maintained on Spiriva with no use of a rescue inhaler. She has class II dyspnea at baseline. Presently more limited by her foot (she remains in a boot on her R foot). She is unable to say with certainty that Spiriva makes any difference. Denies CP, fever, purulent sputum, hemoptysis, LE edema and calf tenderness.  OBJ: Vitals:   08/21/16 1045  BP: 130/82  Pulse: 96  SpO2: 99%  Weight: 148 lb (67.1 kg)    Gen: WDWN in NAD HEENT: All WNL Neck: NO LAN, no JVD noted Lungs: BS mildly diminished, no wheezes Cardiovascular: Reg rhythm, normal rate, no M noted Abdomen: Soft, NT +BS Ext: no C/C/E, R foot in surgical boot Neuro: CNs intact, motor/sens grossly intact Skin: No lesions noted  DATA: BMP Latest Ref Rng & Units 07/27/2016 04/29/2016 03/03/2016  Glucose 65 - 99 mg/dL 81 101(H) 98  BUN 8 - 27 mg/dL 16 15 18   Creatinine 0.57 - 1.00 mg/dL 0.74 0.62 0.76  BUN/Creat Ratio 12 - 28 22 24 24   Sodium 134 - 144 mmol/L 145(H) 143 143  Potassium 3.5 - 5.2 mmol/L 5.1 4.8 5.1  Chloride 96 - 106 mmol/L 105 103 102  CO2 18 - 29 mmol/L 26 27 25   Calcium 8.7 - 10.3 mg/dL 9.7 9.5 9.4   CBC Latest Ref Rng & Units 07/27/2016 04/29/2016 03/03/2016  WBC 3.4 - 10.8 x10E3/uL 5.1 5.6 4.5  Hemoglobin 12.0 - 16.0 g/dL - - -  Hematocrit 34.0 - 46.6 % 35.8 36.3 37.7  Platelets 150 - 379 x10E3/uL 392(H) 437(H) 396(H)   No recent CXR  IMPRESSION: Former  smoker - Plan: CT CHEST LUNG CA SCREEN LOW DOSE W/O CM  History of L spontaneous pneumothorax X 2, S/P talc pleurodesis  COPD, moderate (HCC)  PLAN: Continue Spiriva for now  After she recovers fully from R foot surgery, may try on and off the Spiriva to discern whether it is helping or not  Low dose (screening) CT scan of chest ordered for April of this year  Follow up in 3-4 months at which time we will discuss possible enrollment in [facility]   Merton Border, MD PCCM service Mobile [phone] Pager [phone] 08/22/2016

## 2016-08-24 ENCOUNTER — Encounter: Payer: Self-pay | Admitting: Family Medicine

## 2016-08-24 ENCOUNTER — Other Ambulatory Visit: Payer: Self-pay | Admitting: Gastroenterology

## 2016-08-24 ENCOUNTER — Other Ambulatory Visit: Payer: Self-pay | Admitting: Family Medicine

## 2016-08-24 ENCOUNTER — Ambulatory Visit (INDEPENDENT_AMBULATORY_CARE_PROVIDER_SITE_OTHER): Payer: Commercial Managed Care - HMO | Admitting: Family Medicine

## 2016-08-24 DIAGNOSIS — F17201 Nicotine dependence, unspecified, in remission: Secondary | ICD-10-CM | POA: Diagnosis not present

## 2016-08-24 NOTE — Progress Notes (Signed)
BP 108/60 (BP Location: Left Arm, Patient Position: Sitting, Cuff Size: Normal)   Pulse 85   Temp 98.7 F (37.1 C) (Oral)   Resp 17   Ht 4' 11"  (1.499 m)   Wt 148 lb (67.1 kg)   SpO2 97%   BMI 29.89 kg/m    Subjective:    Patient ID: Joanna Rodriguez, female    DOB: 09-08-1950, 66 y.o.   MRN: [id number]  HPI: Joanna Rodriguez is a 66 y.o. female  Chief Complaint  Patient presents with  . Nicotine Dependence   SMOKING CESSATION- had been having a lot of cravings to smoke at last visit, started on wellbutrin Smoking Status: Former Smoker Smoking Amount: None Type of tobacco use: cigarettes Children in the house: no Other household members who smoke: no Pneumovax: UTD  She feels like the cravings have decreased. Her mood is doing better. Feeling pretty well. No side effects from the medicine. Otherwise feeling well with no other concerns or complaints.   Relevant past medical, surgical, family and social history reviewed and updated as indicated. Interim medical history since our last visit reviewed. Allergies and medications reviewed and updated.  Review of Systems  Constitutional: Negative.   Respiratory: Negative.   Cardiovascular: Negative.   Psychiatric/Behavioral: Negative.     Per HPI unless specifically indicated above     Objective:    BP 108/60 (BP Location: Left Arm, Patient Position: Sitting, Cuff Size: Normal)   Pulse 85   Temp 98.7 F (37.1 C) (Oral)   Resp 17   Ht 4' 11"  (1.499 m)   Wt 148 lb (67.1 kg)   SpO2 97%   BMI 29.89 kg/m   Wt Readings from Last 3 Encounters:  08/24/16 148 lb (67.1 kg)  08/21/16 148 lb (67.1 kg)  08/06/16 143 lb (64.9 kg)    Physical Exam  Constitutional: She is oriented to person, place, and time. She appears well-developed and well-nourished. No distress.  HENT:  Head: Normocephalic and atraumatic.  Right Ear: Hearing normal.  Left Ear: Hearing normal.  Nose: Nose normal.  Eyes: Conjunctivae and  lids are normal. Right eye exhibits no discharge. Left eye exhibits no discharge. No scleral icterus.  Cardiovascular: Normal rate, regular rhythm, normal heart sounds and intact distal pulses.  Exam reveals no gallop and no friction rub.   No murmur heard. Pulmonary/Chest: Effort normal and breath sounds normal. No respiratory distress. She has no wheezes. She has no rales. She exhibits no tenderness.  Musculoskeletal: Normal range of motion.  Neurological: She is alert and oriented to person, place, and time.  Skin: Skin is warm, dry and intact. No rash noted. No erythema. No pallor.  Psychiatric: She has a normal mood and affect. Her speech is normal and behavior is normal. Judgment and thought content normal. Cognition and memory are normal.  Nursing note and vitals reviewed.   Results for orders placed or performed in visit on 07/27/16  TSH  Result Value Ref Range   TSH 1.360 0.450 - 4.500 uIU/mL  Comprehensive metabolic panel  Result Value Ref Range   Glucose 81 65 - 99 mg/dL   BUN 16 8 - 27 mg/dL   Creatinine, Ser 0.74 0.57 - 1.00 mg/dL   GFR calc non Af Amer 84 >59 mL/min/1.73   GFR calc Af Amer 97 >59 mL/min/1.73   BUN/Creatinine Ratio 22 12 - 28   Sodium 145 (H) 134 - 144 mmol/L   Potassium 5.1 3.5 - 5.2 mmol/L  Chloride 105 96 - 106 mmol/L   CO2 26 18 - 29 mmol/L   Calcium 9.7 8.7 - 10.3 mg/dL   Total Protein 6.4 6.0 - 8.5 g/dL   Albumin 3.6 3.6 - 4.8 g/dL   Globulin, Total 2.8 1.5 - 4.5 g/dL   Albumin/Globulin Ratio 1.3 1.2 - 2.2   Bilirubin Total 0.4 0.0 - 1.2 mg/dL   Alkaline Phosphatase 58 39 - 117 IU/L   AST 40 0 - 40 IU/L   ALT 51 (H) 0 - 32 IU/L  CBC with Differential/Platelet  Result Value Ref Range   WBC 5.1 3.4 - 10.8 x10E3/uL   RBC 3.66 (L) 3.77 - 5.28 x10E6/uL   Hemoglobin 12.3 11.1 - 15.9 g/dL   Hematocrit 35.8 34.0 - 46.6 %   MCV 98 (H) 79 - 97 fL   MCH 33.6 (H) 26.6 - 33.0 pg   MCHC 34.4 31.5 - 35.7 g/dL   RDW 15.0 12.3 - 15.4 %   Platelets 392  (H) 150 - 379 x10E3/uL   Neutrophils 55 Not Estab. %   Lymphs 35 Not Estab. %   Monocytes 8 Not Estab. %   Eos 1 Not Estab. %   Basos 1 Not Estab. %   Neutrophils Absolute 2.8 1.4 - 7.0 x10E3/uL   Lymphocytes Absolute 1.8 0.7 - 3.1 x10E3/uL   Monocytes Absolute 0.4 0.1 - 0.9 x10E3/uL   EOS (ABSOLUTE) 0.1 0.0 - 0.4 x10E3/uL   Basophils Absolute 0.1 0.0 - 0.2 x10E3/uL   Immature Granulocytes 0 Not Estab. %   Immature Grans (Abs) 0.0 0.0 - 0.1 x10E3/uL      Assessment & Plan:   Problem List Items Addressed This Visit      Other   Tobacco abuse, in remission    Stable without cravings on the wellbutrin. Tolerating it well. Call with any concerns. Refills given today. Recheck 6 months.           Follow up plan: Return in about 6 months (around 02/21/2017).

## 2016-08-24 NOTE — Assessment & Plan Note (Addendum)
Stable without cravings on the wellbutrin. Tolerating it well. Call with any concerns. Refills given today. Recheck 6 months.

## 2016-09-01 ENCOUNTER — Telehealth: Payer: Self-pay | Admitting: Pulmonary Disease

## 2016-09-01 NOTE — Telephone Encounter (Signed)
Pt informed scan not due until end of may and f/u in June. Pt verbalized understanding. Nothing further needed.

## 2016-09-01 NOTE — Telephone Encounter (Signed)
Pt calling stating she would like to know about [place] scheduling her lung scan and doing a follow up She is going out of town 11/04/16-11/10/16  Would like to not schedule anything on those days

## 2016-09-02 DIAGNOSIS — M79671 Pain in right foot: Secondary | ICD-10-CM | POA: Diagnosis not present

## 2016-09-08 ENCOUNTER — Telehealth: Payer: Self-pay | Admitting: *Deleted

## 2016-09-08 DIAGNOSIS — Z87891 Personal history of nicotine dependence: Secondary | ICD-10-CM

## 2016-09-08 NOTE — Telephone Encounter (Signed)
Notified patient that annual lung cancer screening low dose CT scan is due. Confirmed that patient is within the age range of 55-77, and asymptomatic, (no signs or symptoms of lung cancer). Patient denies illness that would prevent curative treatment for lung cancer if found. The patient is a former smoker, quit 2016, 53 pack year.. The shared decision making visit was done 10/25/15. Patient is agreeable for CT scan being scheduled.

## 2016-09-09 ENCOUNTER — Telehealth: Payer: Self-pay

## 2016-09-09 NOTE — Telephone Encounter (Signed)
LVM CT scheduled 4/30 @ 1pm with 12:45 arrival. Please call [phone] with any questions about appt time.

## 2016-09-16 DIAGNOSIS — M2041 Other hammer toe(s) (acquired), right foot: Secondary | ICD-10-CM | POA: Diagnosis not present

## 2016-09-23 DIAGNOSIS — Z0100 Encounter for examination of eyes and vision without abnormal findings: Secondary | ICD-10-CM | POA: Diagnosis not present

## 2016-09-23 DIAGNOSIS — H519 Unspecified disorder of binocular movement: Secondary | ICD-10-CM | POA: Diagnosis not present

## 2016-09-23 DIAGNOSIS — H2513 Age-related nuclear cataract, bilateral: Secondary | ICD-10-CM | POA: Diagnosis not present

## 2016-09-30 DIAGNOSIS — M2041 Other hammer toe(s) (acquired), right foot: Secondary | ICD-10-CM | POA: Diagnosis not present

## 2016-10-26 ENCOUNTER — Ambulatory Visit
Admission: RE | Admit: 2016-10-26 | Discharge: 2016-10-26 | Disposition: A | Discharge status: Home / Self Care | Payer: Medicare HMO | Source: Ambulatory Visit | Attending: Oncology | Admitting: Oncology

## 2016-10-26 DIAGNOSIS — J432 Centrilobular emphysema: Secondary | ICD-10-CM | POA: Diagnosis not present

## 2016-10-26 DIAGNOSIS — I2510 Atherosclerotic heart disease of native coronary artery without angina pectoris: Secondary | ICD-10-CM | POA: Insufficient documentation

## 2016-10-26 DIAGNOSIS — I700 Atherosclerosis of aorta: Secondary | ICD-10-CM | POA: Diagnosis not present

## 2016-10-26 DIAGNOSIS — R918 Other nonspecific abnormal finding of lung field: Secondary | ICD-10-CM | POA: Diagnosis not present

## 2016-10-26 DIAGNOSIS — Z87891 Personal history of nicotine dependence: Secondary | ICD-10-CM | POA: Diagnosis not present

## 2016-10-26 DIAGNOSIS — Z122 Encounter for screening for malignant neoplasm of respiratory organs: Secondary | ICD-10-CM | POA: Insufficient documentation

## 2016-10-26 DIAGNOSIS — K449 Diaphragmatic hernia without obstruction or gangrene: Secondary | ICD-10-CM | POA: Insufficient documentation

## 2016-10-27 ENCOUNTER — Ambulatory Visit (INDEPENDENT_AMBULATORY_CARE_PROVIDER_SITE_OTHER): Payer: Medicare HMO | Admitting: Family Medicine

## 2016-10-27 ENCOUNTER — Encounter: Payer: Self-pay | Admitting: Family Medicine

## 2016-10-27 VITALS — BP 122/70 | HR 75 | Temp 98.4°F | Ht 59.6 in | Wt 144.0 lb

## 2016-10-27 DIAGNOSIS — K2970 Gastritis, unspecified, without bleeding: Secondary | ICD-10-CM

## 2016-10-27 DIAGNOSIS — K50119 Crohn's disease of large intestine with unspecified complications: Secondary | ICD-10-CM

## 2016-10-27 DIAGNOSIS — J432 Centrilobular emphysema: Secondary | ICD-10-CM

## 2016-10-27 DIAGNOSIS — H9311 Tinnitus, right ear: Secondary | ICD-10-CM | POA: Diagnosis not present

## 2016-10-27 DIAGNOSIS — I2510 Atherosclerotic heart disease of native coronary artery without angina pectoris: Secondary | ICD-10-CM | POA: Diagnosis not present

## 2016-10-27 DIAGNOSIS — J9383 Other pneumothorax: Secondary | ICD-10-CM

## 2016-10-27 DIAGNOSIS — Z1231 Encounter for screening mammogram for malignant neoplasm of breast: Secondary | ICD-10-CM

## 2016-10-27 DIAGNOSIS — Z1239 Encounter for other screening for malignant neoplasm of breast: Secondary | ICD-10-CM

## 2016-10-27 DIAGNOSIS — Z0000 Encounter for general adult medical examination without abnormal findings: Secondary | ICD-10-CM

## 2016-10-27 DIAGNOSIS — E039 Hypothyroidism, unspecified: Secondary | ICD-10-CM

## 2016-10-27 DIAGNOSIS — I10 Essential (primary) hypertension: Secondary | ICD-10-CM | POA: Diagnosis not present

## 2016-10-27 DIAGNOSIS — E538 Deficiency of other specified B group vitamins: Secondary | ICD-10-CM

## 2016-10-27 LAB — MICROSCOPIC EXAMINATION: Bacteria, UA: NONE SEEN

## 2016-10-27 LAB — UA/M W/RFLX CULTURE, ROUTINE
BILIRUBIN UA: NEGATIVE
GLUCOSE, UA: NEGATIVE
Nitrite, UA: NEGATIVE
RBC UA: NEGATIVE
SPEC GRAV UA: 1.025 (ref 1.005–1.030)
UUROB: 0.2 mg/dL (ref 0.2–1.0)
pH, UA: 5.5 (ref 5.0–7.5)

## 2016-10-27 LAB — MICROALBUMIN, URINE WAIVED
Creatinine, Urine Waived: 300 mg/dL (ref 10–300)
Microalb, Ur Waived: 80 mg/L — ABNORMAL HIGH (ref 0–19)

## 2016-10-27 MED ORDER — LISINOPRIL 5 MG PO TABS: 5.0000 mg | ORAL_TABLET | Freq: Every day | ORAL | 1 refills | Status: DC

## 2016-10-27 MED ORDER — ACYCLOVIR 400 MG PO TABS: 400.0000 mg | ORAL_TABLET | Freq: Every day | ORAL | 3 refills | Status: DC

## 2016-10-27 MED ORDER — BUPROPION HCL ER (SR) 150 MG PO TB12: ORAL_TABLET | ORAL | 3 refills | Status: DC

## 2016-10-27 NOTE — Assessment & Plan Note (Signed)
Continue to follow with GI. Call with any concerns. Stable. Continue to monitor.

## 2016-10-27 NOTE — Progress Notes (Signed)
BP 122/70 (BP Location: Left Arm, Cuff Size: Normal)   Pulse 75   Temp 98.4 F (36.9 C)   Ht 4' 11.6" (1.514 m)   Wt 144 lb (65.3 kg)   SpO2 95%   BMI 28.50 kg/m    Subjective:    Patient ID: Joanna Rodriguez, female    DOB: [birth year], 66 y.o.   MRN: [id number]  HPI: Joanna Rodriguez is a 66 y.o. female presenting on 10/27/2016 for comprehensive medical examination. Current medical complaints include:  HYPERTENSION Hypertension status: stable  Satisfied with current treatment? yes Duration of hypertension: chronic BP monitoring frequency:  not checking BP medication side effects:  no Medication compliance: excellent compliance Previous BP meds: lisinopril Aspirin: yes Recurrent headaches: no Visual changes: no Palpitations: yes- with exercise Dyspnea: no Chest pain: no Lower extremity edema: no Dizzy/lightheaded: no  HYPOTHYROIDISM Thyroid control status:controlled Satisfied with current treatment? yes Medication side effects: no Medication compliance: excellent compliance Recent dose adjustment:no Fatigue: no Cold intolerance: no Heat intolerance: no Weight gain: no Weight loss: no Constipation: no Diarrhea/loose stools: no Palpitations: yes Lower extremity edema: no Anxiety/depressed mood: no  Menopausal Symptoms: no  Functional Status Survey: Is the patient deaf or have difficulty hearing?: Yes (Ringing in right ear) Does the patient have difficulty seeing, even when wearing glasses/contacts?: No Does the patient have difficulty concentrating, remembering, or making decisions?: No Does the patient have difficulty walking or climbing stairs?: No Does the patient have difficulty dressing or bathing?: No Does the patient have difficulty doing errands alone such as visiting a doctor's office or shopping?: No  Fall Risk  10/27/2016 04/22/2016 04/09/2016 10/28/2015 06/25/2015  Falls in the past year? No No No No No    Depression Screen Depression  screen [facility] 2/9 10/27/2016 08/24/2016 04/22/2016 04/09/2016 10/28/2015  Decreased Interest 0 0 0 0 0  Down, Depressed, Hopeless 0 0 0 0 1  PHQ - 2 Score 0 0 0 0 1    Advanced Directives Does patient have a HCPOA?    yes Does patient have a living will or MOST form?  yes  Past Medical History:  Past Medical History:  Diagnosis Date  . Arthritis   . Cancer [facility])    face  . COPD (chronic obstructive pulmonary disease) ([place])   . Crohn's disease ([place])   . Dyspnea on exertion   . Genital herpes   . Hypertension   . Hypothyroid   . Kidney stones   . Osteopenia   . Personal history of tobacco use, presenting hazards to health 10/23/2015  . Scoliosis   . Spontaneous pneumothorax   . Vertigo    last episode 11/14/15  . Volvulus [facility])     Surgical History:  Past Surgical History:  Procedure Laterality Date  . ABDOMINAL HYSTERECTOMY    . APPENDECTOMY    . BREAST EXCISIONAL BIOPSY Bilateral    -  both in 1990's  . CAPSULOTOMY METATARSOPHALANGEAL Left 03/19/2016   Procedure: CAPSULOTOMY METATARSOPHALANGEAL;  Surgeon: Albertine Patricia, DPM;  Location: [place];  Service: Podiatry;  Laterality: Left;  Second left toe.  . ESOPHAGOGASTRODUODENOSCOPY (EGD) WITH PROPOFOL N/A 11/21/2015   Procedure: ESOPHAGOGASTRODUODENOSCOPY (EGD) WITH PROPOFOL with dialation;  Surgeon: Lucilla Lame, MD;  Location: [place];  Service: Endoscopy;  Laterality: N/A;  . FLEXOR TENDON REPAIR Left 03/19/2016   Procedure: FLEXOR TENDON release, percutaneous.;  Surgeon: Albertine Patricia, DPM;  Location: [place];  Service: Podiatry;  Laterality: Left;  4th left toe.  Marland Kitchen  HAMMER TOE SURGERY Left 03/19/2016   Procedure: HAMMER TOE CORRECTION with PIPjoint fusion;  Surgeon: Albertine Patricia, DPM;  Location: [place];  Service: Podiatry;  Laterality: Left;  Third left toe.  Marland Kitchen HAMMER TOE SURGERY Right 08/06/2016   Procedure: HAMMER TOE CORRECTION  RIGHT 2ND AND 3RD;  Surgeon: Albertine Patricia,  DPM;  Location: [place];  Service: Podiatry;  Laterality: Right;  LMA with local Special Needs:  [employer] Needs to be 2nd patient per office  . TALC PLEURODESIS  07/2014   left side  . THORACOSCOPY  07/2014   left side  . TUBAL LIGATION    . VOLVULUS REDUCTION    . WEIL OSTEOTOMY Right 08/06/2016   Procedure: WEIL right 2nd & 3rd;  Surgeon: Albertine Patricia, DPM;  Location: [place];  Service: Podiatry;  Laterality: Right;    Medications:  Current Outpatient Prescriptions on File Prior to Visit  Medication Sig  . Alum & Mag Hydroxide-Simeth (ANTACID ANTI-GAS MAX STRENGTH PO) Take 1 each by mouth daily. Reported on 11/21/2015  . aspirin EC 81 MG tablet Take 81 mg by mouth daily.  . B Complex-Folic Acid (B COMPLEX-VITAMIN B12 PO) Take 1 tablet by mouth daily.  Marland Kitchen BLACK COHOSH PO Take by mouth daily.  . Calcium Carb-Cholecalciferol 600-800 MG-UNIT TABS Take by mouth 2 (two) times daily.  Marland Kitchen Co-Enzyme Q-10 30 MG CAPS Take 1 capsule by mouth daily.  . Glucosamine-Chondroit-Vit C-Mn (SM GLUCOSAMINE/CHONDROITIN PO) Take 1,500 mg by mouth 2 (two) times daily.  Marland Kitchen levothyroxine (SYNTHROID, LEVOTHROID) 100 MCG tablet TAKE 1 TABLET BY MOUTH DAILY   . Melatonin 5 MG TABS Take 5 mg by mouth at bedtime as needed.  . mercaptopurine (PURINETHOL) 50 MG tablet TAKE 1 AND 1/2 TABLETS EVERY DAY  . Multiple Vitamins-Minerals (MULTIVITAMIN WITH MINERALS) tablet Take 1 tablet by mouth daily.  . Omega-3 Fatty Acids (FISH OIL PO) Take 1,200 mg by mouth 2 (two) times daily.  . pantoprazole (PROTONIX) 40 MG tablet TAKE 1 TABLET EVERY DAY  . Tiotropium Bromide Monohydrate (SPIRIVA RESPIMAT) 2.5 MCG/ACT AERS Inhale 2 puffs into the lungs daily. Gargle and rinse after each use  . traMADol (ULTRAM) 50 MG tablet TAKE 1 TABLET THREE TIMES DAILY   No current facility-administered medications on file prior to visit.     Allergies:  Allergies  Allergen Reactions  . Nsaids     Other  reaction(s): Other (See Comments) Caused Crohn's flare up.    Social History:  Social History   Social History  . Marital status: Divorced    Spouse name: N/A  . Number of children: N/A  . Years of education: N/A   Occupational History  . 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 Smoking status: Former Smoker    Packs/day: 1.00    Years: 53.00    Types: Cigarettes    Quit date: 06/29/2014  . Smokeless tobacco: Never Used     Comment: NOV 16,2015  . Alcohol use 0.0 oz/week     Comment: occasional - 2-3x/yr  . Drug use: No  . Sexual activity: No   Other Topics Concern  . Not on file   Social History Narrative  . No narrative on file   History  Smoking Status  . Former Smoker  . Packs/day: 1.00  . Years: 53.00  . Types: Cigarettes  . Quit date: 06/29/2014  Smokeless Tobacco  . Never  Used    Comment: NOV 16,2015   History  Alcohol Use  . 0.0 oz/week    Comment: occasional - 2-3x/yr    Family History:  Family History  Problem Relation Age of Onset  . Hypertension Mother   . Hypothyroidism Mother   . Alcohol abuse Mother   . Breast cancer Mother 75  . Hyperlipidemia Mother   . Mental illness Mother   . Heart attack Father   . Heart disease Father   . Glaucoma Father   . Colon cancer Father 54  . Diabetes Father   . Hypertension Father   . Heart disease Sister   . Hyperlipidemia Sister   . Hypertension Sister   . Lung disease Sister     Past medical history, surgical history, medications, allergies, family history and social history reviewed with patient today and changes made to appropriate areas of the chart.   Review of Systems  Constitutional: Negative.   HENT: Positive for hearing loss and tinnitus. Negative for congestion, ear discharge, ear pain, nosebleeds, sinus pain and sore throat.   Eyes: Negative.   Respiratory: Positive for cough, shortness of breath and wheezing. Negative for hemoptysis, sputum  production and stridor.   Cardiovascular: Positive for palpitations. Negative for chest pain, orthopnea, claudication, leg swelling and PND.  Gastrointestinal: Positive for heartburn. Negative for abdominal pain, blood in stool, constipation, diarrhea, melena, nausea and vomiting.  Genitourinary: Negative.        Slow stream   Musculoskeletal: Negative.   Skin: Negative.   Neurological: Negative.   Endo/Heme/Allergies: Positive for environmental allergies. Negative for polydipsia. Does not bruise/bleed easily.  Psychiatric/Behavioral: Negative.     All other ROS negative except what is listed above and in the HPI.      Objective:    BP 122/70 (BP Location: Left Arm, Cuff Size: Normal)   Pulse 75   Temp 98.4 F (36.9 C)   Ht 4' 11.6" (1.514 m)   Wt 144 lb (65.3 kg)   SpO2 95%   BMI 28.50 kg/m   Wt Readings from Last 3 Encounters:  10/27/16 144 lb (65.3 kg)  10/26/16 143 lb (64.9 kg)  08/24/16 148 lb (67.1 kg)   No exam data present  Physical Exam  Constitutional: She is oriented to person, place, and time. She appears well-developed and well-nourished. No distress.  HENT:  Head: Normocephalic and atraumatic.  Right Ear: Hearing, tympanic membrane, external ear and ear canal normal.  Left Ear: Hearing, tympanic membrane, external ear and ear canal normal.  Nose: Nose normal.  Mouth/Throat: Uvula is midline, oropharynx is clear and moist and mucous membranes are normal. No oropharyngeal exudate.  Eyes: Conjunctivae, EOM and lids are normal. Pupils are equal, round, and reactive to light. Right eye exhibits no discharge. Left eye exhibits no discharge. No scleral icterus.  Neck: Normal range of motion. Neck supple. No JVD present. No tracheal deviation present. No thyromegaly present.  Cardiovascular: Normal rate, regular rhythm, normal heart sounds and intact distal pulses.  Exam reveals no gallop and no friction rub.   No murmur heard. Pulmonary/Chest: Effort normal and  breath sounds normal. No stridor. No respiratory distress. She has no wheezes. She has no rales. She exhibits no tenderness.  Abdominal: Soft. Bowel sounds are normal. She exhibits no distension and no mass. There is no tenderness. There is no rebound and no guarding.  Genitourinary:  Genitourinary Comments: Breast and pelvic exams deferred with shared decision making  Musculoskeletal: Normal range of motion.  She exhibits no edema, tenderness or deformity.  Lymphadenopathy:    She has no cervical adenopathy.  Neurological: She is alert and oriented to person, place, and time. She has normal reflexes. She displays normal reflexes. No cranial nerve deficit. She exhibits normal muscle tone. Coordination normal.  Skin: Skin is warm, dry and intact. No rash noted. She is not diaphoretic. No erythema. No pallor.  Psychiatric: She has a normal mood and affect. Her speech is normal and behavior is normal. Judgment and thought content normal. Cognition and memory are normal.  Nursing note and vitals reviewed.   6CIT Screen 10/27/2016  What Year? 0 points  What month? 0 points  What time? 0 points  Count back from 20 0 points  Months in reverse 0 points  Repeat phrase 0 points  Total Score 0     Results for orders placed or performed in visit on 07/27/16  TSH  Result Value Ref Range   TSH 1.360 0.450 - 4.500 uIU/mL  Comprehensive metabolic panel  Result Value Ref Range   Glucose 81 65 - 99 mg/dL   BUN 16 8 - 27 mg/dL   Creatinine, Ser 0.74 0.57 - 1.00 mg/dL   GFR calc non Af Amer 84 >59 mL/min/1.73   GFR calc Af Amer 97 >59 mL/min/1.73   BUN/Creatinine Ratio 22 12 - 28   Sodium 145 (H) 134 - 144 mmol/L   Potassium 5.1 3.5 - 5.2 mmol/L   Chloride 105 96 - 106 mmol/L   CO2 26 18 - 29 mmol/L   Calcium 9.7 8.7 - 10.3 mg/dL   Total Protein 6.4 6.0 - 8.5 g/dL   Albumin 3.6 3.6 - 4.8 g/dL   Globulin, Total 2.8 1.5 - 4.5 g/dL   Albumin/Globulin Ratio 1.3 1.2 - 2.2   Bilirubin Total 0.4 0.0 -  1.2 mg/dL   Alkaline Phosphatase 58 39 - 117 IU/L   AST 40 0 - 40 IU/L   ALT 51 (H) 0 - 32 IU/L  CBC with Differential/Platelet  Result Value Ref Range   WBC 5.1 3.4 - 10.8 x10E3/uL   RBC 3.66 (L) 3.77 - 5.28 x10E6/uL   Hemoglobin 12.3 11.1 - 15.9 g/dL   Hematocrit 35.8 34.0 - 46.6 %   MCV 98 (H) 79 - 97 fL   MCH 33.6 (H) 26.6 - 33.0 pg   MCHC 34.4 31.5 - 35.7 g/dL   RDW 15.0 12.3 - 15.4 %   Platelets 392 (H) 150 - 379 x10E3/uL   Neutrophils 55 Not Estab. %   Lymphs 35 Not Estab. %   Monocytes 8 Not Estab. %   Eos 1 Not Estab. %   Basos 1 Not Estab. %   Neutrophils Absolute 2.8 1.4 - 7.0 x10E3/uL   Lymphocytes Absolute 1.8 0.7 - 3.1 x10E3/uL   Monocytes Absolute 0.4 0.1 - 0.9 x10E3/uL   EOS (ABSOLUTE) 0.1 0.0 - 0.4 x10E3/uL   Basophils Absolute 0.1 0.0 - 0.2 x10E3/uL   Immature Granulocytes 0 Not Estab. %   Immature Grans (Abs) 0.0 0.0 - 0.1 x10E3/uL      Assessment & Plan:   Problem List Items Addressed This Visit      Cardiovascular and Mediastinum   HTN (hypertension)    Under good control on recheck. Continue current regimen. Continue to monitor. Call with any concerns.       Relevant Medications   lisinopril (PRINIVIL,ZESTRIL) 5 MG tablet   Other Relevant Orders   Comprehensive metabolic panel   Microalbumin,  Urine Waived   UA/M w/rflx Culture, Routine   CAD (coronary artery disease)    Continues to follow with cardiology. Call with any concerns. Continue current regimen. Continue to monitor.       Relevant Medications   lisinopril (PRINIVIL,ZESTRIL) 5 MG tablet   Other Relevant Orders   Comprehensive metabolic panel   Lipid Panel w/o Chol/HDL Ratio   UA/M w/rflx Culture, Routine     Respiratory   COPD (chronic obstructive pulmonary disease) ([place])    Continue to follow with pulmonology. Referral put in today so she can continue to see them. Call with any concerns.       Relevant Orders   Comprehensive metabolic panel   UA/M w/rflx Culture, Routine    Ambulatory referral to Pulmonology   Spontaneous pneumothorax    Continue to follow with pulmonology. Referral put in today so she can continue to see them. Call with any concerns.       Relevant Orders   Ambulatory referral to Pulmonology     Digestive   CC (Crohn's colitis) ([place])    Continue to follow with GI. Call with any concerns. Stable. Continue to monitor.       Gastritis    Continue to follow with GI. Call with any concerns. Stable. Continue to monitor.       Relevant Orders   CBC with Differential/Platelet   Comprehensive metabolic panel   UA/M w/rflx Culture, Routine     Endocrine   Thyroid activity decreased    Rechecking levels today. Call with any concerns. Will adjust dose as needed.       Relevant Orders   Comprehensive metabolic panel   TSH   UA/M w/rflx Culture, Routine     Other   B12 deficiency    Rechecking levels today. Call with any concerns. Will adjust dose as needed.       Relevant Orders   Comprehensive metabolic panel   UA/M w/rflx Culture, Routine   B12 and Folate Panel    Other Visit Diagnoses    Medicare annual wellness visit, subsequent    -  Primary   Preventative care discussed today as below. Call with any concerns.    Tinnitus of right ear       Would like to see ENT- referral generated today. Likely due to high frequency hearing loss due to rock concerts in her youth.    Relevant Orders   Ambulatory referral to ENT   Screening for breast cancer       Mammogram ordered today.   Relevant Orders   MM DIGITAL SCREENING BILATERAL       Preventative Services:  Health Risk Assessment and Personalized Prevention Plan: Done today Bone Mass Measurements: Up to date Breast Cancer Screening: Ordered today CVD Screening: Done today Cervical Cancer Screening: N/A Colon Cancer Screening: Up to Date Depression Screening: Up to Date Diabetes Screening: Done today Glaucoma Screening: See your eye doctor Hepatitis B vaccine:  N/A Hepatitis C screening: Up to date HIV Screening: Up to date Flu Vaccine: Up to date Lung cancer Screening: Up to date Obesity Screening: Done today Pneumonia Vaccines (2): Up to date STI Screening: N/A  Follow up plan: Return in about 6 months (around 04/29/2017) for Follow up.   LABORATORY TESTING:  - Pap smear: not applicable  IMMUNIZATIONS:   - Tdap: Tetanus vaccination status reviewed: last tetanus booster within 10 years. - Influenza: Up to date - Pneumovax: Up to date - Prevnar: Up to date -  Zostavax vaccine: Will check with insurance  SCREENING: -Mammogram: Ordered today  - Colonoscopy: Up to date  - Bone Density: Up to date  -Hearing Test: Ordered today  -Spirometry: Done elsewhere   PATIENT COUNSELING:   Advised to take 1 mg of folate supplement per day if capable of pregnancy.   Sexuality: Discussed sexually transmitted diseases, partner selection, use of condoms, avoidance of unintended pregnancy  and contraceptive alternatives.   Advised to avoid cigarette smoking.  I discussed with the patient that most people either abstain from alcohol or drink within safe limits (<=14/week and <=4 drinks/occasion for males, <=7/weeks and <= 3 drinks/occasion for females) and that the risk for alcohol disorders and other health effects rises proportionally with the number of drinks per week and how often a drinker exceeds daily limits.  Discussed cessation/primary prevention of drug use and availability of treatment for abuse.   Diet: Encouraged to adjust caloric intake to maintain  or achieve ideal body weight, to reduce intake of dietary saturated fat and total fat, to limit sodium intake by avoiding high sodium foods and not adding table salt, and to maintain adequate dietary potassium and calcium preferably from fresh fruits, vegetables, and low-fat dairy products.    stressed the importance of regular exercise  Injury prevention: Discussed safety belts, safety  helmets, smoke detector, smoking near bedding or upholstery.   Dental health: Discussed importance of regular tooth brushing, flossing, and dental visits.    NEXT PREVENTATIVE PHYSICAL DUE IN 1 YEAR. Return in about 6 months (around 04/29/2017) for Follow up.

## 2016-10-27 NOTE — Assessment & Plan Note (Signed)
Continue to follow with pulmonology. Referral put in today so she can continue to see them. Call with any concerns.

## 2016-10-27 NOTE — Assessment & Plan Note (Signed)
Rechecking levels today. Call with any concerns. Will adjust dose as needed.

## 2016-10-27 NOTE — Assessment & Plan Note (Signed)
Under good control on recheck. Continue current regimen. Continue to monitor. Call with any concerns.

## 2016-10-27 NOTE — Patient Instructions (Addendum)
Preventative Services:  Health Risk Assessment and Personalized Prevention Plan: Done today Bone Mass Measurements: Up to date Breast Cancer Screening: Ordered today CVD Screening: Done today Cervical Cancer Screening: N/A Colon Cancer Screening: Up to Date Depression Screening: Up to Date Diabetes Screening: Done today Glaucoma Screening: See your eye doctor Hepatitis B vaccine: N/A Hepatitis C screening: Up to date HIV Screening: Up to date Flu Vaccine: Up to date Lung cancer Screening: Up to date Obesity Screening: Done today Pneumonia Vaccines (2): Up to date STI Screening: N/A  Health Maintenance, Female Adopting a healthy lifestyle and getting preventive care can go a long way to promote health and wellness. Talk with your health care provider about what schedule of regular examinations is right for you. This is a good chance for you to check in with your provider about disease prevention and staying healthy. In between checkups, there are plenty of things you can do on your own. Experts have done a lot of research about which lifestyle changes and preventive measures are most likely to keep you healthy. Ask your health care provider for more information. Weight and diet Eat a healthy diet  Be sure to include plenty of vegetables, fruits, low-fat dairy products, and lean protein.  Do not eat a lot of foods high in solid fats, added sugars, or salt.  Get regular exercise. This is one of the most important things you can do for your health.  Most adults should exercise for at least 150 minutes each week. The exercise should increase your heart rate and make you sweat (moderate-intensity exercise).  Most adults should also do strengthening exercises at least twice a week. This is in addition to the moderate-intensity exercise. Maintain a healthy weight  Body mass index (BMI) is a measurement that can be used to identify possible weight problems. It estimates body fat based on  height and weight. Your health care provider can help determine your BMI and help you achieve or maintain a healthy weight.  For females 57 years of age and older:  A BMI below 18.5 is considered underweight.  A BMI of 18.5 to 24.9 is normal.  A BMI of 25 to 29.9 is considered overweight.  A BMI of 30 and above is considered obese. Watch levels of cholesterol and blood lipids  You should start having your blood tested for lipids and cholesterol at 68 years of age, then have this test every 5 years.  You may need to have your cholesterol levels checked more often if:  Your lipid or cholesterol levels are high.  You are older than 69 years of age.  You are at high risk for heart disease. Cancer screening Lung Cancer  Lung cancer screening is recommended for adults 69-38 years old who are at high risk for lung cancer because of a history of smoking.  A yearly low-dose CT scan of the lungs is recommended for people who:  Currently smoke.  Have quit within the past 15 years.  Have at least a 30-pack-year history of smoking. A pack year is smoking an average of one pack of cigarettes a day for 1 year.  Yearly screening should continue until it has been 15 years since you quit.  Yearly screening should stop if you develop a health problem that would prevent you from having lung cancer treatment. Breast Cancer  Practice breast self-awareness. This means understanding how your breasts normally appear and feel.  It also means doing regular breast self-exams. Let your health care  provider know about any changes, no matter how small.  If you are in your 20s or 30s, you should have a clinical breast exam (CBE) by a health care provider every 1-3 years as part of a regular health exam.  If you are 33 or older, have a CBE every year. Also consider having a breast X-ray (mammogram) every year.  If you have a family history of breast cancer, talk to your health care provider about  genetic screening.  If you are at high risk for breast cancer, talk to your health care provider about having an MRI and a mammogram every year.  Breast cancer gene (BRCA) assessment is recommended for women who have family members with BRCA-related cancers. BRCA-related cancers include:  Breast.  Ovarian.  Tubal.  Peritoneal cancers.  Results of the assessment will determine the need for genetic counseling and BRCA1 and BRCA2 testing. Cervical Cancer  Your health care provider may recommend that you be screened regularly for cancer of the pelvic organs (ovaries, uterus, and vagina). This screening involves a pelvic examination, including checking for microscopic changes to the surface of your cervix (Pap test). You may be encouraged to have this screening done every 3 years, beginning at age 95.  For women ages 5-65, health care providers may recommend pelvic exams and Pap testing every 3 years, or they may recommend the Pap and pelvic exam, combined with testing for human papilloma virus (HPV), every 5 years. Some types of HPV increase your risk of cervical cancer. Testing for HPV may also be done on women of any age with unclear Pap test results.  Other health care providers may not recommend any screening for nonpregnant women who are considered low risk for pelvic cancer and who do not have symptoms. Ask your health care provider if a screening pelvic exam is right for you.  If you have had past treatment for cervical cancer or a condition that could lead to cancer, you need Pap tests and screening for cancer for at least 20 years after your treatment. If Pap tests have been discontinued, your risk factors (such as having a new sexual partner) need to be reassessed to determine if screening should resume. Some women have medical problems that increase the chance of getting cervical cancer. In these cases, your health care provider may recommend more frequent screening and Pap  tests. Colorectal Cancer  This type of cancer can be detected and often prevented.  Routine colorectal cancer screening usually begins at 68 years of age and continues through 68 years of age.  Your health care provider may recommend screening at an earlier age if you have risk factors for colon cancer.  Your health care provider may also recommend using home test kits to check for hidden blood in the stool.  A small camera at the end of a tube can be used to examine your colon directly (sigmoidoscopy or colonoscopy). This is done to check for the earliest forms of colorectal cancer.  Routine screening usually begins at age 6.  Direct examination of the colon should be repeated every 5-10 years through 68 years of age. However, you may need to be screened more often if early forms of precancerous polyps or small growths are found. Skin Cancer  Check your skin from head to toe regularly.  Tell your health care provider about any new moles or changes in moles, especially if there is a change in a mole's shape or color.  Also tell your health care  provider if you have a mole that is larger than the size of a pencil eraser.  Always use sunscreen. Apply sunscreen liberally and repeatedly throughout the day.  Protect yourself by wearing long sleeves, pants, a wide-brimmed hat, and sunglasses whenever you are outside. Heart disease, diabetes, and high blood pressure  High blood pressure causes heart disease and increases the risk of stroke. High blood pressure is more likely to develop in:  People who have blood pressure in the high end of the normal range (130-139/85-89 mm Hg).  People who are overweight or obese.  People who are African American.  If you are 52-1 years of age, have your blood pressure checked every 3-5 years. If you are 43 years of age or older, have your blood pressure checked every year. You should have your blood pressure measured twice-once when you are at a  hospital or clinic, and once when you are not at a hospital or clinic. Record the average of the two measurements. To check your blood pressure when you are not at a hospital or clinic, you can use:  An automated blood pressure machine at a pharmacy.  A home blood pressure monitor.  If you are between 57 years and 10 years old, ask your health care provider if you should take aspirin to prevent strokes.  Have regular diabetes screenings. This involves taking a blood sample to check your fasting blood sugar level.  If you are at a normal weight and have a low risk for diabetes, have this test once every three years after 68 years of age.  If you are overweight and have a high risk for diabetes, consider being tested at a younger age or more often. Preventing infection Hepatitis B  If you have a higher risk for hepatitis B, you should be screened for this virus. You are considered at high risk for hepatitis B if:  You were born in a country where hepatitis B is common. Ask your health care provider which countries are considered high risk.  Your parents were born in a high-risk country, and you have not been immunized against hepatitis B (hepatitis B vaccine).  You have HIV or AIDS.  You use needles to inject street drugs.  You live with someone who has hepatitis B.  You have had sex with someone who has hepatitis B.  You get hemodialysis treatment.  You take certain medicines for conditions, including cancer, organ transplantation, and autoimmune conditions. Hepatitis C  Blood testing is recommended for:  Everyone born from [birth year] through 1967.  Anyone with known risk factors for hepatitis C. Sexually transmitted infections (STIs)  You should be screened for sexually transmitted infections (STIs) including gonorrhea and chlamydia if:  You are sexually active and are younger than 68 years of age.  You are older than 68 years of age and your health care provider tells you  that you are at risk for this type of infection.  Your sexual activity has changed since you were last screened and you are at an increased risk for chlamydia or gonorrhea. Ask your health care provider if you are at risk.  If you do not have HIV, but are at risk, it may be recommended that you take a prescription medicine daily to prevent HIV infection. This is called pre-exposure prophylaxis (PrEP). You are considered at risk if:  You are sexually active and do not regularly use condoms or know the HIV status of your partner(s).  You take drugs by injection.  You are sexually active with a partner who has HIV. Talk with your health care provider about whether you are at high risk of being infected with HIV. If you choose to begin PrEP, you should first be tested for HIV. You should then be tested every 3 months for as long as you are taking PrEP. Pregnancy  If you are premenopausal and you may become pregnant, ask your health care provider about preconception counseling.  If you may become pregnant, take 400 to 800 micrograms (mcg) of folic acid every day.  If you want to prevent pregnancy, talk to your health care provider about birth control (contraception). Osteoporosis and menopause  Osteoporosis is a disease in which the bones lose minerals and strength with aging. This can result in serious bone fractures. Your risk for osteoporosis can be identified using a bone density scan.  If you are 46 years of age or older, or if you are at risk for osteoporosis and fractures, ask your health care provider if you should be screened.  Ask your health care provider whether you should take a calcium or vitamin D supplement to lower your risk for osteoporosis.  Menopause may have certain physical symptoms and risks.  Hormone replacement therapy may reduce some of these symptoms and risks. Talk to your health care provider about whether hormone replacement therapy is right for you. Follow  these instructions at home:  Schedule regular health, dental, and eye exams.  Stay current with your immunizations.  Do not use any tobacco products including cigarettes, chewing tobacco, or electronic cigarettes.  If you are pregnant, do not drink alcohol.  If you are breastfeeding, limit how much and how often you drink alcohol.  Limit alcohol intake to no more than 1 drink per day for nonpregnant women. One drink equals 12 ounces of beer, 5 ounces of wine, or 1 ounces of hard liquor.  Do not use street drugs.  Do not share needles.  Ask your health care provider for help if you need support or information about quitting drugs.  Tell your health care provider if you often feel depressed.  Tell your health care provider if you have ever been abused or do not feel safe at home. This information is not intended to replace advice given to you by your health care provider. Make sure you discuss any questions you have with your health care provider. Document Released: 12/29/2010 Document Revised: 11/21/2015 Document Reviewed: 03/19/2015 Elsevier Interactive Patient Education  2017 [employer] Maintenance for Postmenopausal Women Menopause is a normal process in which your reproductive ability comes to an end. This process happens gradually over a span of months to years, usually between the ages of 6 and 90. Menopause is complete when you have missed 12 consecutive menstrual periods. It is important to talk with your health care provider about some of the most common conditions that affect postmenopausal women, such as heart disease, cancer, and bone loss (osteoporosis). Adopting a healthy lifestyle and getting preventive care can help to promote your health and wellness. Those actions can also lower your chances of developing some of these common conditions. What should I know about menopause? During menopause, you may experience a number of symptoms, such  as:  Moderate-to-severe hot flashes.  Night sweats.  Decrease in sex drive.  Mood swings.  Headaches.  Tiredness.  Irritability.  Memory problems.  Insomnia. Choosing to treat or not to treat menopausal changes is an individual decision that you make with your  health care provider. What should I know about hormone replacement therapy and supplements? Hormone therapy products are effective for treating symptoms that are associated with menopause, such as hot flashes and night sweats. Hormone replacement carries certain risks, especially as you become older. If you are thinking about using estrogen or estrogen with progestin treatments, discuss the benefits and risks with your health care provider. What should I know about heart disease and stroke? Heart disease, heart attack, and stroke become more likely as you age. This may be due, in part, to the hormonal changes that your body experiences during menopause. These can affect how your body processes dietary fats, triglycerides, and cholesterol. Heart attack and stroke are both medical emergencies. There are many things that you can do to help prevent heart disease and stroke:  Have your blood pressure checked at least every 1-2 years. High blood pressure causes heart disease and increases the risk of stroke.  If you are 24-29 years old, ask your health care provider if you should take aspirin to prevent a heart attack or a stroke.  Do not use any tobacco products, including cigarettes, chewing tobacco, or electronic cigarettes. If you need help quitting, ask your health care provider.  It is important to eat a healthy diet and maintain a healthy weight.  Be sure to include plenty of vegetables, fruits, low-fat dairy products, and lean protein.  Avoid eating foods that are high in solid fats, added sugars, or salt (sodium).  Get regular exercise. This is one of the most important things that you can do for your health.  Try to  exercise for at least 150 minutes each week. The type of exercise that you do should increase your heart rate and make you sweat. This is known as moderate-intensity exercise.  Try to do strengthening exercises at least twice each week. Do these in addition to the moderate-intensity exercise.  Know your numbers.Ask your health care provider to check your cholesterol and your blood glucose. Continue to have your blood tested as directed by your health care provider. What should I know about cancer screening? There are several types of cancer. Take the following steps to reduce your risk and to catch any cancer development as early as possible. Breast Cancer  Practice breast self-awareness.  This means understanding how your breasts normally appear and feel.  It also means doing regular breast self-exams. Let your health care provider know about any changes, no matter how small.  If you are 42 or older, have a clinician do a breast exam (clinical breast exam or CBE) every year. Depending on your age, family history, and medical history, it may be recommended that you also have a yearly breast X-ray (mammogram).  If you have a family history of breast cancer, talk with your health care provider about genetic screening.  If you are at high risk for breast cancer, talk with your health care provider about having an MRI and a mammogram every year.  Breast cancer (BRCA) gene test is recommended for women who have family members with BRCA-related cancers. Results of the assessment will determine the need for genetic counseling and BRCA1 and for BRCA2 testing. BRCA-related cancers include these types:  Breast. This occurs in males or females.  Ovarian.  Tubal. This may also be called fallopian tube cancer.  Cancer of the abdominal or pelvic lining (peritoneal cancer).  Prostate.  Pancreatic. Cervical, Uterine, and Ovarian Cancer  Your health care provider may recommend that you be screened  regularly for cancer of the pelvic organs. These include your ovaries, uterus, and vagina. This screening involves a pelvic exam, which includes checking for microscopic changes to the surface of your cervix (Pap test).  For women ages 21-65, health care providers may recommend a pelvic exam and a Pap test every three years. For women ages 2-65, they may recommend the Pap test and pelvic exam, combined with testing for human papilloma virus (HPV), every five years. Some types of HPV increase your risk of cervical cancer. Testing for HPV may also be done on women of any age who have unclear Pap test results.  Other health care providers may not recommend any screening for nonpregnant women who are considered low risk for pelvic cancer and have no symptoms. Ask your health care provider if a screening pelvic exam is right for you.  If you have had past treatment for cervical cancer or a condition that could lead to cancer, you need Pap tests and screening for cancer for at least 20 years after your treatment. If Pap tests have been discontinued for you, your risk factors (such as having a new sexual partner) need to be reassessed to determine if you should start having screenings again. Some women have medical problems that increase the chance of getting cervical cancer. In these cases, your health care provider may recommend that you have screening and Pap tests more often.  If you have a family history of uterine cancer or ovarian cancer, talk with your health care provider about genetic screening.  If you have vaginal bleeding after reaching menopause, tell your health care provider.  There are currently no reliable tests available to screen for ovarian cancer. Lung Cancer  Lung cancer screening is recommended for adults 63-19 years old who are at high risk for lung cancer because of a history of smoking. A yearly low-dose CT scan of the lungs is recommended if you:  Currently smoke.  Have a  history of at least 30 pack-years of smoking and you currently smoke or have quit within the past 15 years. A pack-year is smoking an average of one pack of cigarettes per day for one year. Yearly screening should:  Continue until it has been 15 years since you quit.  Stop if you develop a health problem that would prevent you from having lung cancer treatment. Colorectal Cancer  This type of cancer can be detected and can often be prevented.  Routine colorectal cancer screening usually begins at age 26 and continues through age 36.  If you have risk factors for colon cancer, your health care provider may recommend that you be screened at an earlier age.  If you have a family history of colorectal cancer, talk with your health care provider about genetic screening.  Your health care provider may also recommend using home test kits to check for hidden blood in your stool.  A small camera at the end of a tube can be used to examine your colon directly (sigmoidoscopy or colonoscopy). This is done to check for the earliest forms of colorectal cancer.  Direct examination of the colon should be repeated every 5-10 years until age 35. However, if early forms of precancerous polyps or small growths are found or if you have a family history or genetic risk for colorectal cancer, you may need to be screened more often. Skin Cancer  Check your skin from head to toe regularly.  Monitor any moles. Be sure to tell your health care provider:  About any new moles or changes in moles, especially if there is a change in a mole's shape or color.  If you have a mole that is larger than the size of a pencil eraser.  If any of your family members has a history of skin cancer, especially at a young age, talk with your health care provider about genetic screening.  Always use sunscreen. Apply sunscreen liberally and repeatedly throughout the day.  Whenever you are outside, protect yourself by wearing long  sleeves, pants, a wide-brimmed hat, and sunglasses. What should I know about osteoporosis? Osteoporosis is a condition in which bone destruction happens more quickly than new bone creation. After menopause, you may be at an increased risk for osteoporosis. To help prevent osteoporosis or the bone fractures that can happen because of osteoporosis, the following is recommended:  If you are 18-66 years old, get at least 1,000 mg of calcium and at least 600 mg of vitamin D per day.  If you are older than age 63 but younger than age 38, get at least 1,200 mg of calcium and at least 600 mg of vitamin D per day.  If you are older than age 89, get at least 1,200 mg of calcium and at least 800 mg of vitamin D per day. Smoking and excessive alcohol intake increase the risk of osteoporosis. Eat foods that are rich in calcium and vitamin D, and do weight-bearing exercises several times each week as directed by your health care provider. What should I know about how menopause affects my mental health? Depression may occur at any age, but it is more common as you become older. Common symptoms of depression include:  Low or sad mood.  Changes in sleep patterns.  Changes in appetite or eating patterns.  Feeling an overall lack of motivation or enjoyment of activities that you previously enjoyed.  Frequent crying spells. Talk with your health care provider if you think that you are experiencing depression. What should I know about immunizations? It is important that you get and maintain your immunizations. These include:  Tetanus, diphtheria, and pertussis (Tdap) booster vaccine.  Influenza every year before the flu season begins.  Pneumonia vaccine.  Shingles vaccine. Your health care provider may also recommend other immunizations. This information is not intended to replace advice given to you by your health care provider. Make sure you discuss any questions you have with your health care  provider. Document Released: 08/07/2005 Document Revised: 01/03/2016 Document Reviewed: 03/19/2015 Elsevier Interactive Patient Education  2017 [employer].

## 2016-10-27 NOTE — Assessment & Plan Note (Signed)
Continues to follow with cardiology. Call with any concerns. Continue current regimen. Continue to monitor.

## 2016-10-28 LAB — CBC WITH DIFFERENTIAL/PLATELET
BASOS: 1 %
Basophils Absolute: 0.1 10*3/uL (ref 0.0–0.2)
EOS (ABSOLUTE): 0 10*3/uL (ref 0.0–0.4)
EOS: 1 %
HEMATOCRIT: 40 % (ref 34.0–46.6)
HEMOGLOBIN: 13 g/dL (ref 11.1–15.9)
Immature Grans (Abs): 0 10*3/uL (ref 0.0–0.1)
Immature Granulocytes: 0 %
LYMPHS ABS: 1.7 10*3/uL (ref 0.7–3.1)
Lymphs: 34 %
MCH: 33.3 pg — AB (ref 26.6–33.0)
MCHC: 32.5 g/dL (ref 31.5–35.7)
MCV: 103 fL — AB (ref 79–97)
MONOCYTES: 6 %
Monocytes Absolute: 0.3 10*3/uL (ref 0.1–0.9)
NEUTROS ABS: 2.9 10*3/uL (ref 1.4–7.0)
Neutrophils: 58 %
Platelets: 432 10*3/uL — ABNORMAL HIGH (ref 150–379)
RBC: 3.9 x10E6/uL (ref 3.77–5.28)
RDW: 16.1 % — ABNORMAL HIGH (ref 12.3–15.4)
WBC: 5 10*3/uL (ref 3.4–10.8)

## 2016-10-28 LAB — COMPREHENSIVE METABOLIC PANEL
ALBUMIN: 4 g/dL (ref 3.6–4.8)
ALK PHOS: 76 IU/L (ref 39–117)
ALT: 40 IU/L — ABNORMAL HIGH (ref 0–32)
AST: 29 IU/L (ref 0–40)
Albumin/Globulin Ratio: 1.6 (ref 1.2–2.2)
BILIRUBIN TOTAL: 0.4 mg/dL (ref 0.0–1.2)
BUN / CREAT RATIO: 20 (ref 12–28)
BUN: 18 mg/dL (ref 8–27)
CHLORIDE: 100 mmol/L (ref 96–106)
CO2: 28 mmol/L (ref 18–29)
Calcium: 10 mg/dL (ref 8.7–10.3)
Creatinine, Ser: 0.91 mg/dL (ref 0.57–1.00)
GFR calc non Af Amer: 65 mL/min/{1.73_m2} (ref >59)
GFR, EST AFRICAN AMERICAN: 75 mL/min/{1.73_m2} (ref >59)
GLOBULIN, TOTAL: 2.5 g/dL (ref 1.5–4.5)
Glucose: 94 mg/dL (ref 65–99)
Potassium: 5.2 mmol/L (ref 3.5–5.2)
SODIUM: 141 mmol/L (ref 134–144)
TOTAL PROTEIN: 6.5 g/dL (ref 6.0–8.5)

## 2016-10-28 LAB — LIPID PANEL W/O CHOL/HDL RATIO
CHOLESTEROL TOTAL: 166 mg/dL (ref 100–199)
HDL: 43 mg/dL (ref >39)
LDL CALC: 90 mg/dL (ref 0–99)
Triglycerides: 166 mg/dL — ABNORMAL HIGH (ref 0–149)
VLDL Cholesterol Cal: 33 mg/dL (ref 5–40)

## 2016-10-28 LAB — TSH: TSH: 1.08 u[IU]/mL (ref 0.450–4.500)

## 2016-10-28 LAB — B12 AND FOLATE PANEL: VITAMIN B 12: 610 pg/mL (ref 232–1245)

## 2016-10-30 ENCOUNTER — Encounter: Payer: Self-pay | Admitting: *Deleted

## 2016-11-16 ENCOUNTER — Ambulatory Visit (INDEPENDENT_AMBULATORY_CARE_PROVIDER_SITE_OTHER): Payer: Medicare HMO | Admitting: Family Medicine

## 2016-11-16 ENCOUNTER — Encounter: Payer: Self-pay | Admitting: Family Medicine

## 2016-11-16 VITALS — BP 139/75 | HR 86 | Temp 98.2°F | Wt 147.8 lb

## 2016-11-16 DIAGNOSIS — J069 Acute upper respiratory infection, unspecified: Secondary | ICD-10-CM

## 2016-11-16 MED ORDER — PREDNISONE 50 MG PO TABS: 50.0000 mg | ORAL_TABLET | Freq: Every day | ORAL | 0 refills | Status: DC

## 2016-11-16 MED ORDER — BENZONATATE 200 MG PO CAPS: 200.0000 mg | ORAL_CAPSULE | Freq: Two times a day (BID) | ORAL | 0 refills | Status: DC | PRN

## 2016-11-16 MED ORDER — HYDROCOD POLST-CPM POLST ER 10-8 MG/5ML PO SUER: 5.0000 mL | Freq: Every evening | ORAL | 0 refills | Status: DC | PRN

## 2016-11-16 NOTE — Progress Notes (Signed)
BP 139/75 (BP Location: Left Arm, Patient Position: Sitting, Cuff Size: Normal)   Pulse 86   Temp 98.2 F (36.8 C)   Wt 147 lb 12.8 oz (67 kg)   SpO2 96%   BMI 29.25 kg/m    Subjective:    Patient ID: Joanna Rodriguez, female    DOB: 1950-07-08, 66 y.o.   MRN: [id number]  HPI: Joanna Rodriguez is a 67 y.o. female  Chief Complaint  Patient presents with  . Cough    Sneezing, runny nose and slight cough x's 2 weeks. Cough has worsened since 11/12/16.  . Nasal Congestion   UPPER RESPIRATORY TRACT INFECTION Duration: 2 weeks on Wednesday Worst symptom: cough Fever: no Cough: yes Shortness of breath: no Wheezing: no Chest pain: no Chest tightness: no Chest congestion: no Nasal congestion: yes Runny nose: yes Post nasal drip: yes Sneezing: yes Sore throat: no Swollen glands: no Sinus pressure: no Headache: yes Face pain: no Toothache: no Ear pain: no  Ear pressure: yes "right Eyes red/itching:no Eye drainage/crusting: no  Vomiting: no Rash: no Fatigue: yes Sick contacts: no Strep contacts: no  Context: worse Recurrent sinusitis: no Relief with OTC cold/cough medications: no  Treatments attempted: anti-histamine and cough syrup    Relevant past medical, surgical, family and social history reviewed and updated as indicated. Interim medical history since our last visit reviewed. Allergies and medications reviewed and updated.  Review of Systems  Constitutional: Negative.   HENT: Positive for congestion, postnasal drip, rhinorrhea and sneezing. Negative for dental problem, drooling, ear discharge, ear pain, facial swelling, hearing loss, mouth sores, nosebleeds, sinus pain, sinus pressure, sore throat, tinnitus, trouble swallowing and voice change.   Eyes: Negative.   Respiratory: Positive for cough and shortness of breath. Negative for apnea, choking, chest tightness, wheezing and stridor.   Cardiovascular: Negative.   Psychiatric/Behavioral:  Negative.     Per HPI unless specifically indicated above     Objective:    BP 139/75 (BP Location: Left Arm, Patient Position: Sitting, Cuff Size: Normal)   Pulse 86   Temp 98.2 F (36.8 C)   Wt 147 lb 12.8 oz (67 kg)   SpO2 96%   BMI 29.25 kg/m   Wt Readings from Last 3 Encounters:  11/16/16 147 lb 12.8 oz (67 kg)  10/27/16 144 lb (65.3 kg)  10/26/16 143 lb (64.9 kg)    Physical Exam  Constitutional: She is oriented to person, place, and time. She appears well-developed and well-nourished. No distress.  HENT:  Head: Normocephalic and atraumatic.  Right Ear: Hearing, tympanic membrane, external ear and ear canal normal.  Left Ear: Hearing, tympanic membrane, external ear and ear canal normal. No decreased hearing is noted.  Nose: Mucosal edema and rhinorrhea present.  Mouth/Throat: Uvula is midline and mucous membranes are normal. Posterior oropharyngeal edema and posterior oropharyngeal erythema present.  Eyes: Conjunctivae, EOM and lids are normal. Pupils are equal, round, and reactive to light. Right eye exhibits no discharge. Left eye exhibits no discharge. No scleral icterus.  Neck: Normal range of motion. Neck supple. No JVD present. No tracheal deviation present. No thyromegaly present.  Cardiovascular: Normal rate, regular rhythm, normal heart sounds and intact distal pulses.  Exam reveals no gallop and no friction rub.   No murmur heard. Pulmonary/Chest: Effort normal and breath sounds normal. No stridor. No respiratory distress. She has no wheezes. She has no rales.  Musculoskeletal: Normal range of motion.  Lymphadenopathy:    She has no cervical  adenopathy.  Neurological: She is alert and oriented to person, place, and time.  Skin: Skin is warm, dry and intact. No rash noted. She is not diaphoretic. No erythema. No pallor.  Psychiatric: She has a normal mood and affect. Her speech is normal and behavior is normal. Judgment and thought content normal. Cognition and  memory are normal.  Nursing note and vitals reviewed.   Results for orders placed or performed in visit on 10/27/16  Microscopic Examination  Result Value Ref Range   WBC, UA 0-5 0 - 5 /hpf   RBC, UA 0-2 0 - 2 /hpf   Epithelial Cells (non renal) 0-10 0 - 10 /hpf   Crystals Present (A) N/A   Crystal Type Calcium Oxalate N/A   Mucus, UA Present (A) Not Estab.   Bacteria, UA None seen None seen/Few  CBC with Differential/Platelet  Result Value Ref Range   WBC 5.0 3.4 - 10.8 x10E3/uL   RBC 3.90 3.77 - 5.28 x10E6/uL   Hemoglobin 13.0 11.1 - 15.9 g/dL   Hematocrit 40.0 34.0 - 46.6 %   MCV 103 (H) 79 - 97 fL   MCH 33.3 (H) 26.6 - 33.0 pg   MCHC 32.5 31.5 - 35.7 g/dL   RDW 16.1 (H) 12.3 - 15.4 %   Platelets 432 (H) 150 - 379 x10E3/uL   Neutrophils 58 Not Estab. %   Lymphs 34 Not Estab. %   Monocytes 6 Not Estab. %   Eos 1 Not Estab. %   Basos 1 Not Estab. %   Neutrophils Absolute 2.9 1.4 - 7.0 x10E3/uL   Lymphocytes Absolute 1.7 0.7 - 3.1 x10E3/uL   Monocytes Absolute 0.3 0.1 - 0.9 x10E3/uL   EOS (ABSOLUTE) 0.0 0.0 - 0.4 x10E3/uL   Basophils Absolute 0.1 0.0 - 0.2 x10E3/uL   Immature Granulocytes 0 Not Estab. %   Immature Grans (Abs) 0.0 0.0 - 0.1 x10E3/uL  Comprehensive metabolic panel  Result Value Ref Range   Glucose 94 65 - 99 mg/dL   BUN 18 8 - 27 mg/dL   Creatinine, Ser 0.91 0.57 - 1.00 mg/dL   GFR calc non Af Amer 65 >59 mL/min/1.73   GFR calc Af Amer 75 >59 mL/min/1.73   BUN/Creatinine Ratio 20 12 - 28   Sodium 141 134 - 144 mmol/L   Potassium 5.2 3.5 - 5.2 mmol/L   Chloride 100 96 - 106 mmol/L   CO2 28 18 - 29 mmol/L   Calcium 10.0 8.7 - 10.3 mg/dL   Total Protein 6.5 6.0 - 8.5 g/dL   Albumin 4.0 3.6 - 4.8 g/dL   Globulin, Total 2.5 1.5 - 4.5 g/dL   Albumin/Globulin Ratio 1.6 1.2 - 2.2   Bilirubin Total 0.4 0.0 - 1.2 mg/dL   Alkaline Phosphatase 76 39 - 117 IU/L   AST 29 0 - 40 IU/L   ALT 40 (H) 0 - 32 IU/L  Lipid Panel w/o Chol/HDL Ratio  Result Value Ref  Range   Cholesterol, Total 166 100 - 199 mg/dL   Triglycerides 166 (H) 0 - 149 mg/dL   HDL 43 >39 mg/dL   VLDL Cholesterol Cal 33 5 - 40 mg/dL   LDL Calculated 90 0 - 99 mg/dL  Microalbumin, Urine Waived  Result Value Ref Range   Microalb, Ur Waived 80 (H) 0 - 19 mg/L   Creatinine, Urine Waived 300 10 - 300 mg/dL   Microalb/Creat Ratio 30-300 (H) <30 mg/g  TSH  Result Value Ref Range   TSH  1.080 0.450 - 4.500 uIU/mL  UA/M w/rflx Culture, Routine  Result Value Ref Range   Specific Gravity, UA 1.025 1.005 - 1.030   pH, UA 5.5 5.0 - 7.5   Color, UA Yellow Yellow   Appearance Ur Cloudy (A) Clear   Leukocytes, UA Trace (A) Negative   Protein, UA Trace (A) Negative/Trace   Glucose, UA Negative Negative   Ketones, UA Trace (A) Negative   RBC, UA Negative Negative   Bilirubin, UA Negative Negative   Urobilinogen, Ur 0.2 0.2 - 1.0 mg/dL   Nitrite, UA Negative Negative   Microscopic Examination See below:   B12 and Folate Panel  Result Value Ref Range   Vitamin B-12 610 232 - 1,245 pg/mL   Folate >20.0 >3.0 ng/mL      Assessment & Plan:   Problem List Items Addressed This Visit    None    Visit Diagnoses    Upper respiratory tract infection, unspecified type    -  Primary   Will treat with prednisone for congestion and tessalon and tussionex for comfort. Call with any concerns.        Follow up plan: Return if symptoms worsen or fail to improve.

## 2016-11-19 ENCOUNTER — Telehealth: Payer: Self-pay | Admitting: Family Medicine

## 2016-11-19 NOTE — Telephone Encounter (Signed)
Wonderful. 

## 2016-11-19 NOTE — Telephone Encounter (Signed)
Patient called to follow-up with provider about how she is doing since her last visit.  Please Advise.  Thank you.

## 2016-11-19 NOTE — Telephone Encounter (Signed)
Pt states she is doing great. Did not start medicine until Tuesday, since Pharmacy had to order it. Cough almost resolved. No spastic coughing any more.

## 2016-11-20 ENCOUNTER — Telehealth: Payer: Self-pay | Admitting: Family Medicine

## 2016-11-20 NOTE — Telephone Encounter (Signed)
Authorized with Dr. Tami Ribas.  Cert # [id number]

## 2016-11-20 NOTE — Telephone Encounter (Signed)
Pt would like a referral to go to [facility]. She already has an appt with Tami Ribas next Tuesday but they need a referral.

## 2016-11-24 DIAGNOSIS — H903 Sensorineural hearing loss, bilateral: Secondary | ICD-10-CM | POA: Diagnosis not present

## 2016-11-24 DIAGNOSIS — R42 Dizziness and giddiness: Secondary | ICD-10-CM | POA: Diagnosis not present

## 2016-12-04 ENCOUNTER — Ambulatory Visit
Admission: RE | Admit: 2016-12-04 | Discharge: 2016-12-04 | Disposition: A | Discharge status: Home / Self Care | Payer: Medicare HMO | Source: Ambulatory Visit | Attending: Family Medicine | Admitting: Family Medicine

## 2016-12-04 DIAGNOSIS — Z1239 Encounter for other screening for malignant neoplasm of breast: Secondary | ICD-10-CM

## 2016-12-04 DIAGNOSIS — Z1231 Encounter for screening mammogram for malignant neoplasm of breast: Secondary | ICD-10-CM | POA: Diagnosis not present

## 2016-12-14 ENCOUNTER — Telehealth: Payer: Self-pay | Admitting: Family Medicine

## 2016-12-14 MED ORDER — LEVOTHYROXINE SODIUM 100 MCG PO TABS
100.0000 ug | ORAL_TABLET | Freq: Every day | ORAL | 3 refills | Status: DC
Start: 2016-12-14 — End: 2017-04-30

## 2016-12-14 NOTE — Telephone Encounter (Signed)
No I have not.  Can you please send her Thyroid medication to [facility], it was last checked on Oct 27, 2016 and last refilled May 2017.

## 2016-12-14 NOTE — Telephone Encounter (Signed)
Patient wanted to send an FYI to provider that she received a call about back and joint medication. Patient wanted provider to know that she did not request anything regarding back or joint pain medication.    Patient also wanted to check and see if provider received a fax from [facility] about thyroid medication.   Please Advise.  Thank you

## 2016-12-14 NOTE — Telephone Encounter (Signed)
I don't remember seeing anything about her thyroid. You?

## 2016-12-22 ENCOUNTER — Ambulatory Visit (INDEPENDENT_AMBULATORY_CARE_PROVIDER_SITE_OTHER): Payer: Medicare HMO | Admitting: Pulmonary Disease

## 2016-12-22 ENCOUNTER — Encounter: Payer: Self-pay | Admitting: Pulmonary Disease

## 2016-12-22 VITALS — BP 142/84 | HR 107 | Ht 59.0 in | Wt 146.0 lb

## 2016-12-22 DIAGNOSIS — J449 Chronic obstructive pulmonary disease, unspecified: Secondary | ICD-10-CM

## 2016-12-22 NOTE — Patient Instructions (Signed)
Continue Spiriva inhaler as you are currently using We have made referral to pulmonary rehabilitation program Follow-up in 6 months or sooner as needed

## 2016-12-22 NOTE — Progress Notes (Signed)
PULMONARY OFFICE FOLLOW UP NOTE  PROBLEMS:  Former smoker Moderate COPD History of spontaneous left PTX X 2 - s/p L pleurodesis   DATA: PFTs 10/23/14: moderate obstruction, FEV1 1.30 L (58% pred), normal lung volumes, normal DLCO LDCT 10/25/15: moderate emphysema, sequelae from prior pleurodesis, no suspicious nodules LDCT 10/26/16: No worrisome or new findings  INTERVAL HISTORY: Previous pt of [facility] last seen 05/27/16. She has recently undergone surgery on her R foot for a hammer toe.   SUBJ: This is a routine follow up. She has no new complaints. She has recovered well from her foot surgery. She has minimal exertional dyspnea but is admittedly somewhat sedentary. She uses Spiriva infrequently, only when she feels that she needs it. This strategy is working for her. She has only used it 2 times in the past 6 weeks. She requests enrollment in pulmonary rehabilitation   OBJ: Vitals:   12/22/16 0859  BP: (!) 142/84  Pulse: (!) 107  SpO2: 96%  Weight: 146 lb (66.2 kg)  Height: 4' 11"  (1.499 m)    Gen: NAD HEENT: WNL Neck: No JVD noted Lungs: BS minimally diminished, no wheezes Cardiovascular: Regular, no murmurs Abdomen: Soft, NT +BS Ext: no C/C/E  Neuro: grossly intact  DATA: BMP Latest Ref Rng & Units 10/27/2016 07/27/2016 04/29/2016  Glucose 65 - 99 mg/dL 94 81 101(H)  BUN 8 - 27 mg/dL 18 16 15   Creatinine 0.57 - 1.00 mg/dL 0.91 0.74 0.62  BUN/Creat Ratio 12 - 28 20 22 24   Sodium 134 - 144 mmol/L 141 145(H) 143  Potassium 3.5 - 5.2 mmol/L 5.2 5.1 4.8  Chloride 96 - 106 mmol/L 100 105 103  CO2 18 - 29 mmol/L 28 26 27   Calcium 8.7 - 10.3 mg/dL 10.0 9.7 9.5   CBC Latest Ref Rng & Units 10/27/2016 07/27/2016 04/29/2016  WBC 3.4 - 10.8 x10E3/uL 5.0 5.1 5.6  Hemoglobin 11.1 - 15.9 g/dL 13.0 12.3 12.2  Hematocrit 34.0 - 46.6 % 40.0 35.8 36.3  Platelets 150 - 379 x10E3/uL 432(H) 392(H) 437(H)   No new CXR  IMPRESSION: Stage 2 moderate COPD by GOLD classification ([place]) - Plan:  AMB referral to pulmonary rehabilitation PLAN: Continue Spiriva as she is currently using Pulmonary rehabilitation referral Follow-up in 6 months or sooner as needed   Merton Border, MD PCCM service Mobile [phone] Pager [phone] 12/22/2016

## 2016-12-28 ENCOUNTER — Encounter: Payer: Medicare HMO | Attending: Pulmonary Disease

## 2016-12-28 VITALS — Ht 60.7 in | Wt 148.4 lb

## 2016-12-28 DIAGNOSIS — M1990 Unspecified osteoarthritis, unspecified site: Secondary | ICD-10-CM | POA: Insufficient documentation

## 2016-12-28 DIAGNOSIS — M419 Scoliosis, unspecified: Secondary | ICD-10-CM | POA: Insufficient documentation

## 2016-12-28 DIAGNOSIS — K5090 Crohn's disease, unspecified, without complications: Secondary | ICD-10-CM | POA: Diagnosis not present

## 2016-12-28 DIAGNOSIS — I10 Essential (primary) hypertension: Secondary | ICD-10-CM | POA: Insufficient documentation

## 2016-12-28 DIAGNOSIS — Z79899 Other long term (current) drug therapy: Secondary | ICD-10-CM | POA: Diagnosis not present

## 2016-12-28 DIAGNOSIS — Z87891 Personal history of nicotine dependence: Secondary | ICD-10-CM | POA: Diagnosis not present

## 2016-12-28 DIAGNOSIS — J449 Chronic obstructive pulmonary disease, unspecified: Secondary | ICD-10-CM | POA: Insufficient documentation

## 2016-12-28 DIAGNOSIS — Z87442 Personal history of urinary calculi: Secondary | ICD-10-CM | POA: Insufficient documentation

## 2016-12-28 DIAGNOSIS — M8580 Other specified disorders of bone density and structure, unspecified site: Secondary | ICD-10-CM | POA: Insufficient documentation

## 2016-12-28 DIAGNOSIS — E039 Hypothyroidism, unspecified: Secondary | ICD-10-CM | POA: Diagnosis not present

## 2016-12-28 NOTE — Progress Notes (Signed)
Pulmonary Individual Treatment Plan  Patient Details  Name: Joanna Rodriguez MRN: [id number] Date of Birth: 07-23-1950 Referring Provider:     Pulmonary Rehab from 12/28/2016 in [facility] Cardiac and Pulmonary Rehab  Referring Provider  Merton Border MD      Initial Encounter Date:    Pulmonary Rehab from 12/28/2016 in [facility] Cardiac and Pulmonary Rehab  Date  12/28/16  Referring Provider  Merton Border MD      Visit Diagnosis: Chronic obstructive pulmonary disease, unspecified COPD type ([place])  Patient's Home Medications on Admission:  Current Outpatient Prescriptions:  .  acyclovir (ZOVIRAX) 400 MG tablet, Take 1 tablet (400 mg total) by mouth daily., Disp: 90 tablet, Rfl: 3 .  Alum & Mag Hydroxide-Simeth (ANTACID ANTI-GAS MAX STRENGTH PO), Take 1 each by mouth daily. Reported on 11/21/2015, Disp: , Rfl:  .  aspirin EC 81 MG tablet, Take 81 mg by mouth daily., Disp: , Rfl:  .  B Complex-Folic Acid (B COMPLEX-VITAMIN B12 PO), Take 1 tablet by mouth daily., Disp: , Rfl:  .  BLACK COHOSH PO, Take by mouth daily., Disp: , Rfl:  .  buPROPion (WELLBUTRIN SR) 150 MG 12 hr tablet, 1 tab daily for 1 week, then 1 tab BID, Disp: 180 tablet, Rfl: 3 .  Calcium Carb-Cholecalciferol 600-800 MG-UNIT TABS, Take by mouth 2 (two) times daily., Disp: , Rfl:  .  Co-Enzyme Q-10 30 MG CAPS, Take 1 capsule by mouth daily., Disp: , Rfl:  .  Glucosamine-Chondroit-Vit C-Mn (SM GLUCOSAMINE/CHONDROITIN PO), Take 1,500 mg by mouth 2 (two) times daily., Disp: , Rfl:  .  levothyroxine (SYNTHROID, LEVOTHROID) 100 MCG tablet, Take 1 tablet (100 mcg total) by mouth daily., Disp: 90 tablet, Rfl: 3 .  lisinopril (PRINIVIL,ZESTRIL) 5 MG tablet, Take 1 tablet (5 mg total) by mouth daily., Disp: 90 tablet, Rfl: 1 .  Melatonin 5 MG TABS, Take 5 mg by mouth at bedtime as needed., Disp: , Rfl:  .  mercaptopurine (PURINETHOL) 50 MG tablet, TAKE 1 AND 1/2 TABLETS EVERY DAY, Disp: 135 tablet, Rfl: 3 .  Multiple Vitamins-Minerals  (MULTIVITAMIN WITH MINERALS) tablet, Take 1 tablet by mouth daily., Disp: , Rfl:  .  Omega-3 Fatty Acids (FISH OIL PO), Take 1,200 mg by mouth 2 (two) times daily., Disp: , Rfl:  .  pantoprazole (PROTONIX) 40 MG tablet, TAKE 1 TABLET EVERY DAY, Disp: 90 tablet, Rfl: 3 .  Tiotropium Bromide Monohydrate (SPIRIVA RESPIMAT) 2.5 MCG/ACT AERS, Inhale 2 puffs into the lungs daily. Gargle and rinse after each use, Disp: 3 Inhaler, Rfl: 3 .  traMADol (ULTRAM) 50 MG tablet, TAKE 1 TABLET THREE TIMES DAILY, Disp: 270 tablet, Rfl: 1  Past Medical History: Past Medical History:  Diagnosis Date  . Arthritis   . Cancer [facility])    face  . COPD (chronic obstructive pulmonary disease) ([place])   . Crohn's disease ([place])   . Dyspnea on exertion   . Genital herpes   . Hypertension   . Hypothyroid   . Kidney stones   . Osteopenia   . Personal history of tobacco use, presenting hazards to health 10/23/2015  . Scoliosis   . Spontaneous pneumothorax   . Vertigo    last episode 11/14/15  . Volvulus ([place])     Tobacco Use: History  Smoking Status  . Former Smoker  . Packs/day: 1.50  . Years: 53.00  . Types: Cigarettes  . Quit date: 08/22/2014  Smokeless Tobacco  . Never Used    Comment: NOV 16,2015  Labs: Recent Review Flowsheet Data    Labs for ITP Cardiac and Pulmonary Rehab Latest Ref Rng & Units 10/28/2015 10/27/2016   Cholestrol 100 - 199 mg/dL 141 166   LDLCALC 0 - 99 mg/dL 49 90   HDL >39 mg/dL 48 43   Trlycerides 0 - 149 mg/dL 221(H) 166(H)       ADL UCSD:     Pulmonary Assessment Scores    Row Name 12/28/16 1636         ADL UCSD   ADL Phase Entry     SOB Score total 39     Rest 1     Walk 1     Stairs 0     Bath 0     Dress 0     Shop 2       mMRC Score   mMRC Score 2.5        Pulmonary Function Assessment:     Pulmonary Function Assessment - 12/28/16 1625      Pulmonary Function Tests   RV% 116 %   DLCO% 81 %     Initial Spirometry Results   FVC% 57 %   FEV1%  53 %   FEV1/FVC Ratio 69.9     Post Bronchodilator Spirometry Results   FVC% 76 %   FEV1% 68 %   FEV1/FVC Ratio 67.83      Exercise Target Goals: Date: 12/28/16  Exercise Program Goal: Individual exercise prescription set with THRR, safety & activity barriers. Participant demonstrates ability to understand and report RPE using BORG scale, to self-measure pulse accurately, and to acknowledge the importance of the exercise prescription.  Exercise Prescription Goal: Starting with aerobic activity 30 plus minutes a day, 3 days per week for initial exercise prescription. Provide home exercise prescription and guidelines that participant acknowledges understanding prior to discharge.  Activity Barriers & Risk Stratification:     Activity Barriers & Cardiac Risk Stratification - 12/28/16 1623      Activity Barriers & Cardiac Risk Stratification   Activity Barriers Back Problems;Deconditioning;Muscular Weakness;Shortness of Breath  no longer has to take as much tramadol      6 Minute Walk:     6 Minute Walk    Row Name 12/28/16 1622         6 Minute Walk   Phase Initial     Distance 1300 feet     Walk Time 6 minutes     # of Rest Breaks 0     MPH 2.46     METS 3.13     RPE 11     Perceived Dyspnea  1     VO2 Peak 10.94     Symptoms Yes (comment)     Comments slightly SOB     Resting HR 83 bpm     Resting BP 126/64     Max Ex. HR 116 bpm     Max Ex. BP 146/60     2 Minute Post BP 144/56  additional reading 126/64       Interval HR   Baseline HR 83     1 Minute HR 106     2 Minute HR 122     3 Minute HR 113     4 Minute HR 113     5 Minute HR 115     6 Minute HR 116     2 Minute Post HR 94     Interval Heart Rate? Yes  Interval Oxygen   Interval Oxygen? Yes     Baseline Oxygen Saturation % 96 %     Baseline Liters of Oxygen 0 L  Room Air     1 Minute Oxygen Saturation % 94 %     1 Minute Liters of Oxygen 0 L     2 Minute Oxygen Saturation % 95 %      2 Minute Liters of Oxygen 0 L     3 Minute Oxygen Saturation % 92 %     3 Minute Liters of Oxygen 0 L     4 Minute Oxygen Saturation % 93 %     4 Minute Liters of Oxygen 0 L     5 Minute Oxygen Saturation % 94 %     5 Minute Liters of Oxygen 0 L     6 Minute Oxygen Saturation % 94 %     6 Minute Liters of Oxygen 0 L     2 Minute Post Oxygen Saturation % 98 %     2 Minute Post Liters of Oxygen 0 L       Oxygen Initial Assessment:     Oxygen Initial Assessment - 12/28/16 1620      Home Oxygen   Home Oxygen Device None     Intervention   Short Term Goals To learn and understand importance of monitoring SPO2 with pulse oximeter and demonstrate accurate use of the pulse oximeter.;To Learn and understand importance of maintaining oxygen saturations>88%;To learn and demonstrate proper purse lipped breathing techniques or other breathing techniques.;To learn and demonstrate proper use of respiratory medications   Long  Term Goals Maintenance of O2 saturations>88%;Compliance with respiratory medication;Demonstrates proper use of MDI's;Verbalizes importance of monitoring SPO2 with pulse oximeter and return demonstration;Exhibits proper breathing techniques, such as purse lipped breathing or other method taught during program session      Oxygen Re-Evaluation:   Oxygen Discharge (Final Oxygen Re-Evaluation):   Initial Exercise Prescription:     Initial Exercise Prescription - 12/28/16 1600      Date of Initial Exercise RX and Referring Provider   Date 12/28/16   Referring Provider Merton Border MD     Treadmill   MPH 2.2   Grade 0.5   Minutes 15   METs 2.84     NuStep   Level 2   SPM 80   Minutes 15   METs 2     REL-XR   Level 1   Speed 50   Minutes 15   METs 2     Prescription Details   Frequency (times per week) 3   Duration Progress to 45 minutes of aerobic exercise without signs/symptoms of physical distress     Intensity   THRR 40-80% of Max Heartrate  111-138   Ratings of Perceived Exertion 11-13   Perceived Dyspnea 0-4     Progression   Progression Continue to progress workloads to maintain intensity without signs/symptoms of physical distress.     Resistance Training   Training Prescription Yes   Weight 2 lbs   Reps 10-15      Perform Capillary Blood Glucose checks as needed.  Exercise Prescription Changes:   Exercise Comments:   Exercise Goals and Review:     Exercise Goals    Row Name 12/28/16 1627             Exercise Goals   Increase Physical Activity Yes       Intervention Provide advice, education, support and counseling  about physical activity/exercise needs.;Develop an individualized exercise prescription for aerobic and resistive training based on initial evaluation findings, risk stratification, comorbidities and participant's personal goals.       Expected Outcomes Achievement of increased cardiorespiratory fitness and enhanced flexibility, muscular endurance and strength shown through measurements of functional capacity and personal statement of participant.       Increase Strength and Stamina Yes       Intervention Provide advice, education, support and counseling about physical activity/exercise needs.;Develop an individualized exercise prescription for aerobic and resistive training based on initial evaluation findings, risk stratification, comorbidities and participant's personal goals.       Expected Outcomes Achievement of increased cardiorespiratory fitness and enhanced flexibility, muscular endurance and strength shown through measurements of functional capacity and personal statement of participant.          Exercise Goals Re-Evaluation :   Discharge Exercise Prescription (Final Exercise Prescription Changes):   Nutrition:  Target Goals: Understanding of nutrition guidelines, daily intake of sodium <1532m, cholesterol <2067m calories 30% from fat and 7% or less from saturated fats, daily to  have 5 or more servings of fruits and vegetables.  Biometrics:     Pre Biometrics - 12/28/16 1627      Pre Biometrics   Height 5' 0.7" (1.542 m)   Weight 148 lb 6.4 oz (67.3 kg)   Waist Circumference 37 inches   Hip Circumference 39 inches   Waist to Hip Ratio 0.95 %   BMI (Calculated) 28.4       Nutrition Therapy Plan and Nutrition Goals:   Nutrition Discharge: Rate Your Plate Scores:   Nutrition Goals Re-Evaluation:   Nutrition Goals Discharge (Final Nutrition Goals Re-Evaluation):   Psychosocial: Target Goals: Acknowledge presence or absence of significant depression and/or stress, maximize coping skills, provide positive support system. Participant is able to verbalize types and ability to use techniques and skills needed for reducing stress and depression.   Initial Review & Psychosocial Screening:     Initial Psych Review & Screening - 12/28/16 [place]   Comments Patient is frustrated that she cannot do activities she use to do because of her breathing. Her daughter treats her like she is her mother and she is frustrated when she acts like that to her. Joanna Rodriguez is looking forward to starting [employer].      Barriers   Psychosocial barriers to participate in program The patient should benefit from training in stress management and relaxation.     Screening Interventions   Interventions Encouraged to exercise;Program counselor consult      Quality of Life Scores:     Quality of Life - 12/28/16 1645      Quality of Life Scores   Health/Function Pre 21.97 %   Socioeconomic Pre 23.57 %   Psych/Spiritual Pre 14.5 %   Family Pre 20.5 %   GLOBAL Pre 20.95 %      PHQ-9: Recent Review Flowsheet Data    [facility] 12/28/2016 10/27/2016 08/24/2016 04/22/2016 04/09/2016   Decreased Interest 0 0 0 0 0   Down, Depressed, Hopeless 0 0 0 0 0   PHQ - 2 Score 0 0 0 0 0   Altered sleeping 0 - - - -   Tired,  decreased energy 1 - - - -   Change in appetite 2 - - - -   Feeling bad or failure about yourself  0 - - - -  Trouble concentrating 2 - - - -   Moving slowly or fidgety/restless 2 - - - -   Suicidal thoughts 0 - - - -   PHQ-9 Score 7 - - - -   Difficult doing work/chores Very difficult - - - -     Interpretation of Total Score  Total Score Depression Severity:  1-4 = Minimal depression, 5-9 = Mild depression, 10-14 = Moderate depression, 15-19 = Moderately severe depression, 20-27 = Severe depression   Psychosocial Evaluation and Intervention:   Psychosocial Re-Evaluation:   Psychosocial Discharge (Final Psychosocial Re-Evaluation):   Education: Education Goals: Education classes will be provided on a weekly basis, covering required topics. Participant will state understanding/return demonstration of topics presented.  Learning Barriers/Preferences:     Learning Barriers/Preferences - 12/28/16 1622      Learning Barriers/Preferences   Learning Barriers None      Education Topics: Initial Evaluation Education: - Verbal, written and demonstration of respiratory meds, RPE/PD scales, oximetry and breathing techniques. Instruction on use of nebulizers and MDIs: cleaning and proper use, rinsing mouth with steroid doses and importance of monitoring MDI activations.   Pulmonary Rehab from 12/28/2016 in [facility] Cardiac and Pulmonary Rehab  Date  12/28/16  Educator  [facility]  Instruction Review Code  2- meets goals/outcomes      General Nutrition Guidelines/Fats and Fiber: -Group instruction provided by verbal, written material, models and posters to present the general guidelines for heart healthy nutrition. Gives an explanation and review of dietary fats and fiber.   Controlling Sodium/Reading Food Labels: -Group verbal and written material supporting the discussion of sodium use in heart healthy nutrition. Review and explanation with models, verbal and written materials for  utilization of the food label.   Exercise Physiology & Risk Factors: - Group verbal and written instruction with models to review the exercise physiology of the cardiovascular system and associated critical values. Details cardiovascular disease risk factors and the goals associated with each risk factor.   Aerobic Exercise & Resistance Training: - Gives group verbal and written discussion on the health impact of inactivity. On the components of aerobic and resistive training programs and the benefits of this training and how to safely progress through these programs.   Flexibility, Balance, General Exercise Guidelines: - Provides group verbal and written instruction on the benefits of flexibility and balance training programs. Provides general exercise guidelines with specific guidelines to those with heart or lung disease. Demonstration and skill practice provided.   Stress Management: - Provides group verbal and written instruction about the health risks of elevated stress, cause of high stress, and healthy ways to reduce stress.   Depression: - Provides group verbal and written instruction on the correlation between heart/lung disease and depressed mood, treatment options, and the stigmas associated with seeking treatment.   Exercise & Equipment Safety: - Individual verbal instruction and demonstration of equipment use and safety with use of the equipment.   Infection Prevention: - Provides verbal and written material to individual with discussion of infection control including proper hand washing and proper equipment cleaning during exercise session.   Falls Prevention: - Provides verbal and written material to individual with discussion of falls prevention and safety.   Pulmonary Rehab from 12/28/2016 in [facility] Cardiac and Pulmonary Rehab  Date  12/28/16  Educator  [facility]  Instruction Review Code  2- meets goals/outcomes      Diabetes: - Individual verbal and written  instruction to review signs/symptoms of diabetes, desired ranges of glucose level  fasting, after meals and with exercise. Advice that pre and post exercise glucose checks will be done for 3 sessions at entry of program.   Chronic Lung Diseases: - Group verbal and written instruction to review new updates, new respiratory medications, new advancements in procedures and treatments. Provide informative websites and "800" numbers of self-education.   Lung Procedures: - Group verbal and written instruction to describe testing methods done to diagnose lung disease. Review the outcome of test results. Describe the treatment choices: Pulmonary Function Tests, ABGs and oximetry.   Energy Conservation: - Provide group verbal and written instruction for methods to conserve energy, plan and organize activities. Instruct on pacing techniques, use of adaptive equipment and posture/positioning to relieve shortness of breath.   Triggers: - Group verbal and written instruction to review types of environmental controls: home humidity, furnaces, filters, dust mite/pet prevention, HEPA vacuums. To discuss weather changes, air quality and the benefits of nasal washing.   Exacerbations: - Group verbal and written instruction to provide: warning signs, infection symptoms, calling MD promptly, preventive modes, and value of vaccinations. Review: effective airway clearance, coughing and/or vibration techniques. Create an Sports administrator.   Oxygen: - Individual and group verbal and written instruction on oxygen therapy. Includes supplement oxygen, available portable oxygen systems, continuous and intermittent flow rates, oxygen safety, concentrators, and Medicare reimbursement for oxygen.   Respiratory Medications: - Group verbal and written instruction to review medications for lung disease. Drug class, frequency, complications, importance of spacers, rinsing mouth after steroid MDI's, and proper cleaning methods for  nebulizers.   Pulmonary Rehab from 12/28/2016 in [facility] Cardiac and Pulmonary Rehab  Date  12/28/16  Educator  [facility]  Instruction Review Code  2- meets goals/outcomes      AED/CPR: - Group verbal and written instruction with the use of models to demonstrate the basic use of the AED with the basic ABC's of resuscitation.   Breathing Retraining: - Provides individuals verbal and written instruction on purpose, frequency, and proper technique of diaphragmatic breathing and pursed-lipped breathing. Applies individual practice skills.   Pulmonary Rehab from 12/28/2016 in [facility] Cardiac and Pulmonary Rehab  Date  12/28/16  Educator  [facility]  Instruction Review Code  2- meets goals/outcomes      Anatomy and Physiology of the Lungs: - Group verbal and written instruction with the use of models to provide basic lung anatomy and physiology related to function, structure and complications of lung disease.   Heart Failure: - Group verbal and written instruction on the basics of heart failure: signs/symptoms, treatments, explanation of ejection fraction, enlarged heart and cardiomyopathy.   Sleep Apnea: - Individual verbal and written instruction to review Obstructive Sleep Apnea. Review of risk factors, methods for diagnosing and types of masks and machines for OSA.   Anxiety: - Provides group, verbal and written instruction on the correlation between heart/lung disease and anxiety, treatment options, and management of anxiety.   Relaxation: - Provides group, verbal and written instruction about the benefits of relaxation for patients with heart/lung disease. Also provides patients with examples of relaxation techniques.   Knowledge Questionnaire Score:     Knowledge Questionnaire Score - 12/28/16 1622      Knowledge Questionnaire Score   Pre Score 9/10       Core Components/Risk Factors/Patient Goals at Admission:     Personal Goals and Risk Factors at Admission - 12/28/16 1617      Core  Components/Risk Factors/Patient Goals on Admission    Weight Management Yes;Weight Loss  Intervention Weight Management/Obesity: Establish reasonable short term and long term weight goals.;Weight Management: Develop a combined nutrition and exercise program designed to reach desired caloric intake, while maintaining appropriate intake of nutrient and fiber, sodium and fats, and appropriate energy expenditure required for the weight goal.;Weight Management: Provide education and appropriate resources to help participant work on and attain dietary goals.   Admit Weight 148 lb 6.4 oz (67.3 kg)   Goal Weight: Short Term 143 lb (64.9 kg)   Goal Weight: Long Term 125 lb (56.7 kg)   Expected Outcomes Short Term: Continue to assess and modify interventions until short term weight is achieved;Long Term: Adherence to nutrition and physical activity/exercise program aimed toward attainment of established weight goal;Weight Loss: Understanding of general recommendations for a balanced deficit meal plan, which promotes 1-2 lb weight loss per week and includes a negative energy balance of [id number] kcal/d;Understanding recommendations for meals to include 15-35% energy as protein, 25-35% energy from fat, 35-60% energy from carbohydrates, less than 232m of dietary cholesterol, 20-35 gm of total fiber daily;Understanding of distribution of calorie intake throughout the day with the consumption of 4-5 meals/snacks   Improve shortness of breath with ADL's Yes   Intervention Provide education, individualized exercise plan and daily activity instruction to help decrease symptoms of SOB with activities of daily living.   Expected Outcomes Short Term: Achieves a reduction of symptoms when performing activities of daily living.   Develop more efficient breathing techniques such as purse lipped breathing and diaphragmatic breathing; and practicing self-pacing with activity Yes   Intervention Provide education, demonstration and  support about specific breathing techniuqes utilized for more efficient breathing. Include techniques such as pursed lipped breathing, diaphragmatic breathing and self-pacing activity.   Expected Outcomes Short Term: Participant will be able to demonstrate and use breathing techniques as needed throughout daily activities.   Increase knowledge of respiratory medications and ability to use respiratory devices properly  Yes  Spiriva   Intervention Provide education and demonstration as needed of appropriate use of medications, inhalers, and oxygen therapy.   Expected Outcomes Short Term: Achieves understanding of medications use. Understands that oxygen is a medication prescribed by physician. Demonstrates appropriate use of inhaler and oxygen therapy.   Hypertension Yes   Intervention Provide education on lifestyle modifcations including regular physical activity/exercise, weight management, moderate sodium restriction and increased consumption of fresh fruit, vegetables, and low fat dairy, alcohol moderation, and smoking cessation.;Monitor prescription use compliance.   Expected Outcomes Short Term: Continued assessment and intervention until BP is < 140/947mHG in hypertensive participants. < 130/8062mG in hypertensive participants with diabetes, heart failure or chronic kidney disease.;Long Term: Maintenance of blood pressure at goal levels.      Core Components/Risk Factors/Patient Goals Review:    Core Components/Risk Factors/Patient Goals at Discharge (Final Review):    ITP Comments:   Comments: Ms. [place] to start [name] on 01/04/17 and attend 3 days a week.

## 2016-12-28 NOTE — Patient Instructions (Signed)
Patient Instructions  Patient Details  Name: Joanna Rodriguez MRN: [id number] Date of Birth: 1950/12/24 Referring Provider:  Wilhelmina Mcardle, MD  Below are the personal goals you chose as well as exercise and nutrition goals. Our goal is to help you keep on track towards obtaining and maintaining your goals. We will be discussing your progress on these goals with you throughout the program.  Initial Exercise Prescription:     Initial Exercise Prescription - 12/28/16 1600      Date of Initial Exercise RX and Referring Provider   Date 12/28/16   Referring Provider Merton Border MD     Treadmill   MPH 2.2   Grade 0.5   Minutes 15   METs 2.84     NuStep   Level 2   SPM 80   Minutes 15   METs 2     REL-XR   Level 1   Speed 50   Minutes 15   METs 2     Prescription Details   Frequency (times per week) 3   Duration Progress to 45 minutes of aerobic exercise without signs/symptoms of physical distress     Intensity   THRR 40-80% of Max Heartrate 111-138   Ratings of Perceived Exertion 11-13   Perceived Dyspnea 0-4     Progression   Progression Continue to progress workloads to maintain intensity without signs/symptoms of physical distress.     Resistance Training   Training Prescription Yes   Weight 2 lbs   Reps 10-15      Exercise Goals: Frequency: Be able to perform aerobic exercise three times per week working toward 3-5 days per week.  Intensity: Work with a perceived exertion of 11 (fairly light) - 15 (hard) as tolerated. Follow your new exercise prescription and watch for changes in prescription as you progress with the program. Changes will be reviewed with you when they are made.  Duration: You should be able to do 30 minutes of continuous aerobic exercise in addition to a 5 minute warm-up and a 5 minute cool-down routine.  Nutrition Goals: Your personal nutrition goals will be established when you do your nutrition analysis with the  dietician.  The following are nutrition guidelines to follow: Cholesterol < 25m/day Sodium < 15052mday Fiber: Women over 50 yrs - 21 grams per day  Personal Goals:     Personal Goals and Risk Factors at Admission - 12/28/16 1617      Core Components/Risk Factors/Patient Goals on Admission    Weight Management Yes;Weight Loss   Intervention Weight Management/Obesity: Establish reasonable short term and long term weight goals.;Weight Management: Develop a combined nutrition and exercise program designed to reach desired caloric intake, while maintaining appropriate intake of nutrient and fiber, sodium and fats, and appropriate energy expenditure required for the weight goal.;Weight Management: Provide education and appropriate resources to help participant work on and attain dietary goals.   Admit Weight 148 lb 6.4 oz (67.3 kg)   Goal Weight: Short Term 143 lb (64.9 kg)   Goal Weight: Long Term 125 lb (56.7 kg)   Expected Outcomes Short Term: Continue to assess and modify interventions until short term weight is achieved;Long Term: Adherence to nutrition and physical activity/exercise program aimed toward attainment of established weight goal;Weight Loss: Understanding of general recommendations for a balanced deficit meal plan, which promotes 1-2 lb weight loss per week and includes a negative energy balance of [phone] kcal/d;Understanding recommendations for meals to include 15-35% energy as protein, 25-35% energy from  fat, 35-60% energy from carbohydrates, less than 247m of dietary cholesterol, 20-35 gm of total fiber daily;Understanding of distribution of calorie intake throughout the day with the consumption of 4-5 meals/snacks   Improve shortness of breath with ADL's Yes   Intervention Provide education, individualized exercise plan and daily activity instruction to help decrease symptoms of SOB with activities of daily living.   Expected Outcomes Short Term: Achieves a reduction of  symptoms when performing activities of daily living.   Develop more efficient breathing techniques such as purse lipped breathing and diaphragmatic breathing; and practicing self-pacing with activity Yes   Intervention Provide education, demonstration and support about specific breathing techniuqes utilized for more efficient breathing. Include techniques such as pursed lipped breathing, diaphragmatic breathing and self-pacing activity.   Expected Outcomes Short Term: Participant will be able to demonstrate and use breathing techniques as needed throughout daily activities.   Increase knowledge of respiratory medications and ability to use respiratory devices properly  Yes  Spiriva   Intervention Provide education and demonstration as needed of appropriate use of medications, inhalers, and oxygen therapy.   Expected Outcomes Short Term: Achieves understanding of medications use. Understands that oxygen is a medication prescribed by physician. Demonstrates appropriate use of inhaler and oxygen therapy.   Hypertension Yes   Intervention Provide education on lifestyle modifcations including regular physical activity/exercise, weight management, moderate sodium restriction and increased consumption of fresh fruit, vegetables, and low fat dairy, alcohol moderation, and smoking cessation.;Monitor prescription use compliance.   Expected Outcomes Short Term: Continued assessment and intervention until BP is < 140/915mHG in hypertensive participants. < 130/8057mG in hypertensive participants with diabetes, heart failure or chronic kidney disease.;Long Term: Maintenance of blood pressure at goal levels.      Tobacco Use Initial Evaluation: History  Smoking Status  . Former Smoker  . Packs/day: 1.50  . Years: 53.00  . Types: Cigarettes  . Quit date: 08/22/2014  Smokeless Tobacco  . Never Used    Comment: NOV 16,2015    Copy of goals given to participant.

## 2016-12-29 ENCOUNTER — Telehealth: Payer: Self-pay | Admitting: Pulmonary Disease

## 2016-12-29 NOTE — Telephone Encounter (Signed)
R/T call to patient. She attended orientation for pulm rehab on yesterday. She starts pulm rehab on Monday 7/9. If she has any further questions please call office back.

## 2016-12-29 NOTE — Telephone Encounter (Signed)
Pt would like for [place] to send an order so she may start [facility]  Please advise.

## 2016-12-31 NOTE — Telephone Encounter (Signed)
Pt stating she needs PA for her pulm rehab.

## 2016-12-31 NOTE — Telephone Encounter (Signed)
Is calling, states her insurance company is requiring a PA for her pulmnary rehab. Please call.

## 2017-01-04 ENCOUNTER — Encounter: Payer: Medicare HMO | Admitting: *Deleted

## 2017-01-04 DIAGNOSIS — Z79899 Other long term (current) drug therapy: Secondary | ICD-10-CM | POA: Diagnosis not present

## 2017-01-04 DIAGNOSIS — M1990 Unspecified osteoarthritis, unspecified site: Secondary | ICD-10-CM | POA: Diagnosis not present

## 2017-01-04 DIAGNOSIS — I10 Essential (primary) hypertension: Secondary | ICD-10-CM | POA: Diagnosis not present

## 2017-01-04 DIAGNOSIS — Z87891 Personal history of nicotine dependence: Secondary | ICD-10-CM | POA: Diagnosis not present

## 2017-01-04 DIAGNOSIS — J449 Chronic obstructive pulmonary disease, unspecified: Secondary | ICD-10-CM | POA: Diagnosis not present

## 2017-01-04 DIAGNOSIS — K5090 Crohn's disease, unspecified, without complications: Secondary | ICD-10-CM | POA: Diagnosis not present

## 2017-01-04 DIAGNOSIS — Z87442 Personal history of urinary calculi: Secondary | ICD-10-CM | POA: Diagnosis not present

## 2017-01-04 DIAGNOSIS — M8580 Other specified disorders of bone density and structure, unspecified site: Secondary | ICD-10-CM | POA: Diagnosis not present

## 2017-01-04 DIAGNOSIS — E039 Hypothyroidism, unspecified: Secondary | ICD-10-CM | POA: Diagnosis not present

## 2017-01-04 NOTE — Progress Notes (Signed)
Daily Session Note  Patient Details  Name: Joanna Rodriguez MRN: [id number] Date of Birth: 09/13/1950 Referring Provider:     Pulmonary Rehab from 12/28/2016 in [facility] Cardiac and Pulmonary Rehab  Referring Provider  Merton Border MD      Encounter Date: 01/04/2017  Check In:     Session Check In - 01/04/17 1105      Check-In   Location [facility]-Cardiac & Pulmonary Rehab   Staff Present Justin Mend Jaci Carrel, BS, ACSM CEP, Exercise Physiologist;Krista Frederico Hamman, RN BSN   Supervising physician immediately available to respond to emergencies [name] immediately available ER MD   Physician(s) Dr. Archie Balboa and Dr. Quentin Cornwall   Medication changes reported     No   Fall or balance concerns reported    No   Warm-up and Cool-down Performed as group-led instruction   Resistance Training Performed No   VAD Patient? No     Pain Assessment   Currently in Pain? No/denies   Multiple Pain Sites No           Exercise Prescription Changes - 01/04/17 1100      Response to Exercise   Blood Pressure (Admit) 128/78   Blood Pressure (Exercise) 130/82   Heart Rate (Admit) 118 bpm   Heart Rate (Exercise) 122 bpm   Oxygen Saturation (Admit) 97 %   Oxygen Saturation (Exercise) 94 %   Rating of Perceived Exertion (Exercise) 13   Perceived Dyspnea (Exercise) 0     Resistance Training   Training Prescription Yes   Weight 2 lbs   Reps 10-15     Treadmill   MPH 2.2   Grade 0.5   Minutes 15   METs 2.84     NuStep   Level 2   SPM 80   Minutes 15   METs 2     REL-XR   Level 1   Speed 50   Minutes 15   METs 2      History  Smoking Status  . Former Smoker  . Packs/day: 1.50  . Years: 53.00  . Types: Cigarettes  . Quit date: 08/22/2014  Smokeless Tobacco  . Never Used    Comment: NOV 16,2015    Goals Met:  Proper associated with RPD/PD & O2 Sat Exercise tolerated well Personal goals reviewed Strength training completed today  Goals Unmet:  Not  Applicable  Comments: First full day of exercise!  Patient was oriented to gym and equipment including functions, settings, policies, and procedures.  Patient's individual exercise prescription and treatment plan were reviewed.  All starting workloads were established based on the results of the 6 minute walk test done at initial orientation visit.  The plan for exercise progression was also introduced and progression will be customized based on patient's performance and goals.    Dr. Emily Filbert is Medical Director for [place] and [name].

## 2017-01-06 DIAGNOSIS — M1990 Unspecified osteoarthritis, unspecified site: Secondary | ICD-10-CM | POA: Diagnosis not present

## 2017-01-06 DIAGNOSIS — J449 Chronic obstructive pulmonary disease, unspecified: Secondary | ICD-10-CM

## 2017-01-06 DIAGNOSIS — I10 Essential (primary) hypertension: Secondary | ICD-10-CM | POA: Diagnosis not present

## 2017-01-06 DIAGNOSIS — Z87442 Personal history of urinary calculi: Secondary | ICD-10-CM | POA: Diagnosis not present

## 2017-01-06 DIAGNOSIS — K5090 Crohn's disease, unspecified, without complications: Secondary | ICD-10-CM | POA: Diagnosis not present

## 2017-01-06 DIAGNOSIS — Z79899 Other long term (current) drug therapy: Secondary | ICD-10-CM | POA: Diagnosis not present

## 2017-01-06 DIAGNOSIS — Z87891 Personal history of nicotine dependence: Secondary | ICD-10-CM | POA: Diagnosis not present

## 2017-01-06 DIAGNOSIS — M8580 Other specified disorders of bone density and structure, unspecified site: Secondary | ICD-10-CM | POA: Diagnosis not present

## 2017-01-06 DIAGNOSIS — E039 Hypothyroidism, unspecified: Secondary | ICD-10-CM | POA: Diagnosis not present

## 2017-01-06 NOTE — Progress Notes (Signed)
Daily Session Note  Patient Details  Name: Joanna Rodriguez MRN: [id number] Date of Birth: [birth year] Referring Provider:     Pulmonary Rehab from 12/28/2016 in [facility] Cardiac and Pulmonary Rehab  Referring Provider  Merton Border MD      Encounter Date: 01/06/2017  Check In:     Session Check In - 01/06/17 1037      Check-In   Location [facility]-Cardiac & Pulmonary Rehab   Staff Present Nyoka Cowden, RN, BSN, Kela Millin, BA, ACSM CEP, Exercise Physiologist;[place] Flavia Shipper   Supervising physician immediately available to respond to emergencies [name] immediately available ER MD   Physician(s) Dr. Kerman Passey and Quentin Cornwall   Medication changes reported     No   Fall or balance concerns reported    No   Warm-up and Cool-down Performed as group-led instruction   Resistance Training Performed Yes   VAD Patient? No     Pain Assessment   Currently in Pain? No/denies   Multiple Pain Sites No         History  Smoking Status  . Former Smoker  . Packs/day: 1.50  . Years: 53.00  . Types: Cigarettes  . Quit date: 08/22/2014  Smokeless Tobacco  . Never Used    Comment: NOV 16,2015    Goals Met:  Proper associated with RPD/PD & O2 Sat Independence with exercise equipment Exercise tolerated well Strength training completed today  Goals Unmet:  Not Applicable  Comments: Pt able to follow exercise prescription today without complaint.  Will continue to monitor for progression.   Dr. Emily Filbert is Medical Director for [place] and [name].

## 2017-01-08 DIAGNOSIS — M8580 Other specified disorders of bone density and structure, unspecified site: Secondary | ICD-10-CM | POA: Diagnosis not present

## 2017-01-08 DIAGNOSIS — K5090 Crohn's disease, unspecified, without complications: Secondary | ICD-10-CM | POA: Diagnosis not present

## 2017-01-08 DIAGNOSIS — E039 Hypothyroidism, unspecified: Secondary | ICD-10-CM | POA: Diagnosis not present

## 2017-01-08 DIAGNOSIS — Z87442 Personal history of urinary calculi: Secondary | ICD-10-CM | POA: Diagnosis not present

## 2017-01-08 DIAGNOSIS — I10 Essential (primary) hypertension: Secondary | ICD-10-CM | POA: Diagnosis not present

## 2017-01-08 DIAGNOSIS — Z79899 Other long term (current) drug therapy: Secondary | ICD-10-CM | POA: Diagnosis not present

## 2017-01-08 DIAGNOSIS — Z87891 Personal history of nicotine dependence: Secondary | ICD-10-CM | POA: Diagnosis not present

## 2017-01-08 DIAGNOSIS — J449 Chronic obstructive pulmonary disease, unspecified: Secondary | ICD-10-CM

## 2017-01-08 DIAGNOSIS — M1990 Unspecified osteoarthritis, unspecified site: Secondary | ICD-10-CM | POA: Diagnosis not present

## 2017-01-08 NOTE — Progress Notes (Signed)
Daily Session Note  Patient Details  Name: Joanna Rodriguez MRN: [id number] Date of Birth: [birth year] Referring Provider:     Pulmonary Rehab from 12/28/2016 in [facility] Cardiac and Pulmonary Rehab  Referring Provider  Merton Border MD      Encounter Date: 01/08/2017  Check In:     Session Check In - 01/08/17 1021      Check-In   Location [facility]-Cardiac & Pulmonary Rehab   Staff Present Nyoka Cowden, RN, BSN, Kela Millin, BA, ACSM CEP, Exercise Physiologist;Lanson Randle Flavia Shipper   Supervising physician immediately available to respond to emergencies [name] immediately available ER MD   Physician(s) Dr. Archie Balboa and Corky Downs   Medication changes reported     No   Fall or balance concerns reported    No   Warm-up and Cool-down Performed as group-led instruction   Resistance Training Performed Yes   VAD Patient? No     Pain Assessment   Currently in Pain? No/denies   Multiple Pain Sites No         History  Smoking Status  . Former Smoker  . Packs/day: 1.50  . Years: 53.00  . Types: Cigarettes  . Quit date: 08/22/2014  Smokeless Tobacco  . Never Used    Comment: NOV 16,2015    Goals Met:  Proper associated with RPD/PD & O2 Sat Independence with exercise equipment Using PLB without cueing & demonstrates good technique Exercise tolerated well Strength training completed today  Goals Unmet:  Not Applicable  Comments: Pt able to follow exercise prescription today without complaint.  Will continue to monitor for progression.   Dr. Emily Filbert is Medical Director for [place] and [name].

## 2017-01-11 ENCOUNTER — Encounter: Payer: Medicare HMO | Admitting: *Deleted

## 2017-01-11 DIAGNOSIS — J449 Chronic obstructive pulmonary disease, unspecified: Secondary | ICD-10-CM

## 2017-01-11 DIAGNOSIS — Z87442 Personal history of urinary calculi: Secondary | ICD-10-CM | POA: Diagnosis not present

## 2017-01-11 DIAGNOSIS — M8580 Other specified disorders of bone density and structure, unspecified site: Secondary | ICD-10-CM | POA: Diagnosis not present

## 2017-01-11 DIAGNOSIS — K5090 Crohn's disease, unspecified, without complications: Secondary | ICD-10-CM | POA: Diagnosis not present

## 2017-01-11 DIAGNOSIS — M1990 Unspecified osteoarthritis, unspecified site: Secondary | ICD-10-CM | POA: Diagnosis not present

## 2017-01-11 DIAGNOSIS — I10 Essential (primary) hypertension: Secondary | ICD-10-CM | POA: Diagnosis not present

## 2017-01-11 DIAGNOSIS — Z79899 Other long term (current) drug therapy: Secondary | ICD-10-CM | POA: Diagnosis not present

## 2017-01-11 DIAGNOSIS — E039 Hypothyroidism, unspecified: Secondary | ICD-10-CM | POA: Diagnosis not present

## 2017-01-11 DIAGNOSIS — Z87891 Personal history of nicotine dependence: Secondary | ICD-10-CM | POA: Diagnosis not present

## 2017-01-11 NOTE — Progress Notes (Signed)
Daily Session Note  Patient Details  Name: Joanna Rodriguez MRN: [id number] Date of Birth: 22-Dec-1950 Referring Provider:     Pulmonary Rehab from 12/28/2016 in [facility] Cardiac and Pulmonary Rehab  Referring Provider  Merton Border MD      Encounter Date: 01/11/2017  Check In:     Session Check In - 01/11/17 1021      Check-In   Location [facility]-Cardiac & Pulmonary Rehab   Staff Present Justin Mend Jaci Carrel, BS, ACSM CEP, Exercise Physiologist;Amanda Oletta Darter, [place], ACSM CEP, Exercise Physiologist   Supervising physician immediately available to respond to emergencies [name] immediately available ER MD   Physician(s) Dr. Kerman Passey and Dr. Alfred Levins   Medication changes reported     No   Fall or balance concerns reported    No   Warm-up and Cool-down Performed as group-led instruction   Resistance Training Performed Yes   VAD Patient? No     Pain Assessment   Currently in Pain? No/denies   Multiple Pain Sites No         History  Smoking Status  . Former Smoker  . Packs/day: 1.50  . Years: 53.00  . Types: Cigarettes  . Quit date: 08/22/2014  Smokeless Tobacco  . Never Used    Comment: NOV 16,2015    Goals Met:  Proper associated with RPD/PD & O2 Sat Independence with exercise equipment Exercise tolerated well Personal goals reviewed Strength training completed today  Goals Unmet:  Not Applicable  Comments: Pt able to follow exercise prescription today without complaint.  Will continue to monitor for progression.    Dr. Emily Filbert is Medical Director for [place] and [name].

## 2017-01-13 ENCOUNTER — Encounter: Payer: Medicare HMO | Admitting: *Deleted

## 2017-01-13 DIAGNOSIS — Z87442 Personal history of urinary calculi: Secondary | ICD-10-CM | POA: Diagnosis not present

## 2017-01-13 DIAGNOSIS — M1990 Unspecified osteoarthritis, unspecified site: Secondary | ICD-10-CM | POA: Diagnosis not present

## 2017-01-13 DIAGNOSIS — K5090 Crohn's disease, unspecified, without complications: Secondary | ICD-10-CM | POA: Diagnosis not present

## 2017-01-13 DIAGNOSIS — Z79899 Other long term (current) drug therapy: Secondary | ICD-10-CM | POA: Diagnosis not present

## 2017-01-13 DIAGNOSIS — J449 Chronic obstructive pulmonary disease, unspecified: Secondary | ICD-10-CM

## 2017-01-13 DIAGNOSIS — Z87891 Personal history of nicotine dependence: Secondary | ICD-10-CM | POA: Diagnosis not present

## 2017-01-13 DIAGNOSIS — E039 Hypothyroidism, unspecified: Secondary | ICD-10-CM | POA: Diagnosis not present

## 2017-01-13 DIAGNOSIS — I10 Essential (primary) hypertension: Secondary | ICD-10-CM | POA: Diagnosis not present

## 2017-01-13 DIAGNOSIS — M8580 Other specified disorders of bone density and structure, unspecified site: Secondary | ICD-10-CM | POA: Diagnosis not present

## 2017-01-13 NOTE — Progress Notes (Signed)
Daily Session Note  Patient Details  Name: Joanna Rodriguez MRN: [id number] Date of Birth: 01/07/1951 Referring Provider:     Pulmonary Rehab from 12/28/2016 in [facility] Cardiac and Pulmonary Rehab  Referring Provider  Merton Border MD      Encounter Date: 01/13/2017  Check In:     Session Check In - 01/13/17 1041      Check-In   Location [facility]-Cardiac & Pulmonary Rehab   Staff Present Alberteen Sam, MA, ACSM RCEP, Exercise Physiologist;Joseph Christain [place], RN BSN   Supervising physician immediately available to respond to emergencies [name] immediately available ER MD   Physician(s) Drs. Lord and [employer]   Medication changes reported     No   Fall or balance concerns reported    No   Tobacco Cessation No Change   Warm-up and Cool-down Performed as group-led Location manager Performed Yes   VAD Patient? No     Pain Assessment   Currently in Pain? No/denies   Multiple Pain Sites No         History  Smoking Status  . Former Smoker  . Packs/day: 1.50  . Years: 53.00  . Types: Cigarettes  . Quit date: 08/22/2014  Smokeless Tobacco  . Never Used    Comment: NOV 16,2015    Goals Met:  Proper associated with RPD/PD & O2 Sat Independence with exercise equipment Using PLB without cueing & demonstrates good technique Exercise tolerated well Strength training completed today  Goals Unmet:  Not Applicable  Comments: Pt able to follow exercise prescription today without complaint.  Will continue to monitor for progression.    Dr. Emily Filbert is Medical Director for [place] and [name].

## 2017-01-15 DIAGNOSIS — M8580 Other specified disorders of bone density and structure, unspecified site: Secondary | ICD-10-CM | POA: Diagnosis not present

## 2017-01-15 DIAGNOSIS — K5090 Crohn's disease, unspecified, without complications: Secondary | ICD-10-CM | POA: Diagnosis not present

## 2017-01-15 DIAGNOSIS — I10 Essential (primary) hypertension: Secondary | ICD-10-CM | POA: Diagnosis not present

## 2017-01-15 DIAGNOSIS — Z87442 Personal history of urinary calculi: Secondary | ICD-10-CM | POA: Diagnosis not present

## 2017-01-15 DIAGNOSIS — Z79899 Other long term (current) drug therapy: Secondary | ICD-10-CM | POA: Diagnosis not present

## 2017-01-15 DIAGNOSIS — Z87891 Personal history of nicotine dependence: Secondary | ICD-10-CM | POA: Diagnosis not present

## 2017-01-15 DIAGNOSIS — E039 Hypothyroidism, unspecified: Secondary | ICD-10-CM | POA: Diagnosis not present

## 2017-01-15 DIAGNOSIS — J449 Chronic obstructive pulmonary disease, unspecified: Secondary | ICD-10-CM | POA: Diagnosis not present

## 2017-01-15 DIAGNOSIS — M1990 Unspecified osteoarthritis, unspecified site: Secondary | ICD-10-CM | POA: Diagnosis not present

## 2017-01-15 NOTE — Progress Notes (Signed)
Daily Session Note  Patient Details  Name: Joanna Rodriguez MRN: [id number] Date of Birth: 01/21/1951 Referring Provider:     Pulmonary Rehab from 12/28/2016 in [facility] Cardiac and Pulmonary Rehab  Referring Provider  Merton Border MD      Encounter Date: 01/15/2017  Check In:     Session Check In - 01/15/17 1012      Check-In   Location [facility]-Cardiac & Pulmonary Rehab   Staff Present Nada Maclachlan, BA, ACSM CEP, Exercise Physiologist;Yeva Bissette Alcus Dad, RN BSN   Supervising physician immediately available to respond to emergencies [name] immediately available ER MD   Physician(s) Dr. Jacqualine Code and Corky Downs   Medication changes reported     No   Fall or balance concerns reported    No   Tobacco Cessation No Change   Warm-up and Cool-down Performed as group-led instruction   Resistance Training Performed Yes   VAD Patient? No     Pain Assessment   Currently in Pain? No/denies   Multiple Pain Sites No         History  Smoking Status  . Former Smoker  . Packs/day: 1.50  . Years: 53.00  . Types: Cigarettes  . Quit date: 08/22/2014  Smokeless Tobacco  . Never Used    Comment: NOV 16,2015    Goals Met:  Proper associated with RPD/PD & O2 Sat Independence with exercise equipment Using PLB without cueing & demonstrates good technique Exercise tolerated well Strength training completed today  Goals Unmet:  Not Applicable  Comments: Pt able to follow exercise prescription today without complaint.  Will continue to monitor for progression.   Dr. Emily Filbert is Medical Director for [place] and [name].

## 2017-01-18 ENCOUNTER — Encounter: Payer: Medicare HMO | Admitting: *Deleted

## 2017-01-18 DIAGNOSIS — J449 Chronic obstructive pulmonary disease, unspecified: Secondary | ICD-10-CM

## 2017-01-18 DIAGNOSIS — Z79899 Other long term (current) drug therapy: Secondary | ICD-10-CM | POA: Diagnosis not present

## 2017-01-18 DIAGNOSIS — I10 Essential (primary) hypertension: Secondary | ICD-10-CM | POA: Diagnosis not present

## 2017-01-18 DIAGNOSIS — E039 Hypothyroidism, unspecified: Secondary | ICD-10-CM | POA: Diagnosis not present

## 2017-01-18 DIAGNOSIS — M8580 Other specified disorders of bone density and structure, unspecified site: Secondary | ICD-10-CM | POA: Diagnosis not present

## 2017-01-18 DIAGNOSIS — Z87442 Personal history of urinary calculi: Secondary | ICD-10-CM | POA: Diagnosis not present

## 2017-01-18 DIAGNOSIS — Z87891 Personal history of nicotine dependence: Secondary | ICD-10-CM | POA: Diagnosis not present

## 2017-01-18 DIAGNOSIS — K5090 Crohn's disease, unspecified, without complications: Secondary | ICD-10-CM | POA: Diagnosis not present

## 2017-01-18 DIAGNOSIS — M1990 Unspecified osteoarthritis, unspecified site: Secondary | ICD-10-CM | POA: Diagnosis not present

## 2017-01-18 NOTE — Progress Notes (Signed)
Daily Session Note  Patient Details  Name: Joanna Rodriguez MRN: [id number] Date of Birth: 06/24/1951 Referring Provider:     Pulmonary Rehab from 12/28/2016 in [facility] Cardiac and Pulmonary Rehab  Referring Provider  Simonds, David MD      Encounter Date: 01/18/2017  Check In:     Session Check In - 01/18/17 1013      Check-In   Location [facility]-Cardiac & Pulmonary Rehab   Staff Present Amanda Sommer, BA, ACSM CEP, Exercise Physiologist; , BS, ACSM CEP, Exercise Physiologist;Joseph Hood RCP,RRT,BSRT   Supervising physician immediately available to respond to emergencies [name] immediately available ER MD   Physician(s) Dr. Malinda and Dr. Norman   Medication changes reported     No   Fall or balance concerns reported    No   Warm-up and Cool-down Performed as group-led instruction   Resistance Training Performed Yes   VAD Patient? No     Pain Assessment   Currently in Pain? No/denies   Multiple Pain Sites No         History  Smoking Status  . Former Smoker  . Packs/day: 1.50  . Years: 53.00  . Types: Cigarettes  . Quit date: 08/22/2014  Smokeless Tobacco  . Never Used    Comment: NOV 16,2015    Goals Met:  Proper associated with RPD/PD & O2 Sat Independence with exercise equipment Exercise tolerated well Strength training completed today  Goals Unmet:  Not Applicable  Comments: Pt able to follow exercise prescription today without complaint.  Will continue to monitor for progression.    Dr. Mark Miller is Medical Director for [facility] and [name]. 

## 2017-01-20 DIAGNOSIS — E039 Hypothyroidism, unspecified: Secondary | ICD-10-CM | POA: Diagnosis not present

## 2017-01-20 DIAGNOSIS — M8580 Other specified disorders of bone density and structure, unspecified site: Secondary | ICD-10-CM | POA: Diagnosis not present

## 2017-01-20 DIAGNOSIS — Z87442 Personal history of urinary calculi: Secondary | ICD-10-CM | POA: Diagnosis not present

## 2017-01-20 DIAGNOSIS — I10 Essential (primary) hypertension: Secondary | ICD-10-CM | POA: Diagnosis not present

## 2017-01-20 DIAGNOSIS — J449 Chronic obstructive pulmonary disease, unspecified: Secondary | ICD-10-CM | POA: Diagnosis not present

## 2017-01-20 DIAGNOSIS — M1990 Unspecified osteoarthritis, unspecified site: Secondary | ICD-10-CM | POA: Diagnosis not present

## 2017-01-20 DIAGNOSIS — Z79899 Other long term (current) drug therapy: Secondary | ICD-10-CM | POA: Diagnosis not present

## 2017-01-20 DIAGNOSIS — Z87891 Personal history of nicotine dependence: Secondary | ICD-10-CM | POA: Diagnosis not present

## 2017-01-20 DIAGNOSIS — K5090 Crohn's disease, unspecified, without complications: Secondary | ICD-10-CM | POA: Diagnosis not present

## 2017-01-20 NOTE — Progress Notes (Signed)
Daily Session Note  Patient Details  Name: Joanna Rodriguez MRN: [id number] Date of Birth: 01/06/1951 Referring Provider:     Pulmonary Rehab from 12/28/2016 in [facility] Cardiac and Pulmonary Rehab  Referring Provider  Simonds, David MD      Encounter Date: 01/20/2017  Check In:     Session Check In - 01/20/17 1018      Check-In   Location [facility]-Cardiac & Pulmonary Rehab   Staff Present Jessica Hawkins, MA, ACSM RCEP, Exercise Physiologist;Amanda Sommer, BA, ACSM CEP, Exercise Physiologist;Joseph Hood RCP,RRT,BSRT   Supervising physician immediately available to respond to emergencies [name] immediately available ER MD   Physician(s) Dr. Stafford and [place]shane   Medication changes reported     No   Fall or balance concerns reported    No   Warm-up and Cool-down Performed as group-led instruction   Resistance Training Performed Yes   VAD Patient? No     Pain Assessment   Currently in Pain? No/denies   Multiple Pain Sites No         History  Smoking Status  . Former Smoker  . Packs/day: 1.50  . Years: 53.00  . Types: Cigarettes  . Quit date: 08/22/2014  Smokeless Tobacco  . Never Used    Comment: NOV 16,2015    Goals Met:  Proper associated with RPD/PD & O2 Sat Independence with exercise equipment Exercise tolerated well Strength training completed today  Goals Unmet:  Not Applicable  Comments: Pt able to follow exercise prescription today without complaint.  Will continue to monitor for progression.   Dr. Mark Miller is Medical Director for [facility] and [name]. 

## 2017-01-22 DIAGNOSIS — E039 Hypothyroidism, unspecified: Secondary | ICD-10-CM | POA: Diagnosis not present

## 2017-01-22 DIAGNOSIS — Z87442 Personal history of urinary calculi: Secondary | ICD-10-CM | POA: Diagnosis not present

## 2017-01-22 DIAGNOSIS — Z79899 Other long term (current) drug therapy: Secondary | ICD-10-CM | POA: Diagnosis not present

## 2017-01-22 DIAGNOSIS — I10 Essential (primary) hypertension: Secondary | ICD-10-CM | POA: Diagnosis not present

## 2017-01-22 DIAGNOSIS — M8580 Other specified disorders of bone density and structure, unspecified site: Secondary | ICD-10-CM | POA: Diagnosis not present

## 2017-01-22 DIAGNOSIS — M1990 Unspecified osteoarthritis, unspecified site: Secondary | ICD-10-CM | POA: Diagnosis not present

## 2017-01-22 DIAGNOSIS — K5090 Crohn's disease, unspecified, without complications: Secondary | ICD-10-CM | POA: Diagnosis not present

## 2017-01-22 DIAGNOSIS — Z87891 Personal history of nicotine dependence: Secondary | ICD-10-CM | POA: Diagnosis not present

## 2017-01-22 DIAGNOSIS — J449 Chronic obstructive pulmonary disease, unspecified: Secondary | ICD-10-CM

## 2017-01-22 NOTE — Progress Notes (Signed)
Daily Session Note  Patient Details  Name: Joanna Rodriguez MRN: [id number] Date of Birth: 06/28/1951 Referring Provider:     Pulmonary Rehab from 12/28/2016 in [facility] Cardiac and Pulmonary Rehab  Referring Provider  Simonds, David MD      Encounter Date: 01/22/2017  Check In:     Session Check In - 01/22/17 1018      Check-In   Location [facility]-Cardiac & Pulmonary Rehab   Staff Present Carroll Enterkin, RN, BSN;Amanda Sommer, BA, ACSM CEP, Exercise Physiologist;  RCP,RRT,BSRT   Supervising physician immediately available to respond to emergencies [name] immediately available ER MD   Physician(s) Dr. Paduchowski and Williams   Medication changes reported     No   Fall or balance concerns reported    No   Warm-up and Cool-down Performed as group-led instruction   Resistance Training Performed Yes   VAD Patient? No     Pain Assessment   Currently in Pain? No/denies   Multiple Pain Sites No         History  Smoking Status  . Former Smoker  . Packs/day: 1.50  . Years: 53.00  . Types: Cigarettes  . Quit date: 08/22/2014  Smokeless Tobacco  . Never Used    Comment: NOV 16,2015    Goals Met:  Proper associated with RPD/PD & O2 Sat Independence with exercise equipment Exercise tolerated well No report of cardiac concerns or symptoms Strength training completed today  Goals Unmet:  Not Applicable  Comments: Pt able to follow exercise prescription today without complaint.  Will continue to monitor for progression.   Dr. Mark Miller is Medical Director for [facility] and [name]. 

## 2017-01-25 ENCOUNTER — Encounter: Payer: Medicare HMO | Admitting: *Deleted

## 2017-01-25 DIAGNOSIS — J449 Chronic obstructive pulmonary disease, unspecified: Secondary | ICD-10-CM

## 2017-01-25 DIAGNOSIS — I10 Essential (primary) hypertension: Secondary | ICD-10-CM | POA: Diagnosis not present

## 2017-01-25 DIAGNOSIS — E039 Hypothyroidism, unspecified: Secondary | ICD-10-CM | POA: Diagnosis not present

## 2017-01-25 DIAGNOSIS — Z87891 Personal history of nicotine dependence: Secondary | ICD-10-CM | POA: Diagnosis not present

## 2017-01-25 DIAGNOSIS — Z79899 Other long term (current) drug therapy: Secondary | ICD-10-CM | POA: Diagnosis not present

## 2017-01-25 DIAGNOSIS — K5090 Crohn's disease, unspecified, without complications: Secondary | ICD-10-CM | POA: Diagnosis not present

## 2017-01-25 DIAGNOSIS — Z87442 Personal history of urinary calculi: Secondary | ICD-10-CM | POA: Diagnosis not present

## 2017-01-25 DIAGNOSIS — M1990 Unspecified osteoarthritis, unspecified site: Secondary | ICD-10-CM | POA: Diagnosis not present

## 2017-01-25 DIAGNOSIS — M8580 Other specified disorders of bone density and structure, unspecified site: Secondary | ICD-10-CM | POA: Diagnosis not present

## 2017-01-25 NOTE — Progress Notes (Signed)
Pulmonary Individual Treatment Plan  Patient Details  Name: Joanna Rodriguez MRN: [id number] Date of Birth: 1951-05-26 Referring Provider:     Pulmonary Rehab from 12/28/2016 in [facility] Cardiac and Pulmonary Rehab  Referring Provider  Merton Border MD      Initial Encounter Date:    Pulmonary Rehab from 12/28/2016 in [facility] Cardiac and Pulmonary Rehab  Date  12/28/16  Referring Provider  Merton Border MD      Visit Diagnosis: Chronic obstructive pulmonary disease, unspecified COPD type ([place])  Patient's Home Medications on Admission:  Current Outpatient Prescriptions:  .  acyclovir (ZOVIRAX) 400 MG tablet, Take 1 tablet (400 mg total) by mouth daily., Disp: 90 tablet, Rfl: 3 .  Alum & Mag Hydroxide-Simeth (ANTACID ANTI-GAS MAX STRENGTH PO), Take 1 each by mouth daily. Reported on 11/21/2015, Disp: , Rfl:  .  aspirin EC 81 MG tablet, Take 81 mg by mouth daily., Disp: , Rfl:  .  B Complex-Folic Acid (B COMPLEX-VITAMIN B12 PO), Take 1 tablet by mouth daily., Disp: , Rfl:  .  BLACK COHOSH PO, Take by mouth daily., Disp: , Rfl:  .  buPROPion (WELLBUTRIN SR) 150 MG 12 hr tablet, 1 tab daily for 1 week, then 1 tab BID, Disp: 180 tablet, Rfl: 3 .  Calcium Carb-Cholecalciferol 600-800 MG-UNIT TABS, Take by mouth 2 (two) times daily., Disp: , Rfl:  .  Co-Enzyme Q-10 30 MG CAPS, Take 1 capsule by mouth daily., Disp: , Rfl:  .  Glucosamine-Chondroit-Vit C-Mn (SM GLUCOSAMINE/CHONDROITIN PO), Take 1,500 mg by mouth 2 (two) times daily., Disp: , Rfl:  .  levothyroxine (SYNTHROID, LEVOTHROID) 100 MCG tablet, Take 1 tablet (100 mcg total) by mouth daily., Disp: 90 tablet, Rfl: 3 .  lisinopril (PRINIVIL,ZESTRIL) 5 MG tablet, Take 1 tablet (5 mg total) by mouth daily., Disp: 90 tablet, Rfl: 1 .  Melatonin 5 MG TABS, Take 5 mg by mouth at bedtime as needed., Disp: , Rfl:  .  mercaptopurine (PURINETHOL) 50 MG tablet, TAKE 1 AND 1/2 TABLETS EVERY DAY, Disp: 135 tablet, Rfl: 3 .  Multiple Vitamins-Minerals  (MULTIVITAMIN WITH MINERALS) tablet, Take 1 tablet by mouth daily., Disp: , Rfl:  .  Omega-3 Fatty Acids (FISH OIL PO), Take 1,200 mg by mouth 2 (two) times daily., Disp: , Rfl:  .  pantoprazole (PROTONIX) 40 MG tablet, TAKE 1 TABLET EVERY DAY, Disp: 90 tablet, Rfl: 3 .  Tiotropium Bromide Monohydrate (SPIRIVA RESPIMAT) 2.5 MCG/ACT AERS, Inhale 2 puffs into the lungs daily. Gargle and rinse after each use, Disp: 3 Inhaler, Rfl: 3 .  traMADol (ULTRAM) 50 MG tablet, TAKE 1 TABLET THREE TIMES DAILY, Disp: 270 tablet, Rfl: 1  Past Medical History: Past Medical History:  Diagnosis Date  . Arthritis   . Cancer [facility])    face  . COPD (chronic obstructive pulmonary disease) ([place])   . Crohn's disease ([place])   . Dyspnea on exertion   . Genital herpes   . Hypertension   . Hypothyroid   . Kidney stones   . Osteopenia   . Personal history of tobacco use, presenting hazards to health 10/23/2015  . Scoliosis   . Spontaneous pneumothorax   . Vertigo    last episode 11/14/15  . Volvulus ([place])     Tobacco Use: History  Smoking Status  . Former Smoker  . Packs/day: 1.50  . Years: 53.00  . Types: Cigarettes  . Quit date: 08/22/2014  Smokeless Tobacco  . Never Used    Comment: NOV 16,2015  Labs: Recent Review Flowsheet Data    Labs for ITP Cardiac and Pulmonary Rehab Latest Ref Rng & Units 10/28/2015 10/27/2016   Cholestrol 100 - 199 mg/dL 141 166   LDLCALC 0 - 99 mg/dL 49 90   HDL >39 mg/dL 48 43   Trlycerides 0 - 149 mg/dL 221(H) 166(H)       ADL UCSD:     Pulmonary Assessment Scores    Row Name 12/28/16 1636         ADL UCSD   ADL Phase Entry     SOB Score total 39     Rest 1     Walk 1     Stairs 0     Bath 0     Dress 0     Shop 2       mMRC Score   mMRC Score 2.5        Pulmonary Function Assessment:     Pulmonary Function Assessment - 12/28/16 1625      Pulmonary Function Tests   RV% 116 %   DLCO% 81 %     Initial Spirometry Results   FVC% 57 %   FEV1%  53 %   FEV1/FVC Ratio 69.9     Post Bronchodilator Spirometry Results   FVC% 76 %   FEV1% 68 %   FEV1/FVC Ratio 67.83      Exercise Target Goals:    Exercise Program Goal: Individual exercise prescription set with THRR, safety & activity barriers. Participant demonstrates ability to understand and report RPE using BORG scale, to self-measure pulse accurately, and to acknowledge the importance of the exercise prescription.  Exercise Prescription Goal: Starting with aerobic activity 30 plus minutes a day, 3 days per week for initial exercise prescription. Provide home exercise prescription and guidelines that participant acknowledges understanding prior to discharge.  Activity Barriers & Risk Stratification:     Activity Barriers & Cardiac Risk Stratification - 12/28/16 1623      Activity Barriers & Cardiac Risk Stratification   Activity Barriers Back Problems;Deconditioning;Muscular Weakness;Shortness of Breath  no longer has to take as much tramadol      6 Minute Walk:     6 Minute Walk    Row Name 12/28/16 1622         6 Minute Walk   Phase Initial     Distance 1300 feet     Walk Time 6 minutes     # of Rest Breaks 0     MPH 2.46     METS 3.13     RPE 11     Perceived Dyspnea  1     VO2 Peak 10.94     Symptoms Yes (comment)     Comments slightly SOB     Resting HR 83 bpm     Resting BP 126/64     Max Ex. HR 116 bpm     Max Ex. BP 146/60     2 Minute Post BP 144/56  additional reading 126/64       Interval HR   Baseline HR 83     1 Minute HR 106     2 Minute HR 122     3 Minute HR 113     4 Minute HR 113     5 Minute HR 115     6 Minute HR 116     2 Minute Post HR 94     Interval Heart Rate? Yes  Interval Oxygen   Interval Oxygen? Yes     Baseline Oxygen Saturation % 96 %     Baseline Liters of Oxygen 0 L  Room Air     1 Minute Oxygen Saturation % 94 %     1 Minute Liters of Oxygen 0 L     2 Minute Oxygen Saturation % 95 %     2 Minute  Liters of Oxygen 0 L     3 Minute Oxygen Saturation % 92 %     3 Minute Liters of Oxygen 0 L     4 Minute Oxygen Saturation % 93 %     4 Minute Liters of Oxygen 0 L     5 Minute Oxygen Saturation % 94 %     5 Minute Liters of Oxygen 0 L     6 Minute Oxygen Saturation % 94 %     6 Minute Liters of Oxygen 0 L     2 Minute Post Oxygen Saturation % 98 %     2 Minute Post Liters of Oxygen 0 L       Oxygen Initial Assessment:     Oxygen Initial Assessment - 12/28/16 1620      Home Oxygen   Home Oxygen Device None     Intervention   Short Term Goals To learn and understand importance of monitoring SPO2 with pulse oximeter and demonstrate accurate use of the pulse oximeter.;To Learn and understand importance of maintaining oxygen saturations>88%;To learn and demonstrate proper purse lipped breathing techniques or other breathing techniques.;To learn and demonstrate proper use of respiratory medications   Long  Term Goals Maintenance of O2 saturations>88%;Compliance with respiratory medication;Demonstrates proper use of MDI's;Verbalizes importance of monitoring SPO2 with pulse oximeter and return demonstration;Exhibits proper breathing techniques, such as purse lipped breathing or other method taught during program session      Oxygen Re-Evaluation:     Oxygen Re-Evaluation    Row Name 01/11/17 1434             Program Oxygen Prescription   Program Oxygen Prescription None         Home Oxygen   Home Oxygen Device None       Sleep Oxygen Prescription None       Home Exercise Oxygen Prescription None       Home at Rest Exercise Oxygen Prescription None         Goals/Expected Outcomes   Short Term Goals To learn and understand importance of monitoring SPO2 with pulse oximeter and demonstrate accurate use of the pulse oximeter.;To Learn and understand importance of maintaining oxygen saturations>88%;To learn and demonstrate proper purse lipped breathing techniques or other  breathing techniques.;To learn and demonstrate proper use of respiratory medications       Long  Term Goals Maintenance of O2 saturations>88%;Exhibits proper breathing techniques, such as purse lipped breathing or other method taught during program session;Verbalizes importance of monitoring SPO2 with pulse oximeter and return demonstration;Demonstrates proper use of MDI's;Compliance with respiratory medication       Comments Patient is doing well since starting the class. Her oxygen levels have been above 90 percent on all machines and walking. Patient is willing to work hard and show initiative when she feels like her exercise routine is to easy.       Goals/Expected Outcomes Short Term: Practice PLB to be more efficient. Long Term: Be Independent with PLB          Oxygen Discharge (Final Oxygen  Re-Evaluation):     Oxygen Re-Evaluation - 01/11/17 1434      Program Oxygen Prescription   Program Oxygen Prescription None     Home Oxygen   Home Oxygen Device None   Sleep Oxygen Prescription None   Home Exercise Oxygen Prescription None   Home at Rest Exercise Oxygen Prescription None     Goals/Expected Outcomes   Short Term Goals To learn and understand importance of monitoring SPO2 with pulse oximeter and demonstrate accurate use of the pulse oximeter.;To Learn and understand importance of maintaining oxygen saturations>88%;To learn and demonstrate proper purse lipped breathing techniques or other breathing techniques.;To learn and demonstrate proper use of respiratory medications   Long  Term Goals Maintenance of O2 saturations>88%;Exhibits proper breathing techniques, such as purse lipped breathing or other method taught during program session;Verbalizes importance of monitoring SPO2 with pulse oximeter and return demonstration;Demonstrates proper use of MDI's;Compliance with respiratory medication   Comments Patient is doing well since starting the class. Her oxygen levels have been  above 90 percent on all machines and walking. Patient is willing to work hard and show initiative when she feels like her exercise routine is to easy.   Goals/Expected Outcomes Short Term: Practice PLB to be more efficient. Long Term: Be Independent with PLB      Initial Exercise Prescription:     Initial Exercise Prescription - 12/28/16 1600      Date of Initial Exercise RX and Referring Provider   Date 12/28/16   Referring Provider Merton Border MD     Treadmill   MPH 2.2   Grade 0.5   Minutes 15   METs 2.84     NuStep   Level 2   SPM 80   Minutes 15   METs 2     REL-XR   Level 1   Speed 50   Minutes 15   METs 2     Prescription Details   Frequency (times per week) 3   Duration Progress to 45 minutes of aerobic exercise without signs/symptoms of physical distress     Intensity   THRR 40-80% of Max Heartrate 111-138   Ratings of Perceived Exertion 11-13   Perceived Dyspnea 0-4     Progression   Progression Continue to progress workloads to maintain intensity without signs/symptoms of physical distress.     Resistance Training   Training Prescription Yes   Weight 2 lbs   Reps 10-15      Perform Capillary Blood Glucose checks as needed.  Exercise Prescription Changes:     Exercise Prescription Changes    Row Name 01/04/17 1100 01/13/17 1500           Response to Exercise   Blood Pressure (Admit) 128/78 132/72      Blood Pressure (Exercise) 130/82 146/64      Blood Pressure (Exit) 118/98 126/64      Heart Rate (Admit) 118 bpm 104 bpm      Heart Rate (Exercise) 122 bpm 127 bpm      Heart Rate (Exit) 93 bpm 101 bpm      Oxygen Saturation (Admit) 97 % 94 %      Oxygen Saturation (Exercise) 94 % 93 %      Oxygen Saturation (Exit) 97 % 96 %      Rating of Perceived Exertion (Exercise) 13 12      Perceived Dyspnea (Exercise) 0 2      Symptoms  - none      Duration  -  Continue with 45 min of aerobic exercise without signs/symptoms of physical  distress.      Intensity  - THRR unchanged        Resistance Training   Training Prescription Yes Yes      Weight 2 lbs 5 lb      Reps 10-15 10-15        Interval Training   Interval Training  - No        Treadmill   MPH 2.2 2.6      Grade 0.5 0.5      Minutes 15 15      METs 2.84 3.17        NuStep   Level 2 2      SPM 80 80      Minutes 15 15      METs 2 2.9        REL-XR   Level 1  -      Speed 50  -      Minutes 15  -      METs 2  -        Home Exercise Plan   Plans to continue exercise at  - Home (comment)  walk to start      Frequency  - Add 1 additional day to program exercise sessions.      Initial Home Exercises Provided  - 01/11/17         Exercise Comments:     Exercise Comments    Row Name 01/04/17 1117           Exercise Comments First full day of exercise!  Patient was oriented to gym and equipment including functions, settings, policies, and procedures.  Patient's individual exercise prescription and treatment plan were reviewed.  All starting workloads were established based on the results of the 6 minute walk test done at initial orientation visit.  The plan for exercise progression was also introduced and progression will be customized based on patient's performance and goals          Exercise Goals and Review:     Exercise Goals    Row Name 12/28/16 1627             Exercise Goals   Increase Physical Activity Yes       Intervention Provide advice, education, support and counseling about physical activity/exercise needs.;Develop an individualized exercise prescription for aerobic and resistive training based on initial evaluation findings, risk stratification, comorbidities and participant's personal goals.       Expected Outcomes Achievement of increased cardiorespiratory fitness and enhanced flexibility, muscular endurance and strength shown through measurements of functional capacity and personal statement of participant.        Increase Strength and Stamina Yes       Intervention Provide advice, education, support and counseling about physical activity/exercise needs.;Develop an individualized exercise prescription for aerobic and resistive training based on initial evaluation findings, risk stratification, comorbidities and participant's personal goals.       Expected Outcomes Achievement of increased cardiorespiratory fitness and enhanced flexibility, muscular endurance and strength shown through measurements of functional capacity and personal statement of participant.          Exercise Goals Re-Evaluation :     Exercise Goals Re-Evaluation    Row Name 01/11/17 1055 01/13/17 1530           Exercise Goal Re-Evaluation   Exercise Goals Review Increase Physical Activity;Increase Strenth and Stamina Increase Physical Activity;Increase Strenth and Stamina  Comments Home exercise guidelines were reviewed with patient.  It was recommended for patinet to increase days of physical activity at home when she does not come to class.  Annagrace is adding walking at home to her current exercise.       Expected Outcomes Patient will continue to come to class regularly for pulmonary rehab and add 1-2 days of exercise at home outside of class.  Short - [place] will exercise regularly in LW and at home.  Long - [place] will finish the LW program.         Discharge Exercise Prescription (Final Exercise Prescription Changes):     Exercise Prescription Changes - 01/13/17 1500      Response to Exercise   Blood Pressure (Admit) 132/72   Blood Pressure (Exercise) 146/64   Blood Pressure (Exit) 126/64   Heart Rate (Admit) 104 bpm   Heart Rate (Exercise) 127 bpm   Heart Rate (Exit) 101 bpm   Oxygen Saturation (Admit) 94 %   Oxygen Saturation (Exercise) 93 %   Oxygen Saturation (Exit) 96 %   Rating of Perceived Exertion (Exercise) 12   Perceived Dyspnea (Exercise) 2   Symptoms none   Duration Continue with 45 min of  aerobic exercise without signs/symptoms of physical distress.   Intensity THRR unchanged     Resistance Training   Training Prescription Yes   Weight 5 lb   Reps 10-15     Interval Training   Interval Training No     Treadmill   MPH 2.6   Grade 0.5   Minutes 15   METs 3.17     NuStep   Level 2   SPM 80   Minutes 15   METs 2.9     Home Exercise Plan   Plans to continue exercise at Home (comment)  walk to start   Frequency Add 1 additional day to program exercise sessions.   Initial Home Exercises Provided 01/11/17      Nutrition:  Target Goals: Understanding of nutrition guidelines, daily intake of sodium <1569m, cholesterol <2027m calories 30% from fat and 7% or less from saturated fats, daily to have 5 or more servings of fruits and vegetables.  Biometrics:     Pre Biometrics - 12/28/16 1627      Pre Biometrics   Height 5' 0.7" (1.542 m)   Weight 148 lb 6.4 oz (67.3 kg)   Waist Circumference 37 inches   Hip Circumference 39 inches   Waist to Hip Ratio 0.95 %   BMI (Calculated) 28.4       Nutrition Therapy Plan and Nutrition Goals:   Nutrition Discharge: Rate Your Plate Scores:   Nutrition Goals Re-Evaluation:     Nutrition Goals Re-Evaluation    Row Name 01/11/17 1446             Goals   Nutrition Goal Make an appointment with the dietician       Expected Outcome Patient would like to lose 20lbs           Nutrition Goals Discharge (Final Nutrition Goals Re-Evaluation):     Nutrition Goals Re-Evaluation - 01/11/17 1446      Goals   Nutrition Goal Make an appointment with the dietician   Expected Outcome Patient would like to lose 20lbs       Psychosocial: Target Goals: Acknowledge presence or absence of significant depression and/or stress, maximize coping skills, provide positive support system. Participant is able to verbalize types and ability to use techniques  and skills needed for reducing stress and depression.   Initial  Review & Psychosocial Screening:     Initial Psych Review & Screening - 12/28/16 [place]? Yes   Comments Patient is frustrated that she cannot do activities she use to do because of her breathing. Her daughter treats her like she is her mother and she is frustrated when she acts like that to her. Diasha is looking forward to starting [employer].      Barriers   Psychosocial barriers to participate in program The patient should benefit from training in stress management and relaxation.     Screening Interventions   Interventions Encouraged to exercise;Program counselor consult      Quality of Life Scores:     Quality of Life - 12/28/16 1645      Quality of Life Scores   Health/Function Pre 21.97 %   Socioeconomic Pre 23.57 %   Psych/Spiritual Pre 14.5 %   Family Pre 20.5 %   GLOBAL Pre 20.95 %      PHQ-9: Recent Review Flowsheet Data    Depression screen [facility] 2/9 12/28/2016 10/27/2016 08/24/2016 04/22/2016 04/09/2016   Decreased Interest 0 0 0 0 0   Down, Depressed, Hopeless 0 0 0 0 0   PHQ - 2 Score 0 0 0 0 0   Altered sleeping 0 - - - -   Tired, decreased energy 1 - - - -   Change in appetite 2 - - - -   Feeling bad or failure about yourself  0 - - - -   Trouble concentrating 2 - - - -   Moving slowly or fidgety/restless 2 - - - -   Suicidal thoughts 0 - - - -   PHQ-9 Score 7 - - - -   Difficult doing work/chores Very difficult - - - -     Interpretation of Total Score  Total Score Depression Severity:  1-4 = Minimal depression, 5-9 = Mild depression, 10-14 = Moderate depression, 15-19 = Moderately severe depression, 20-27 = Severe depression   Psychosocial Evaluation and Intervention:     Psychosocial Evaluation - 01/04/17 1029      Psychosocial Evaluation & Interventions   Interventions Encouraged to exercise with the program and follow exercise prescription;Stress management education;Relaxation education   Comments  Counselor met with Arnetta today for initial psychosocial evaluation.  She is a 66 year old who has COPD.  Shalonda has a daughter who lives close by and several other family members locally for support for her.  She reports not sleeping well and has tried Melatonin OTC to help with some success.  She admits to a history of depression and anxiety and is on medication currently for several months that she reports as helpful.  Her mood is generally positive most of the time.  Her stressors are her health and conflict with one of her sisters recently.  Irva has goals to lose weight and increase her stamina and strength.  She will be followed by staff throughout the course of this program.     Expected Outcomes Shilynn will benefit from consistent exercise to achieve her stated goals.  It will also possibly help with her current sleep issues.  Athaliah should meet with the dietician to address her weight loss goals. She will also benefit from the psychoeducational components of this program to develop positive coping strategies with her current stressors.  Staff will be following.  Continue Psychosocial Services  Follow up required by staff      Psychosocial Re-Evaluation:   Psychosocial Discharge (Final Psychosocial Re-Evaluation):   Education: Education Goals: Education classes will be provided on a weekly basis, covering required topics. Participant will state understanding/return demonstration of topics presented.  Learning Barriers/Preferences:     Learning Barriers/Preferences - 12/28/16 1622      Learning Barriers/Preferences   Learning Barriers None      Education Topics: Initial Evaluation Education: - Verbal, written and demonstration of respiratory meds, RPE/PD scales, oximetry and breathing techniques. Instruction on use of nebulizers and MDIs: cleaning and proper use, rinsing mouth with steroid doses and importance of monitoring MDI activations.   Pulmonary Rehab from  01/22/2017 in [facility] Cardiac and Pulmonary Rehab  Date  12/28/16  Educator  [facility]  Instruction Review Code  2- meets goals/outcomes      General Nutrition Guidelines/Fats and Fiber: -Group instruction provided by verbal, written material, models and posters to present the general guidelines for heart healthy nutrition. Gives an explanation and review of dietary fats and fiber.   Controlling Sodium/Reading Food Labels: -Group verbal and written material supporting the discussion of sodium use in heart healthy nutrition. Review and explanation with models, verbal and written materials for utilization of the food label.   Exercise Physiology & Risk Factors: - Group verbal and written instruction with models to review the exercise physiology of the cardiovascular system and associated critical values. Details cardiovascular disease risk factors and the goals associated with each risk factor.   Pulmonary Rehab from 01/22/2017 in [facility] Cardiac and Pulmonary Rehab  Date  01/06/17  Educator  [facility]  Instruction Review Code  2- meets goals/outcomes      Aerobic Exercise & Resistance Training: - Gives group verbal and written discussion on the health impact of inactivity. On the components of aerobic and resistive training programs and the benefits of this training and how to safely progress through these programs.   Pulmonary Rehab from 01/22/2017 in [facility] Cardiac and Pulmonary Rehab  Date  01/22/17  Educator  [facility]  Instruction Review Code  2- meets goals/outcomes      Flexibility, Balance, General Exercise Guidelines: - Provides group verbal and written instruction on the benefits of flexibility and balance training programs. Provides general exercise guidelines with specific guidelines to those with heart or lung disease. Demonstration and skill practice provided.   Stress Management: - Provides group verbal and written instruction about the health risks of elevated stress, cause of high stress, and  healthy ways to reduce stress.   Depression: - Provides group verbal and written instruction on the correlation between heart/lung disease and depressed mood, treatment options, and the stigmas associated with seeking treatment.   Exercise & Equipment Safety: - Individual verbal instruction and demonstration of equipment use and safety with use of the equipment.   Pulmonary Rehab from 01/22/2017 in [facility] Cardiac and Pulmonary Rehab  Date  01/04/17  Educator  [facility]  Instruction Review Code  2- meets goals/outcomes      Infection Prevention: - Provides verbal and written material to individual with discussion of infection control including proper hand washing and proper equipment cleaning during exercise session.   Pulmonary Rehab from 01/22/2017 in [facility] Cardiac and Pulmonary Rehab  Date  01/04/17  Educator  [facility]  Instruction Review Code  2- meets goals/outcomes      Falls Prevention: - Provides verbal and written material to individual with discussion of falls prevention and safety.  Pulmonary Rehab from 01/22/2017 in [facility] Cardiac and Pulmonary Rehab  Date  12/28/16  Educator  [facility]  Instruction Review Code  2- meets goals/outcomes      Diabetes: - Individual verbal and written instruction to review signs/symptoms of diabetes, desired ranges of glucose level fasting, after meals and with exercise. Advice that pre and post exercise glucose checks will be done for 3 sessions at entry of program.   Chronic Lung Diseases: - Group verbal and written instruction to review new updates, new respiratory medications, new advancements in procedures and treatments. Provide informative websites and "800" numbers of self-education.   Pulmonary Rehab from 01/22/2017 in [facility] Cardiac and Pulmonary Rehab  Date  01/13/17  Educator  [facility]  Instruction Review Code  2- meets goals/outcomes      Lung Procedures: - Group verbal and written instruction to describe testing methods done to diagnose lung  disease. Review the outcome of test results. Describe the treatment choices: Pulmonary Function Tests, ABGs and oximetry.   Energy Conservation: - Provide group verbal and written instruction for methods to conserve energy, plan and organize activities. Instruct on pacing techniques, use of adaptive equipment and posture/positioning to relieve shortness of breath.   Triggers: - Group verbal and written instruction to review types of environmental controls: home humidity, furnaces, filters, dust mite/pet prevention, HEPA vacuums. To discuss weather changes, air quality and the benefits of nasal washing.   Exacerbations: - Group verbal and written instruction to provide: warning signs, infection symptoms, calling MD promptly, preventive modes, and value of vaccinations. Review: effective airway clearance, coughing and/or vibration techniques. Create an Sports administrator.   Oxygen: - Individual and group verbal and written instruction on oxygen therapy. Includes supplement oxygen, available portable oxygen systems, continuous and intermittent flow rates, oxygen safety, concentrators, and Medicare reimbursement for oxygen.   Respiratory Medications: - Group verbal and written instruction to review medications for lung disease. Drug class, frequency, complications, importance of spacers, rinsing mouth after steroid MDI's, and proper cleaning methods for nebulizers.   Pulmonary Rehab from 01/22/2017 in [facility] Cardiac and Pulmonary Rehab  Date  12/28/16  Educator  [facility]  Instruction Review Code  2- meets goals/outcomes      AED/CPR: - Group verbal and written instruction with the use of models to demonstrate the basic use of the AED with the basic ABC's of resuscitation.   Breathing Retraining: - Provides individuals verbal and written instruction on purpose, frequency, and proper technique of diaphragmatic breathing and pursed-lipped breathing. Applies individual practice skills.   Pulmonary Rehab  from 01/22/2017 in [facility] Cardiac and Pulmonary Rehab  Date  01/04/17  Educator  [facility]  Instruction Review Code  2- meets goals/outcomes      Anatomy and Physiology of the Lungs: - Group verbal and written instruction with the use of models to provide basic lung anatomy and physiology related to function, structure and complications of lung disease.   Heart Failure: - Group verbal and written instruction on the basics of heart failure: signs/symptoms, treatments, explanation of ejection fraction, enlarged heart and cardiomyopathy.   Sleep Apnea: - Individual verbal and written instruction to review Obstructive Sleep Apnea. Review of risk factors, methods for diagnosing and types of masks and machines for OSA.   Anxiety: - Provides group, verbal and written instruction on the correlation between heart/lung disease and anxiety, treatment options, and management of anxiety.   Relaxation: - Provides group, verbal and written instruction about the benefits of relaxation for patients with heart/lung disease. Also  provides patients with examples of relaxation techniques.   Pulmonary Rehab from 01/22/2017 in [facility] Cardiac and Pulmonary Rehab  Date  01/20/17  Educator  [facility]  Instruction Review Code  2- Meets goals/outcomes      Knowledge Questionnaire Score:     Knowledge Questionnaire Score - 12/28/16 1622      Knowledge Questionnaire Score   Pre Score 9/10       Core Components/Risk Factors/Patient Goals at Admission:     Personal Goals and Risk Factors at Admission - 12/28/16 1617      Core Components/Risk Factors/Patient Goals on Admission    Weight Management Yes;Weight Loss   Intervention Weight Management/Obesity: Establish reasonable short term and long term weight goals.;Weight Management: Develop a combined nutrition and exercise program designed to reach desired caloric intake, while maintaining appropriate intake of nutrient and fiber, sodium and fats, and appropriate energy  expenditure required for the weight goal.;Weight Management: Provide education and appropriate resources to help participant work on and attain dietary goals.   Admit Weight 148 lb 6.4 oz (67.3 kg)   Goal Weight: Short Term 143 lb (64.9 kg)   Goal Weight: Long Term 125 lb (56.7 kg)   Expected Outcomes Short Term: Continue to assess and modify interventions until short term weight is achieved;Long Term: Adherence to nutrition and physical activity/exercise program aimed toward attainment of established weight goal;Weight Loss: Understanding of general recommendations for a balanced deficit meal plan, which promotes 1-2 lb weight loss per week and includes a negative energy balance of [phone] kcal/d;Understanding recommendations for meals to include 15-35% energy as protein, 25-35% energy from fat, 35-60% energy from carbohydrates, less than 271m of dietary cholesterol, 20-35 gm of total fiber daily;Understanding of distribution of calorie intake throughout the day with the consumption of 4-5 meals/snacks   Improve shortness of breath with ADL's Yes   Intervention Provide education, individualized exercise plan and daily activity instruction to help decrease symptoms of SOB with activities of daily living.   Expected Outcomes Short Term: Achieves a reduction of symptoms when performing activities of daily living.   Develop more efficient breathing techniques such as purse lipped breathing and diaphragmatic breathing; and practicing self-pacing with activity Yes   Intervention Provide education, demonstration and support about specific breathing techniuqes utilized for more efficient breathing. Include techniques such as pursed lipped breathing, diaphragmatic breathing and self-pacing activity.   Expected Outcomes Short Term: Participant will be able to demonstrate and use breathing techniques as needed throughout daily activities.   Increase knowledge of respiratory medications and ability to use  respiratory devices properly  Yes  Spiriva   Intervention Provide education and demonstration as needed of appropriate use of medications, inhalers, and oxygen therapy.   Expected Outcomes Short Term: Achieves understanding of medications use. Understands that oxygen is a medication prescribed by physician. Demonstrates appropriate use of inhaler and oxygen therapy.   Hypertension Yes   Intervention Provide education on lifestyle modifcations including regular physical activity/exercise, weight management, moderate sodium restriction and increased consumption of fresh fruit, vegetables, and low fat dairy, alcohol moderation, and smoking cessation.;Monitor prescription use compliance.   Expected Outcomes Short Term: Continued assessment and intervention until BP is < 140/912mHG in hypertensive participants. < 130/8021mG in hypertensive participants with diabetes, heart failure or chronic kidney disease.;Long Term: Maintenance of blood pressure at goal levels.      Core Components/Risk Factors/Patient Goals Review:      Goals and Risk Factor Review    Row Name 01/04/17  1108 01/11/17 1427           Core Components/Risk Factors/Patient Goals Review   Personal Goals Review Develop more efficient breathing techniques such as purse lipped breathing and diaphragmatic breathing and practicing self-pacing with activity.;Weight Management/Obesity Weight Management/Obesity      Review Discussed and demonstrated pursed lip breathing with patient. She demonstrated understanding of how and when to use this breathing technique. Patient does want to meet with the dietician to discuss weight loss goals.  Discussed patients plan for weight loss. Explained to patient that she can make an appointment to see the Nutritionist for guidance on diet.      Expected Outcomes Short: Pat[place] will use PLB to control SOB during exercise and ADL's. Patient wants to lose 5 lbs.      Long: patient's goal weight is 125 lbs.   Short: Patient wants to lose weight. Patient will set up an appointment to see the Nutritionist. She feels that if she loses weight she can feel better and have more energy. Long: Patient wants to be 120-125lbs. Patient will work on eating better for [employer].         Core Components/Risk Factors/Patient Goals at Discharge (Final Review):      Goals and Risk Factor Review - 01/11/17 1427      Core Components/Risk Factors/Patient Goals Review   Personal Goals Review Weight Management/Obesity   Review Discussed patients plan for weight loss. Explained to patient that she can make an appointment to see the Nutritionist for guidance on diet.   Expected Outcomes Short: Patient wants to lose weight. Patient will set up an appointment to see the Nutritionist. She feels that if she loses weight she can feel better and have more energy. Long: Patient wants to be 120-125lbs. Patient will work on eating better for [employer].      ITP Comments:     ITP Comments    Row Name 01/15/17 1024 01/25/17 0858         ITP Comments Patient attened Education "Know Your Numbers" 30 day review completed ITP sent to Dr. Ramonita Lab for Dr. Emily Filbert Director of [place]. Continue with ITP unless changes are made by physician.          Comments:

## 2017-01-25 NOTE — Progress Notes (Signed)
Daily Session Note  Patient Details  Name: Joanna Rodriguez MRN: [id number] Date of Birth: July 01, 1950 Referring Provider:     Pulmonary Rehab from 12/28/2016 in [facility] Cardiac and Pulmonary Rehab  Referring Provider  Merton Border MD      Encounter Date: 01/25/2017  Check In:     Session Check In - 01/25/17 1015      Check-In   Location [facility]-Cardiac & Pulmonary Rehab   Staff Present Justin Mend Jaci Carrel, BS, ACSM CEP, Exercise Physiologist;Amanda Oletta Darter, [place], ACSM CEP, Exercise Physiologist   Supervising physician immediately available to respond to emergencies [name] immediately available ER MD   Medication changes reported     No   Fall or balance concerns reported    No   Warm-up and Cool-down Performed as group-led instruction   Resistance Training Performed Yes   VAD Patient? No     Pain Assessment   Currently in Pain? No/denies   Multiple Pain Sites No         History  Smoking Status  . Former Smoker  . Packs/day: 1.50  . Years: 53.00  . Types: Cigarettes  . Quit date: 08/22/2014  Smokeless Tobacco  . Never Used    Comment: NOV 16,2015    Goals Met:  Proper associated with RPD/PD & O2 Sat Independence with exercise equipment Exercise tolerated well Strength training completed today  Goals Unmet:  Not Applicable  Comments: Pt able to follow exercise prescription today without complaint.  Will continue to monitor for progression.    Dr. Emily Filbert is Medical Director for [place] and [name].

## 2017-01-27 ENCOUNTER — Encounter: Payer: Medicare HMO | Attending: Pulmonary Disease

## 2017-01-27 DIAGNOSIS — M419 Scoliosis, unspecified: Secondary | ICD-10-CM | POA: Insufficient documentation

## 2017-01-27 DIAGNOSIS — M8580 Other specified disorders of bone density and structure, unspecified site: Secondary | ICD-10-CM | POA: Diagnosis not present

## 2017-01-27 DIAGNOSIS — Z87891 Personal history of nicotine dependence: Secondary | ICD-10-CM | POA: Insufficient documentation

## 2017-01-27 DIAGNOSIS — Z79899 Other long term (current) drug therapy: Secondary | ICD-10-CM | POA: Diagnosis not present

## 2017-01-27 DIAGNOSIS — E039 Hypothyroidism, unspecified: Secondary | ICD-10-CM | POA: Diagnosis not present

## 2017-01-27 DIAGNOSIS — Z87442 Personal history of urinary calculi: Secondary | ICD-10-CM | POA: Diagnosis not present

## 2017-01-27 DIAGNOSIS — I10 Essential (primary) hypertension: Secondary | ICD-10-CM | POA: Insufficient documentation

## 2017-01-27 DIAGNOSIS — M1990 Unspecified osteoarthritis, unspecified site: Secondary | ICD-10-CM | POA: Diagnosis not present

## 2017-01-27 DIAGNOSIS — K5090 Crohn's disease, unspecified, without complications: Secondary | ICD-10-CM | POA: Diagnosis not present

## 2017-01-27 DIAGNOSIS — J449 Chronic obstructive pulmonary disease, unspecified: Secondary | ICD-10-CM | POA: Diagnosis not present

## 2017-01-27 NOTE — Progress Notes (Signed)
Daily Session Note  Patient Details  Name: Joanna Rodriguez MRN: [id number] Date of Birth: 1950-07-28 Referring Provider:     Pulmonary Rehab from 12/28/2016 in [facility] Cardiac and Pulmonary Rehab  Referring Provider  Merton Border MD      Encounter Date: 01/27/2017  Check In:     Session Check In - 01/27/17 1023      Check-In   Location [facility]-Cardiac & Pulmonary Rehab   Staff Present Alberteen Sam, MA, ACSM RCEP, Exercise Physiologist;Amanda Oletta Darter, BA, ACSM CEP, Exercise Physiologist;Alano Blasco Flavia Shipper   Supervising physician immediately available to respond to emergencies [name] immediately available ER MD   Physician(s) Dr. Mable Paris and Jacqualine Code   Medication changes reported     No   Fall or balance concerns reported    No   Warm-up and Cool-down Performed as group-led instruction   Resistance Training Performed Yes   VAD Patient? No     Pain Assessment   Currently in Pain? No/denies   Multiple Pain Sites No         History  Smoking Status  . Former Smoker  . Packs/day: 1.50  . Years: 53.00  . Types: Cigarettes  . Quit date: 08/22/2014  Smokeless Tobacco  . Never Used    Comment: NOV 16,2015    Goals Met:  Proper associated with RPD/PD & O2 Sat Independence with exercise equipment Exercise tolerated well Strength training completed today  Goals Unmet:  Not Applicable  Comments: Pt able to follow exercise prescription today without complaint.  Will continue to monitor for progression.   Dr. Emily Filbert is Medical Director for [place] and [name].

## 2017-01-29 ENCOUNTER — Encounter: Payer: Medicare HMO | Admitting: *Deleted

## 2017-01-29 ENCOUNTER — Encounter: Payer: Self-pay | Admitting: Dietician

## 2017-01-29 DIAGNOSIS — I10 Essential (primary) hypertension: Secondary | ICD-10-CM | POA: Diagnosis not present

## 2017-01-29 DIAGNOSIS — J449 Chronic obstructive pulmonary disease, unspecified: Secondary | ICD-10-CM

## 2017-01-29 DIAGNOSIS — K5090 Crohn's disease, unspecified, without complications: Secondary | ICD-10-CM | POA: Diagnosis not present

## 2017-01-29 DIAGNOSIS — E039 Hypothyroidism, unspecified: Secondary | ICD-10-CM | POA: Diagnosis not present

## 2017-01-29 DIAGNOSIS — M8580 Other specified disorders of bone density and structure, unspecified site: Secondary | ICD-10-CM | POA: Diagnosis not present

## 2017-01-29 DIAGNOSIS — M1990 Unspecified osteoarthritis, unspecified site: Secondary | ICD-10-CM | POA: Diagnosis not present

## 2017-01-29 DIAGNOSIS — Z87442 Personal history of urinary calculi: Secondary | ICD-10-CM | POA: Diagnosis not present

## 2017-01-29 DIAGNOSIS — Z79899 Other long term (current) drug therapy: Secondary | ICD-10-CM | POA: Diagnosis not present

## 2017-01-29 DIAGNOSIS — Z87891 Personal history of nicotine dependence: Secondary | ICD-10-CM | POA: Diagnosis not present

## 2017-01-29 NOTE — Progress Notes (Signed)
Daily Session Note  Patient Details  Name: Joanna Rodriguez MRN: [id number] Date of Birth: Mar 13, 1951 Referring Provider:     Pulmonary Rehab from 12/28/2016 in [facility] Cardiac and Pulmonary Rehab  Referring Provider  Merton Border MD      Encounter Date: 01/29/2017  Check In:     Session Check In - 01/29/17 1008      Check-In   Location [facility]-Cardiac & Pulmonary Rehab   Staff Present Justin Mend RCP,RRT,BSRT;Vernice Mannina, RN, Vickki Hearing, BA, ACSM CEP, Exercise Physiologist   Supervising physician immediately available to respond to emergencies [name] immediately available ER MD   Physician(s) Dr. Alfred Levins and Dr. Jimmye Norman   Medication changes reported     No   Fall or balance concerns reported    No   Tobacco Cessation No Change   Warm-up and Cool-down Performed as group-led instruction   Resistance Training Performed Yes   VAD Patient? No     Pain Assessment   Currently in Pain? No/denies           Exercise Prescription Changes - 01/28/17 1500      Response to Exercise   Blood Pressure (Admit) 118/60   Blood Pressure (Exercise) 132/74   Blood Pressure (Exit) 112/66   Heart Rate (Admit) 98 bpm   Heart Rate (Exercise) 131 bpm   Heart Rate (Exit) 104 bpm   Oxygen Saturation (Admit) 95 %   Oxygen Saturation (Exercise) 93 %   Oxygen Saturation (Exit) 95 %   Rating of Perceived Exertion (Exercise) 13   Perceived Dyspnea (Exercise) 3   Symptoms none   Duration Continue with 45 min of aerobic exercise without signs/symptoms of physical distress.   Intensity THRR unchanged     Progression   Progression Continue to progress workloads to maintain intensity without signs/symptoms of physical distress.   Average METs 3.6     Treadmill   MPH 2.6   Grade 3   Minutes 15   METs 4.07     NuStep   Level 6   SPM 73   Minutes 15   METs 3.1      History  Smoking Status  . Former Smoker  . Packs/day: 1.50  . Years: 53.00  . Types: Cigarettes  .  Quit date: 08/22/2014  Smokeless Tobacco  . Never Used    Comment: NOV 16,2015    Goals Met:  Proper associated with RPD/PD & O2 Sat Exercise tolerated well Strength training completed today  Goals Unmet:  Not Applicable  Comments:     Dr. Emily Filbert is Medical Director for [place] and [name].

## 2017-02-01 ENCOUNTER — Encounter: Payer: Medicare HMO | Admitting: *Deleted

## 2017-02-01 DIAGNOSIS — I10 Essential (primary) hypertension: Secondary | ICD-10-CM | POA: Diagnosis not present

## 2017-02-01 DIAGNOSIS — K5090 Crohn's disease, unspecified, without complications: Secondary | ICD-10-CM | POA: Diagnosis not present

## 2017-02-01 DIAGNOSIS — J449 Chronic obstructive pulmonary disease, unspecified: Secondary | ICD-10-CM

## 2017-02-01 DIAGNOSIS — M8580 Other specified disorders of bone density and structure, unspecified site: Secondary | ICD-10-CM | POA: Diagnosis not present

## 2017-02-01 DIAGNOSIS — Z87442 Personal history of urinary calculi: Secondary | ICD-10-CM | POA: Diagnosis not present

## 2017-02-01 DIAGNOSIS — Z87891 Personal history of nicotine dependence: Secondary | ICD-10-CM | POA: Diagnosis not present

## 2017-02-01 DIAGNOSIS — E039 Hypothyroidism, unspecified: Secondary | ICD-10-CM | POA: Diagnosis not present

## 2017-02-01 DIAGNOSIS — M1990 Unspecified osteoarthritis, unspecified site: Secondary | ICD-10-CM | POA: Diagnosis not present

## 2017-02-01 DIAGNOSIS — Z79899 Other long term (current) drug therapy: Secondary | ICD-10-CM | POA: Diagnosis not present

## 2017-02-01 NOTE — Progress Notes (Signed)
Daily Session Note  Patient Details  Name: Joanna Rodriguez MRN: [id number] Date of Birth: [birth year] Referring Provider:     Pulmonary Rehab from 12/28/2016 in [facility] Cardiac and Pulmonary Rehab  Referring Provider  Merton Border MD      Encounter Date: 02/01/2017  Check In:     Session Check In - 02/01/17 1016      Check-In   Location [facility]-Cardiac & Pulmonary Rehab   Staff Present Carson Myrtle, BS, RRT, Respiratory Therapist;Joseph Tedd Sias, BS, ACSM CEP, Exercise Physiologist   Supervising physician immediately available to respond to emergencies [name] immediately available ER MD   Physician(s) Dr. Kerman Passey and Dr. Jimmye Norman    Medication changes reported     No   Fall or balance concerns reported    No   Warm-up and Cool-down Performed as group-led instruction   Resistance Training Performed Yes   VAD Patient? No     Pain Assessment   Currently in Pain? No/denies   Multiple Pain Sites No         History  Smoking Status  . Former Smoker  . Packs/day: 1.50  . Years: 53.00  . Types: Cigarettes  . Quit date: 08/22/2014  Smokeless Tobacco  . Never Used    Comment: NOV 16,2015    Goals Met:  Proper associated with RPD/PD & O2 Sat Independence with exercise equipment Exercise tolerated well Strength training completed today  Goals Unmet:  Not Applicable  Comments: Pt able to follow exercise prescription today without complaint.  Will continue to monitor for progression.    Dr. Emily Filbert is Medical Director for [place] and [name].

## 2017-02-03 ENCOUNTER — Encounter: Payer: Medicare HMO | Admitting: *Deleted

## 2017-02-03 DIAGNOSIS — Z87891 Personal history of nicotine dependence: Secondary | ICD-10-CM | POA: Diagnosis not present

## 2017-02-03 DIAGNOSIS — M1990 Unspecified osteoarthritis, unspecified site: Secondary | ICD-10-CM | POA: Diagnosis not present

## 2017-02-03 DIAGNOSIS — J449 Chronic obstructive pulmonary disease, unspecified: Secondary | ICD-10-CM | POA: Diagnosis not present

## 2017-02-03 DIAGNOSIS — K5090 Crohn's disease, unspecified, without complications: Secondary | ICD-10-CM | POA: Diagnosis not present

## 2017-02-03 DIAGNOSIS — E039 Hypothyroidism, unspecified: Secondary | ICD-10-CM | POA: Diagnosis not present

## 2017-02-03 DIAGNOSIS — I10 Essential (primary) hypertension: Secondary | ICD-10-CM | POA: Diagnosis not present

## 2017-02-03 DIAGNOSIS — Z79899 Other long term (current) drug therapy: Secondary | ICD-10-CM | POA: Diagnosis not present

## 2017-02-03 DIAGNOSIS — Z87442 Personal history of urinary calculi: Secondary | ICD-10-CM | POA: Diagnosis not present

## 2017-02-03 DIAGNOSIS — M8580 Other specified disorders of bone density and structure, unspecified site: Secondary | ICD-10-CM | POA: Diagnosis not present

## 2017-02-03 NOTE — Progress Notes (Signed)
Daily Session Note  Patient Details  Name: Joanna Rodriguez MRN: [id number] Date of Birth: [birth year] Referring Provider:     Pulmonary Rehab from 12/28/2016 in [facility] Cardiac and Pulmonary Rehab  Referring Provider  Merton Border MD      Encounter Date: 02/03/2017  Check In:     Session Check In - 02/03/17 1113      Check-In   Staff Present Heath Lark, RN, BSN, CCRP;Jessica Luan Pulling, MA, ACSM RCEP, Exercise Physiologist;Joseph Flavia Shipper   Supervising physician immediately available to respond to emergencies [name] immediately available ER MD   Physician(s) Drs: Jimmye Norman and Archie Balboa   Medication changes reported     No   Fall or balance concerns reported    No   Warm-up and Cool-down Performed as group-led instruction   Resistance Training Performed Yes   VAD Patient? No     Pain Assessment   Currently in Pain? No/denies         History  Smoking Status  . Former Smoker  . Packs/day: 1.50  . Years: 53.00  . Types: Cigarettes  . Quit date: 08/22/2014  Smokeless Tobacco  . Never Used    Comment: NOV 16,2015    Goals Met:  Proper associated with RPD/PD & O2 Sat Exercise tolerated well Personal goals reviewed Strength training completed today  Goals Unmet:  Not Applicable  Comments: Doing well with exercise prescription progression.    Dr. Emily Filbert is Medical Director for [name] and [name].

## 2017-02-05 DIAGNOSIS — K5090 Crohn's disease, unspecified, without complications: Secondary | ICD-10-CM | POA: Diagnosis not present

## 2017-02-05 DIAGNOSIS — E039 Hypothyroidism, unspecified: Secondary | ICD-10-CM | POA: Diagnosis not present

## 2017-02-05 DIAGNOSIS — J449 Chronic obstructive pulmonary disease, unspecified: Secondary | ICD-10-CM | POA: Diagnosis not present

## 2017-02-05 DIAGNOSIS — M8580 Other specified disorders of bone density and structure, unspecified site: Secondary | ICD-10-CM | POA: Diagnosis not present

## 2017-02-05 DIAGNOSIS — I10 Essential (primary) hypertension: Secondary | ICD-10-CM | POA: Diagnosis not present

## 2017-02-05 DIAGNOSIS — M1990 Unspecified osteoarthritis, unspecified site: Secondary | ICD-10-CM | POA: Diagnosis not present

## 2017-02-05 DIAGNOSIS — Z87891 Personal history of nicotine dependence: Secondary | ICD-10-CM | POA: Diagnosis not present

## 2017-02-05 DIAGNOSIS — Z79899 Other long term (current) drug therapy: Secondary | ICD-10-CM | POA: Diagnosis not present

## 2017-02-05 DIAGNOSIS — Z87442 Personal history of urinary calculi: Secondary | ICD-10-CM | POA: Diagnosis not present

## 2017-02-05 NOTE — Progress Notes (Signed)
Daily Session Note  Patient Details  Name: Joanna Rodriguez MRN: [id number] Date of Birth: 09-09-1950 Referring Provider:     Pulmonary Rehab from 12/28/2016 in [facility] Cardiac and Pulmonary Rehab  Referring Provider  Merton Border MD      Encounter Date: 02/05/2017  Check In:     Session Check In - 02/05/17 1026      Check-In   Location [facility]-Cardiac & Pulmonary Rehab   Staff Present Alberteen Sam, MA, ACSM RCEP, Exercise Physiologist;Trinidy Masterson Flavia Shipper   Supervising physician immediately available to respond to emergencies [name] immediately available ER MD   Physician(s) Dr. Jacqualine Code and Corky Downs   Medication changes reported     No   Fall or balance concerns reported    No   Warm-up and Cool-down Performed as group-led instruction   Resistance Training Performed Yes   VAD Patient? No     Pain Assessment   Currently in Pain? No/denies   Multiple Pain Sites No         History  Smoking Status  . Former Smoker  . Packs/day: 1.50  . Years: 53.00  . Types: Cigarettes  . Quit date: 08/22/2014  Smokeless Tobacco  . Never Used    Comment: NOV 16,2015    Goals Met:  Proper associated with RPD/PD & O2 Sat Independence with exercise equipment Exercise tolerated well No report of cardiac concerns or symptoms Strength training completed today  Goals Unmet:  Not Applicable  Comments: Pt able to follow exercise prescription today without complaint.  Will continue to monitor for progression.   Dr. Emily Filbert is Medical Director for [place] and [name].

## 2017-02-10 ENCOUNTER — Encounter: Payer: Medicare HMO | Admitting: *Deleted

## 2017-02-10 DIAGNOSIS — M8580 Other specified disorders of bone density and structure, unspecified site: Secondary | ICD-10-CM | POA: Diagnosis not present

## 2017-02-10 DIAGNOSIS — J449 Chronic obstructive pulmonary disease, unspecified: Secondary | ICD-10-CM | POA: Diagnosis not present

## 2017-02-10 DIAGNOSIS — Z87442 Personal history of urinary calculi: Secondary | ICD-10-CM | POA: Diagnosis not present

## 2017-02-10 DIAGNOSIS — K5090 Crohn's disease, unspecified, without complications: Secondary | ICD-10-CM | POA: Diagnosis not present

## 2017-02-10 DIAGNOSIS — I10 Essential (primary) hypertension: Secondary | ICD-10-CM | POA: Diagnosis not present

## 2017-02-10 DIAGNOSIS — M1990 Unspecified osteoarthritis, unspecified site: Secondary | ICD-10-CM | POA: Diagnosis not present

## 2017-02-10 DIAGNOSIS — Z87891 Personal history of nicotine dependence: Secondary | ICD-10-CM | POA: Diagnosis not present

## 2017-02-10 DIAGNOSIS — E039 Hypothyroidism, unspecified: Secondary | ICD-10-CM | POA: Diagnosis not present

## 2017-02-10 DIAGNOSIS — Z79899 Other long term (current) drug therapy: Secondary | ICD-10-CM | POA: Diagnosis not present

## 2017-02-10 NOTE — Progress Notes (Signed)
Daily Session Note  Patient Details  Name: Joanna Rodriguez MRN: [id number] Date of Birth: [birth year] Referring Provider:     Pulmonary Rehab from 12/28/2016 in [facility] Cardiac and Pulmonary Rehab  Referring Provider  Merton Border MD      Encounter Date: 02/10/2017  Check In:     Session Check In - 02/10/17 1010      Check-In   Location [facility]-Cardiac & Pulmonary Rehab   Staff Present Alberteen Sam, MA, ACSM RCEP, Exercise Physiologist;Amanda Oletta Darter, BA, ACSM CEP, Exercise Physiologist;Leyna Vanderkolk Flavia Shipper   Supervising physician immediately available to respond to emergencies [name] immediately available ER MD   Physician(s) Drs. Quentin Cornwall and [place]   Medication changes reported     No   Fall or balance concerns reported    No   Warm-up and Cool-down Performed as group-led Location manager Performed Yes   VAD Patient? No     Pain Assessment   Currently in Pain? No/denies   Multiple Pain Sites No         History  Smoking Status  . Former Smoker  . Packs/day: 1.50  . Years: 53.00  . Types: Cigarettes  . Quit date: 08/22/2014  Smokeless Tobacco  . Never Used    Comment: NOV 16,2015    Goals Met:  Proper associated with RPD/PD & O2 Sat Independence with exercise equipment Exercise tolerated well No report of cardiac concerns or symptoms Strength training completed today  Goals Unmet:  Not Applicable  Comments: Pt able to follow exercise prescription today without complaint.  Will continue to monitor for progression.   Dr. Emily Filbert is Medical Director for [place] and [name].

## 2017-02-12 ENCOUNTER — Encounter: Payer: Medicare HMO | Admitting: *Deleted

## 2017-02-12 DIAGNOSIS — M1990 Unspecified osteoarthritis, unspecified site: Secondary | ICD-10-CM | POA: Diagnosis not present

## 2017-02-12 DIAGNOSIS — M8580 Other specified disorders of bone density and structure, unspecified site: Secondary | ICD-10-CM | POA: Diagnosis not present

## 2017-02-12 DIAGNOSIS — E039 Hypothyroidism, unspecified: Secondary | ICD-10-CM | POA: Diagnosis not present

## 2017-02-12 DIAGNOSIS — Z87442 Personal history of urinary calculi: Secondary | ICD-10-CM | POA: Diagnosis not present

## 2017-02-12 DIAGNOSIS — Z79899 Other long term (current) drug therapy: Secondary | ICD-10-CM | POA: Diagnosis not present

## 2017-02-12 DIAGNOSIS — Z87891 Personal history of nicotine dependence: Secondary | ICD-10-CM | POA: Diagnosis not present

## 2017-02-12 DIAGNOSIS — I10 Essential (primary) hypertension: Secondary | ICD-10-CM | POA: Diagnosis not present

## 2017-02-12 DIAGNOSIS — J449 Chronic obstructive pulmonary disease, unspecified: Secondary | ICD-10-CM | POA: Diagnosis not present

## 2017-02-12 DIAGNOSIS — K5090 Crohn's disease, unspecified, without complications: Secondary | ICD-10-CM | POA: Diagnosis not present

## 2017-02-12 NOTE — Progress Notes (Signed)
Daily Session Note  Patient Details  Name: Joanna Rodriguez MRN: [id number] Date of Birth: 10-15-1950 Referring Provider:     Pulmonary Rehab from 12/28/2016 in [facility] Cardiac and Pulmonary Rehab  Referring Provider  Merton Border MD      Encounter Date: 02/12/2017  Check In:     Session Check In - 02/12/17 1101      Check-In   Location [facility]-Cardiac & Pulmonary Rehab   Physician(s) Dr. Mable Paris and Dr. Corky Downs         History  Smoking Status  . Former Smoker  . Packs/day: 1.50  . Years: 53.00  . Types: Cigarettes  . Quit date: 08/22/2014  Smokeless Tobacco  . Never Used    Comment: NOV 16,2015    Goals Met:  Proper associated with RPD/PD & O2 Sat Exercise tolerated well Strength training completed today  Goals Unmet:  Not Applicable  Comments:     Dr. Emily Filbert is Medical Director for [place] and [name].

## 2017-02-12 NOTE — Progress Notes (Signed)
Daily Session Note  Patient Details  Name: [name] MRN: [id number] Date of Birth: 1950/12/18 Referring Provider:     Pulmonary Rehab from 12/28/2016 in [facility] Cardiac and Pulmonary Rehab  Referring Provider  Merton Border MD      Encounter Date: 02/12/2017  Check In:     Session Check In - 02/12/17 1056      Check-In   Location [facility]-Cardiac & Pulmonary Rehab   Staff Present Gerlene Burdock, RN, Vickki Hearing, BA, ACSM CEP, Exercise Physiologist;Joseph Flavia Shipper   Supervising physician immediately available to respond to emergencies See telemetry face sheet for immediately available ER MD   Medication changes reported     No   Fall or balance concerns reported    No   Tobacco Cessation No Change   Warm-up and Cool-down Performed as group-led instruction   Resistance Training Performed Yes   VAD Patient? No     Pain Assessment   Currently in Pain? No/denies         History  Smoking Status  . Former Smoker  . Packs/day: 1.50  . Years: 53.00  . Types: Cigarettes  . Quit date: 08/22/2014  Smokeless Tobacco  . Never Used    Comment: NOV 16,2015    Goals Met:  Proper associated with RPD/PD & O2 Sat Exercise tolerated well Strength training completed today  Goals Unmet:  Not Applicable  Comments:     Dr. Emily Filbert is Medical Director for [facility] and [name].

## 2017-02-15 DIAGNOSIS — M8580 Other specified disorders of bone density and structure, unspecified site: Secondary | ICD-10-CM | POA: Diagnosis not present

## 2017-02-15 DIAGNOSIS — Z87891 Personal history of nicotine dependence: Secondary | ICD-10-CM | POA: Diagnosis not present

## 2017-02-15 DIAGNOSIS — Z87442 Personal history of urinary calculi: Secondary | ICD-10-CM | POA: Diagnosis not present

## 2017-02-15 DIAGNOSIS — K5090 Crohn's disease, unspecified, without complications: Secondary | ICD-10-CM | POA: Diagnosis not present

## 2017-02-15 DIAGNOSIS — E039 Hypothyroidism, unspecified: Secondary | ICD-10-CM | POA: Diagnosis not present

## 2017-02-15 DIAGNOSIS — Z79899 Other long term (current) drug therapy: Secondary | ICD-10-CM | POA: Diagnosis not present

## 2017-02-15 DIAGNOSIS — M1990 Unspecified osteoarthritis, unspecified site: Secondary | ICD-10-CM | POA: Diagnosis not present

## 2017-02-15 DIAGNOSIS — J449 Chronic obstructive pulmonary disease, unspecified: Secondary | ICD-10-CM

## 2017-02-15 DIAGNOSIS — I10 Essential (primary) hypertension: Secondary | ICD-10-CM | POA: Diagnosis not present

## 2017-02-15 NOTE — Progress Notes (Signed)
Daily Session Note  Patient Details  Name: Joanna Rodriguez MRN: [id number] Date of Birth: 10-Feb-1951 Referring Provider:     Pulmonary Rehab from 12/28/2016 in [facility] Cardiac and Pulmonary Rehab  Referring Provider  Merton Border MD      Encounter Date: 02/15/2017  Check In:     Session Check In - 02/15/17 1032      Check-In   Location [facility]-Cardiac & Pulmonary Rehab   Staff Present Gerlene Burdock, RN, Moises Blood, BS, ACSM CEP, Exercise Physiologist;Alaa Mullally Flavia Shipper   Supervising physician immediately available to respond to emergencies [name] immediately available ER MD   Physician(s) Dr. Mable Paris and Jimmye Norman   Medication changes reported     No   Fall or balance concerns reported    No   Warm-up and Cool-down Performed as group-led instruction   Resistance Training Performed Yes   VAD Patient? No     Pain Assessment   Currently in Pain? No/denies   Multiple Pain Sites No         History  Smoking Status  . Former Smoker  . Packs/day: 1.50  . Years: 53.00  . Types: Cigarettes  . Quit date: 08/22/2014  Smokeless Tobacco  . Never Used    Comment: NOV 16,2015    Goals Met:  Proper associated with RPD/PD & O2 Sat Independence with exercise equipment Exercise tolerated well No report of cardiac concerns or symptoms Strength training completed today  Goals Unmet:  Not Applicable  Comments: Pt able to follow exercise prescription today without complaint.  Will continue to monitor for progression.   Dr. Emily Filbert is Medical Director for [facility] and [name].

## 2017-02-17 DIAGNOSIS — Z79899 Other long term (current) drug therapy: Secondary | ICD-10-CM | POA: Diagnosis not present

## 2017-02-17 DIAGNOSIS — Z87442 Personal history of urinary calculi: Secondary | ICD-10-CM | POA: Diagnosis not present

## 2017-02-17 DIAGNOSIS — M8580 Other specified disorders of bone density and structure, unspecified site: Secondary | ICD-10-CM | POA: Diagnosis not present

## 2017-02-17 DIAGNOSIS — E039 Hypothyroidism, unspecified: Secondary | ICD-10-CM | POA: Diagnosis not present

## 2017-02-17 DIAGNOSIS — J449 Chronic obstructive pulmonary disease, unspecified: Secondary | ICD-10-CM | POA: Diagnosis not present

## 2017-02-17 DIAGNOSIS — K5090 Crohn's disease, unspecified, without complications: Secondary | ICD-10-CM | POA: Diagnosis not present

## 2017-02-17 DIAGNOSIS — I10 Essential (primary) hypertension: Secondary | ICD-10-CM | POA: Diagnosis not present

## 2017-02-17 DIAGNOSIS — Z87891 Personal history of nicotine dependence: Secondary | ICD-10-CM | POA: Diagnosis not present

## 2017-02-17 DIAGNOSIS — M1990 Unspecified osteoarthritis, unspecified site: Secondary | ICD-10-CM | POA: Diagnosis not present

## 2017-02-17 NOTE — Progress Notes (Signed)
Daily Session Note  Patient Details  Name: Joanna Rodriguez MRN: [id number] Date of Birth: 11/02/1950 Referring Provider:     Pulmonary Rehab from 12/28/2016 in [facility] Cardiac and Pulmonary Rehab  Referring Provider  Simonds, David MD      Encounter Date: 02/17/2017  Check In:     Session Check In - 02/17/17 1026      Check-In   Location [facility]-Cardiac & Pulmonary Rehab   Staff Present Jessica Hawkins, MA, ACSM RCEP, Exercise Physiologist;Amanda Sommer, BA, ACSM CEP, Exercise Physiologist;Joseph Hood RCP,RRT,BSRT   Supervising physician immediately available to respond to emergencies [name] immediately available ER MD   Physician(s) Dr. Williams and Norman   Medication changes reported     No   Fall or balance concerns reported    No   Warm-up and Cool-down Performed as group-led instruction   Resistance Training Performed Yes   VAD Patient? No     Pain Assessment   Currently in Pain? No/denies   Multiple Pain Sites No         History  Smoking Status  . Former Smoker  . Packs/day: 1.50  . Years: 53.00  . Types: Cigarettes  . Quit date: 08/22/2014  Smokeless Tobacco  . Never Used    Comment: NOV 16,2015    Goals Met:  Proper associated with RPD/PD & O2 Sat Independence with exercise equipment Exercise tolerated well No report of cardiac concerns or symptoms Strength training completed today  Goals Unmet:  Not Applicable  Comments: Pt able to follow exercise prescription today without complaint.  Will continue to monitor for progression.   Dr. Mark Miller is Medical Director for [facility] and [name]. 

## 2017-02-19 ENCOUNTER — Encounter: Payer: Medicare HMO | Admitting: *Deleted

## 2017-02-19 DIAGNOSIS — J449 Chronic obstructive pulmonary disease, unspecified: Secondary | ICD-10-CM

## 2017-02-19 DIAGNOSIS — K5090 Crohn's disease, unspecified, without complications: Secondary | ICD-10-CM | POA: Diagnosis not present

## 2017-02-19 DIAGNOSIS — E039 Hypothyroidism, unspecified: Secondary | ICD-10-CM | POA: Diagnosis not present

## 2017-02-19 DIAGNOSIS — I10 Essential (primary) hypertension: Secondary | ICD-10-CM | POA: Diagnosis not present

## 2017-02-19 DIAGNOSIS — Z87891 Personal history of nicotine dependence: Secondary | ICD-10-CM | POA: Diagnosis not present

## 2017-02-19 DIAGNOSIS — M1990 Unspecified osteoarthritis, unspecified site: Secondary | ICD-10-CM | POA: Diagnosis not present

## 2017-02-19 DIAGNOSIS — M8580 Other specified disorders of bone density and structure, unspecified site: Secondary | ICD-10-CM | POA: Diagnosis not present

## 2017-02-19 DIAGNOSIS — Z87442 Personal history of urinary calculi: Secondary | ICD-10-CM | POA: Diagnosis not present

## 2017-02-19 DIAGNOSIS — Z79899 Other long term (current) drug therapy: Secondary | ICD-10-CM | POA: Diagnosis not present

## 2017-02-19 NOTE — Progress Notes (Signed)
Daily Session Note  Patient Details  Name: Arlynn Stare MRN: [id number] Date of Birth: 1950-08-27 Referring Provider:     Pulmonary Rehab from 12/28/2016 in [facility] Cardiac and Pulmonary Rehab  Referring Provider  Merton Border MD      Encounter Date: 02/19/2017  Check In:     Session Check In - 02/19/17 1014      Check-In   Location [facility]-Cardiac & Pulmonary Rehab   Staff Present Gerlene Burdock, RN, Vickki Hearing, BA, ACSM CEP, Exercise Physiologist;Joseph Flavia Shipper   Supervising physician immediately available to respond to emergencies [name] immediately available ER MD   Physician(s) Dr. Burlene Arnt and Alfred Levins   Medication changes reported     No   Fall or balance concerns reported    No   Tobacco Cessation No Change   Warm-up and Cool-down Performed as group-led instruction   Resistance Training Performed Yes   VAD Patient? No     Pain Assessment   Currently in Pain? No/denies         History  Smoking Status  . Former Smoker  . Packs/day: 1.50  . Years: 53.00  . Types: Cigarettes  . Quit date: 08/22/2014  Smokeless Tobacco  . Never Used    Comment: NOV 16,2015    Goals Met:  Proper associated with RPD/PD & O2 Sat Personal goals reviewed Strength training completed today  Goals Unmet:  Not Applicable  Comments:     Dr. Emily Filbert is Medical Director for [facility] and [name].

## 2017-02-22 ENCOUNTER — Encounter: Payer: Medicare HMO | Admitting: *Deleted

## 2017-02-22 DIAGNOSIS — J449 Chronic obstructive pulmonary disease, unspecified: Secondary | ICD-10-CM

## 2017-02-22 DIAGNOSIS — M8580 Other specified disorders of bone density and structure, unspecified site: Secondary | ICD-10-CM | POA: Diagnosis not present

## 2017-02-22 DIAGNOSIS — E039 Hypothyroidism, unspecified: Secondary | ICD-10-CM | POA: Diagnosis not present

## 2017-02-22 DIAGNOSIS — Z79899 Other long term (current) drug therapy: Secondary | ICD-10-CM | POA: Diagnosis not present

## 2017-02-22 DIAGNOSIS — K5090 Crohn's disease, unspecified, without complications: Secondary | ICD-10-CM | POA: Diagnosis not present

## 2017-02-22 DIAGNOSIS — M1990 Unspecified osteoarthritis, unspecified site: Secondary | ICD-10-CM | POA: Diagnosis not present

## 2017-02-22 DIAGNOSIS — Z87891 Personal history of nicotine dependence: Secondary | ICD-10-CM | POA: Diagnosis not present

## 2017-02-22 DIAGNOSIS — I10 Essential (primary) hypertension: Secondary | ICD-10-CM | POA: Diagnosis not present

## 2017-02-22 DIAGNOSIS — Z87442 Personal history of urinary calculi: Secondary | ICD-10-CM | POA: Diagnosis not present

## 2017-02-22 NOTE — Progress Notes (Signed)
Pulmonary Individual Treatment Plan  Patient Details  Name: Joanna Rodriguez MRN: [id number] Date of Birth: 1951-05-26 Referring Provider:     Pulmonary Rehab from 12/28/2016 in [facility] Cardiac and Pulmonary Rehab  Referring Provider  Merton Border MD      Initial Encounter Date:    Pulmonary Rehab from 12/28/2016 in [facility] Cardiac and Pulmonary Rehab  Date  12/28/16  Referring Provider  Merton Border MD      Visit Diagnosis: Chronic obstructive pulmonary disease, unspecified COPD type ([place])  Patient's Home Medications on Admission:  Current Outpatient Prescriptions:  .  acyclovir (ZOVIRAX) 400 MG tablet, Take 1 tablet (400 mg total) by mouth daily., Disp: 90 tablet, Rfl: 3 .  Alum & Mag Hydroxide-Simeth (ANTACID ANTI-GAS MAX STRENGTH PO), Take 1 each by mouth daily. Reported on 11/21/2015, Disp: , Rfl:  .  aspirin EC 81 MG tablet, Take 81 mg by mouth daily., Disp: , Rfl:  .  B Complex-Folic Acid (B COMPLEX-VITAMIN B12 PO), Take 1 tablet by mouth daily., Disp: , Rfl:  .  BLACK COHOSH PO, Take by mouth daily., Disp: , Rfl:  .  buPROPion (WELLBUTRIN SR) 150 MG 12 hr tablet, 1 tab daily for 1 week, then 1 tab BID, Disp: 180 tablet, Rfl: 3 .  Calcium Carb-Cholecalciferol 600-800 MG-UNIT TABS, Take by mouth 2 (two) times daily., Disp: , Rfl:  .  Co-Enzyme Q-10 30 MG CAPS, Take 1 capsule by mouth daily., Disp: , Rfl:  .  Glucosamine-Chondroit-Vit C-Mn (SM GLUCOSAMINE/CHONDROITIN PO), Take 1,500 mg by mouth 2 (two) times daily., Disp: , Rfl:  .  levothyroxine (SYNTHROID, LEVOTHROID) 100 MCG tablet, Take 1 tablet (100 mcg total) by mouth daily., Disp: 90 tablet, Rfl: 3 .  lisinopril (PRINIVIL,ZESTRIL) 5 MG tablet, Take 1 tablet (5 mg total) by mouth daily., Disp: 90 tablet, Rfl: 1 .  Melatonin 5 MG TABS, Take 5 mg by mouth at bedtime as needed., Disp: , Rfl:  .  mercaptopurine (PURINETHOL) 50 MG tablet, TAKE 1 AND 1/2 TABLETS EVERY DAY, Disp: 135 tablet, Rfl: 3 .  Multiple Vitamins-Minerals  (MULTIVITAMIN WITH MINERALS) tablet, Take 1 tablet by mouth daily., Disp: , Rfl:  .  Omega-3 Fatty Acids (FISH OIL PO), Take 1,200 mg by mouth 2 (two) times daily., Disp: , Rfl:  .  pantoprazole (PROTONIX) 40 MG tablet, TAKE 1 TABLET EVERY DAY, Disp: 90 tablet, Rfl: 3 .  Tiotropium Bromide Monohydrate (SPIRIVA RESPIMAT) 2.5 MCG/ACT AERS, Inhale 2 puffs into the lungs daily. Gargle and rinse after each use, Disp: 3 Inhaler, Rfl: 3 .  traMADol (ULTRAM) 50 MG tablet, TAKE 1 TABLET THREE TIMES DAILY, Disp: 270 tablet, Rfl: 1  Past Medical History: Past Medical History:  Diagnosis Date  . Arthritis   . Cancer [facility])    face  . COPD (chronic obstructive pulmonary disease) ([place])   . Crohn's disease ([place])   . Dyspnea on exertion   . Genital herpes   . Hypertension   . Hypothyroid   . Kidney stones   . Osteopenia   . Personal history of tobacco use, presenting hazards to health 10/23/2015  . Scoliosis   . Spontaneous pneumothorax   . Vertigo    last episode 11/14/15  . Volvulus ([place])     Tobacco Use: History  Smoking Status  . Former Smoker  . Packs/day: 1.50  . Years: 53.00  . Types: Cigarettes  . Quit date: 08/22/2014  Smokeless Tobacco  . Never Used    Comment: NOV 16,2015  Labs: Recent Review Flowsheet Data    Labs for ITP Cardiac and Pulmonary Rehab Latest Ref Rng & Units 10/28/2015 10/27/2016   Cholestrol 100 - 199 mg/dL 141 166   LDLCALC 0 - 99 mg/dL 49 90   HDL >39 mg/dL 48 43   Trlycerides 0 - 149 mg/dL 221(H) 166(H)       Pulmonary Assessment Scores:     Pulmonary Assessment Scores    Row Name 12/28/16 1636 02/05/17 1029       ADL UCSD   ADL Phase Entry Mid    SOB Score total 39 28    Rest 1 0    Walk 1 0    Stairs 0 3    Bath 0 0    Dress 0 0    Shop 2 0      mMRC Score   mMRC Score 2.5  -       Pulmonary Function Assessment:     Pulmonary Function Assessment - 12/28/16 1625      Pulmonary Function Tests   RV% 116 %   DLCO% 81 %      Initial Spirometry Results   FVC% 57 %   FEV1% 53 %   FEV1/FVC Ratio 69.9     Post Bronchodilator Spirometry Results   FVC% 76 %   FEV1% 68 %   FEV1/FVC Ratio 67.83      Exercise Target Goals:    Exercise Program Goal: Individual exercise prescription set with THRR, safety & activity barriers. Participant demonstrates ability to understand and report RPE using BORG scale, to self-measure pulse accurately, and to acknowledge the importance of the exercise prescription.  Exercise Prescription Goal: Starting with aerobic activity 30 plus minutes a day, 3 days per week for initial exercise prescription. Provide home exercise prescription and guidelines that participant acknowledges understanding prior to discharge.  Activity Barriers & Risk Stratification:     Activity Barriers & Cardiac Risk Stratification - 12/28/16 1623      Activity Barriers & Cardiac Risk Stratification   Activity Barriers Back Problems;Deconditioning;Muscular Weakness;Shortness of Breath  no longer has to take as much tramadol      6 Minute Walk:     6 Minute Walk    Row Name 12/28/16 1622         6 Minute Walk   Phase Initial     Distance 1300 feet     Walk Time 6 minutes     # of Rest Breaks 0     MPH 2.46     METS 3.13     RPE 11     Perceived Dyspnea  1     VO2 Peak 10.94     Symptoms Yes (comment)     Comments slightly SOB     Resting HR 83 bpm     Resting BP 126/64     Max Ex. HR 116 bpm     Max Ex. BP 146/60     2 Minute Post BP 144/56  additional reading 126/64       Interval HR   Baseline HR (retired) 83     1 Minute HR 106     2 Minute HR 122     3 Minute HR 113     4 Minute HR 113     5 Minute HR 115     6 Minute HR 116     2 Minute Post HR 94     Interval Heart Rate? Yes  Interval Oxygen   Interval Oxygen? Yes     Baseline Oxygen Saturation % 96 %     Resting Liters of Oxygen 0 L  Room Air     1 Minute Oxygen Saturation % 94 %     1 Minute Liters of Oxygen  0 L     2 Minute Oxygen Saturation % 95 %     2 Minute Liters of Oxygen 0 L     3 Minute Oxygen Saturation % 92 %     3 Minute Liters of Oxygen 0 L     4 Minute Oxygen Saturation % 93 %     4 Minute Liters of Oxygen 0 L     5 Minute Oxygen Saturation % 94 %     5 Minute Liters of Oxygen 0 L     6 Minute Oxygen Saturation % 94 %     6 Minute Liters of Oxygen 0 L     2 Minute Post Oxygen Saturation % 98 %     2 Minute Post Liters of Oxygen 0 L       Oxygen Initial Assessment:     Oxygen Initial Assessment - 12/28/16 1620      Home Oxygen   Home Oxygen Device None     Intervention   Short Term Goals To learn and understand importance of monitoring SPO2 with pulse oximeter and demonstrate accurate use of the pulse oximeter.;To Learn and understand importance of maintaining oxygen saturations>88%;To learn and demonstrate proper purse lipped breathing techniques or other breathing techniques.;To learn and demonstrate proper use of respiratory medications   Long  Term Goals Maintenance of O2 saturations>88%;Compliance with respiratory medication;Demonstrates proper use of MDI's;Verbalizes importance of monitoring SPO2 with pulse oximeter and return demonstration;Exhibits proper breathing techniques, such as purse lipped breathing or other method taught during program session      Oxygen Re-Evaluation:     Oxygen Re-Evaluation    Row Name 01/11/17 1434 02/04/17 1025           Program Oxygen Prescription   Program Oxygen Prescription None None        Home Oxygen   Home Oxygen Device None None      Sleep Oxygen Prescription None None      Home Exercise Oxygen Prescription None None      Home at Rest Exercise Oxygen Prescription None None        Goals/Expected Outcomes   Short Term Goals To learn and understand importance of monitoring SPO2 with pulse oximeter and demonstrate accurate use of the pulse oximeter.;To Learn and understand importance of maintaining oxygen  saturations>88%;To learn and demonstrate proper purse lipped breathing techniques or other breathing techniques.;To learn and demonstrate proper use of respiratory medications To learn and understand importance of monitoring SPO2 with pulse oximeter and demonstrate accurate use of the pulse oximeter.;To Learn and understand importance of maintaining oxygen saturations>88%;To learn and demonstrate proper purse lipped breathing techniques or other breathing techniques.;To learn and demonstrate proper use of respiratory medications      Long  Term Goals Maintenance of O2 saturations>88%;Exhibits proper breathing techniques, such as purse lipped breathing or other method taught during program session;Verbalizes importance of monitoring SPO2 with pulse oximeter and return demonstration;Demonstrates proper use of MDI's;Compliance with respiratory medication Maintenance of O2 saturations>88%;Compliance with respiratory medication;Demonstrates proper use of MDI's;Exhibits proper breathing techniques, such as purse lipped breathing or other method taught during program session;Verbalizes importance of monitoring SPO2 with pulse oximeter and return demonstration  Comments Patient is doing well since starting the class. Her oxygen levels have been above 90 percent on all machines and walking. Patient is willing to work hard and show initiative when she feels like her exercise routine is to easy. Joanna Rodriguez PLB techniques have been better but not yet mastered. Her oxygen levels have been above 90% every session. She is increasing her levels on the machines she uses to improve her ADL.      Goals/Expected Outcomes Short Term: Practice PLB to be more efficient. Long Term: Be Independent with PLB Short: Increase levels as tolerated on all exercises, practice PLB techniques. Long: Master PLB and increase exercise levels even further.         Oxygen Discharge (Final Oxygen Re-Evaluation):     Oxygen Re-Evaluation -  02/04/17 1025      Program Oxygen Prescription   Program Oxygen Prescription None     Home Oxygen   Home Oxygen Device None   Sleep Oxygen Prescription None   Home Exercise Oxygen Prescription None   Home at Rest Exercise Oxygen Prescription None     Goals/Expected Outcomes   Short Term Goals To learn and understand importance of monitoring SPO2 with pulse oximeter and demonstrate accurate use of the pulse oximeter.;To Learn and understand importance of maintaining oxygen saturations>88%;To learn and demonstrate proper purse lipped breathing techniques or other breathing techniques.;To learn and demonstrate proper use of respiratory medications   Long  Term Goals Maintenance of O2 saturations>88%;Compliance with respiratory medication;Demonstrates proper use of MDI's;Exhibits proper breathing techniques, such as purse lipped breathing or other method taught during program session;Verbalizes importance of monitoring SPO2 with pulse oximeter and return demonstration   Comments Joanna Rodriguez PLB techniques have been better but not yet mastered. Her oxygen levels have been above 90% every session. She is increasing her levels on the machines she uses to improve her ADL.   Goals/Expected Outcomes Short: Increase levels as tolerated on all exercises, practice PLB techniques. Long: Master PLB and increase exercise levels even further.      Initial Exercise Prescription:     Initial Exercise Prescription - 12/28/16 1600      Date of Initial Exercise RX and Referring Provider   Date 12/28/16   Referring Provider Merton Border MD     Treadmill   MPH 2.2   Grade 0.5   Minutes 15   METs 2.84     NuStep   Level 2   SPM 80   Minutes 15   METs 2     REL-XR   Level 1   Speed 50   Minutes 15   METs 2     Prescription Details   Frequency (times per week) 3   Duration Progress to 45 minutes of aerobic exercise without signs/symptoms of physical distress     Intensity   THRR 40-80% of  Max Heartrate 111-138   Ratings of Perceived Exertion 11-13   Perceived Dyspnea 0-4     Progression   Progression Continue to progress workloads to maintain intensity without signs/symptoms of physical distress.     Resistance Training   Training Prescription Yes   Weight 2 lbs   Reps 10-15      Perform Capillary Blood Glucose checks as needed.  Exercise Prescription Changes:     Exercise Prescription Changes    Row Name 01/04/17 1100 01/13/17 1500 01/28/17 1500 02/10/17 1400       Response to Exercise   Blood Pressure (Admit) 128/78 132/72 118/60 112/60  Blood Pressure (Exercise) 130/82 146/64 132/74  -    Blood Pressure (Exit) 118/98 126/64 112/66 118/70    Heart Rate (Admit) 118 bpm 104 bpm 98 bpm 100 bpm    Heart Rate (Exercise) 122 bpm 127 bpm 131 bpm 133 bpm    Heart Rate (Exit) 93 bpm 101 bpm 104 bpm 106 bpm    Oxygen Saturation (Admit) 97 % 94 % 95 % 98 %    Oxygen Saturation (Exercise) 94 % 93 % 93 % 95 %    Oxygen Saturation (Exit) 97 % 96 % 95 % 97 %    Rating of Perceived Exertion (Exercise) 13 12 13 15     Perceived Dyspnea (Exercise) 0 2 3 3     Symptoms  - none none none    Duration  - Continue with 45 min of aerobic exercise without signs/symptoms of physical distress. Continue with 45 min of aerobic exercise without signs/symptoms of physical distress. Continue with 45 min of aerobic exercise without signs/symptoms of physical distress.    Intensity  - THRR unchanged THRR unchanged THRR unchanged      Progression   Progression  -  - Continue to progress workloads to maintain intensity without signs/symptoms of physical distress. Continue to progress workloads to maintain intensity without signs/symptoms of physical distress.    Average METs  -  - 3.6 4.25      Resistance Training   Training Prescription Yes Yes  - Yes    Weight 2 lbs 5 lb  - 5 lb    Reps 10-15 10-15  - 10-15      Interval Training   Interval Training  - No  - No      Treadmill    MPH 2.2 2.6 2.6 2.8    Grade 0.5 0.5 3 4     Minutes 15 15 15 15     METs 2.84 3.17 4.07 4.69      NuStep   Level 2 2 6 6     SPM 80 80 73 67    Minutes 15 15 15 15     METs 2 2.9 3.1 3.8      REL-XR   Level 1  -  -  -    Speed 50  -  -  -    Minutes 15  -  -  -    METs 2  -  -  -      Home Exercise Plan   Plans to continue exercise at  - Home (comment)  walk to start  -  -    Frequency  - Add 1 additional day to program exercise sessions.  -  -    Initial Home Exercises Provided  - 01/11/17  -  -       Exercise Comments:     Exercise Comments    Row Name 01/04/17 1117           Exercise Comments First full day of exercise!  Patient was oriented to gym and equipment including functions, settings, policies, and procedures.  Patient's individual exercise prescription and treatment plan were reviewed.  All starting workloads were established based on the results of the 6 minute walk test done at initial orientation visit.  The plan for exercise progression was also introduced and progression will be customized based on patient's performance and goals          Exercise Goals and Review:     Exercise Goals    Row Name 12/28/16  1627             Exercise Goals   Increase Physical Activity Yes       Intervention Provide advice, education, support and counseling about physical activity/exercise needs.;Develop an individualized exercise prescription for aerobic and resistive training based on initial evaluation findings, risk stratification, comorbidities and participant's personal goals.       Expected Outcomes Achievement of increased cardiorespiratory fitness and enhanced flexibility, muscular endurance and strength shown through measurements of functional capacity and personal statement of participant.       Increase Strength and Stamina Yes       Intervention Provide advice, education, support and counseling about physical activity/exercise needs.;Develop an individualized  exercise prescription for aerobic and resistive training based on initial evaluation findings, risk stratification, comorbidities and participant's personal goals.       Expected Outcomes Achievement of increased cardiorespiratory fitness and enhanced flexibility, muscular endurance and strength shown through measurements of functional capacity and personal statement of participant.          Exercise Goals Re-Evaluation :     Exercise Goals Re-Evaluation    Row Name 01/11/17 1055 01/13/17 1530 01/28/17 1510 02/10/17 1443       Exercise Goal Re-Evaluation   Exercise Goals Review Increase Physical Activity;Increase Strenth and Stamina Increase Physical Activity;Increase Strenth and Stamina Increase Physical Activity;Increase Strenth and Stamina Increase Physical Activity;Increase Strenth and Stamina    Comments Home exercise guidelines were reviewed with patient.  It was recommended for patinet to increase days of physical activity at home when she does not come to class.  Joanna Rodriguez is adding walking at home to her current exercise.  Joanna Rodriguez is  progressing well and has increased her overall MET level. Joanna Rodriguez has progressed well with exercise and has increased her overall MET level.    Expected Outcomes Patient will continue to come to class regularly for pulmonary rehab and add 1-2 days of exercise at home outside of class.  Short - Joanna Rodriguez will exercise regularly in LW and at home.  Long - Joanna Rodriguez will finish the LW program. Short - Joanna Rodriguez will continue to attend regularly and continue to increase overall fitness.  Long - Joanna Rodriguez will maintain her fitness after finishing LW. Short - Joanna Rodriguez will contineu to progress in LW.  Long - Joanna Rodriguez will reach optimal fitness level and maintain it after completing LW.       Discharge Exercise Prescription (Final Exercise Prescription Changes):     Exercise Prescription Changes - 02/10/17 1400      Response to Exercise   Blood Pressure (Admit)  112/60   Blood Pressure (Exit) 118/70   Heart Rate (Admit) 100 bpm   Heart Rate (Exercise) 133 bpm   Heart Rate (Exit) 106 bpm   Oxygen Saturation (Admit) 98 %   Oxygen Saturation (Exercise) 95 %   Oxygen Saturation (Exit) 97 %   Rating of Perceived Exertion (Exercise) 15   Perceived Dyspnea (Exercise) 3   Symptoms none   Duration Continue with 45 min of aerobic exercise without signs/symptoms of physical distress.   Intensity THRR unchanged     Progression   Progression Continue to progress workloads to maintain intensity without signs/symptoms of physical distress.   Average METs 4.25     Resistance Training   Training Prescription Yes   Weight 5 lb   Reps 10-15     Interval Training   Interval Training No     Treadmill   MPH 2.8   Grade  4   Minutes 15   METs 4.69     NuStep   Level 6   SPM 67   Minutes 15   METs 3.8      Nutrition:  Target Goals: Understanding of nutrition guidelines, daily intake of sodium <1550m, cholesterol <2051m calories 30% from fat and 7% or less from saturated fats, daily to have 5 or more servings of fruits and vegetables.  Biometrics:     Pre Biometrics - 12/28/16 1627      Pre Biometrics   Height 5' 0.7" (1.542 m)   Weight 148 lb 6.4 oz (67.3 kg)   Waist Circumference 37 inches   Hip Circumference 39 inches   Waist to Hip Ratio 0.95 %   BMI (Calculated) 28.4       Nutrition Therapy Plan and Nutrition Goals:     Nutrition Therapy & Goals - 01/29/17 1312      Nutrition Therapy   Diet DASH     Personal Nutrition Goals   Personal Goal #2 Continue with current healthy eating pattern and regular exercise   Comments Ms. PeRotenbergs following a healthy eating pattern, incorporating lean protein sources, plenty of vegetables and fruits, and whole grain foods. She is limiting calories to about 1000kcal daily for weight loss. I advised her to continue with multivitamin supplements as well as calcium and B12 to ensure adequate  nutrient intake. Discussed importance of adequate protein in low calorie diets.      Intervention Plan   Intervention Prescribe, educate and counsel regarding individualized specific dietary modifications aiming towards targeted core components such as weight, hypertension, lipid management, diabetes, heart failure and other comorbidities.;Nutrition handout(s) given to patient.   Expected Outcomes Short Term Goal: Understand basic principles of dietary content, such as calories, fat, sodium, cholesterol and nutrients.;Short Term Goal: A plan has been developed with personal nutrition goals set during dietitian appointment.;Long Term Goal: Adherence to prescribed nutrition plan.      Nutrition Discharge: Rate Your Plate Scores:   Nutrition Goals Re-Evaluation:     Nutrition Goals Re-Evaluation    Row Name 01/11/17 1446 02/03/17 1115           Goals   Current Weight  - 144 lb (65.3 kg)      Nutrition Goal Make an appointment with the dietician  -      CoJedditotill would like to lose 20lbs  more      Expected Outcome Patient would like to lose 20lbs  Short: Lose 5 Lbs the next few weeks. Long: Lose 20 lbs by the end of the [facility]         Nutrition Goals Discharge (Final Nutrition Goals Re-Evaluation):     Nutrition Goals Re-Evaluation - 02/03/17 1115      Goals   Current Weight 144 lb (65.3 kg)   Comment Joanna Rodriguez still would like to lose 20lbs  more   Expected Outcome Short: Lose 5 Lbs the next few weeks. Long: Lose 20 lbs by the end of the [name] Program      Psychosocial: Target Goals: Acknowledge presence or absence of significant depression and/or stress, maximize coping skills, provide positive support system. Participant is able to verbalize types and ability to use techniques and skills needed for reducing stress and depression.   Initial Review & Psychosocial Screening:     Initial Psych Review & Screening - 12/28/16 [place]  Yes   Comments Patient is frustrated that she cannot do activities she use to do because of her breathing. Her daughter treats her like she is her mother and she is frustrated when she acts like that to her. Samanth is looking forward to starting [facility].      Barriers   Psychosocial barriers to participate in program The patient should benefit from training in stress management and relaxation.     Screening Interventions   Interventions Encouraged to exercise;Program counselor consult      Quality of Life Scores:     Quality of Life - 12/28/16 1645      Quality of Life Scores   Health/Function Pre 21.97 %   Socioeconomic Pre 23.57 %   Psych/Spiritual Pre 14.5 %   Family Pre 20.5 %   GLOBAL Pre 20.95 %      PHQ-9: Recent Review Flowsheet Data    Depression screen [employer] 2/9 12/28/2016 10/27/2016 08/24/2016 04/22/2016 04/09/2016   Decreased Interest 0 0 0 0 0   Down, Depressed, Hopeless 0 0 0 0 0   PHQ - 2 Score 0 0 0 0 0   Altered sleeping 0 - - - -   Tired, decreased energy 1 - - - -   Change in appetite 2 - - - -   Feeling bad or failure about yourself  0 - - - -   Trouble concentrating 2 - - - -   Moving slowly or fidgety/restless 2 - - - -   Suicidal thoughts 0 - - - -   PHQ-9 Score 7 - - - -   Difficult doing work/chores Very difficult - - - -     Interpretation of Total Score  Total Score Depression Severity:  1-4 = Minimal depression, 5-9 = Mild depression, 10-14 = Moderate depression, 15-19 = Moderately severe depression, 20-27 = Severe depression   Psychosocial Evaluation and Intervention:     Psychosocial Evaluation - 01/04/17 1029      Psychosocial Evaluation & Interventions   Interventions Encouraged to exercise with the program and follow exercise prescription;Stress management education;Relaxation education   Comments Counselor met with Joanna Rodriguez today for initial psychosocial evaluation.  She is a 66 year old who has COPD.   Jimmie has a daughter who lives close by and several other family members locally for support for her.  She reports not sleeping well and has tried Melatonin OTC to help with some success.  She admits to a history of depression and anxiety and is on medication currently for several months that she reports as helpful.  Her mood is generally positive most of the time.  Her stressors are her health and conflict with one of her sisters recently.  Joanna Rodriguez has goals to lose weight and increase her stamina and strength.  She will be followed by staff throughout the course of this program.     Expected Outcomes Joanna Rodriguez will benefit from consistent exercise to achieve her stated goals.  It will also possibly help with her current sleep issues.  Joanna Rodriguez should meet with the dietician to address her weight loss goals. She will also benefit from the psychoeducational components of this program to develop positive coping strategies with her current stressors.  Staff will be following.   Continue Psychosocial Services  Follow up required by staff      Psychosocial Re-Evaluation:     Psychosocial Re-Evaluation    [place] Name 01/25/17 1110 01/27/17 1029  Psychosocial Re-Evaluation   Current issues with History of Depression  -      Comments Counselor follow up with Joanna Rodriguez today reporting progress made on her goals with weight loss and healthier eating choices.  She is noticing less shortness of breath engaging in her daily activities.  Her mood remains positive and she is continuing to sleep well.  Counselor commended Joanna Rodriguez on her progress made and commitment to positive choices concerning her health.   Joanna Rodriguez reports her sleep has improved to at least 6 hours per night since coming into this program.  She is feeling better overall and reports this program has been so helpful for her.  She continues to have stress in her life but is approaching it with a positive attitude.  Joanna Rodriguez reports her  mood has improved along with her sleep.  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         Psychosocial Discharge (Final Psychosocial Re-Evaluation):     Psychosocial Re-Evaluation - 01/27/17 1029      Psychosocial Re-Evaluation   Comments Joanna Rodriguez reports her sleep has improved to at least 6 hours per night since coming into this program.  She is feeling better overall and reports this program has been so helpful for her.  She continues to have stress in her life but is approaching it with a positive attitude.  Joanna Rodriguez reports her mood has improved along with her sleep.  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      Education: Education Goals: Education classes will be provided on a weekly basis, covering required topics. Participant will state understanding/return demonstration of topics presented.  Learning Barriers/Preferences:     Learning Barriers/Preferences - 12/28/16 1622      Learning Barriers/Preferences   Learning Barriers None      Education Topics: Initial Evaluation Education: - Verbal, written and demonstration of respiratory meds, RPE/PD scales, oximetry and breathing techniques. Instruction on use of nebulizers and MDIs: cleaning and proper use, rinsing mouth with steroid doses and importance of monitoring MDI activations.   Pulmonary Rehab from 02/17/2017 in [facility] Cardiac and Pulmonary Rehab  Date  12/28/16  Educator  [facility]  Instruction Review Code (retired)  2- meets goals/outcomes      General Nutrition Guidelines/Fats and Fiber: -Group instruction provided by verbal, written material, models and posters to present the general guidelines for heart healthy nutrition. Gives an explanation and review of dietary fats and fiber.   Pulmonary Rehab from 02/17/2017 in [facility] Cardiac and Pulmonary Rehab  Date  02/15/17  Educator  CR  Instruction Review Code (retired)  2- meets goals/outcomes       Controlling Sodium/Reading Food Labels: -Group verbal and written material supporting the discussion of sodium use in heart healthy nutrition. Review and explanation with models, verbal and written materials for utilization of the food label.   Exercise Physiology & Risk Factors: - Group verbal and written instruction with models to review the exercise physiology of the cardiovascular system and associated critical values. Details cardiovascular disease risk factors and the goals associated with each risk factor.   Pulmonary Rehab from 02/17/2017 in [facility] Cardiac and Pulmonary Rehab  Date  01/06/17  Educator  AS  Instruction Review Code (retired)  2- meets goals/outcomes      Aerobic Exercise & Resistance Training: - Gives group verbal and written discussion on the health impact of inactivity. On the components of aerobic and resistive  training programs and the benefits of this training and how to safely progress through these programs.   Pulmonary Rehab from 02/17/2017 in [facility] Cardiac and Pulmonary Rehab  Date  01/22/17  Educator  [facility]  Instruction Review Code (retired)  2- Statistician, Balance, General Exercise Guidelines: - Provides group verbal and written instruction on the benefits of flexibility and balance training programs. Provides general exercise guidelines with specific guidelines to those with heart or lung disease. Demonstration and skill practice provided.   Pulmonary Rehab from 02/17/2017 in [facility] Cardiac and Pulmonary Rehab  Date  02/10/17  Educator  AS  Instruction Review Code (retired)  2- meets goals/outcomes      Stress Management: - Provides group verbal and written instruction about the health risks of elevated stress, cause of high stress, and healthy ways to reduce stress.   Depression: - Provides group verbal and written instruction on the correlation between heart/lung disease and depressed mood, treatment options, and the  stigmas associated with seeking treatment.   Pulmonary Rehab from 02/17/2017 in [facility] Cardiac and Pulmonary Rehab  Date  02/17/17  Educator  [facility]  Instruction Review Code (retired)  2- meets goals/outcomes      Exercise & Equipment Safety: - Individual verbal instruction and demonstration of equipment use and safety with use of the equipment.   Pulmonary Rehab from 02/17/2017 in [facility] Cardiac and Pulmonary Rehab  Date  01/04/17  Educator  [facility]  Instruction Review Code (retired)  2- meets goals/outcomes      Infection Prevention: - Provides verbal and written material to individual with discussion of infection control including proper hand washing and proper equipment cleaning during exercise session.   Pulmonary Rehab from 02/17/2017 in [facility] Cardiac and Pulmonary Rehab  Date  01/04/17  Educator  [facility]  Instruction Review Code (retired)  2- meets [employer] Prevention: - Provides verbal and written material to individual with discussion of falls prevention and safety.   Pulmonary Rehab from 02/17/2017 in [facility] Cardiac and Pulmonary Rehab  Date  12/28/16  Educator  [facility]  Instruction Review Code (retired)  2- meets goals/outcomes      Diabetes: - Individual verbal and written instruction to review signs/symptoms of diabetes, desired ranges of glucose level fasting, after meals and with exercise. Advice that pre and post exercise glucose checks will be done for 3 sessions at entry of program.   Chronic Lung Diseases: - Group verbal and written instruction to review new updates, new respiratory medications, new advancements in procedures and treatments. Provide informative websites and "800" numbers of self-education.   Pulmonary Rehab from 02/17/2017 in [facility] Cardiac and Pulmonary Rehab  Date  01/13/17  Educator  [facility]  Instruction Review Code (retired)  2- meets goals/outcomes      Lung Procedures: - Group verbal and written instruction to describe testing methods done to  diagnose lung disease. Review the outcome of test results. Describe the treatment choices: Pulmonary Function Tests, ABGs and oximetry.   Energy Conservation: - Provide group verbal and written instruction for methods to conserve energy, plan and organize activities. Instruct on pacing techniques, use of adaptive equipment and posture/positioning to relieve shortness of breath.   Triggers: - Group verbal and written instruction to review types of environmental controls: home humidity, furnaces, filters, dust mite/pet prevention, HEPA vacuums. To discuss weather changes, air quality and the benefits of nasal washing.   Pulmonary Rehab from 02/17/2017 in [facility] Cardiac and  Pulmonary Rehab  Date  01/27/17  Educator  [facility]  Instruction Review Code (retired)  2- meets goals/outcomes      Exacerbations: - Group verbal and written instruction to provide: warning signs, infection symptoms, calling MD promptly, preventive modes, and value of vaccinations. Review: effective airway clearance, coughing and/or vibration techniques. Create an Sports administrator.   Pulmonary Rehab from 02/17/2017 in [facility] Cardiac and Pulmonary Rehab  Date  01/27/17  Educator  [facility]  Instruction Review Code (retired)  2- meets goals/outcomes      Oxygen: - Individual and group verbal and written instruction on oxygen therapy. Includes supplement oxygen, available portable oxygen systems, continuous and intermittent flow rates, oxygen safety, concentrators, and Medicare reimbursement for oxygen.   Respiratory Medications: - Group verbal and written instruction to review medications for lung disease. Drug class, frequency, complications, importance of spacers, rinsing mouth after steroid MDI's, and proper cleaning methods for nebulizers.   Pulmonary Rehab from 02/17/2017 in [facility] Cardiac and Pulmonary Rehab  Date  12/28/16  Educator  [facility]  Instruction Review Code (retired)  2- meets goals/outcomes      AED/CPR: - Group verbal and  written instruction with the use of models to demonstrate the basic use of the AED with the basic ABC's of resuscitation.   Breathing Retraining: - Provides individuals verbal and written instruction on purpose, frequency, and proper technique of diaphragmatic breathing and pursed-lipped breathing. Applies individual practice skills.   Pulmonary Rehab from 02/17/2017 in [facility] Cardiac and Pulmonary Rehab  Date  01/04/17  Educator  [facility]  Instruction Review Code (retired)  2- Lawyer and Physiology of the Lungs: - Group verbal and written instruction with the use of models to provide basic lung anatomy and physiology related to function, structure and complications of lung disease.   Anatomy & Physiology of the Heart: - Group verbal and written instruction and models provide basic cardiac anatomy and physiology, with the coronary electrical and arterial systems. Review of: AMI, Angina, Valve disease, Heart Failure, Cardiac Arrhythmia, Pacemakers, and the ICD.   Pulmonary Rehab from 02/17/2017 in [facility] Cardiac and Pulmonary Rehab  Date  02/05/17  Educator  [facility]  Instruction Review Code (retired)  2- meets goals/outcomes      Heart Failure: - Group verbal and written instruction on the basics of heart failure: signs/symptoms, treatments, explanation of ejection fraction, enlarged heart and cardiomyopathy.   Pulmonary Rehab from 02/17/2017 in [facility] Cardiac and Pulmonary Rehab  Date  02/05/17  Educator  [facility]  Instruction Review Code (retired)  2- meets goals/outcomes      Sleep Apnea: - Individual verbal and written instruction to review Obstructive Sleep Apnea. Review of risk factors, methods for diagnosing and types of masks and machines for OSA.   Anxiety: - Provides group, verbal and written instruction on the correlation between heart/lung disease and anxiety, treatment options, and management of anxiety.   Relaxation: - Provides group, verbal and written  instruction about the benefits of relaxation for patients with heart/lung disease. Also provides patients with examples of relaxation techniques.   Pulmonary Rehab from 02/17/2017 in [facility] Cardiac and Pulmonary Rehab  Date  01/20/17  Educator  [facility]  Instruction Review Code (retired)  2- Meets goals/outcomes      Cardiac Medications: - Group verbal and written instruction to review commonly prescribed medications for heart disease. Reviews the medication, class of the drug, and side effects.   Know Your Numbers: -Group verbal and written instruction about  important numbers in your health.  Review of Cholesterol, Blood Pressure, Diabetes, and BMI and the role they play in your overall health.   Other: -Provides group and verbal instruction on various topics (see comments)    Knowledge Questionnaire Score:     Knowledge Questionnaire Score - 12/28/16 1622      Knowledge Questionnaire Score   Pre Score 9/10       Core Components/Risk Factors/Patient Goals at Admission:     Personal Goals and Risk Factors at Admission - 12/28/16 1617      Core Components/Risk Factors/Patient Goals on Admission    Weight Management Yes;Weight Loss   Intervention Weight Management/Obesity: Establish reasonable short term and long term weight goals.;Weight Management: Develop a combined nutrition and exercise program designed to reach desired caloric intake, while maintaining appropriate intake of nutrient and fiber, sodium and fats, and appropriate energy expenditure required for the weight goal.;Weight Management: Provide education and appropriate resources to help participant work on and attain dietary goals.   Admit Weight 148 lb 6.4 oz (67.3 kg)   Goal Weight: Short Term 143 lb (64.9 kg)   Goal Weight: Long Term 125 lb (56.7 kg)   Expected Outcomes Short Term: Continue to assess and modify interventions until short term weight is achieved;Long Term: Adherence to nutrition and physical  activity/exercise program aimed toward attainment of established weight goal;Weight Loss: Understanding of general recommendations for a balanced deficit meal plan, which promotes 1-2 lb weight loss per week and includes a negative energy balance of [phone] kcal/d;Understanding recommendations for meals to include 15-35% energy as protein, 25-35% energy from fat, 35-60% energy from carbohydrates, less than 216m of dietary cholesterol, 20-35 gm of total fiber daily;Understanding of distribution of calorie intake throughout the day with the consumption of 4-5 meals/snacks   Improve shortness of breath with ADL's Yes   Intervention Provide education, individualized exercise plan and daily activity instruction to help decrease symptoms of SOB with activities of daily living.   Expected Outcomes Short Term: Achieves a reduction of symptoms when performing activities of daily living.   Develop more efficient breathing techniques such as purse lipped breathing and diaphragmatic breathing; and practicing self-pacing with activity Yes   Intervention Provide education, demonstration and support about specific breathing techniuqes utilized for more efficient breathing. Include techniques such as pursed lipped breathing, diaphragmatic breathing and self-pacing activity.   Expected Outcomes Short Term: Participant will be able to demonstrate and use breathing techniques as needed throughout daily activities.   Increase knowledge of respiratory medications and ability to use respiratory devices properly  Yes  Spiriva   Intervention Provide education and demonstration as needed of appropriate use of medications, inhalers, and oxygen therapy.   Expected Outcomes Short Term: Achieves understanding of medications use. Understands that oxygen is a medication prescribed by physician. Demonstrates appropriate use of inhaler and oxygen therapy.   Hypertension Yes   Intervention Provide education on lifestyle modifcations  including regular physical activity/exercise, weight management, moderate sodium restriction and increased consumption of fresh fruit, vegetables, and low fat dairy, alcohol moderation, and smoking cessation.;Monitor prescription use compliance.   Expected Outcomes Short Term: Continued assessment and intervention until BP is < 140/963mHG in hypertensive participants. < 130/8029mG in hypertensive participants with diabetes, heart failure or chronic kidney disease.;Long Term: Maintenance of blood pressure at goal levels.      Core Components/Risk Factors/Patient Goals Review:      Goals and Risk Factor Review    Row Name 01/04/17 1108  01/11/17 1427 02/04/17 1017         Core Components/Risk Factors/Patient Goals Review   Personal Goals Review Develop more efficient breathing techniques such as purse lipped breathing and diaphragmatic breathing and practicing self-pacing with activity.;Weight Management/Obesity Weight Management/Obesity Weight Management/Obesity;Improve shortness of breath with ADL's;Hypertension     Review Discussed and demonstrated pursed lip breathing with patient. She demonstrated understanding of how and when to use this breathing technique. Patient does want to meet with the dietician to discuss weight loss goals.  Discussed patients plan for weight loss. Explained to patient that she can make an appointment to see the Nutritionist for guidance on diet. Arlenis has been doing very well in [name] and has lost some weight. She has seen the nutritionist and is making changes to her diet. She has been checking her blood pressure at home regularly.      Expected Outcomes Short: Patinet will use PLB to control SOB during exercise and ADL's. Patient wants to lose 5 lbs.      Long: patient's goal weight is 125 lbs.  Short: Patient wants to lose weight. Patient will set up an appointment to see the Nutritionist. She feels that if she loses weight she can feel better and have more  energy. Long: Patient wants to be 120-125lbs. Patient will work on eating better for [employer]. Short: to lose more weight, Monitor Blood pressure. Long: Reach her weight loss goal of being 120-125lbs. Maintain blood pressure at a stable level        Core Components/Risk Factors/Patient Goals at Discharge (Final Review):      Goals and Risk Factor Review - 02/04/17 1017      Core Components/Risk Factors/Patient Goals Review   Personal Goals Review Weight Management/Obesity;Improve shortness of breath with ADL's;Hypertension   Review Lamona has been doing very well in [name] and has lost some weight. She has seen the nutritionist and is making changes to her diet. She has been checking her blood pressure at home regularly.    Expected Outcomes Short: to lose more weight, Monitor Blood pressure. Long: Reach her weight loss goal of being 120-125lbs. Maintain blood pressure at a stable level      ITP Comments:     ITP Comments    Row Name 01/15/17 1024 01/25/17 0858 02/22/17 0824       ITP Comments Patient attened Education "Know Your Numbers" 30 day review completed ITP sent to Dr. Ramonita Lab for Dr. Emily Filbert Director of [place]. Continue with ITP unless changes are made by physician.  30 day review completed. ITP sent to Dr. Emily Filbert Director of [place]. Continue with ITP unless changes are made by physician.          Comments: 30 day review

## 2017-02-22 NOTE — Progress Notes (Signed)
Daily Session Note  Patient Details  Name: Joanna Rodriguez MRN: [id number] Date of Birth: 29-Nov-1950 Referring Provider:     Pulmonary Rehab from 12/28/2016 in [facility] Cardiac and Pulmonary Rehab  Referring Provider  Merton Border MD      Encounter Date: 02/22/2017  Check In:     Session Check In - 02/22/17 1027      Check-In   Location [facility]-Cardiac & Pulmonary Rehab   Staff Present Justin Mend Jaci Carrel, BS, ACSM CEP, Exercise Physiologist;Amanda Oletta Darter, [place], ACSM CEP, Exercise Physiologist   Supervising physician immediately available to respond to emergencies [name] immediately available ER MD   Physician(s) Dr. Mable [place] and Dr. Jimmye Norman   Medication changes reported     No   Fall or balance concerns reported    No   Warm-up and Cool-down Performed as group-led instruction   Resistance Training Performed Yes   VAD Patient? No     Pain Assessment   Currently in Pain? No/denies   Multiple Pain Sites No         History  Smoking Status  . Former Smoker  . Packs/day: 1.50  . Years: 53.00  . Types: Cigarettes  . Quit date: 08/22/2014  Smokeless Tobacco  . Never Used    Comment: NOV 16,2015    Goals Met:  Proper associated with RPD/PD & O2 Sat Independence with exercise equipment Exercise tolerated well Strength training completed today  Goals Unmet:  Not Applicable  Comments: Pt able to follow exercise prescription today without complaint.  Will continue to monitor for progression.    Dr. Emily Filbert is Medical Director for [place] and [name].

## 2017-02-24 DIAGNOSIS — Z79899 Other long term (current) drug therapy: Secondary | ICD-10-CM | POA: Diagnosis not present

## 2017-02-24 DIAGNOSIS — J449 Chronic obstructive pulmonary disease, unspecified: Secondary | ICD-10-CM

## 2017-02-24 DIAGNOSIS — Z87442 Personal history of urinary calculi: Secondary | ICD-10-CM | POA: Diagnosis not present

## 2017-02-24 DIAGNOSIS — Z87891 Personal history of nicotine dependence: Secondary | ICD-10-CM | POA: Diagnosis not present

## 2017-02-24 DIAGNOSIS — M1990 Unspecified osteoarthritis, unspecified site: Secondary | ICD-10-CM | POA: Diagnosis not present

## 2017-02-24 DIAGNOSIS — I10 Essential (primary) hypertension: Secondary | ICD-10-CM | POA: Diagnosis not present

## 2017-02-24 DIAGNOSIS — M8580 Other specified disorders of bone density and structure, unspecified site: Secondary | ICD-10-CM | POA: Diagnosis not present

## 2017-02-24 DIAGNOSIS — E039 Hypothyroidism, unspecified: Secondary | ICD-10-CM | POA: Diagnosis not present

## 2017-02-24 DIAGNOSIS — K5090 Crohn's disease, unspecified, without complications: Secondary | ICD-10-CM | POA: Diagnosis not present

## 2017-02-24 NOTE — Progress Notes (Signed)
Daily Session Note  Patient Details  Name: Joanna Rodriguez MRN: [id number] Date of Birth: 12-08-1950 Referring Provider:     Pulmonary Rehab from 12/28/2016 in [facility] Cardiac and Pulmonary Rehab  Referring Provider  Merton Border MD      Encounter Date: 02/24/2017  Check In:     Session Check In - 02/24/17 1019      Check-In   Location [facility]-Cardiac & Pulmonary Rehab   Staff Present Alberteen Sam, MA, ACSM RCEP, Exercise Physiologist;Caedan Sumler Oletta Darter, BA, ACSM CEP, Exercise Physiologist;Joseph Flavia Shipper   Supervising physician immediately available to respond to emergencies [name] immediately available ER MD   Physician(s) Jimmye Norman and Mariea Clonts   Medication changes reported     No   Fall or balance concerns reported    No   Warm-up and Cool-down Performed as group-led Location manager Performed Yes   VAD Patient? No     Pain Assessment   Currently in Pain? No/denies         History  Smoking Status  . Former Smoker  . Packs/day: 1.50  . Years: 53.00  . Types: Cigarettes  . Quit date: 08/22/2014  Smokeless Tobacco  . Never Used    Comment: NOV 16,2015    Goals Met:  Proper associated with RPD/PD & O2 Sat Independence with exercise equipment Exercise tolerated well Strength training completed today  Goals Unmet:  Not Applicable  Comments: Pt able to follow exercise prescription today without complaint.  Will continue to monitor for progression.    Dr. Emily Filbert is Medical Director for [place] and [name]monary [facility].

## 2017-02-26 DIAGNOSIS — Z79899 Other long term (current) drug therapy: Secondary | ICD-10-CM | POA: Diagnosis not present

## 2017-02-26 DIAGNOSIS — K5090 Crohn's disease, unspecified, without complications: Secondary | ICD-10-CM | POA: Diagnosis not present

## 2017-02-26 DIAGNOSIS — J449 Chronic obstructive pulmonary disease, unspecified: Secondary | ICD-10-CM | POA: Diagnosis not present

## 2017-02-26 DIAGNOSIS — I10 Essential (primary) hypertension: Secondary | ICD-10-CM | POA: Diagnosis not present

## 2017-02-26 DIAGNOSIS — M8580 Other specified disorders of bone density and structure, unspecified site: Secondary | ICD-10-CM | POA: Diagnosis not present

## 2017-02-26 DIAGNOSIS — M1990 Unspecified osteoarthritis, unspecified site: Secondary | ICD-10-CM | POA: Diagnosis not present

## 2017-02-26 DIAGNOSIS — Z87891 Personal history of nicotine dependence: Secondary | ICD-10-CM | POA: Diagnosis not present

## 2017-02-26 DIAGNOSIS — E039 Hypothyroidism, unspecified: Secondary | ICD-10-CM | POA: Diagnosis not present

## 2017-02-26 DIAGNOSIS — Z87442 Personal history of urinary calculi: Secondary | ICD-10-CM | POA: Diagnosis not present

## 2017-02-26 NOTE — Progress Notes (Signed)
Daily Session Note  Patient Details  Name: Teyona Nichelson MRN: [id number] Date of Birth: 01-22-1951 Referring Provider:     Pulmonary Rehab from 12/28/2016 in [facility] Cardiac and Pulmonary Rehab  Referring Provider  Merton Border MD      Encounter Date: 02/26/2017  Check In:     Session Check In - 02/26/17 1018      Check-In   Staff Present Gerlene Burdock, RN, Vickki Hearing, BA, ACSM CEP, Exercise Physiologist;Jacorian Golaszewski Flavia Shipper   Supervising physician immediately available to respond to emergencies [name] immediately available ER MD   Physician(s) Dr. Burlene Arnt and Alfred Levins   Medication changes reported     No   Fall or balance concerns reported    No   Warm-up and Cool-down Performed as group-led instruction   Resistance Training Performed Yes   VAD Patient? No     Pain Assessment   Currently in Pain? No/denies   Multiple Pain Sites No         History  Smoking Status  . Former Smoker  . Packs/day: 1.50  . Years: 53.00  . Types: Cigarettes  . Quit date: 08/22/2014  Smokeless Tobacco  . Never Used    Comment: NOV 16,2015    Goals Met:  Proper associated with RPD/PD & O2 Sat Independence with exercise equipment Exercise tolerated well No report of cardiac concerns or symptoms Strength training completed today  Goals Unmet:  Not Applicable  Comments: Pt able to follow exercise prescription today without complaint.  Will continue to monitor for progression.   Dr. Emily Filbert is Medical Director for [place] and [name].

## 2017-03-03 ENCOUNTER — Encounter: Payer: Medicare HMO | Attending: Pulmonary Disease

## 2017-03-03 DIAGNOSIS — K5090 Crohn's disease, unspecified, without complications: Secondary | ICD-10-CM | POA: Insufficient documentation

## 2017-03-03 DIAGNOSIS — M419 Scoliosis, unspecified: Secondary | ICD-10-CM | POA: Diagnosis not present

## 2017-03-03 DIAGNOSIS — M1990 Unspecified osteoarthritis, unspecified site: Secondary | ICD-10-CM | POA: Diagnosis not present

## 2017-03-03 DIAGNOSIS — M8580 Other specified disorders of bone density and structure, unspecified site: Secondary | ICD-10-CM | POA: Diagnosis not present

## 2017-03-03 DIAGNOSIS — Z79899 Other long term (current) drug therapy: Secondary | ICD-10-CM | POA: Insufficient documentation

## 2017-03-03 DIAGNOSIS — I10 Essential (primary) hypertension: Secondary | ICD-10-CM | POA: Insufficient documentation

## 2017-03-03 DIAGNOSIS — Z87442 Personal history of urinary calculi: Secondary | ICD-10-CM | POA: Insufficient documentation

## 2017-03-03 DIAGNOSIS — Z87891 Personal history of nicotine dependence: Secondary | ICD-10-CM | POA: Insufficient documentation

## 2017-03-03 DIAGNOSIS — J449 Chronic obstructive pulmonary disease, unspecified: Secondary | ICD-10-CM | POA: Diagnosis not present

## 2017-03-03 DIAGNOSIS — E039 Hypothyroidism, unspecified: Secondary | ICD-10-CM | POA: Insufficient documentation

## 2017-03-03 NOTE — Progress Notes (Signed)
Daily Session Note  Patient Details  Name: Joanna Rodriguez MRN: [id number] Date of Birth: 1950-09-01 Referring Provider:     Pulmonary Rehab from 12/28/2016 in [facility] Cardiac and Pulmonary Rehab  Referring Provider  Merton Border MD      Encounter Date: 03/03/2017  Check In:     Session Check In - 03/03/17 1102      Check-In   Location [facility]-Cardiac & Pulmonary Rehab   Staff Present Alberteen Sam, MA, ACSM RCEP, Exercise Physiologist;Amanda Oletta Darter, BA, ACSM CEP, Exercise Physiologist;Joseph Flavia Shipper   Supervising physician immediately available to respond to emergencies [name] immediately available ER MD   Physician(s) Archie Balboa  and Oswaldo Done   Medication changes reported     No   Fall or balance concerns reported    No   Warm-up and Cool-down Performed as group-led instruction   Resistance Training Performed Yes   VAD Patient? No     Pain Assessment   Currently in Pain? No/denies         History  Smoking Status  . Former Smoker  . Packs/day: 1.50  . Years: 53.00  . Types: Cigarettes  . Quit date: 08/22/2014  Smokeless Tobacco  . Never Used    Comment: NOV 16,2015    Goals Met:  Proper associated with RPD/PD & O2 Sat Independence with exercise equipment Exercise tolerated well Strength training completed today  Goals Unmet:  Not Applicable  Comments: Pt able to follow exercise prescription today without complaint.  Will continue to monitor for progression.    Dr. Emily Filbert is Medical Director for [place] and [name].

## 2017-03-05 ENCOUNTER — Encounter: Payer: Medicare HMO | Admitting: *Deleted

## 2017-03-05 DIAGNOSIS — Z87442 Personal history of urinary calculi: Secondary | ICD-10-CM | POA: Diagnosis not present

## 2017-03-05 DIAGNOSIS — Z79899 Other long term (current) drug therapy: Secondary | ICD-10-CM | POA: Diagnosis not present

## 2017-03-05 DIAGNOSIS — E039 Hypothyroidism, unspecified: Secondary | ICD-10-CM | POA: Diagnosis not present

## 2017-03-05 DIAGNOSIS — M8580 Other specified disorders of bone density and structure, unspecified site: Secondary | ICD-10-CM | POA: Diagnosis not present

## 2017-03-05 DIAGNOSIS — M1990 Unspecified osteoarthritis, unspecified site: Secondary | ICD-10-CM | POA: Diagnosis not present

## 2017-03-05 DIAGNOSIS — Z87891 Personal history of nicotine dependence: Secondary | ICD-10-CM | POA: Diagnosis not present

## 2017-03-05 DIAGNOSIS — J449 Chronic obstructive pulmonary disease, unspecified: Secondary | ICD-10-CM

## 2017-03-05 DIAGNOSIS — K5090 Crohn's disease, unspecified, without complications: Secondary | ICD-10-CM | POA: Diagnosis not present

## 2017-03-05 DIAGNOSIS — I10 Essential (primary) hypertension: Secondary | ICD-10-CM | POA: Diagnosis not present

## 2017-03-05 NOTE — Progress Notes (Signed)
Daily Session Note  Patient Details  Name: Joanna Rodriguez MRN: [id number] Date of Birth: 1950-08-27 Referring Provider:     Pulmonary Rehab from 12/28/2016 in [facility] Cardiac and Pulmonary Rehab  Referring Provider  Merton Border MD      Encounter Date: 03/05/2017  Check In:     Session Check In - 03/05/17 1036      Check-In   Location [facility]-Cardiac & Pulmonary Rehab   Staff Present Gerlene Burdock, RN, Vickki Hearing, BA, ACSM CEP, Exercise Physiologist;Krista Frederico Hamman, RN BSN   Supervising physician immediately available to respond to emergencies [name] immediately available ER MD   Physician(s) Dr. Karma Greaser, Corky Downs   Medication changes reported     No   Fall or balance concerns reported    No   Tobacco Cessation No Change   Warm-up and Cool-down Performed as group-led instruction   Resistance Training Performed Yes   VAD Patient? No     Pain Assessment   Currently in Pain? No/denies         History  Smoking Status  . Former Smoker  . Packs/day: 1.50  . Years: 53.00  . Types: Cigarettes  . Quit date: 08/22/2014  Smokeless Tobacco  . Never Used    Comment: NOV 16,2015    Goals Met:  Proper associated with RPD/PD & O2 Sat Independence with exercise equipment Using PLB without cueing & demonstrates good technique Exercise tolerated well Strength training completed today  Goals Unmet:  Not Applicable  Comments:  Pt able to follow exercise prescription today without complaint.  Will continue to monitor for progression.    Dr. Emily Filbert is Medical Director for [place] and [name].

## 2017-03-08 ENCOUNTER — Encounter: Payer: Medicare HMO | Admitting: *Deleted

## 2017-03-08 DIAGNOSIS — E039 Hypothyroidism, unspecified: Secondary | ICD-10-CM | POA: Diagnosis not present

## 2017-03-08 DIAGNOSIS — J449 Chronic obstructive pulmonary disease, unspecified: Secondary | ICD-10-CM

## 2017-03-08 DIAGNOSIS — Z79899 Other long term (current) drug therapy: Secondary | ICD-10-CM | POA: Diagnosis not present

## 2017-03-08 DIAGNOSIS — M1990 Unspecified osteoarthritis, unspecified site: Secondary | ICD-10-CM | POA: Diagnosis not present

## 2017-03-08 DIAGNOSIS — I10 Essential (primary) hypertension: Secondary | ICD-10-CM | POA: Diagnosis not present

## 2017-03-08 DIAGNOSIS — K5090 Crohn's disease, unspecified, without complications: Secondary | ICD-10-CM | POA: Diagnosis not present

## 2017-03-08 DIAGNOSIS — Z87891 Personal history of nicotine dependence: Secondary | ICD-10-CM | POA: Diagnosis not present

## 2017-03-08 DIAGNOSIS — M8580 Other specified disorders of bone density and structure, unspecified site: Secondary | ICD-10-CM | POA: Diagnosis not present

## 2017-03-08 DIAGNOSIS — Z87442 Personal history of urinary calculi: Secondary | ICD-10-CM | POA: Diagnosis not present

## 2017-03-08 NOTE — Progress Notes (Signed)
Daily Session Note  Patient Details  Name: [name] MRN: [id number] Date of Birth: 04-28-1951 Referring Provider:     Pulmonary Rehab from 12/28/2016 in [facility] Cardiac and Pulmonary Rehab  Referring Provider  Merton Border MD      Encounter Date: 03/08/2017  Check In:     Session Check In - 03/08/17 1016      Check-In   Location [facility]-Cardiac & Pulmonary Rehab   Staff Present Justin Mend RCP,RRT,BSRT;Amanda Oletta Darter, BA, ACSM CEP, Exercise Physiologist;Malai Lady Amedeo Plenty, BS, ACSM CEP, Exercise Physiologist   Supervising physician immediately available to respond to emergencies [name] immediately available ER MD   Physician(s) Dr. Corky Downs and Dr. Jimmye Norman   Medication changes reported     No   Fall or balance concerns reported    No   Warm-up and Cool-down Performed as group-led instruction   Resistance Training Performed Yes   VAD Patient? No     Pain Assessment   Currently in Pain? No/denies   Multiple Pain Sites No         History  Smoking Status  . Former Smoker  . Packs/day: 1.50  . Years: 53.00  . Types: Cigarettes  . Quit date: 08/22/2014  Smokeless Tobacco  . Never Used    Comment: NOV 16,2015    Goals Met:  Proper associated with RPD/PD & O2 Sat Independence with exercise equipment Exercise tolerated well Strength training completed today  Goals Unmet:  Not Applicable  Comments: Pt able to follow exercise prescription today without complaint.  Will continue to monitor for progression.    Dr. Emily Filbert is Medical Director for [place] and [name].

## 2017-03-10 DIAGNOSIS — M8580 Other specified disorders of bone density and structure, unspecified site: Secondary | ICD-10-CM | POA: Diagnosis not present

## 2017-03-10 DIAGNOSIS — K5090 Crohn's disease, unspecified, without complications: Secondary | ICD-10-CM | POA: Diagnosis not present

## 2017-03-10 DIAGNOSIS — J449 Chronic obstructive pulmonary disease, unspecified: Secondary | ICD-10-CM

## 2017-03-10 DIAGNOSIS — E039 Hypothyroidism, unspecified: Secondary | ICD-10-CM | POA: Diagnosis not present

## 2017-03-10 DIAGNOSIS — Z87891 Personal history of nicotine dependence: Secondary | ICD-10-CM | POA: Diagnosis not present

## 2017-03-10 DIAGNOSIS — M1990 Unspecified osteoarthritis, unspecified site: Secondary | ICD-10-CM | POA: Diagnosis not present

## 2017-03-10 DIAGNOSIS — I10 Essential (primary) hypertension: Secondary | ICD-10-CM | POA: Diagnosis not present

## 2017-03-10 DIAGNOSIS — Z79899 Other long term (current) drug therapy: Secondary | ICD-10-CM | POA: Diagnosis not present

## 2017-03-10 DIAGNOSIS — Z87442 Personal history of urinary calculi: Secondary | ICD-10-CM | POA: Diagnosis not present

## 2017-03-10 NOTE — Progress Notes (Signed)
Daily Session Note  Patient Details  Name: Joanna Rodriguez MRN: [id number] Date of Birth: 01/21/1951 Referring Provider:     Pulmonary Rehab from 12/28/2016 in [facility] Cardiac and Pulmonary Rehab  Referring Provider  Merton Border MD      Encounter Date: 03/10/2017  Check In:     Session Check In - 03/10/17 1018      Check-In   Location [facility]-Cardiac & Pulmonary Rehab   Staff Present Alberteen Sam, MA, ACSM RCEP, Exercise Physiologist;Amanda Oletta Darter, BA, ACSM CEP, Exercise Physiologist;Carold Eisner Flavia Shipper   Supervising physician immediately available to respond to emergencies [name] immediately available ER MD   Physician(s) Dr. Alfred Levins and Jimmye Norman   Medication changes reported     No   Fall or balance concerns reported    No   Warm-up and Cool-down Performed as group-led instruction   Resistance Training Performed Yes   VAD Patient? No     Pain Assessment   Currently in Pain? No/denies   Multiple Pain Sites No         History  Smoking Status  . Former Smoker  . Packs/day: 1.50  . Years: 53.00  . Types: Cigarettes  . Quit date: 08/22/2014  Smokeless Tobacco  . Never Used    Comment: NOV 16,2015    Goals Met:  Independence with exercise equipment Exercise tolerated well Personal goals reviewed No report of cardiac concerns or symptoms Strength training completed today  Goals Unmet:  Not Applicable  Comments: Pt able to follow exercise prescription today without complaint.  Will continue to monitor for progression.   Dr. Emily Filbert is Medical Director for [place] and [name].

## 2017-03-15 DIAGNOSIS — Z87891 Personal history of nicotine dependence: Secondary | ICD-10-CM | POA: Diagnosis not present

## 2017-03-15 DIAGNOSIS — E039 Hypothyroidism, unspecified: Secondary | ICD-10-CM | POA: Diagnosis not present

## 2017-03-15 DIAGNOSIS — J449 Chronic obstructive pulmonary disease, unspecified: Secondary | ICD-10-CM | POA: Diagnosis not present

## 2017-03-15 DIAGNOSIS — Z87442 Personal history of urinary calculi: Secondary | ICD-10-CM | POA: Diagnosis not present

## 2017-03-15 DIAGNOSIS — I10 Essential (primary) hypertension: Secondary | ICD-10-CM | POA: Diagnosis not present

## 2017-03-15 DIAGNOSIS — M8580 Other specified disorders of bone density and structure, unspecified site: Secondary | ICD-10-CM | POA: Diagnosis not present

## 2017-03-15 DIAGNOSIS — M1990 Unspecified osteoarthritis, unspecified site: Secondary | ICD-10-CM | POA: Diagnosis not present

## 2017-03-15 DIAGNOSIS — Z79899 Other long term (current) drug therapy: Secondary | ICD-10-CM | POA: Diagnosis not present

## 2017-03-15 DIAGNOSIS — K5090 Crohn's disease, unspecified, without complications: Secondary | ICD-10-CM | POA: Diagnosis not present

## 2017-03-15 NOTE — Progress Notes (Signed)
Daily Session Note  Patient Details  Name: Joanna Rodriguez MRN: [id number] Date of Birth: 1950/07/24 Referring Provider:     Pulmonary Rehab from 12/28/2016 in [facility] Cardiac and Pulmonary Rehab  Referring Provider  Merton Border MD      Encounter Date: 03/15/2017  Check In:     Session Check In - 03/15/17 1007      Check-In   Location [facility]-Cardiac & Pulmonary Rehab   Staff Present Alberteen Sam, MA, ACSM RCEP, Exercise Physiologist;Amanda Oletta Darter, BA, ACSM CEP, Exercise Physiologist;Kelly Amedeo Plenty, BS, ACSM CEP, Exercise Physiologist;Tiffanni Scarfo Flavia Shipper   Supervising physician immediately available to respond to emergencies [name] immediately available ER MD   Physician(s) Dr. Corky Downs and Jimmye Norman   Medication changes reported     No   Fall or balance concerns reported    No   Warm-up and Cool-down Performed as group-led instruction   Resistance Training Performed Yes   VAD Patient? No     Pain Assessment   Currently in Pain? No/denies   Multiple Pain Sites No         History  Smoking Status  . Former Smoker  . Packs/day: 1.50  . Years: 53.00  . Types: Cigarettes  . Quit date: 08/22/2014  Smokeless Tobacco  . Never Used    Comment: NOV 16,2015    Goals Met:  Independence with exercise equipment Exercise tolerated well No report of cardiac concerns or symptoms Strength training completed today  Goals Unmet:  Not Applicable  Comments: Pt able to follow exercise prescription today without complaint.  Will continue to monitor for progression.   Dr. Emily Filbert is Medical Director for [place] and [name].

## 2017-03-17 DIAGNOSIS — J449 Chronic obstructive pulmonary disease, unspecified: Secondary | ICD-10-CM

## 2017-03-17 DIAGNOSIS — Z79899 Other long term (current) drug therapy: Secondary | ICD-10-CM | POA: Diagnosis not present

## 2017-03-17 DIAGNOSIS — K5090 Crohn's disease, unspecified, without complications: Secondary | ICD-10-CM | POA: Diagnosis not present

## 2017-03-17 DIAGNOSIS — I10 Essential (primary) hypertension: Secondary | ICD-10-CM | POA: Diagnosis not present

## 2017-03-17 DIAGNOSIS — E039 Hypothyroidism, unspecified: Secondary | ICD-10-CM | POA: Diagnosis not present

## 2017-03-17 DIAGNOSIS — Z87442 Personal history of urinary calculi: Secondary | ICD-10-CM | POA: Diagnosis not present

## 2017-03-17 DIAGNOSIS — M8580 Other specified disorders of bone density and structure, unspecified site: Secondary | ICD-10-CM | POA: Diagnosis not present

## 2017-03-17 DIAGNOSIS — Z87891 Personal history of nicotine dependence: Secondary | ICD-10-CM | POA: Diagnosis not present

## 2017-03-17 DIAGNOSIS — M1990 Unspecified osteoarthritis, unspecified site: Secondary | ICD-10-CM | POA: Diagnosis not present

## 2017-03-17 NOTE — Progress Notes (Signed)
Daily Session Note  Patient Details  Name: Joanna Rodriguez MRN: [id number] Date of Birth: [birth year] Referring Provider:     Pulmonary Rehab from 12/28/2016 in [facility] Cardiac and Pulmonary Rehab  Referring Provider  Merton Border MD      Encounter Date: 03/17/2017  Check In:     Session Check In - 03/17/17 1017      Check-In   Location [facility]-Cardiac & Pulmonary Rehab   Staff Present Alberteen Sam, MA, ACSM RCEP, Exercise Physiologist;Amanda Oletta Darter, BA, ACSM CEP, Exercise Physiologist;Joseph Flavia Shipper   Supervising physician immediately available to respond to emergencies See telemetry face sheet for immediately available ER MD   Physician(s) [place]   Medication changes reported     No   Fall or balance concerns reported    No   Warm-up and Cool-down Performed as group-led instruction   Resistance Training Performed Yes   VAD Patient? No     Pain Assessment   Currently in Pain? No/denies         History  Smoking Status  . Former Smoker  . Packs/day: 1.50  . Years: 53.00  . Types: Cigarettes  . Quit date: 08/22/2014  Smokeless Tobacco  . Never Used    Comment: NOV 16,2015    Goals Met:  Proper associated with RPD/PD & O2 Sat Independence with exercise equipment Exercise tolerated well Strength training completed today  Goals Unmet:  Not Applicable  Comments: Pt able to follow exercise prescription today without complaint.  Will continue to monitor for progression.    Dr. Emily Filbert is Medical Director for [place] and [name].

## 2017-03-19 DIAGNOSIS — Z87891 Personal history of nicotine dependence: Secondary | ICD-10-CM | POA: Diagnosis not present

## 2017-03-19 DIAGNOSIS — J449 Chronic obstructive pulmonary disease, unspecified: Secondary | ICD-10-CM

## 2017-03-19 DIAGNOSIS — K5090 Crohn's disease, unspecified, without complications: Secondary | ICD-10-CM | POA: Diagnosis not present

## 2017-03-19 DIAGNOSIS — Z87442 Personal history of urinary calculi: Secondary | ICD-10-CM | POA: Diagnosis not present

## 2017-03-19 DIAGNOSIS — I10 Essential (primary) hypertension: Secondary | ICD-10-CM | POA: Diagnosis not present

## 2017-03-19 DIAGNOSIS — M8580 Other specified disorders of bone density and structure, unspecified site: Secondary | ICD-10-CM | POA: Diagnosis not present

## 2017-03-19 DIAGNOSIS — E039 Hypothyroidism, unspecified: Secondary | ICD-10-CM | POA: Diagnosis not present

## 2017-03-19 DIAGNOSIS — Z79899 Other long term (current) drug therapy: Secondary | ICD-10-CM | POA: Diagnosis not present

## 2017-03-19 DIAGNOSIS — M1990 Unspecified osteoarthritis, unspecified site: Secondary | ICD-10-CM | POA: Diagnosis not present

## 2017-03-19 NOTE — Progress Notes (Signed)
Daily Session Note  Patient Details  Name: Joanna Rodriguez MRN: [id number] Date of Birth: 1950/12/04 Referring Provider:     Pulmonary Rehab from 12/28/2016 in [facility] Cardiac and Pulmonary Rehab  Referring Provider  Merton Border MD      Encounter Date: 03/19/2017  Check In:     Session Check In - 03/19/17 1012      Check-In   Location [facility]-Cardiac & Pulmonary Rehab   Staff Present Nada Maclachlan, BA, ACSM CEP, Exercise Physiologist;Carroll Enterkin, RN, BSN;Joseph [employer] physician immediately available to respond to emergencies [name] immediately available ER MD   Physician(s) Dr. Archie Balboa and Joni Fears   Medication changes reported     No   Fall or balance concerns reported    No   Warm-up and Cool-down Performed as group-led instruction   Resistance Training Performed Yes   VAD Patient? No     Pain Assessment   Currently in Pain? No/denies   Multiple Pain Sites No         History  Smoking Status  . Former Smoker  . Packs/day: 1.50  . Years: 53.00  . Types: Cigarettes  . Quit date: 08/22/2014  Smokeless Tobacco  . Never Used    Comment: NOV 16,2015    Goals Met:  Independence with exercise equipment Exercise tolerated well No report of cardiac concerns or symptoms Strength training completed today  Goals Unmet:  Not Applicable  Comments: Pt able to follow exercise prescription today without complaint.  Will continue to monitor for progression.   Dr. Emily Filbert is Medical Director for [place] and [name].

## 2017-03-22 DIAGNOSIS — M8580 Other specified disorders of bone density and structure, unspecified site: Secondary | ICD-10-CM | POA: Diagnosis not present

## 2017-03-22 DIAGNOSIS — J449 Chronic obstructive pulmonary disease, unspecified: Secondary | ICD-10-CM | POA: Diagnosis not present

## 2017-03-22 DIAGNOSIS — K5090 Crohn's disease, unspecified, without complications: Secondary | ICD-10-CM | POA: Diagnosis not present

## 2017-03-22 DIAGNOSIS — Z87442 Personal history of urinary calculi: Secondary | ICD-10-CM | POA: Diagnosis not present

## 2017-03-22 DIAGNOSIS — M1990 Unspecified osteoarthritis, unspecified site: Secondary | ICD-10-CM | POA: Diagnosis not present

## 2017-03-22 DIAGNOSIS — I10 Essential (primary) hypertension: Secondary | ICD-10-CM | POA: Diagnosis not present

## 2017-03-22 DIAGNOSIS — E039 Hypothyroidism, unspecified: Secondary | ICD-10-CM | POA: Diagnosis not present

## 2017-03-22 DIAGNOSIS — Z87891 Personal history of nicotine dependence: Secondary | ICD-10-CM | POA: Diagnosis not present

## 2017-03-22 DIAGNOSIS — Z79899 Other long term (current) drug therapy: Secondary | ICD-10-CM | POA: Diagnosis not present

## 2017-03-22 NOTE — Progress Notes (Signed)
Daily Session Note  Patient Details  Name: Dalaya Suppa MRN: [id number] Date of Birth: 1950/11/28 Referring Provider:     Pulmonary Rehab from 12/28/2016 in [facility] Cardiac and Pulmonary Rehab  Referring Provider  Merton Border MD      Encounter Date: 03/22/2017  Check In:     Session Check In - 03/22/17 1017      Check-In   Location [facility]-Cardiac & Pulmonary Rehab   Staff Present Nada Maclachlan, BA, ACSM CEP, Exercise Physiologist;Kelly Amedeo Plenty, BS, ACSM CEP, Exercise Physiologist;Joseph Flavia Shipper   Supervising physician immediately available to respond to emergencies [name] immediately available ER MD   Physician(s) Dr. Clearnce Hasten and [facility]   Medication changes reported     No   Fall or balance concerns reported    No   Warm-up and Cool-down Performed as group-led instruction   Resistance Training Performed Yes   VAD Patient? No     Pain Assessment   Currently in Pain? No/denies   Multiple Pain Sites No         History  Smoking Status  . Former Smoker  . Packs/day: 1.50  . Years: 53.00  . Types: Cigarettes  . Quit date: 08/22/2014  Smokeless Tobacco  . Never Used    Comment: NOV 16,2015    Goals Met:  Independence with exercise equipment Exercise tolerated well No report of cardiac concerns or symptoms Strength training completed today  Goals Unmet:  Not Applicable  Comments: Pt able to follow exercise prescription today without complaint.  Will continue to monitor for progression.   Dr. Emily Filbert is Medical Director for [place] and [name].

## 2017-03-22 NOTE — Progress Notes (Signed)
Pulmonary Individual Treatment Plan  Patient Details  Name: Joanna Rodriguez MRN: [id number] Date of Birth: 1951-05-26 Referring Provider:     Pulmonary Rehab from 12/28/2016 in [facility] Cardiac and Pulmonary Rehab  Referring Provider  Merton Border MD      Initial Encounter Date:    Pulmonary Rehab from 12/28/2016 in [facility] Cardiac and Pulmonary Rehab  Date  12/28/16  Referring Provider  Merton Border MD      Visit Diagnosis: Chronic obstructive pulmonary disease, unspecified COPD type ([place])  Patient's Home Medications on Admission:  Current Outpatient Prescriptions:  .  acyclovir (ZOVIRAX) 400 MG tablet, Take 1 tablet (400 mg total) by mouth daily., Disp: 90 tablet, Rfl: 3 .  Alum & Mag Hydroxide-Simeth (ANTACID ANTI-GAS MAX STRENGTH PO), Take 1 each by mouth daily. Reported on 11/21/2015, Disp: , Rfl:  .  aspirin EC 81 MG tablet, Take 81 mg by mouth daily., Disp: , Rfl:  .  B Complex-Folic Acid (B COMPLEX-VITAMIN B12 PO), Take 1 tablet by mouth daily., Disp: , Rfl:  .  BLACK COHOSH PO, Take by mouth daily., Disp: , Rfl:  .  buPROPion (WELLBUTRIN SR) 150 MG 12 hr tablet, 1 tab daily for 1 week, then 1 tab BID, Disp: 180 tablet, Rfl: 3 .  Calcium Carb-Cholecalciferol 600-800 MG-UNIT TABS, Take by mouth 2 (two) times daily., Disp: , Rfl:  .  Co-Enzyme Q-10 30 MG CAPS, Take 1 capsule by mouth daily., Disp: , Rfl:  .  Glucosamine-Chondroit-Vit C-Mn (SM GLUCOSAMINE/CHONDROITIN PO), Take 1,500 mg by mouth 2 (two) times daily., Disp: , Rfl:  .  levothyroxine (SYNTHROID, LEVOTHROID) 100 MCG tablet, Take 1 tablet (100 mcg total) by mouth daily., Disp: 90 tablet, Rfl: 3 .  lisinopril (PRINIVIL,ZESTRIL) 5 MG tablet, Take 1 tablet (5 mg total) by mouth daily., Disp: 90 tablet, Rfl: 1 .  Melatonin 5 MG TABS, Take 5 mg by mouth at bedtime as needed., Disp: , Rfl:  .  mercaptopurine (PURINETHOL) 50 MG tablet, TAKE 1 AND 1/2 TABLETS EVERY DAY, Disp: 135 tablet, Rfl: 3 .  Multiple Vitamins-Minerals  (MULTIVITAMIN WITH MINERALS) tablet, Take 1 tablet by mouth daily., Disp: , Rfl:  .  Omega-3 Fatty Acids (FISH OIL PO), Take 1,200 mg by mouth 2 (two) times daily., Disp: , Rfl:  .  pantoprazole (PROTONIX) 40 MG tablet, TAKE 1 TABLET EVERY DAY, Disp: 90 tablet, Rfl: 3 .  Tiotropium Bromide Monohydrate (SPIRIVA RESPIMAT) 2.5 MCG/ACT AERS, Inhale 2 puffs into the lungs daily. Gargle and rinse after each use, Disp: 3 Inhaler, Rfl: 3 .  traMADol (ULTRAM) 50 MG tablet, TAKE 1 TABLET THREE TIMES DAILY, Disp: 270 tablet, Rfl: 1  Past Medical History: Past Medical History:  Diagnosis Date  . Arthritis   . Cancer [facility])    face  . COPD (chronic obstructive pulmonary disease) ([place])   . Crohn's disease ([place])   . Dyspnea on exertion   . Genital herpes   . Hypertension   . Hypothyroid   . Kidney stones   . Osteopenia   . Personal history of tobacco use, presenting hazards to health 10/23/2015  . Scoliosis   . Spontaneous pneumothorax   . Vertigo    last episode 11/14/15  . Volvulus ([place])     Tobacco Use: History  Smoking Status  . Former Smoker  . Packs/day: 1.50  . Years: 53.00  . Types: Cigarettes  . Quit date: 08/22/2014  Smokeless Tobacco  . Never Used    Comment: NOV 16,2015  Labs: Recent Review Flowsheet Data    Labs for ITP Cardiac and Pulmonary Rehab Latest Ref Rng & Units 10/28/2015 10/27/2016   Cholestrol 100 - 199 mg/dL 141 166   LDLCALC 0 - 99 mg/dL 49 90   HDL >39 mg/dL 48 43   Trlycerides 0 - 149 mg/dL 221(H) 166(H)       Pulmonary Assessment Scores:     Pulmonary Assessment Scores    Row Name 12/28/16 1636 02/05/17 1029 03/22/17 1101     ADL UCSD   ADL Phase Entry Mid Exit   SOB Score total 39 28 25   Rest 1 0 0   Walk 1 0 0   Stairs 0 3 1   Bath 0 0 0   Dress 0 0 0   Shop 2 0 0     mMRC Score   mMRC Score 2.5  -  -      Pulmonary Function Assessment:     Pulmonary Function Assessment - 12/28/16 1625      Pulmonary Function Tests   RV% 116 %    DLCO% 81 %     Initial Spirometry Results   FVC% 57 %   FEV1% 53 %   FEV1/FVC Ratio 69.9     Post Bronchodilator Spirometry Results   FVC% 76 %   FEV1% 68 %   FEV1/FVC Ratio 67.83      Exercise Target Goals:    Exercise Program Goal: Individual exercise prescription set with THRR, safety & activity barriers. Participant demonstrates ability to understand and report RPE using BORG scale, to self-measure pulse accurately, and to acknowledge the importance of the exercise prescription.  Exercise Prescription Goal: Starting with aerobic activity 30 plus minutes a day, 3 days per week for initial exercise prescription. Provide home exercise prescription and guidelines that participant acknowledges understanding prior to discharge.  Activity Barriers & Risk Stratification:     Activity Barriers & Cardiac Risk Stratification - 12/28/16 1623      Activity Barriers & Cardiac Risk Stratification   Activity Barriers Back Problems;Deconditioning;Muscular Weakness;Shortness of Breath  no longer has to take as much tramadol      6 Minute Walk:     6 Minute Walk    Row Name 12/28/16 1622         6 Minute Walk   Phase Initial     Distance 1300 feet     Walk Time 6 minutes     # of Rest Breaks 0     MPH 2.46     METS 3.13     RPE 11     Perceived Dyspnea  1     VO2 Peak 10.94     Symptoms Yes (comment)     Comments slightly SOB     Resting HR 83 bpm     Resting BP 126/64     Max Ex. HR 116 bpm     Max Ex. BP 146/60     2 Minute Post BP 144/56  additional reading 126/64       Interval HR   Baseline HR (retired) 83     1 Minute HR 106     2 Minute HR 122     3 Minute HR 113     4 Minute HR 113     5 Minute HR 115     6 Minute HR 116     2 Minute Post HR 94     Interval Heart Rate? Yes  Interval Oxygen   Interval Oxygen? Yes     Baseline Oxygen Saturation % 96 %     Resting Liters of Oxygen 0 L  Room Air     1 Minute Oxygen Saturation % 94 %     1  Minute Liters of Oxygen 0 L     2 Minute Oxygen Saturation % 95 %     2 Minute Liters of Oxygen 0 L     3 Minute Oxygen Saturation % 92 %     3 Minute Liters of Oxygen 0 L     4 Minute Oxygen Saturation % 93 %     4 Minute Liters of Oxygen 0 L     5 Minute Oxygen Saturation % 94 %     5 Minute Liters of Oxygen 0 L     6 Minute Oxygen Saturation % 94 %     6 Minute Liters of Oxygen 0 L     2 Minute Post Oxygen Saturation % 98 %     2 Minute Post Liters of Oxygen 0 L       Oxygen Initial Assessment:     Oxygen Initial Assessment - 12/28/16 1620      Home Oxygen   Home Oxygen Device None     Intervention   Short Term Goals To learn and understand importance of monitoring SPO2 with pulse oximeter and demonstrate accurate use of the pulse oximeter.;To Learn and understand importance of maintaining oxygen saturations>88%;To learn and demonstrate proper purse lipped breathing techniques or other breathing techniques.;To learn and demonstrate proper use of respiratory medications   Long  Term Goals Maintenance of O2 saturations>88%;Compliance with respiratory medication;Demonstrates proper use of MDI's;Verbalizes importance of monitoring SPO2 with pulse oximeter and return demonstration;Exhibits proper breathing techniques, such as purse lipped breathing or other method taught during program session      Oxygen Re-Evaluation:     Oxygen Re-Evaluation    Row Name 01/11/17 1434 02/04/17 1025           Program Oxygen Prescription   Program Oxygen Prescription None None        Home Oxygen   Home Oxygen Device None None      Sleep Oxygen Prescription None None      Home Exercise Oxygen Prescription None None      Home at Rest Exercise Oxygen Prescription None None        Goals/Expected Outcomes   Short Term Goals To learn and understand importance of monitoring SPO2 with pulse oximeter and demonstrate accurate use of the pulse oximeter.;To Learn and understand importance of  maintaining oxygen saturations>88%;To learn and demonstrate proper purse lipped breathing techniques or other breathing techniques.;To learn and demonstrate proper use of respiratory medications To learn and understand importance of monitoring SPO2 with pulse oximeter and demonstrate accurate use of the pulse oximeter.;To Learn and understand importance of maintaining oxygen saturations>88%;To learn and demonstrate proper purse lipped breathing techniques or other breathing techniques.;To learn and demonstrate proper use of respiratory medications      Long  Term Goals Maintenance of O2 saturations>88%;Exhibits proper breathing techniques, such as purse lipped breathing or other method taught during program session;Verbalizes importance of monitoring SPO2 with pulse oximeter and return demonstration;Demonstrates proper use of MDI's;Compliance with respiratory medication Maintenance of O2 saturations>88%;Compliance with respiratory medication;Demonstrates proper use of MDI's;Exhibits proper breathing techniques, such as purse lipped breathing or other method taught during program session;Verbalizes importance of monitoring SPO2 with pulse oximeter and return demonstration  Comments Patient is doing well since starting the class. Her oxygen levels have been above 90 percent on all machines and walking. Patient is willing to work hard and show initiative when she feels like her exercise routine is to easy. Chrystals PLB techniques have been better but not yet mastered. Her oxygen levels have been above 90% every session. She is increasing her levels on the machines she uses to improve her ADL.      Goals/Expected Outcomes Short Term: Practice PLB to be more efficient. Long Term: Be Independent with PLB Short: Increase levels as tolerated on all exercises, practice PLB techniques. Long: Master PLB and increase exercise levels even further.         Oxygen Discharge (Final Oxygen Re-Evaluation):      Oxygen Re-Evaluation - 02/04/17 1025      Program Oxygen Prescription   Program Oxygen Prescription None     Home Oxygen   Home Oxygen Device None   Sleep Oxygen Prescription None   Home Exercise Oxygen Prescription None   Home at Rest Exercise Oxygen Prescription None     Goals/Expected Outcomes   Short Term Goals To learn and understand importance of monitoring SPO2 with pulse oximeter and demonstrate accurate use of the pulse oximeter.;To Learn and understand importance of maintaining oxygen saturations>88%;To learn and demonstrate proper purse lipped breathing techniques or other breathing techniques.;To learn and demonstrate proper use of respiratory medications   Long  Term Goals Maintenance of O2 saturations>88%;Compliance with respiratory medication;Demonstrates proper use of MDI's;Exhibits proper breathing techniques, such as purse lipped breathing or other method taught during program session;Verbalizes importance of monitoring SPO2 with pulse oximeter and return demonstration   Comments Chrystals PLB techniques have been better but not yet mastered. Her oxygen levels have been above 90% every session. She is increasing her levels on the machines she uses to improve her ADL.   Goals/Expected Outcomes Short: Increase levels as tolerated on all exercises, practice PLB techniques. Long: Master PLB and increase exercise levels even further.      Initial Exercise Prescription:     Initial Exercise Prescription - 12/28/16 1600      Date of Initial Exercise RX and Referring Provider   Date 12/28/16   Referring Provider Merton Border MD     Treadmill   MPH 2.2   Grade 0.5   Minutes 15   METs 2.84     NuStep   Level 2   SPM 80   Minutes 15   METs 2     REL-XR   Level 1   Speed 50   Minutes 15   METs 2     Prescription Details   Frequency (times per week) 3   Duration Progress to 45 minutes of aerobic exercise without signs/symptoms of physical distress      Intensity   THRR 40-80% of Max Heartrate 111-138   Ratings of Perceived Exertion 11-13   Perceived Dyspnea 0-4     Progression   Progression Continue to progress workloads to maintain intensity without signs/symptoms of physical distress.     Resistance Training   Training Prescription Yes   Weight 2 lbs   Reps 10-15      Perform Capillary Blood Glucose checks as needed.  Exercise Prescription Changes:      Exercise Prescription Changes    Row Name 01/04/17 1100 01/13/17 1500 01/28/17 1500 02/10/17 1400 02/24/17 1300     Response to Exercise   Blood Pressure (Admit) 128/78 132/72 118/60 112/60  112/62   Blood Pressure (Exercise) 130/82 146/64 132/74  -  -   Blood Pressure (Exit) 118/98 126/64 112/66 118/70 128/72   Heart Rate (Admit) 118 bpm 104 bpm 98 bpm 100 bpm 108 bpm   Heart Rate (Exercise) 122 bpm 127 bpm 131 bpm 133 bpm 122 bpm   Heart Rate (Exit) 93 bpm 101 bpm 104 bpm 106 bpm 110 bpm   Oxygen Saturation (Admit) 97 % 94 % 95 % 98 % 95 %   Oxygen Saturation (Exercise) 94 % 93 % 93 % 95 % 93 %   Oxygen Saturation (Exit) 97 % 96 % 95 % 97 % 96 %   Rating of Perceived Exertion (Exercise) _0 Perceived Dyspnea (Exercise) 0 _1 Symptoms  - none none none none   Duration  - Continue with 45 min of aerobic exercise without signs/symptoms of physical distress. Continue with 45 min of aerobic exercise without signs/symptoms of physical distress. Continue with 45 min of aerobic exercise without signs/symptoms of physical distress. Continue with 45 min of aerobic exercise without signs/symptoms of physical distress.   Intensity  - THRR unchanged THRR unchanged THRR unchanged THRR unchanged     Progression   Progression  -  - Continue to progress workloads to maintain intensity without signs/symptoms of physical distress. Continue to progress workloads to maintain intensity without signs/symptoms of physical distress. Continue to progress workloads to maintain  intensity without signs/symptoms of physical distress.   Average METs  -  - 3.6 4.25 4.7     Resistance Training   Training Prescription Yes Yes  - Yes Yes   Weight 2 lbs 5 lb  - 5 lb 5 lb   Reps 10-15 10-15  - 10-15 10-15     Interval Training   Interval Training  - No  - No Yes   Equipment  -  -  -  - REL-XR   Comments  -  -  -  - Started trying intervals today     Treadmill   MPH 2.2 2.6 2.6 2.8 2.8   Grade 0.5 0._2 Minutes _3 METs 2.84 3.17 4.07 4.69 4.69     NuStep   Level _4 SPM 80 80 73 67 78   Minutes _5 METs 2 2.9 3.1 3.8 4.5     REL-XR   Level 1  -  -  - 6   Speed 50  -  -  - 51   Minutes 15  -  -  - 15   METs 2  -  -  - 4.9     Home Exercise Plan   Plans to continue exercise at  - Home (comment)  walk to start  -  - Home (comment)  walk to start   Frequency  - Add 1 additional day to program exercise sessions.  -  - Add 1 additional day to program exercise sessions.   Initial Home Exercises Provided  - 01/11/17  -  - 01/11/17   Row Name 03/10/17 1400             Response to Exercise   Blood Pressure (Admit) 130/72       Blood Pressure (Exit) 122/68       Heart Rate (Admit) 107 bpm  Heart Rate (Exercise) 140 bpm       Heart Rate (Exit) 104 bpm       Oxygen Saturation (Admit) 97 %       Oxygen Saturation (Exercise) 92 %       Oxygen Saturation (Exit) 95 %       Rating of Perceived Exertion (Exercise) 15       Perceived Dyspnea (Exercise) 3       Symptoms none       Duration Continue with 45 min of aerobic exercise without signs/symptoms of physical distress.       Intensity THRR unchanged         Progression   Progression Continue to progress workloads to maintain intensity without signs/symptoms of physical distress.       Average METs 5.1         Interval Training   Interval Training Yes       Equipment NuStep         Treadmill   MPH 3       Grade 4       Minutes 15       METs 4.95          NuStep   Level 6       SPM 93       Minutes 15       METs 5.3          Exercise Comments:      Exercise Comments    Row Name 01/04/17 1117 02/24/17 1359         Exercise Comments First full day of exercise!  Patient was oriented to gym and equipment including functions, settings, policies, and procedures.  Patient's individual exercise prescription and treatment plan were reviewed.  All starting workloads were established based on the results of the 6 minute walk test done at initial orientation visit.  The plan for exercise progression was also introduced and progression will be customized based on patient's performance and goals Shabre is concerned about weight loss a,tough she has lost 4 lb since beginning.  Staff explained muscle v. fat weight and importance of strength and cardio training.         Exercise Goals and Review:      Exercise Goals    Row Name 12/28/16 1627             Exercise Goals   Increase Physical Activity Yes       Intervention Provide advice, education, support and counseling about physical activity/exercise needs.;Develop an individualized exercise prescription for aerobic and resistive training based on initial evaluation findings, risk stratification, comorbidities and participant's personal goals.       Expected Outcomes Achievement of increased cardiorespiratory fitness and enhanced flexibility, muscular endurance and strength shown through measurements of functional capacity and personal statement of participant.       Increase Strength and Stamina Yes       Intervention Provide advice, education, support and counseling about physical activity/exercise needs.;Develop an individualized exercise prescription for aerobic and resistive training based on initial evaluation findings, risk stratification, comorbidities and participant's personal goals.       Expected Outcomes Achievement of increased cardiorespiratory fitness and enhanced  flexibility, muscular endurance and strength shown through measurements of functional capacity and personal statement of participant.          Exercise Goals Re-Evaluation :     Exercise Goals Re-Evaluation    Row Name 01/11/17 1055 01/13/17 1530 01/28/17 1510  02/10/17 1443 02/24/17 1357     Exercise Goal Re-Evaluation   Exercise Goals Review Increase Physical Activity;Increase Strenth and Stamina Increase Physical Activity;Increase Strenth and Stamina Increase Physical Activity;Increase Strenth and Stamina Increase Physical Activity;Increase Strenth and Stamina Increase Physical Activity;Increase Strength and Stamina;Able to understand and use Dyspnea scale;Able to understand and use rate of perceived exertion (RPE) scale;Knowledge and understanding of Target Heart Rate Range (THRR);Understanding of Exercise Prescription   Comments Home exercise guidelines were reviewed with patient.  It was recommended for patinet to increase days of physical activity at home when she does not come to class.  Pari is adding walking at home to her current exercise.  Shayona is  progressing well and has increased her overall MET level. Zarah has progressed well with exercise and has increased her overall MET level. Kynesha tried intervals on the XR today and will continue.  Staff reviewed procedures for interval training.     Expected Outcomes Patient will continue to come to class regularly for pulmonary rehab and add 1-2 days of exercise at home outside of class.  Short - Maheen will exercise regularly in LW and at home.  Long - Manha will finish the LW program. Short - Jeanine will continue to attend regularly and continue to increase overall fitness.  Long - Princesa will maintain her fitness after finishing LW. Short - Micheline will contineu to progress in LW.  Long - Tascha will reach optimal fitness level and maintain it after completing LW. Short - Sarayah will continue intervals.  Long -  Alisson will see overall improvement in MET level and stamina.   [place] Name 03/10/17 1434             Exercise Goal Re-Evaluation   Exercise Goals Review Increase Physical Activity;Increase Strength and Stamina;Knowledge and understanding of Target Heart Rate Range (THRR);Able to understand and use rate of perceived exertion (RPE) scale       Comments Sahar has tolerated interval training well.  She has increased MET level.        Expected Outcomes Short - Meranda will continue with intervals.  Long - Alliyah will incorporate intervals into exercise outside of class.          Discharge Exercise Prescription (Final Exercise Prescription Changes):     Exercise Prescription Changes - 03/10/17 1400      Response to Exercise   Blood Pressure (Admit) 130/72   Blood Pressure (Exit) 122/68   Heart Rate (Admit) 107 bpm   Heart Rate (Exercise) 140 bpm   Heart Rate (Exit) 104 bpm   Oxygen Saturation (Admit) 97 %   Oxygen Saturation (Exercise) 92 %   Oxygen Saturation (Exit) 95 %   Rating of Perceived Exertion (Exercise) 15   Perceived Dyspnea (Exercise) 3   Symptoms none   Duration Continue with 45 min of aerobic exercise without signs/symptoms of physical distress.   Intensity THRR unchanged     Progression   Progression Continue to progress workloads to maintain intensity without signs/symptoms of physical distress.   Average METs 5.1     Interval Training   Interval Training Yes   Equipment NuStep     Treadmill   MPH 3   Grade 4   Minutes 15   METs 4.95     NuStep   Level 6   SPM 93   Minutes 15   METs 5.3      Nutrition:  Target Goals: Understanding of nutrition guidelines, daily intake of sodium <1531m, cholesterol <  224m, calories 30% from fat and 7% or less from saturated fats, daily to have 5 or more servings of fruits and vegetables.  Biometrics:     Pre Biometrics - 12/28/16 1627      Pre Biometrics   Height 5' 0.7" (1.542 m)   Weight 148 lb 6.4  oz (67.3 kg)   Waist Circumference 37 inches   Hip Circumference 39 inches   Waist to Hip Ratio 0.95 %   BMI (Calculated) 28.4       Nutrition Therapy Plan and Nutrition Goals:     Nutrition Therapy & Goals - 01/29/17 1312      Nutrition Therapy   Diet DASH     Personal Nutrition Goals   Personal Goal #2 Continue with current healthy eating pattern and regular exercise   Comments Ms. PBordneris following a healthy eating pattern, incorporating lean protein sources, plenty of vegetables and fruits, and whole grain foods. She is limiting calories to about 1000kcal daily for weight loss. I advised her to continue with multivitamin supplements as well as calcium and B12 to ensure adequate nutrient intake. Discussed importance of adequate protein in low calorie diets.      Intervention Plan   Intervention Prescribe, educate and counsel regarding individualized specific dietary modifications aiming towards targeted core components such as weight, hypertension, lipid management, diabetes, heart failure and other comorbidities.;Nutrition handout(s) given to patient.   Expected Outcomes Short Term Goal: Understand basic principles of dietary content, such as calories, fat, sodium, cholesterol and nutrients.;Short Term Goal: A plan has been developed with personal nutrition goals set during dietitian appointment.;Long Term Goal: Adherence to prescribed nutrition plan.      Nutrition Discharge: Rate Your Plate Scores:   Nutrition Goals Re-Evaluation:     Nutrition Goals Re-Evaluation    Row Name 01/11/17 1446 02/03/17 1115 03/10/17 1444         Goals   Current Weight  - 144 lb (65.3 kg) 142 lb (64.4 kg)     Nutrition Goal Make an appointment with the dietician  -  -     CDunlapstill would like to lose 20lbs  more Valeri is losing weight slowly but surely.     Expected Outcome Patient would like to lose 20lbs  Short: Lose 5 Lbs the next few weeks. Long: Lose 20 lbs by the  end of the LKeyCorpwill continue with weight loss until she reaches her goal.        Nutrition Goals Discharge (Final Nutrition Goals Re-Evaluation):     Nutrition Goals Re-Evaluation - 03/10/17 1444      Goals   Current Weight 142 lb (64.4 kg)   Comment Albie is losing weight slowly but surely.   Expected Outcome Latondra will continue with weight loss until she reaches her goal.      Psychosocial: Target Goals: Acknowledge presence or absence of significant depression and/or stress, maximize coping skills, provide positive support system. Participant is able to verbalize types and ability to use techniques and skills needed for reducing stress and depression.   Initial Review & Psychosocial Screening:     Initial Psych Review & Screening - 12/28/16 1Ucon Yes   Comments Patient is frustrated that she cannot do activities she use to do because of her breathing. Her daughter treats her like she is her mother and she is frustrated when she acts like that to  her. Jannette is looking forward to starting [employer].      Barriers   Psychosocial barriers to participate in program The patient should benefit from training in stress management and relaxation.     Screening Interventions   Interventions Encouraged to exercise;Program counselor consult      Quality of Life Scores:     Quality of Life - 03/22/17 1102      Quality of Life Scores   Health/Function Pre 21.97 %   Health/Function Post 23.75 %   Health/Function % Change 8.1 %   Socioeconomic Pre 23.57 %   Socioeconomic Post 25.07 %   Socioeconomic % Change  6.36 %   Psych/Spiritual Pre 14.5 %   Psych/Spiritual Post 20.57 %   Psych/Spiritual % Change 41.86 %   Family Pre 20.5 %   Family Post 30 %   Family % Change 46.34 %   GLOBAL Pre 20.95 %   GLOBAL Post 24.1 %   GLOBAL % Change 15.04 %      PHQ-9: Recent Review Flowsheet Data    Depression  screen [facility] 2/9 03/22/2017 12/28/2016 10/27/2016 08/24/2016 04/22/2016   Decreased Interest 0 0 0 0 0   Down, Depressed, Hopeless 0 0 0 0 0   PHQ - 2 Score 0 0 0 0 0   Altered sleeping 1 0 - - -   Tired, decreased energy 1 1 - - -   Change in appetite 1 2 - - -   Feeling bad or failure about yourself  0 0 - - -   Trouble concentrating 0 2 - - -   Moving slowly or fidgety/restless 0 2 - - -   Suicidal thoughts 0 0 - - -   PHQ-9 Score 3 7 - - -   Difficult doing work/chores Somewhat difficult Very difficult - - -     Interpretation of Total Score  Total Score Depression Severity:  1-4 = Minimal depression, 5-9 = Mild depression, 10-14 = Moderate depression, 15-19 = Moderately severe depression, 20-27 = Severe depression   Psychosocial Evaluation and Intervention:     Psychosocial Evaluation - 01/04/17 1029      Psychosocial Evaluation & Interventions   Interventions Encouraged to exercise with the program and follow exercise prescription;Stress management education;Relaxation education   Comments Counselor met with Seleta today for initial psychosocial evaluation.  She is a 66 year old who has COPD.  Sophiya has a daughter who lives close by and several other family members locally for support for her.  She reports not sleeping well and has tried Melatonin OTC to help with some success.  She admits to a history of depression and anxiety and is on medication currently for several months that she reports as helpful.  Her mood is generally positive most of the time.  Her stressors are her health and conflict with one of her sisters recently.  Cadience has goals to lose weight and increase her stamina and strength.  She will be followed by staff throughout the course of this program.     Expected Outcomes Lady will benefit from consistent exercise to achieve her stated goals.  It will also possibly help with her current sleep issues.  Homer should meet with the dietician to address her weight  loss goals. She will also benefit from the psychoeducational components of this program to develop positive coping strategies with her current stressors.  Staff will be following.   Continue Psychosocial Services  Follow up required by staff  Psychosocial Re-Evaluation:     Psychosocial Re-Evaluation    Ralston Name 01/25/17 1110 01/27/17 1029 03/03/17 1130         Psychosocial Re-Evaluation   Current issues with History of Depression  -  -     Comments Counselor follow up with Mattisyn today reporting progress made on her goals with weight loss and healthier eating choices.  She is noticing less shortness of breath engaging in her daily activities.  Her mood remains positive and she is continuing to sleep well.  Counselor commended Kaija on her progress made and commitment to positive choices concerning her health.   Gregory reports her sleep has improved to at least 6 hours per night since coming into this program.  She is feeling better overall and reports this program has been so helpful for her.  She continues to have stress in her life but is approaching it with a positive attitude.  Zuleyma reports her mood has improved along with her sleep.  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 Counselor follow up with Kaylea today reporting progress noted in that she was able to get into an outfit that she hasn't fit into for over a year - so she is losing weight and inches since coming into this program.  She continues to sleep well and is enjoying the education that is specific to her pulmonary health.  Nichoel reports she is experiencing a little stress with her 73 year old dog that is dying but she is practicing positive self care and coping strategies to get through this.   Virgia reports plans to go to the [place] this coming weekend for a dance - which is a first for her in a long time.  Counselor commended IT trainer for all her hard work and  progress made.       Expected Outcomes  -  - Jalani will continue to exercise consistently and practice positive self care to cope with the stress in her life currently.      Interventions  -  - Stress management education        Psychosocial Discharge (Final Psychosocial Re-Evaluation):     Psychosocial Re-Evaluation - 03/03/17 1130      Psychosocial Re-Evaluation   Comments Counselor follow up with Samyah today reporting progress noted in that she was able to get into an outfit that she hasn't fit into for over a year - so she is losing weight and inches since coming into this program.  She continues to sleep well and is enjoying the education that is specific to her pulmonary health.  Evah reports she is experiencing a little stress with her 70 year old dog that is dying but she is practicing positive self care and coping strategies to get through this.   Royce reports plans to go to the [place] this coming weekend for a dance - which is a first for her in a long time.  Counselor commended IT trainer for all her hard work and progress made.     Expected Outcomes Nayely will continue to exercise consistently and practice positive self care to cope with the stress in her life currently.    Interventions Stress management education      Education: Education Goals: Education classes will be provided on a weekly basis, covering required topics. Participant will state understanding/return demonstration of topics presented.  Learning Barriers/Preferences:     Learning Barriers/Preferences - 12/28/16 1622  Learning Barriers/Preferences   Learning Barriers None      Education Topics: Initial Evaluation Education: - Verbal, written and demonstration of respiratory meds, RPE/PD scales, oximetry and breathing techniques. Instruction on use of nebulizers and MDIs: cleaning and proper use, rinsing mouth with steroid doses and importance of monitoring MDI activations.    Pulmonary Rehab from 03/19/2017 in [facility] Cardiac and Pulmonary Rehab  Date  12/28/16  Educator  [facility]  Instruction Review Code (retired)  2- meets goals/outcomes      General Nutrition Guidelines/Fats and Fiber: -Group instruction provided by verbal, written material, models and posters to present the general guidelines for heart healthy nutrition. Gives an explanation and review of dietary fats and fiber.   Pulmonary Rehab from 03/19/2017 in [facility] Cardiac and Pulmonary Rehab  Date  02/15/17  Educator  CR  Instruction Review Code  1- Verbalizes Understanding      Controlling Sodium/Reading Food Labels: -Group verbal and written material supporting the discussion of sodium use in heart healthy nutrition. Review and explanation with models, verbal and written materials for utilization of the food label.   Pulmonary Rehab from 03/19/2017 in [facility] Cardiac and Pulmonary Rehab  Date  02/22/17  Educator  CR  Instruction Review Code  1- Verbalizes Understanding      Exercise Physiology & Risk Factors: - Group verbal and written instruction with models to review the exercise physiology of the cardiovascular system and associated critical values. Details cardiovascular disease risk factors and the goals associated with each risk factor.   Pulmonary Rehab from 03/19/2017 in [facility] Cardiac and Pulmonary Rehab  Date  01/06/17  Educator  AS  Instruction Review Code  1- Verbalizes Understanding      Aerobic Exercise & Resistance Training: - Gives group verbal and written discussion on the health impact of inactivity. On the components of aerobic and resistive training programs and the benefits of this training and how to safely progress through these programs.   Pulmonary Rehab from 03/19/2017 in [facility] Cardiac and Pulmonary Rehab  Date  01/22/17  Educator  [facility]  Instruction Review Code  1- Verbalizes Understanding      Flexibility, Balance, General Exercise Guidelines: - Provides group verbal and  written instruction on the benefits of flexibility and balance training programs. Provides general exercise guidelines with specific guidelines to those with heart or lung disease. Demonstration and skill practice provided.   Pulmonary Rehab from 03/19/2017 in [facility] Cardiac and Pulmonary Rehab  Date  02/10/17  Educator  AS  Instruction Review Code  1- Verbalizes Understanding      Stress Management: - Provides group verbal and written instruction about the health risks of elevated stress, cause of high stress, and healthy ways to reduce stress.   Pulmonary Rehab from 03/19/2017 in [facility] Cardiac and Pulmonary Rehab  Date  03/17/17  Educator  [facility]  Instruction Review Code  1- Verbalizes Understanding      Depression: - Provides group verbal and written instruction on the correlation between heart/lung disease and depressed mood, treatment options, and the stigmas associated with seeking treatment.   Pulmonary Rehab from 03/19/2017 in [facility] Cardiac and Pulmonary Rehab  Date  02/17/17  Educator  [facility]  Instruction Review Code (retired)  2- meets goals/outcomes      Exercise & Equipment Safety: - Individual verbal instruction and demonstration of equipment use and safety with use of the equipment.   Pulmonary Rehab from 03/19/2017 in [facility] Cardiac and Pulmonary Rehab  Date  01/04/17  Educator  [place]  Instruction Review Code  1- Verbalizes Understanding      Infection Prevention: - Provides verbal and written material to individual with discussion of infection control including proper hand washing and proper equipment cleaning during exercise session.   Pulmonary Rehab from 03/19/2017 in [facility] Cardiac and Pulmonary Rehab  Date  01/04/17  Educator  [facility]  Instruction Review Code  1- Verbalizes Understanding      Falls Prevention: - Provides verbal and written material to individual with discussion of falls prevention and safety.   Pulmonary Rehab from 03/19/2017 in [facility] Cardiac and Pulmonary Rehab   Date  12/28/16  Educator  [facility]  Instruction Review Code (retired)  2- meets goals/outcomes      Diabetes: - Individual verbal and written instruction to review signs/symptoms of diabetes, desired ranges of glucose level fasting, after meals and with exercise. Advice that pre and post exercise glucose checks will be done for 3 sessions at entry of program.   Chronic Lung Diseases: - Group verbal and written instruction to review new updates, new respiratory medications, new advancements in procedures and treatments. Provide informative websites and "800" numbers of self-education.   Pulmonary Rehab from 03/19/2017 in [facility] Cardiac and Pulmonary Rehab  Date  01/13/17  Educator  [facility]      Lung Procedures: - Group verbal and written instruction to describe testing methods done to diagnose lung disease. Review the outcome of test results. Describe the treatment choices: Pulmonary Function Tests, ABGs and oximetry.   Energy Conservation: - Provide group verbal and written instruction for methods to conserve energy, plan and organize activities. Instruct on pacing techniques, use of adaptive equipment and posture/positioning to relieve shortness of breath.   Pulmonary Rehab from 03/19/2017 in [facility] Cardiac and Pulmonary Rehab  Date  03/10/17  Educator  [facility]  Instruction Review Code  1- Verbalizes Understanding      Triggers: - Group verbal and written instruction to review types of environmental controls: home humidity, furnaces, filters, dust mite/pet prevention, HEPA vacuums. To discuss weather changes, air quality and the benefits of nasal washing.   Pulmonary Rehab from 03/19/2017 in [facility] Cardiac and Pulmonary Rehab  Date  01/27/17  Educator  [facility]  Instruction Review Code  1- Verbalizes Understanding      Exacerbations: - Group verbal and written instruction to provide: warning signs, infection symptoms, calling MD promptly, preventive modes, and value of vaccinations. Review: effective  airway clearance, coughing and/or vibration techniques. Create an Sports administrator.   Pulmonary Rehab from 03/19/2017 in [facility] Cardiac and Pulmonary Rehab  Date  01/27/17  Educator  [facility]  Instruction Review Code  1- Verbalizes Understanding      Oxygen: - Individual and group verbal and written instruction on oxygen therapy. Includes supplement oxygen, available portable oxygen systems, continuous and intermittent flow rates, oxygen safety, concentrators, and Medicare reimbursement for oxygen.   Respiratory Medications: - Group verbal and written instruction to review medications for lung disease. Drug class, frequency, complications, importance of spacers, rinsing mouth after steroid MDI's, and proper cleaning methods for nebulizers.   Pulmonary Rehab from 03/19/2017 in [facility] Cardiac and Pulmonary Rehab  Date  12/28/16  Educator  [facility]  Instruction Review Code (retired)  2- meets goals/outcomes      AED/CPR: - Group verbal and written instruction with the use of models to demonstrate the basic use of the AED with the basic ABC's of resuscitation.   Pulmonary Rehab from 03/19/2017 in [facility] Cardiac and Pulmonary Rehab  Date  03/19/17  Educator  CE  Instruction Review Code  1- Verbalizes Understanding      Breathing Retraining: - Provides individuals verbal and written instruction on purpose, frequency, and proper technique of diaphragmatic breathing and pursed-lipped breathing. Applies individual practice skills.   Pulmonary Rehab from 03/19/2017 in [facility] Cardiac and Pulmonary Rehab  Date  01/04/17  Educator  [facility]      Anatomy and Physiology of the Lungs: - Group verbal and written instruction with the use of models to provide basic lung anatomy and physiology related to function, structure and complications of lung disease.   Pulmonary Rehab from 03/19/2017 in [facility] Cardiac and Pulmonary Rehab  Date  03/03/17  Educator  [facility]  Instruction Review Code  1- Verbalizes Understanding      Anatomy &  Physiology of the Heart: - Group verbal and written instruction and models provide basic cardiac anatomy and physiology, with the coronary electrical and arterial systems. Review of: AMI, Angina, Valve disease, Heart Failure, Cardiac Arrhythmia, Pacemakers, and the ICD.   Pulmonary Rehab from 03/19/2017 in [facility] Cardiac and Pulmonary Rehab  Date  02/05/17  Educator  [facility]  Instruction Review Code  1- Verbalizes Understanding      Heart Failure: - Group verbal and written instruction on the basics of heart failure: signs/symptoms, treatments, explanation of ejection fraction, enlarged heart and cardiomyopathy.   Pulmonary Rehab from 03/19/2017 in [facility] Cardiac and Pulmonary Rehab  Date  02/05/17  Educator  [facility]  Instruction Review Code  1- Verbalizes Understanding      Sleep Apnea: - Individual verbal and written instruction to review Obstructive Sleep Apnea. Review of risk factors, methods for diagnosing and types of masks and machines for OSA.   Anxiety: - Provides group, verbal and written instruction on the correlation between heart/lung disease and anxiety, treatment options, and management of anxiety.   Relaxation: - Provides group, verbal and written instruction about the benefits of relaxation for patients with heart/lung disease. Also provides patients with examples of relaxation techniques.   Pulmonary Rehab from 03/19/2017 in [facility] Cardiac and Pulmonary Rehab  Date  01/20/17  Educator  [facility]  Instruction Review Code  1- Verbalizes Understanding      Cardiac Medications: - Group verbal and written instruction to review commonly prescribed medications for heart disease. Reviews the medication, class of the drug, and side effects.   Pulmonary Rehab from 03/19/2017 in [facility] Cardiac and Pulmonary Rehab  Date  03/05/17  Educator  Loletha Grayer ENterkinRN  Instruction Review Code  1- Verbalizes Understanding      Know Your Numbers: -Group verbal and written instruction about important numbers in  your health.  Review of Cholesterol, Blood Pressure, Diabetes, and BMI and the role they play in your overall health.   Other: -Provides group and verbal instruction on various topics (see comments)    Knowledge Questionnaire Score:     Knowledge Questionnaire Score - 03/22/17 1102      Knowledge Questionnaire Score   Pre Score 10/10  reviewed with patient       Core Components/Risk Factors/Patient Goals at Admission:     Personal Goals and Risk Factors at Admission - 12/28/16 1617      Core Components/Risk Factors/Patient Goals on Admission    Weight Management Yes;Weight Loss   Intervention Weight Management/Obesity: Establish reasonable short term and long term weight goals.;Weight Management: Develop a combined nutrition and exercise program designed to reach desired caloric intake, while maintaining appropriate intake of nutrient and fiber, sodium and  fats, and appropriate energy expenditure required for the weight goal.;Weight Management: Provide education and appropriate resources to help participant work on and attain dietary goals.   Admit Weight 148 lb 6.4 oz (67.3 kg)   Goal Weight: Short Term 143 lb (64.9 kg)   Goal Weight: Long Term 125 lb (56.7 kg)   Expected Outcomes Short Term: Continue to assess and modify interventions until short term weight is achieved;Long Term: Adherence to nutrition and physical activity/exercise program aimed toward attainment of established weight goal;Weight Loss: Understanding of general recommendations for a balanced deficit meal plan, which promotes 1-2 lb weight loss per week and includes a negative energy balance of [phone] kcal/d;Understanding recommendations for meals to include 15-35% energy as protein, 25-35% energy from fat, 35-60% energy from carbohydrates, less than 243m of dietary cholesterol, 20-35 gm of total fiber daily;Understanding of distribution of calorie intake throughout the day with the consumption of 4-5 meals/snacks    Improve shortness of breath with ADL's Yes   Intervention Provide education, individualized exercise plan and daily activity instruction to help decrease symptoms of SOB with activities of daily living.   Expected Outcomes Short Term: Achieves a reduction of symptoms when performing activities of daily living.   Develop more efficient breathing techniques such as purse lipped breathing and diaphragmatic breathing; and practicing self-pacing with activity Yes   Intervention Provide education, demonstration and support about specific breathing techniuqes utilized for more efficient breathing. Include techniques such as pursed lipped breathing, diaphragmatic breathing and self-pacing activity.   Expected Outcomes Short Term: Participant will be able to demonstrate and use breathing techniques as needed throughout daily activities.   Increase knowledge of respiratory medications and ability to use respiratory devices properly  Yes  Spiriva   Intervention Provide education and demonstration as needed of appropriate use of medications, inhalers, and oxygen therapy.   Expected Outcomes Short Term: Achieves understanding of medications use. Understands that oxygen is a medication prescribed by physician. Demonstrates appropriate use of inhaler and oxygen therapy.   Hypertension Yes   Intervention Provide education on lifestyle modifcations including regular physical activity/exercise, weight management, moderate sodium restriction and increased consumption of fresh fruit, vegetables, and low fat dairy, alcohol moderation, and smoking cessation.;Monitor prescription use compliance.   Expected Outcomes Short Term: Continued assessment and intervention until BP is < 140/972mHG in hypertensive participants. < 130/8036mG in hypertensive participants with diabetes, heart failure or chronic kidney disease.;Long Term: Maintenance of blood pressure at goal levels.      Core Components/Risk Factors/Patient Goals  Review:      Goals and Risk Factor Review    Row Name 01/04/17 1108 01/11/17 1427 02/04/17 1017 03/10/17 1439       Core Components/Risk Factors/Patient Goals Review   Personal Goals Review Develop more efficient breathing techniques such as purse lipped breathing and diaphragmatic breathing and practicing self-pacing with activity.;Weight Management/Obesity Weight Management/Obesity Weight Management/Obesity;Improve shortness of breath with ADL's;Hypertension Weight Management/Obesity;Improve shortness of breath with ADL's    Review Discussed and demonstrated pursed lip breathing with patient. She demonstrated understanding of how and when to use this breathing technique. Patient does want to meet with the dietician to discuss weight loss goals.  Discussed patients plan for weight loss. Explained to patient that she can make an appointment to see the Nutritionist for guidance on diet. Kanaya has been doing very well in [name] and has lost some weight. She has seen the nutritionist and is making changes to her diet. She has been checking  her blood pressure at home regularly.  Keenan reports slow but steady weight loss.  Her clothes fit better.  She reports being able to take her garbage and recycling to the street without shortness of breath.    Expected Outcomes Short: Patinet will use PLB to control SOB during exercise and ADL's. Patient wants to lose 5 lbs.      Long: patient's goal weight is 125 lbs.  Short: Patient wants to lose weight. Patient will set up an appointment to see the Nutritionist. She feels that if she loses weight she can feel better and have more energy. Long: Patient wants to be 120-125lbs. Patient will work on eating better for [employer]. Short: to lose more weight, Monitor Blood pressure. Long: Reach her weight loss goal of being 120-125lbs. Maintain blood pressure at a stable level Short - Yobana will continue to progress with exercise.  Long - Mattilynn will  maintain her weight loss and exercise upon finishing the program.       Core Components/Risk Factors/Patient Goals at Discharge (Final Review):      Goals and Risk Factor Review - 03/10/17 1439      Core Components/Risk Factors/Patient Goals Review   Personal Goals Review Weight Management/Obesity;Improve shortness of breath with ADL's   Review Cherelle reports slow but steady weight loss.  Her clothes fit better.  She reports being able to take her garbage and recycling to the street without shortness of breath.   Expected Outcomes Short - Skyeler will continue to progress with exercise.  Long - Barbera will maintain her weight loss and exercise upon finishing the program.      ITP Comments:     ITP Comments    Row Name 01/15/17 1024 01/25/17 0858 02/22/17 0824 03/22/17 0825     ITP Comments Patient attened Education "Know Your Numbers" 30 day review completed ITP sent to Dr. Ramonita Lab for Dr. Emily Filbert Director of [place]. Continue with ITP unless changes are made by physician.  30 day review completed. ITP sent to Dr. Emily Filbert Director of [place]. Continue with ITP unless changes are made by physician.   30 day review completed. ITP sent to Dr. Emily Filbert Director of [place]. Continue with ITP unless changes are made by physician.         Comments: 30 day review

## 2017-03-24 DIAGNOSIS — Z79899 Other long term (current) drug therapy: Secondary | ICD-10-CM | POA: Diagnosis not present

## 2017-03-24 DIAGNOSIS — J449 Chronic obstructive pulmonary disease, unspecified: Secondary | ICD-10-CM | POA: Diagnosis not present

## 2017-03-24 DIAGNOSIS — K5090 Crohn's disease, unspecified, without complications: Secondary | ICD-10-CM | POA: Diagnosis not present

## 2017-03-24 DIAGNOSIS — M8580 Other specified disorders of bone density and structure, unspecified site: Secondary | ICD-10-CM | POA: Diagnosis not present

## 2017-03-24 DIAGNOSIS — Z87442 Personal history of urinary calculi: Secondary | ICD-10-CM | POA: Diagnosis not present

## 2017-03-24 DIAGNOSIS — I10 Essential (primary) hypertension: Secondary | ICD-10-CM | POA: Diagnosis not present

## 2017-03-24 DIAGNOSIS — Z87891 Personal history of nicotine dependence: Secondary | ICD-10-CM | POA: Diagnosis not present

## 2017-03-24 DIAGNOSIS — M1990 Unspecified osteoarthritis, unspecified site: Secondary | ICD-10-CM | POA: Diagnosis not present

## 2017-03-24 DIAGNOSIS — E039 Hypothyroidism, unspecified: Secondary | ICD-10-CM | POA: Diagnosis not present

## 2017-03-24 NOTE — Progress Notes (Signed)
Daily Session Note  Patient Details  Name: Joanna Rodriguez MRN: [id number] Date of Birth: 10/29/1950 Referring Provider:     Pulmonary Rehab from 12/28/2016 in [facility] Cardiac and Pulmonary Rehab  Referring Provider  Merton Border MD      Encounter Date: 03/24/2017  Check In:     Session Check In - 03/24/17 1008      Check-In   Location [facility]-Cardiac & Pulmonary Rehab   Staff Present Alberteen Sam, MA, ACSM RCEP, Exercise Physiologist;Amanda Oletta Darter, BA, ACSM CEP, Exercise Physiologist;Imanni Burdine Flavia Shipper   Supervising physician immediately available to respond to emergencies [name] immediately available ER MD   Physician(s) Dr. Burlene Arnt and Joni Fears   Medication changes reported     No   Fall or balance concerns reported    No   Warm-up and Cool-down Performed as group-led instruction   Resistance Training Performed Yes   VAD Patient? No     Pain Assessment   Currently in Pain? No/denies   Multiple Pain Sites No         History  Smoking Status  . Former Smoker  . Packs/day: 1.50  . Years: 53.00  . Types: Cigarettes  . Quit date: 08/22/2014  Smokeless Tobacco  . Never Used    Comment: NOV 16,2015    Goals Met:  Independence with exercise equipment Exercise tolerated well No report of cardiac concerns or symptoms Strength training completed today  Goals Unmet:  Not Applicable  Comments: Pt able to follow exercise prescription today without complaint.  Will continue to monitor for progression.   Dr. Emily Filbert is Medical Director for [place] and [name].

## 2017-03-26 DIAGNOSIS — Z87891 Personal history of nicotine dependence: Secondary | ICD-10-CM | POA: Diagnosis not present

## 2017-03-26 DIAGNOSIS — J449 Chronic obstructive pulmonary disease, unspecified: Secondary | ICD-10-CM

## 2017-03-26 DIAGNOSIS — M1990 Unspecified osteoarthritis, unspecified site: Secondary | ICD-10-CM | POA: Diagnosis not present

## 2017-03-26 DIAGNOSIS — K5090 Crohn's disease, unspecified, without complications: Secondary | ICD-10-CM | POA: Diagnosis not present

## 2017-03-26 DIAGNOSIS — I10 Essential (primary) hypertension: Secondary | ICD-10-CM | POA: Diagnosis not present

## 2017-03-26 DIAGNOSIS — M8580 Other specified disorders of bone density and structure, unspecified site: Secondary | ICD-10-CM | POA: Diagnosis not present

## 2017-03-26 DIAGNOSIS — Z79899 Other long term (current) drug therapy: Secondary | ICD-10-CM | POA: Diagnosis not present

## 2017-03-26 DIAGNOSIS — Z87442 Personal history of urinary calculi: Secondary | ICD-10-CM | POA: Diagnosis not present

## 2017-03-26 DIAGNOSIS — E039 Hypothyroidism, unspecified: Secondary | ICD-10-CM | POA: Diagnosis not present

## 2017-03-26 NOTE — Progress Notes (Signed)
Daily Session Note  Patient Details  Name: Joanna Rodriguez MRN: [id number] Date of Birth: 1950-08-01 Referring Provider:     Pulmonary Rehab from 12/28/2016 in [facility] Card[facility] Pulmonary Rehab  Referring Provider  Merton Border MD      Encounter Date: 03/26/2017  Check In:     Session Check In - 03/26/17 1012      Check-In   Location [facility]-Cardiac & Pulmonary Rehab   Staff Present Nada Maclachlan, BA, ACSM CEP, Exercise Physiologist;Krista Frederico Hamman, RN BSN;Joseph Flavia Shipper   Supervising physician immediately available to respond to emergencies [name] immediately available ER MD   Physician(s) Dr. Mable Paris and Reita Cliche   Medication changes reported     No   Fall or balance concerns reported    No   Warm-up and Cool-down Performed as group-led instruction   Resistance Training Performed Yes   VAD Patient? No     Pain Assessment   Currently in Pain? No/denies   Multiple Pain Sites No         History  Smoking Status  . Former Smoker  . Packs/day: 1.50  . Years: 53.00  . Types: Cigarettes  . Quit date: 08/22/2014  Smokeless Tobacco  . Never Used    Comment: NOV 16,2015    Goals Met:  Independence with exercise equipment Exercise tolerated well No report of cardiac concerns or symptoms Strength training completed today  Goals Unmet:  Not Applicable  Comments: Pt able to follow exercise prescription today without complaint.  Will continue to monitor for progression.   Dr. Emily Filbert is Medical Director for [place] and [name].

## 2017-03-29 ENCOUNTER — Encounter: Payer: Medicare HMO | Attending: Pulmonary Disease

## 2017-03-29 VITALS — Ht 60.7 in | Wt 142.2 lb

## 2017-03-29 DIAGNOSIS — Z87442 Personal history of urinary calculi: Secondary | ICD-10-CM | POA: Diagnosis not present

## 2017-03-29 DIAGNOSIS — Z79899 Other long term (current) drug therapy: Secondary | ICD-10-CM | POA: Insufficient documentation

## 2017-03-29 DIAGNOSIS — E039 Hypothyroidism, unspecified: Secondary | ICD-10-CM | POA: Diagnosis not present

## 2017-03-29 DIAGNOSIS — M8580 Other specified disorders of bone density and structure, unspecified site: Secondary | ICD-10-CM | POA: Diagnosis not present

## 2017-03-29 DIAGNOSIS — K5090 Crohn's disease, unspecified, without complications: Secondary | ICD-10-CM | POA: Diagnosis not present

## 2017-03-29 DIAGNOSIS — Z87891 Personal history of nicotine dependence: Secondary | ICD-10-CM | POA: Insufficient documentation

## 2017-03-29 DIAGNOSIS — J449 Chronic obstructive pulmonary disease, unspecified: Secondary | ICD-10-CM | POA: Insufficient documentation

## 2017-03-29 DIAGNOSIS — M1990 Unspecified osteoarthritis, unspecified site: Secondary | ICD-10-CM | POA: Diagnosis not present

## 2017-03-29 DIAGNOSIS — M419 Scoliosis, unspecified: Secondary | ICD-10-CM | POA: Diagnosis not present

## 2017-03-29 DIAGNOSIS — I10 Essential (primary) hypertension: Secondary | ICD-10-CM | POA: Insufficient documentation

## 2017-03-29 NOTE — Progress Notes (Signed)
Daily Session Note  Patient Details  Name: Joanna Rodriguez MRN: [id number] Date of Birth: [birth year] Referring Provider:     Pulmonary Rehab from 12/28/2016 in [facility] Cardiac and Pulmonary Rehab  Referring Provider  Merton Border MD      Encounter Date: 03/29/2017  Check In:     Session Check In - 03/29/17 1024      Check-In   Location [facility]-Cardiac & Pulmonary Rehab   Staff Present Nada Maclachlan, BA, ACSM CEP, Exercise Physiologist;Kelly Amedeo Plenty, BS, ACSM CEP, Exercise Physiologist;Joseph Flavia Shipper   Supervising physician immediately available to respond to emergencies [name] immediately available ER MD   Physician(s) Dr. Mariea Clonts and Alfred Levins   Medication changes reported     No   Fall or balance concerns reported    No   Warm-up and Cool-down Performed as group-led instruction   Resistance Training Performed Yes   VAD Patient? No     Pain Assessment   Currently in Pain? No/denies   Multiple Pain Sites No         History  Smoking Status  . Former Smoker  . Packs/day: 1.50  . Years: 53.00  . Types: Cigarettes  . Quit date: 08/22/2014  Smokeless Tobacco  . Never Used    Comment: NOV 16,2015    Goals Met:  Independence with exercise equipment Exercise tolerated well No report of cardiac concerns or symptoms Strength training completed today  Goals Unmet:  Not Applicable  Comments: Pt able to follow exercise prescription today without complaint.  Will continue to monitor for progression.   Dr. Emily Filbert is Medical Director for [place] and [name].

## 2017-03-31 DIAGNOSIS — Z87442 Personal history of urinary calculi: Secondary | ICD-10-CM | POA: Diagnosis not present

## 2017-03-31 DIAGNOSIS — M8580 Other specified disorders of bone density and structure, unspecified site: Secondary | ICD-10-CM | POA: Diagnosis not present

## 2017-03-31 DIAGNOSIS — M1990 Unspecified osteoarthritis, unspecified site: Secondary | ICD-10-CM | POA: Diagnosis not present

## 2017-03-31 DIAGNOSIS — J449 Chronic obstructive pulmonary disease, unspecified: Secondary | ICD-10-CM

## 2017-03-31 DIAGNOSIS — Z79899 Other long term (current) drug therapy: Secondary | ICD-10-CM | POA: Diagnosis not present

## 2017-03-31 DIAGNOSIS — Z87891 Personal history of nicotine dependence: Secondary | ICD-10-CM | POA: Diagnosis not present

## 2017-03-31 DIAGNOSIS — K5090 Crohn's disease, unspecified, without complications: Secondary | ICD-10-CM | POA: Diagnosis not present

## 2017-03-31 DIAGNOSIS — E039 Hypothyroidism, unspecified: Secondary | ICD-10-CM | POA: Diagnosis not present

## 2017-03-31 DIAGNOSIS — I10 Essential (primary) hypertension: Secondary | ICD-10-CM | POA: Diagnosis not present

## 2017-03-31 NOTE — Patient Instructions (Signed)
Discharge Instructions  Patient Details  Name: Joanna Rodriguez MRN: [id number] Date of Birth: 02/10/1951 Referring Provider:  Wilhelmina Mcardle, MD   Number of Visits: 36/36  Reason for Discharge:  Patient reached a stable level of exercise. Patient independent in their exercise. Patient has met program and personal goals.  Smoking History:  History  Smoking Status  . Former Smoker  . Packs/day: 1.50  . Years: 53.00  . Types: Cigarettes  . Quit date: 08/22/2014  Smokeless Tobacco  . Never Used    Comment: NOV 16,2015    Diagnosis:  Chronic obstructive pulmonary disease, unspecified COPD type ([place])  Initial Exercise Prescription:     Initial Exercise Prescription - 12/28/16 1600      Date of Initial Exercise RX and Referring Provider   Date 12/28/16   Referring Provider Merton Border MD     Treadmill   MPH 2.2   Grade 0.5   Minutes 15   METs 2.84     NuStep   Level 2   SPM 80   Minutes 15   METs 2     REL-XR   Level 1   Speed 50   Minutes 15   METs 2     Prescription Details   Frequency (times per week) 3   Duration Progress to 45 minutes of aerobic exercise without signs/symptoms of physical distress     Intensity   THRR 40-80% of Max Heartrate 111-138   Ratings of Perceived Exertion 11-13   Perceived Dyspnea 0-4     Progression   Progression Continue to progress workloads to maintain intensity without signs/symptoms of physical distress.     Resistance Training   Training Prescription Yes   Weight 2 lbs   Reps 10-15      Discharge Exercise Prescription (Final Exercise Prescription Changes):     Exercise Prescription Changes - 03/24/17 1500      Response to Exercise   Blood Pressure (Admit) 116/60   Blood Pressure (Exit) 108/60   Heart Rate (Admit) 95 bpm   Heart Rate (Exercise) 132 bpm   Heart Rate (Exit) 98 bpm   Oxygen Saturation (Admit) 95 %   Oxygen Saturation (Exercise) 94 %   Oxygen Saturation (Exit) 98 %   Rating  of Perceived Exertion (Exercise) 15   Perceived Dyspnea (Exercise) 3   Symptoms none   Duration Continue with 45 min of aerobic exercise without signs/symptoms of physical distress.   Intensity THRR unchanged     Progression   Progression Continue to progress workloads to maintain intensity without signs/symptoms of physical distress.   Average METs 4.38     Resistance Training   Training Prescription Yes   Weight 6 lb   Reps 10-15     Interval Training   Interval Training Yes   Equipment NuStep     Treadmill   MPH 3   Grade 3   Minutes 15   METs 4.54     NuStep   Level 7   SPM 66   Minutes 15   METs 4.1     REL-XR   Level 7   Speed 53   Minutes 15   METs 4.5      Functional Capacity:     6 Minute Walk    Row Name 12/28/16 1622 03/26/17 1202       6 Minute Walk   Phase Initial Discharge    Distance 1300 feet 1460 feet    Distance % Change  -  12 %    Distance Feet Change  - 160 ft    Walk Time 6 minutes 6 minutes    # of Rest Breaks 0 0    MPH 2.46 2.76    METS 3.13 3.82    RPE 11 7    Perceived Dyspnea  1 0    VO2 Peak 10.94 13.37    Symptoms Yes (comment) Yes (comment)    Comments slightly SOB slight back pain    Resting HR 83 bpm 92 bpm    Resting BP 126/64 130/72    Resting Oxygen Saturation   - 96 %    Exercise Oxygen Saturation  during 6 min walk  - 93 %    Max Ex. HR 116 bpm 128 bpm    Max Ex. BP 146/60 170/80    2 Minute Post BP 144/56  additional reading 126/64  -      Interval HR   Baseline HR (retired) 83  -    1 Minute HR 106  -    2 Minute HR 122  -    3 Minute HR 113  -    4 Minute HR 113  -    5 Minute HR 115  -    6 Minute HR 116  -    2 Minute Post HR 94  -    Interval Heart Rate? Yes  -      Interval Oxygen   Interval Oxygen? Yes  -    Baseline Oxygen Saturation % 96 %  -    Resting Liters of Oxygen 0 L  Room Air  -    1 Minute Oxygen Saturation % 94 %  -    1 Minute Liters of Oxygen 0 L  -    2 Minute Oxygen  Saturation % 95 %  -    2 Minute Liters of Oxygen 0 L  -    3 Minute Oxygen Saturation % 92 %  -    3 Minute Liters of Oxygen 0 L  -    4 Minute Oxygen Saturation % 93 %  -    4 Minute Liters of Oxygen 0 L  -    5 Minute Oxygen Saturation % 94 %  -    5 Minute Liters of Oxygen 0 L  -    6 Minute Oxygen Saturation % 94 %  -    6 Minute Liters of Oxygen 0 L  -    2 Minute Post Oxygen Saturation % 98 %  -    2 Minute Post Liters of Oxygen 0 L  -       Quality of Life:     Quality of Life - 03/22/17 1102      Quality of Life Scores   Health/Function Pre 21.97 %   Health/Function Post 23.75 %   Health/Function % Change 8.1 %   Socioeconomic Pre 23.57 %   Socioeconomic Post 25.07 %   Socioeconomic % Change  6.36 %   Psych/Spiritual Pre 14.5 %   Psych/Spiritual Post 20.57 %   Psych/Spiritual % Change 41.86 %   Family Pre 20.5 %   Family Post 30 %   Family % Change 46.34 %   GLOBAL Pre 20.95 %   GLOBAL Post 24.1 %   GLOBAL % Change 15.04 %      Personal Goals: Goals established at orientation with interventions provided to work toward goal.     Personal Goals  and Risk Factors at Admission - 12/28/16 1617      Core Components/Risk Factors/Patient Goals on Admission    Weight Management Yes;Weight Loss   Intervention Weight Management/Obesity: Establish reasonable short term and long term weight goals.;Weight Management: Develop a combined nutrition and exercise program designed to reach desired caloric intake, while maintaining appropriate intake of nutrient and fiber, sodium and fats, and appropriate energy expenditure required for the weight goal.;Weight Management: Provide education and appropriate resources to help participant work on and attain dietary goals.   Admit Weight 148 lb 6.4 oz (67.3 kg)   Goal Weight: Short Term 143 lb (64.9 kg)   Goal Weight: Long Term 125 lb (56.7 kg)   Expected Outcomes Short Term: Continue to assess and modify interventions until short  term weight is achieved;Long Term: Adherence to nutrition and physical activity/exercise program aimed toward attainment of established weight goal;Weight Loss: Understanding of general recommendations for a balanced deficit meal plan, which promotes 1-2 lb weight loss per week and includes a negative energy balance of [id number] kcal/d;Understanding recommendations for meals to include 15-35% energy as protein, 25-35% energy from fat, 35-60% energy from carbohydrates, less than 265m of dietary cholesterol, 20-35 gm of total fiber daily;Understanding of distribution of calorie intake throughout the day with the consumption of 4-5 meals/snacks   Improve shortness of breath with ADL's Yes   Intervention Provide education, individualized exercise plan and daily activity instruction to help decrease symptoms of SOB with activities of daily living.   Expected Outcomes Short Term: Achieves a reduction of symptoms when performing activities of daily living.   Develop more efficient breathing techniques such as purse lipped breathing and diaphragmatic breathing; and practicing self-pacing with activity Yes   Intervention Provide education, demonstration and support about specific breathing techniuqes utilized for more efficient breathing. Include techniques such as pursed lipped breathing, diaphragmatic breathing and self-pacing activity.   Expected Outcomes Short Term: Participant will be able to demonstrate and use breathing techniques as needed throughout daily activities.   Increase knowledge of respiratory medications and ability to use respiratory devices properly  Yes  Spiriva   Intervention Provide education and demonstration as needed of appropriate use of medications, inhalers, and oxygen therapy.   Expected Outcomes Short Term: Achieves understanding of medications use. Understands that oxygen is a medication prescribed by physician. Demonstrates appropriate use of inhaler and oxygen therapy.    Hypertension Yes   Intervention Provide education on lifestyle modifcations including regular physical activity/exercise, weight management, moderate sodium restriction and increased consumption of fresh fruit, vegetables, and low fat dairy, alcohol moderation, and smoking cessation.;Monitor prescription use compliance.   Expected Outcomes Short Term: Continued assessment and intervention until BP is < 140/953mHG in hypertensive participants. < 130/8049mG in hypertensive participants with diabetes, heart failure or chronic kidney disease.;Long Term: Maintenance of blood pressure at goal levels.       Personal Goals Discharge:     Goals and Risk Factor Review - 03/10/17 1439      Core Components/Risk Factors/Patient Goals Review   Personal Goals Review Weight Management/Obesity;Improve shortness of breath with ADL's   Review Atina reports slow but steady weight loss.  Her clothes fit better.  She reports being able to take her garbage and recycling to the street without shortness of breath.   Expected Outcomes Short - Kristian will continue to progress with exercise.  Long - Avalyn will maintain her weight loss and exercise upon finishing the program.      Exercise  Goals and Review:     Exercise Goals    Row Name 12/28/16 1627             Exercise Goals   Increase Physical Activity Yes       Intervention Provide advice, education, support and counseling about physical activity/exercise needs.;Develop an individualized exercise prescription for aerobic and resistive training based on initial evaluation findings, risk stratification, comorbidities and participant's personal goals.       Expected Outcomes Achievement of increased cardiorespiratory fitness and enhanced flexibility, muscular endurance and strength shown through measurements of functional capacity and personal statement of participant.       Increase Strength and Stamina Yes       Intervention Provide advice,  education, support and counseling about physical activity/exercise needs.;Develop an individualized exercise prescription for aerobic and resistive training based on initial evaluation findings, risk stratification, comorbidities and participant's personal goals.       Expected Outcomes Achievement of increased cardiorespiratory fitness and enhanced flexibility, muscular endurance and strength shown through measurements of functional capacity and personal statement of participant.          Nutrition & Weight - Outcomes:     Pre Biometrics - 12/28/16 1627      Pre Biometrics   Height 5' 0.7" (1.542 m)   Weight 148 lb 6.4 oz (67.3 kg)   Waist Circumference 37 inches   Hip Circumference 39 inches   Waist to Hip Ratio 0.95 %   BMI (Calculated) 28.4         Post Biometrics - 03/29/17 1104       Post  Biometrics   Height 5' 0.7" (1.542 m)   Weight 142 lb 3.2 oz (64.5 kg)   Waist Circumference 36 inches   Hip Circumference 39.5 inches   Waist to Hip Ratio 0.91 %   BMI (Calculated) 27.13      Nutrition:     Nutrition Therapy & Goals - 01/29/17 1312      Nutrition Therapy   Diet DASH     Personal Nutrition Goals   Personal Goal #2 Continue with current healthy eating pattern and regular exercise   Comments Ms. Estey is following a healthy eating pattern, incorporating lean protein sources, plenty of vegetables and fruits, and whole grain foods. She is limiting calories to about 1000kcal daily for weight loss. I advised her to continue with multivitamin supplements as well as calcium and B12 to ensure adequate nutrient intake. Discussed importance of adequate protein in low calorie diets.      Intervention Plan   Intervention Prescribe, educate and counsel regarding individualized specific dietary modifications aiming towards targeted core components such as weight, hypertension, lipid management, diabetes, heart failure and other comorbidities.;Nutrition handout(s) given to  patient.   Expected Outcomes Short Term Goal: Understand basic principles of dietary content, such as calories, fat, sodium, cholesterol and nutrients.;Short Term Goal: A plan has been developed with personal nutrition goals set during dietitian appointment.;Long Term Goal: Adherence to prescribed nutrition plan.      Nutrition Discharge:   Education Questionnaire Score:     Knowledge Questionnaire Score - 03/22/17 1102      Knowledge Questionnaire Score   Pre Score 10/10  reviewed with patient      Goals reviewed with patient; copy given to patient.

## 2017-03-31 NOTE — Progress Notes (Signed)
Discharge Progress Report  Patient Details  Name: Joanna Rodriguez MRN: [id number] Date of Birth: Oct 22, 1950 Referring Provider:     Pulmonary Rehab from 12/28/2016 in [facility]  Referring Provider  Merton Border MD       Number of Visits: 36/36  Reason for Discharge:  Patient reached a stable level of exercise. Patient independent in their exercise. Patient has met program and personal goals.  Smoking History:  History  Smoking Status  . Former Smoker  . Packs/day: 1.50  . Years: 53.00  . Types: Cigarettes  . Quit date: 08/22/2014  Smokeless Tobacco  . Never Used    Comment: NOV 16,2015    Diagnosis:  Chronic obstructive pulmonary disease, unspecified COPD type ([place])  ADL [facility]:     Pulmonary Assessment Scores    Row Name 12/28/16 1636 02/05/17 1029 03/22/17 1101     ADL [facility]   ADL Phase Entry Mid Exit   SOB Score total 39 28 25   Rest 1 0 0   Walk 1 0 0   Stairs 0 3 1   Bath 0 0 0   Dress 0 0 0   Shop 2 0 0     mMRC Score   mMRC Score 2.5  -  -      Initial Exercise Prescription:     Initial Exercise Prescription - 12/28/16 1600      Date of Initial Exercise RX and Referring Provider   Date 12/28/16   Referring Provider Merton Border MD     Treadmill   MPH 2.2   Grade 0.5   Minutes 15   METs 2.84     NuStep   Level 2   SPM 80   Minutes 15   METs 2     REL-XR   Level 1   Speed 50   Minutes 15   METs 2     Prescription Details   Frequency (times per week) 3   Duration Progress to 45 minutes of aerobic exercise without signs/symptoms of physical distress     Intensity   THRR 40-80% of Max Heartrate 111-138   Ratings of Perceived Exertion 11-13   Perceived Dyspnea 0-4     Progression   Progression Continue to progress workloads to maintain intensity without signs/symptoms of physical distress.     Resistance Training   Training Prescription Yes   Weight 2 lbs   Reps 10-15      Discharge  Exercise Prescription (Final Exercise Prescription Changes):     Exercise Prescription Changes - 03/24/17 1500      Response to Exercise   Blood Pressure (Admit) 116/60   Blood Pressure (Exit) 108/60   Heart Rate (Admit) 95 bpm   Heart Rate (Exercise) 132 bpm   Heart Rate (Exit) 98 bpm   Oxygen Saturation (Admit) 95 %   Oxygen Saturation (Exercise) 94 %   Oxygen Saturation (Exit) 98 %   Rating of Perceived Exertion (Exercise) 15   Perceived Dyspnea (Exercise) 3   Symptoms none   Duration Continue with 45 min of aerobic exercise without signs/symptoms of physical distress.   Intensity THRR unchanged     Progression   Progression Continue to progress workloads to maintain intensity without signs/symptoms of physical distress.   Average METs 4.38     Resistance Training   Training Prescription Yes   Weight 6 lb   Reps 10-15     Interval Training   Interval Training Yes  Equipment NuStep     Treadmill   MPH 3   Grade 3   Minutes 15   METs 4.54     NuStep   Level 7   SPM 66   Minutes 15   METs 4.1     REL-XR   Level 7   Speed 53   Minutes 15   METs 4.5      Functional Capacity:     6 Minute Walk    Row Name 12/28/16 1622 03/26/17 1202       6 Minute Walk   Phase Initial Discharge    Distance 1300 feet 1460 feet    Distance % Change  - 12 %    Distance Feet Change  - 160 ft    Walk Time 6 minutes 6 minutes    # of Rest Breaks 0 0    MPH 2.46 2.76    METS 3.13 3.82    RPE 11 7    Perceived Dyspnea  1 0    VO2 Peak 10.94 13.37    Symptoms Yes (comment) Yes (comment)    Comments slightly SOB slight back pain    Resting HR 83 bpm 92 bpm    Resting BP 126/64 130/72    Resting Oxygen Saturation   - 96 %    Exercise Oxygen Saturation  during 6 min walk  - 93 %    Max Ex. HR 116 bpm 128 bpm    Max Ex. BP 146/60 170/80    2 Minute Post BP 144/56  additional reading 126/64  -      Interval HR   Baseline HR (retired) 83  -    1 Minute HR 106  -     2 Minute HR 122  -    3 Minute HR 113  -    4 Minute HR 113  -    5 Minute HR 115  -    6 Minute HR 116  -    2 Minute Post HR 94  -    Interval Heart Rate? Yes  -      Interval Oxygen   Interval Oxygen? Yes  -    Baseline Oxygen Saturation % 96 %  -    Resting Liters of Oxygen 0 L  Room Air  -    1 Minute Oxygen Saturation % 94 %  -    1 Minute Liters of Oxygen 0 L  -    2 Minute Oxygen Saturation % 95 %  -    2 Minute Liters of Oxygen 0 L  -    3 Minute Oxygen Saturation % 92 %  -    3 Minute Liters of Oxygen 0 L  -    4 Minute Oxygen Saturation % 93 %  -    4 Minute Liters of Oxygen 0 L  -    5 Minute Oxygen Saturation % 94 %  -    5 Minute Liters of Oxygen 0 L  -    6 Minute Oxygen Saturation % 94 %  -    6 Minute Liters of Oxygen 0 L  -    2 Minute Post Oxygen Saturation % 98 %  -    2 Minute Post Liters of Oxygen 0 L  -       Psychological, QOL, Others - Outcomes: PHQ 2/9: Depression screen [facility] 2/9 03/22/2017 12/28/2016 10/27/2016 08/24/2016 04/22/2016  Decreased Interest 0 0 0 0 0  Down, Depressed, Hopeless 0 0 0 0 0  PHQ - 2 Score 0 0 0 0 0  Altered sleeping 1 0 - - -  Tired, decreased energy 1 1 - - -  Change in appetite 1 2 - - -  Feeling bad or failure about yourself  0 0 - - -  Trouble concentrating 0 2 - - -  Moving slowly or fidgety/restless 0 2 - - -  Suicidal thoughts 0 0 - - -  PHQ-9 Score 3 7 - - -  Difficult doing work/chores Somewhat difficult Very difficult - - -    Quality of Life:     Quality of Life - 03/22/17 1102      Quality of Life Scores   Health/Function Pre 21.97 %   Health/Function Post 23.75 %   Health/Function % Change 8.1 %   Socioeconomic Pre 23.57 %   Socioeconomic Post 25.07 %   Socioeconomic % Change  6.36 %   Psych/Spiritual Pre 14.5 %   Psych/Spiritual Post 20.57 %   Psych/Spiritual % Change 41.86 %   Family Pre 20.5 %   Family Post 30 %   Family % Change 46.34 %   GLOBAL Pre 20.95 %   GLOBAL Post 24.1 %   GLOBAL %  Change 15.04 %      Personal Goals: Goals established at orientation with interventions provided to work toward goal.     Personal Goals and Risk Factors at Admission - 12/28/16 1617      Core Components/Risk Factors/Patient Goals on Admission    Weight Management Yes;Weight Loss   Intervention Weight Management/Obesity: Establish reasonable short term and long term weight goals.;Weight Management: Develop a combined nutrition and exercise program designed to reach desired caloric intake, while maintaining appropriate intake of nutrient and fiber, sodium and fats, and appropriate energy expenditure required for the weight goal.;Weight Management: Provide education and appropriate resources to help participant work on and attain dietary goals.   Admit Weight 148 lb 6.4 oz (67.3 kg)   Goal Weight: Short Term 143 lb (64.9 kg)   Goal Weight: Long Term 125 lb (56.7 kg)   Expected Outcomes Short Term: Continue to assess and modify interventions until short term weight is achieved;Long Term: Adherence to nutrition and physical activity/exercise program aimed toward attainment of established weight goal;Weight Loss: Understanding of general recommendations for a balanced deficit meal plan, which promotes 1-2 lb weight loss per week and includes a negative energy balance of [phone] kcal/d;Understanding recommendations for meals to include 15-35% energy as protein, 25-35% energy from fat, 35-60% energy from carbohydrates, less than 244m of dietary cholesterol, 20-35 gm of total fiber daily;Understanding of distribution of calorie intake throughout the day with the consumption of 4-5 meals/snacks   Improve shortness of breath with ADL's Yes   Intervention Provide education, individualized exercise plan and daily activity instruction to help decrease symptoms of SOB with activities of daily living.   Expected Outcomes Short Term: Achieves a reduction of symptoms when performing activities of daily living.    Develop more efficient breathing techniques such as purse lipped breathing and diaphragmatic breathing; and practicing self-pacing with activity Yes   Intervention Provide education, demonstration and support about specific breathing techniuqes utilized for more efficient breathing. Include techniques such as pursed lipped breathing, diaphragmatic breathing and self-pacing activity.   Expected Outcomes Short Term: Participant will be able to demonstrate and use breathing techniques as needed throughout daily activities.   Increase knowledge of respiratory medications and  ability to use respiratory devices properly  Yes  Spiriva   Intervention Provide education and demonstration as needed of appropriate use of medications, inhalers, and oxygen therapy.   Expected Outcomes Short Term: Achieves understanding of medications use. Understands that oxygen is a medication prescribed by physician. Demonstrates appropriate use of inhaler and oxygen therapy.   Hypertension Yes   Intervention Provide education on lifestyle modifcations including regular physical activity/exercise, weight management, moderate sodium restriction and increased consumption of fresh fruit, vegetables, and low fat dairy, alcohol moderation, and smoking cessation.;Monitor prescription use compliance.   Expected Outcomes Short Term: Continued assessment and intervention until BP is < 140/52m HG in hypertensive participants. < 130/853mHG in hypertensive participants with diabetes, heart failure or chronic kidney disease.;Long Term: Maintenance of blood pressure at goal levels.       Personal Goals Discharge:     Goals and Risk Factor Review    Row Name 01/04/17 1108 01/11/17 1427 02/04/17 1017 03/10/17 1439       Core Components/Risk Factors/Patient Goals Review   Personal Goals Review Develop more efficient breathing techniques such as purse lipped breathing and diaphragmatic breathing and practicing self-pacing with  activity.;Weight Management/Obesity Weight Management/Obesity Weight Management/Obesity;Improve shortness of breath with ADL's;Hypertension Weight Management/Obesity;Improve shortness of breath with ADL's    Review Discussed and demonstrated pursed lip breathing with patient. She demonstrated understanding of how and when to use this breathing technique. Patient does want to meet with the dietician to discuss weight loss goals.  Discussed patients plan for weight loss. Explained to patient that she can make an appointment to see the Nutritionist for guidance on diet. Joanna Rodriguez has been doing very well in [name] and has lost some weight. She has seen the nutritionist and is making changes to her diet. She has been checking her blood pressure at home regularly.  Joanna Rodriguez reports slow but steady weight loss.  Her clothes fit better.  She reports being able to take her garbage and recycling to the street without shortness of breath.    Expected Outcomes Short: [place] will use PLB to control SOB during exercise and ADL's. Patient wants to lose 5 lbs.      Long: patient's goal weight is 125 lbs.  Short: Patient wants to lose weight. Patient will set up an appointment to see the Nutritionist. She feels that if she loses weight she can feel better and have more energy. Long: Patient wants to be 120-125lbs. Patient will work on eating better for weAetnaShort: to lose more weight, Monitor Blood pressure. Long: Reach her weight loss goal of being 120-125lbs. Maintain blood pressure at a stable level Short - Joanna Rodriguez will continue to progress with exercise.  Long - Joanna Rodriguez will maintain her weight loss and exercise upon finishing the program.       Exercise Goals and Review:     Exercise Goals    Row Name 12/28/16 1627             Exercise Goals   Increase Physical Activity Yes       Intervention Provide advice, education, support and counseling about physical activity/exercise  needs.;Develop an individualized exercise prescription for aerobic and resistive training based on initial evaluation findings, risk stratification, comorbidities and participant's personal goals.       Expected Outcomes Achievement of increased cardiorespiratory fitness and enhanced flexibility, muscular endurance and strength shown through measurements of functional capacity and personal statement of participant.       Increase Strength and Stamina  Yes       Intervention Provide advice, education, support and counseling about physical activity/exercise needs.;Develop an individualized exercise prescription for aerobic and resistive training based on initial evaluation findings, risk stratification, comorbidities and participant's personal goals.       Expected Outcomes Achievement of increased cardiorespiratory fitness and enhanced flexibility, muscular endurance and strength shown through measurements of functional capacity and personal statement of participant.          Nutrition & Weight - Outcomes:     Pre Biometrics - 12/28/16 1627      Pre Biometrics   Height 5' 0.7" (1.542 m)   Weight 148 lb 6.4 oz (67.3 kg)   Waist Circumference 37 inches   Hip Circumference 39 inches   Waist to Hip Ratio 0.95 %   BMI (Calculated) 28.4         Post Biometrics - 03/29/17 1104       Post  Biometrics   Height 5' 0.7" (1.542 m)   Weight 142 lb 3.2 oz (64.5 kg)   Waist Circumference 36 inches   Hip Circumference 39.5 inches   Waist to Hip Ratio 0.91 %   BMI (Calculated) 27.13      Nutrition:     Nutrition Therapy & Goals - 01/29/17 1312      Nutrition Therapy   Diet DASH     Personal Nutrition Goals   Personal Goal #2 Continue with current healthy eating pattern and regular exercise   Comments Joanna Rodriguez is following a healthy eating pattern, incorporating lean protein sources, plenty of vegetables and fruits, and whole grain foods. She is limiting calories to about 1000kcal daily  for weight loss. I advised her to continue with multivitamin supplements as well as calcium and B12 to ensure adequate nutrient intake. Discussed importance of adequate protein in low calorie diets.      Intervention Plan   Intervention Prescribe, educate and counsel regarding individualized specific dietary modifications aiming towards targeted core components such as weight, hypertension, lipid management, diabetes, heart failure and other comorbidities.;Nutrition handout(s) given to patient.   Expected Outcomes Short Term Goal: Understand basic principles of dietary content, such as calories, fat, sodium, cholesterol and nutrients.;Short Term Goal: A plan has been developed with personal nutrition goals set during dietitian appointment.;Long Term Goal: Adherence to prescribed nutrition plan.      Nutrition Discharge:   Education Questionnaire Score:     Knowledge Questionnaire Score - 03/22/17 1102      Knowledge Questionnaire Score   Pre Score 10/10  reviewed with patient      Goals reviewed with patient; copy given to patient.

## 2017-03-31 NOTE — Progress Notes (Signed)
Daily Session Note  Patient Details  Name: Joanna Rodriguez MRN: [id number] Date of Birth: Aug 05, 1950 Referring Provider:     Pulmonary Rehab from 12/28/2016 in [facility]  Referring Provider  Joanna Border MD      Encounter Date: 03/31/2017  Check In:     Session Check In - 03/31/17 1031      Check-In   Location [facility]-Cardiac & Pulmonary Rehab   Staff Present Joanna Sam, MA, ACSM RCEP, Exercise Physiologist;Joanna Rodriguez, BA, ACSM CEP, Exercise Physiologist;Joanna Rodriguez   Supervising physician immediately available to respond to emergencies [name] immediately available ER MD   Physician(s) Dr. Clearnce Rodriguez and Joanna Rodriguez   Medication changes reported     No   Fall or balance concerns reported    No   Warm-up and Cool-down Performed as group-led instruction   Resistance Training Performed Yes   VAD Patient? No     Pain Assessment   Currently in Pain? No/denies   Multiple Pain Sites No         History  Smoking Status  . Former Smoker  . Packs/day: 1.50  . Years: 53.00  . Types: Cigarettes  . Quit date: 08/22/2014  Smokeless Tobacco  . Never Used    Comment: NOV 16,2015    Goals Met:  Independence with exercise equipment Exercise tolerated well No report of cardiac concerns or symptoms Strength training completed today  Goals Unmet:  Not Applicable  Comments:  Joanna Rodriguez graduated today from cardiac rehab with 36 sessions completed.  Details of the patient's exercise prescription and what She needs to do in order to continue the prescription and progress were discussed with patient.  Patient was given a copy of prescription and goals.  Patient verbalized understanding.  Joanna Rodriguez plans to continue to exercise by working out at [employer].   Dr. Emily Rodriguez is Medical Director for [facility] and [name].

## 2017-03-31 NOTE — Progress Notes (Signed)
Pulmonary Individual Treatment Plan  Patient Details  Name: Joanna Rodriguez MRN: [id number] Date of Birth: 1951-05-26 Referring Provider:     Pulmonary Rehab from 12/28/2016 in [facility] Cardiac and Pulmonary Rehab  Referring Provider  Merton Border MD      Initial Encounter Date:    Pulmonary Rehab from 12/28/2016 in [facility] Cardiac and Pulmonary Rehab  Date  12/28/16  Referring Provider  Merton Border MD      Visit Diagnosis: Chronic obstructive pulmonary disease, unspecified COPD type ([place])  Patient's Home Medications on Admission:  Current Outpatient Prescriptions:  .  acyclovir (ZOVIRAX) 400 MG tablet, Take 1 tablet (400 mg total) by mouth daily., Disp: 90 tablet, Rfl: 3 .  Alum & Mag Hydroxide-Simeth (ANTACID ANTI-GAS MAX STRENGTH PO), Take 1 each by mouth daily. Reported on 11/21/2015, Disp: , Rfl:  .  aspirin EC 81 MG tablet, Take 81 mg by mouth daily., Disp: , Rfl:  .  B Complex-Folic Acid (B COMPLEX-VITAMIN B12 PO), Take 1 tablet by mouth daily., Disp: , Rfl:  .  BLACK COHOSH PO, Take by mouth daily., Disp: , Rfl:  .  buPROPion (WELLBUTRIN SR) 150 MG 12 hr tablet, 1 tab daily for 1 week, then 1 tab BID, Disp: 180 tablet, Rfl: 3 .  Calcium Carb-Cholecalciferol 600-800 MG-UNIT TABS, Take by mouth 2 (two) times daily., Disp: , Rfl:  .  Co-Enzyme Q-10 30 MG CAPS, Take 1 capsule by mouth daily., Disp: , Rfl:  .  Glucosamine-Chondroit-Vit C-Mn (SM GLUCOSAMINE/CHONDROITIN PO), Take 1,500 mg by mouth 2 (two) times daily., Disp: , Rfl:  .  levothyroxine (SYNTHROID, LEVOTHROID) 100 MCG tablet, Take 1 tablet (100 mcg total) by mouth daily., Disp: 90 tablet, Rfl: 3 .  lisinopril (PRINIVIL,ZESTRIL) 5 MG tablet, Take 1 tablet (5 mg total) by mouth daily., Disp: 90 tablet, Rfl: 1 .  Melatonin 5 MG TABS, Take 5 mg by mouth at bedtime as needed., Disp: , Rfl:  .  mercaptopurine (PURINETHOL) 50 MG tablet, TAKE 1 AND 1/2 TABLETS EVERY DAY, Disp: 135 tablet, Rfl: 3 .  Multiple Vitamins-Minerals  (MULTIVITAMIN WITH MINERALS) tablet, Take 1 tablet by mouth daily., Disp: , Rfl:  .  Omega-3 Fatty Acids (FISH OIL PO), Take 1,200 mg by mouth 2 (two) times daily., Disp: , Rfl:  .  pantoprazole (PROTONIX) 40 MG tablet, TAKE 1 TABLET EVERY DAY, Disp: 90 tablet, Rfl: 3 .  Tiotropium Bromide Monohydrate (SPIRIVA RESPIMAT) 2.5 MCG/ACT AERS, Inhale 2 puffs into the lungs daily. Gargle and rinse after each use, Disp: 3 Inhaler, Rfl: 3 .  traMADol (ULTRAM) 50 MG tablet, TAKE 1 TABLET THREE TIMES DAILY, Disp: 270 tablet, Rfl: 1  Past Medical History: Past Medical History:  Diagnosis Date  . Arthritis   . Cancer [facility])    face  . COPD (chronic obstructive pulmonary disease) ([place])   . Crohn's disease ([place])   . Dyspnea on exertion   . Genital herpes   . Hypertension   . Hypothyroid   . Kidney stones   . Osteopenia   . Personal history of tobacco use, presenting hazards to health 10/23/2015  . Scoliosis   . Spontaneous pneumothorax   . Vertigo    last episode 11/14/15  . Volvulus ([place])     Tobacco Use: History  Smoking Status  . Former Smoker  . Packs/day: 1.50  . Years: 53.00  . Types: Cigarettes  . Quit date: 08/22/2014  Smokeless Tobacco  . Never Used    Comment: NOV 16,2015  Labs: Recent Review Flowsheet Data    Labs for ITP Cardiac and Pulmonary Rehab Latest Ref Rng & Units 10/28/2015 10/27/2016   Cholestrol 100 - 199 mg/dL 141 166   LDLCALC 0 - 99 mg/dL 49 90   HDL >39 mg/dL 48 43   Trlycerides 0 - 149 mg/dL 221(H) 166(H)       Pulmonary Assessment Scores:     Pulmonary Assessment Scores    Row Name 12/28/16 1636 02/05/17 1029 03/22/17 1101     ADL UCSD   ADL Phase Entry Mid Exit   SOB Score total 39 28 25   Rest 1 0 0   Walk 1 0 0   Stairs 0 3 1   Bath 0 0 0   Dress 0 0 0   Shop 2 0 0     mMRC Score   mMRC Score 2.5  -  -      Pulmonary Function Assessment:     Pulmonary Function Assessment - 12/28/16 1625      Pulmonary Function Tests   RV% 116 %    DLCO% 81 %     Initial Spirometry Results   FVC% 57 %   FEV1% 53 %   FEV1/FVC Ratio 69.9     Post Bronchodilator Spirometry Results   FVC% 76 %   FEV1% 68 %   FEV1/FVC Ratio 67.83      Exercise Target Goals:    Exercise Program Goal: Individual exercise prescription set with THRR, safety & activity barriers. Participant demonstrates ability to understand and report RPE using BORG scale, to self-measure pulse accurately, and to acknowledge the importance of the exercise prescription.  Exercise Prescription Goal: Starting with aerobic activity 30 plus minutes a day, 3 days per week for initial exercise prescription. Provide home exercise prescription and guidelines that participant acknowledges understanding prior to discharge.  Activity Barriers & Risk Stratification:     Activity Barriers & Cardiac Risk Stratification - 12/28/16 1623      Activity Barriers & Cardiac Risk Stratification   Activity Barriers Back Problems;Deconditioning;Muscular Weakness;Shortness of Breath  no longer has to take as much tramadol      6 Minute Walk:     6 Minute Walk    Row Name 12/28/16 1622 03/26/17 1202       6 Minute Walk   Phase Initial Discharge    Distance 1300 feet 1460 feet    Distance % Change  - 12 %    Distance Feet Change  - 160 ft    Walk Time 6 minutes 6 minutes    # of Rest Breaks 0 0    MPH 2.46 2.76    METS 3.13 3.82    RPE 11 7    Perceived Dyspnea  1 0    VO2 Peak 10.94 13.37    Symptoms Yes (comment) Yes (comment)    Comments slightly SOB slight back pain    Resting HR 83 bpm 92 bpm    Resting BP 126/64 130/72    Resting Oxygen Saturation   - 96 %    Exercise Oxygen Saturation  during 6 min walk  - 93 %    Max Ex. HR 116 bpm 128 bpm    Max Ex. BP 146/60 170/80    2 Minute Post BP 144/56  additional reading 126/64  -      Interval HR   Baseline HR (retired) 83  -    1 Minute HR 106  -  2 Minute HR 122  -    3 Minute HR 113  -    4 Minute HR  113  -    5 Minute HR 115  -    6 Minute HR 116  -    2 Minute Post HR 94  -    Interval Heart Rate? Yes  -      Interval Oxygen   Interval Oxygen? Yes  -    Baseline Oxygen Saturation % 96 %  -    Resting Liters of Oxygen 0 L  Room Air  -    1 Minute Oxygen Saturation % 94 %  -    1 Minute Liters of Oxygen 0 L  -    2 Minute Oxygen Saturation % 95 %  -    2 Minute Liters of Oxygen 0 L  -    3 Minute Oxygen Saturation % 92 %  -    3 Minute Liters of Oxygen 0 L  -    4 Minute Oxygen Saturation % 93 %  -    4 Minute Liters of Oxygen 0 L  -    5 Minute Oxygen Saturation % 94 %  -    5 Minute Liters of Oxygen 0 L  -    6 Minute Oxygen Saturation % 94 %  -    6 Minute Liters of Oxygen 0 L  -    2 Minute Post Oxygen Saturation % 98 %  -    2 Minute Post Liters of Oxygen 0 L  -      Oxygen Initial Assessment:     Oxygen Initial Assessment - 12/28/16 1620      Home Oxygen   Home Oxygen Device None     Intervention   Short Term Goals To learn and understand importance of monitoring SPO2 with pulse oximeter and demonstrate accurate use of the pulse oximeter.;To Learn and understand importance of maintaining oxygen saturations>88%;To learn and demonstrate proper purse lipped breathing techniques or other breathing techniques.;To learn and demonstrate proper use of respiratory medications   Long  Term Goals Maintenance of O2 saturations>88%;Compliance with respiratory medication;Demonstrates proper use of MDI's;Verbalizes importance of monitoring SPO2 with pulse oximeter and return demonstration;Exhibits proper breathing techniques, such as purse lipped breathing or other method taught during program session      Oxygen Re-Evaluation:     Oxygen Re-Evaluation    Row Name 01/11/17 1434 02/04/17 1025           Program Oxygen Prescription   Program Oxygen Prescription None None        Home Oxygen   Home Oxygen Device None None      Sleep Oxygen Prescription None None       Home Exercise Oxygen Prescription None None      Home at Rest Exercise Oxygen Prescription None None        Goals/Expected Outcomes   Short Term Goals To learn and understand importance of monitoring SPO2 with pulse oximeter and demonstrate accurate use of the pulse oximeter.;To Learn and understand importance of maintaining oxygen saturations>88%;To learn and demonstrate proper purse lipped breathing techniques or other breathing techniques.;To learn and demonstrate proper use of respiratory medications To learn and understand importance of monitoring SPO2 with pulse oximeter and demonstrate accurate use of the pulse oximeter.;To Learn and understand importance of maintaining oxygen saturations>88%;To learn and demonstrate proper purse lipped breathing techniques or other breathing techniques.;To learn and demonstrate proper use of  respiratory medications      Long  Term Goals Maintenance of O2 saturations>88%;Exhibits proper breathing techniques, such as purse lipped breathing or other method taught during program session;Verbalizes importance of monitoring SPO2 with pulse oximeter and return demonstration;Demonstrates proper use of MDI's;Compliance with respiratory medication Maintenance of O2 saturations>88%;Compliance with respiratory medication;Demonstrates proper use of MDI's;Exhibits proper breathing techniques, such as purse lipped breathing or other method taught during program session;Verbalizes importance of monitoring SPO2 with pulse oximeter and return demonstration      Comments Patient is doing well since starting the class. Her oxygen levels have been above 90 percent on all machines and walking. Patient is willing to work hard and show initiative when she feels like her exercise routine is to easy. Chrystals PLB techniques have been better but not yet mastered. Her oxygen levels have been above 90% every session. She is increasing her levels on the machines she uses to improve her ADL.       Goals/Expected Outcomes Short Term: Practice PLB to be more efficient. Long Term: Be Independent with PLB Short: Increase levels as tolerated on all exercises, practice PLB techniques. Long: Master PLB and increase exercise levels even further.         Oxygen Discharge (Final Oxygen Re-Evaluation):     Oxygen Re-Evaluation - 02/04/17 1025      Program Oxygen Prescription   Program Oxygen Prescription None     Home Oxygen   Home Oxygen Device None   Sleep Oxygen Prescription None   Home Exercise Oxygen Prescription None   Home at Rest Exercise Oxygen Prescription None     Goals/Expected Outcomes   Short Term Goals To learn and understand importance of monitoring SPO2 with pulse oximeter and demonstrate accurate use of the pulse oximeter.;To Learn and understand importance of maintaining oxygen saturations>88%;To learn and demonstrate proper purse lipped breathing techniques or other breathing techniques.;To learn and demonstrate proper use of respiratory medications   Long  Term Goals Maintenance of O2 saturations>88%;Compliance with respiratory medication;Demonstrates proper use of MDI's;Exhibits proper breathing techniques, such as purse lipped breathing or other method taught during program session;Verbalizes importance of monitoring SPO2 with pulse oximeter and return demonstration   Comments Chrystals PLB techniques have been better but not yet mastered. Her oxygen levels have been above 90% every session. She is increasing her levels on the machines she uses to improve her ADL.   Goals/Expected Outcomes Short: Increase levels as tolerated on all exercises, practice PLB techniques. Long: Master PLB and increase exercise levels even further.      Initial Exercise Prescription:     Initial Exercise Prescription - 12/28/16 1600      Date of Initial Exercise RX and Referring Provider   Date 12/28/16   Referring Provider Merton Border MD     Treadmill   MPH 2.2   Grade  0.5   Minutes 15   METs 2.84     NuStep   Level 2   SPM 80   Minutes 15   METs 2     REL-XR   Level 1   Speed 50   Minutes 15   METs 2     Prescription Details   Frequency (times per week) 3   Duration Progress to 45 minutes of aerobic exercise without signs/symptoms of physical distress     Intensity   THRR 40-80% of Max Heartrate 111-138   Ratings of Perceived Exertion 11-13   Perceived Dyspnea 0-4     Progression  Progression Continue to progress workloads to maintain intensity without signs/symptoms of physical distress.     Resistance Training   Training Prescription Yes   Weight 2 lbs   Reps 10-15      Perform Capillary Blood Glucose checks as needed.  Exercise Prescription Changes:     Exercise Prescription Changes    Row Name 01/04/17 1100 01/13/17 1500 01/28/17 1500 02/10/17 1400 02/24/17 1300     Response to Exercise   Blood Pressure (Admit) 128/78 132/72 118/60 112/60 112/62   Blood Pressure (Exercise) 130/82 146/64 132/74  -  -   Blood Pressure (Exit) 118/98 126/64 112/66 118/70 128/72   Heart Rate (Admit) 118 bpm 104 bpm 98 bpm 100 bpm 108 bpm   Heart Rate (Exercise) 122 bpm 127 bpm 131 bpm 133 bpm 122 bpm   Heart Rate (Exit) 93 bpm 101 bpm 104 bpm 106 bpm 110 bpm   Oxygen Saturation (Admit) 97 % 94 % 95 % 98 % 95 %   Oxygen Saturation (Exercise) 94 % 93 % 93 % 95 % 93 %   Oxygen Saturation (Exit) 97 % 96 % 95 % 97 % 96 %   Rating of Perceived Exertion (Exercise) 13 12 13 15 11    Perceived Dyspnea (Exercise) 0 2 3 3 1    Symptoms  - none none none none   Duration  - Continue with 45 min of aerobic exercise without signs/symptoms of physical distress. Continue with 45 min of aerobic exercise without signs/symptoms of physical distress. Continue with 45 min of aerobic exercise without signs/symptoms of physical distress. Continue with 45 min of aerobic exercise without signs/symptoms of physical distress.   Intensity  - THRR unchanged THRR unchanged  THRR unchanged THRR unchanged     Progression   Progression  -  - Continue to progress workloads to maintain intensity without signs/symptoms of physical distress. Continue to progress workloads to maintain intensity without signs/symptoms of physical distress. Continue to progress workloads to maintain intensity without signs/symptoms of physical distress.   Average METs  -  - 3.6 4.25 4.7     Resistance Training   Training Prescription Yes Yes  - Yes Yes   Weight 2 lbs 5 lb  - 5 lb 5 lb   Reps 10-15 10-15  - 10-15 10-15     Interval Training   Interval Training  - No  - No Yes   Equipment  -  -  -  - REL-XR   Comments  -  -  -  - Started trying intervals today     Treadmill   MPH 2.2 2.6 2.6 2.8 2.8   Grade 0.5 0.5 3 4 4    Minutes 15 15 15 15 15    METs 2.84 3.17 4.07 4.69 4.69     NuStep   Level 2 2 6 6 6    SPM 80 80 73 67 78   Minutes 15 15 15 15 15    METs 2 2.9 3.1 3.8 4.5     REL-XR   Level 1  -  -  - 6   Speed 50  -  -  - 51   Minutes 15  -  -  - 15   METs 2  -  -  - 4.9     Home Exercise Plan   Plans to continue exercise at  - Home (comment)  walk to start  -  - Home (comment)  walk to start   Frequency  - Add  1 additional day to program exercise sessions.  -  - Add 1 additional day to program exercise sessions.   Initial Home Exercises Provided  - 01/11/17  -  - 01/11/17   Row Name 03/10/17 1400 03/24/17 1500           Response to Exercise   Blood Pressure (Admit) 130/72 116/60      Blood Pressure (Exit) 122/68 108/60      Heart Rate (Admit) 107 bpm 95 bpm      Heart Rate (Exercise) 140 bpm 132 bpm      Heart Rate (Exit) 104 bpm 98 bpm      Oxygen Saturation (Admit) 97 % 95 %      Oxygen Saturation (Exercise) 92 % 94 %      Oxygen Saturation (Exit) 95 % 98 %      Rating of Perceived Exertion (Exercise) 15 15      Perceived Dyspnea (Exercise) 3 3      Symptoms none none      Duration Continue with 45 min of aerobic exercise without signs/symptoms of  physical distress. Continue with 45 min of aerobic exercise without signs/symptoms of physical distress.      Intensity THRR unchanged THRR unchanged        Progression   Progression Continue to progress workloads to maintain intensity without signs/symptoms of physical distress. Continue to progress workloads to maintain intensity without signs/symptoms of physical distress.      Average METs 5.1 4.38        Resistance Training   Training Prescription  - Yes      Weight  - 6 lb      Reps  - 10-15        Interval Training   Interval Training Yes Yes      Equipment NuStep NuStep        Treadmill   MPH 3 3      Grade 4 3      Minutes 15 15      METs 4.95 4.54        NuStep   Level 6 7      SPM 93 66      Minutes 15 15      METs 5.3 4.1        REL-XR   Level  - 7      Speed  - 53      Minutes  - 15      METs  - 4.5         Exercise Comments:     Exercise Comments    Row Name 01/04/17 1117 02/24/17 1359         Exercise Comments First full day of exercise!  Patient was oriented to gym and equipment including functions, settings, policies, and procedures.  Patient's individual exercise prescription and treatment plan were reviewed.  All starting workloads were established based on the results of the 6 minute walk test done at initial orientation visit.  The plan for exercise progression was also introduced and progression will be customized based on patient's performance and goals Wylodean is concerned about weight loss a,tough she has lost 4 lb since beginning.  Staff explained muscle v. fat weight and importance of strength and cardio training.         Exercise Goals and Review:     Exercise Goals    Row Name 12/28/16 [phone]  Exercise Goals   Increase Physical Activity Yes       Intervention Provide advice, education, support and counseling about physical activity/exercise needs.;Develop an individualized exercise prescription for aerobic and  resistive training based on initial evaluation findings, risk stratification, comorbidities and participant's personal goals.       Expected Outcomes Achievement of increased cardiorespiratory fitness and enhanced flexibility, muscular endurance and strength shown through measurements of functional capacity and personal statement of participant.       Increase Strength and Stamina Yes       Intervention Provide advice, education, support and counseling about physical activity/exercise needs.;Develop an individualized exercise prescription for aerobic and resistive training based on initial evaluation findings, risk stratification, comorbidities and participant's personal goals.       Expected Outcomes Achievement of increased cardiorespiratory fitness and enhanced flexibility, muscular endurance and strength shown through measurements of functional capacity and personal statement of participant.          Exercise Goals Re-Evaluation :     Exercise Goals Re-Evaluation    Row Name 01/11/17 1055 01/13/17 1530 01/28/17 1510 02/10/17 1443 02/24/17 1357     Exercise Goal Re-Evaluation   Exercise Goals Review Increase Physical Activity;Increase Strenth and Stamina Increase Physical Activity;Increase Strenth and Stamina Increase Physical Activity;Increase Strenth and Stamina Increase Physical Activity;Increase Strenth and Stamina Increase Physical Activity;Increase Strength and Stamina;Able to understand and use Dyspnea scale;Able to understand and use rate of perceived exertion (RPE) scale;Knowledge and understanding of Target Heart Rate Range (THRR);Understanding of Exercise Prescription   Comments Home exercise guidelines were reviewed with patient.  It was recommended for patinet to increase days of physical activity at home when she does not come to class.  Leone is adding walking at home to her current exercise.  Porshe is  progressing well and has increased her overall MET level. Marketta has  progressed well with exercise and has increased her overall MET level. Ursala tried intervals on the XR today and will continue.  Staff reviewed procedures for interval training.     Expected Outcomes Patient will continue to come to class regularly for pulmonary rehab and add 1-2 days of exercise at home outside of class.  Short - Naliah will exercise regularly in LW and at home.  Long - Avalene will finish the LW program. Short - Shamarie will continue to attend regularly and continue to increase overall fitness.  Long - Loredana will maintain her fitness after finishing LW. Short - Sundai will contineu to progress in LW.  Long - Haruka will reach optimal fitness level and maintain it after completing LW. Short - Ellenora will continue intervals.  Long - Naara will see overall improvement in MET level and stamina.   [place] Name 03/10/17 1434 03/24/17 1510           Exercise Goal Re-Evaluation   Exercise Goals Review Increase Physical Activity;Increase Strength and Stamina;Knowledge and understanding of Target Heart Rate Range (THRR);Able to understand and use rate of perceived exertion (RPE) scale Increase Physical Activity;Increase Strength and Stamina;Able to understand and use rate of perceived exertion (RPE) scale      Comments Gabrella has tolerated interval training well.  She has increased MET level.  Glennda has tolerated interval training and has improved her overall MET level.  She has increased her weight to 6 lb strength training.      Expected Outcomes Short - Manika will continue with intervals.  Long - Shona will incorporate intervals into exercise outside of class. SHort -  Raahi will finish last sessions in LW.  Long - Sharmeka will maintain exercise on her own.         Discharge Exercise Prescription (Final Exercise Prescription Changes):     Exercise Prescription Changes - 03/24/17 1500      Response to Exercise   Blood Pressure (Admit) 116/60   Blood  Pressure (Exit) 108/60   Heart Rate (Admit) 95 bpm   Heart Rate (Exercise) 132 bpm   Heart Rate (Exit) 98 bpm   Oxygen Saturation (Admit) 95 %   Oxygen Saturation (Exercise) 94 %   Oxygen Saturation (Exit) 98 %   Rating of Perceived Exertion (Exercise) 15   Perceived Dyspnea (Exercise) 3   Symptoms none   Duration Continue with 45 min of aerobic exercise without signs/symptoms of physical distress.   Intensity THRR unchanged     Progression   Progression Continue to progress workloads to maintain intensity without signs/symptoms of physical distress.   Average METs 4.38     Resistance Training   Training Prescription Yes   Weight 6 lb   Reps 10-15     Interval Training   Interval Training Yes   Equipment NuStep     Treadmill   MPH 3   Grade 3   Minutes 15   METs 4.54     NuStep   Level 7   SPM 66   Minutes 15   METs 4.1     REL-XR   Level 7   Speed 53   Minutes 15   METs 4.5      Nutrition:  Target Goals: Understanding of nutrition guidelines, daily intake of sodium <1575m, cholesterol <2053m calories 30% from fat and 7% or less from saturated fats, daily to have 5 or more servings of fruits and vegetables.  Biometrics:     Pre Biometrics - 12/28/16 1627      Pre Biometrics   Height 5' 0.7" (1.542 m)   Weight 148 lb 6.4 oz (67.3 kg)   Waist Circumference 37 inches   Hip Circumference 39 inches   Waist to Hip Ratio 0.95 %   BMI (Calculated) 28.4         Post Biometrics - 03/29/17 1104       Post  Biometrics   Height 5' 0.7" (1.542 m)   Weight 142 lb 3.2 oz (64.5 kg)   Waist Circumference 36 inches   Hip Circumference 39.5 inches   Waist to Hip Ratio 0.91 %   BMI (Calculated) 27.13      Nutrition Therapy Plan and Nutrition Goals:     Nutrition Therapy & Goals - 01/29/17 1312      Nutrition Therapy   Diet DASH     Personal Nutrition Goals   Personal Goal #2 Continue with current healthy eating pattern and regular exercise    Comments Ms. PeHainers following a healthy eating pattern, incorporating lean protein sources, plenty of vegetables and fruits, and whole grain foods. She is limiting calories to about 1000kcal daily for weight loss. I advised her to continue with multivitamin supplements as well as calcium and B12 to ensure adequate nutrient intake. Discussed importance of adequate protein in low calorie diets.      Intervention Plan   Intervention Prescribe, educate and counsel regarding individualized specific dietary modifications aiming towards targeted core components such as weight, hypertension, lipid management, diabetes, heart failure and other comorbidities.;Nutrition handout(s) given to patient.   Expected Outcomes Short Term Goal: Understand basic principles of dietary  content, such as calories, fat, sodium, cholesterol and nutrients.;Short Term Goal: A plan has been developed with personal nutrition goals set during dietitian appointment.;Long Term Goal: Adherence to prescribed nutrition plan.      Nutrition Discharge: Rate Your Plate Scores:   Nutrition Goals Re-Evaluation:     Nutrition Goals Re-Evaluation    Row Name 01/11/17 1446 02/03/17 1115 03/10/17 1444         Goals   Current Weight  - 144 lb (65.3 kg) 142 lb (64.4 kg)     Nutrition Goal Make an appointment with the dietician  -  -     [place] still would like to lose 20lbs  more Latash is losing weight slowly but surely.     Expected Outcome Patient would like to lose 20lbs  Short: Lose 5 Lbs the next few weeks. Long: Lose 20 lbs by the end of the [employer] will continue with weight loss until she reaches her goal.        Nutrition Goals Discharge (Final Nutrition Goals Re-Evaluation):     Nutrition Goals Re-Evaluation - 03/10/17 1444      Goals   Current Weight 142 lb (64.4 kg)   Comment [place] is losing weight slowly but surely.   Expected Outcome Gayle will continue with weight loss  until she reaches her goal.      Psychosocial: Target Goals: Acknowledge presence or absence of significant depression and/or stress, maximize coping skills, provide positive support system. Participant is able to verbalize types and ability to use techniques and skills needed for reducing stress and depression.   Initial Review & Psychosocial Screening:     Initial Psych Review & Screening - 12/28/16 [place]? Yes   Comments Patient is frustrated that she cannot do activities she use to do because of her breathing. Her daughter treats her like she is her mother and she is frustrated when she acts like that to her. Christy is looking forward to starting [facility].      Barriers   Psychosocial barriers to participate in program The patient should benefit from training in stress management and relaxation.     Screening Interventions   Interventions Encouraged to exercise;Program counselor consult      Quality of Life Scores:     Quality of Life - 03/22/17 1102      Quality of Life Scores   Health/Function Pre 21.97 %   Health/Function Post 23.75 %   Health/Function % Change 8.1 %   Socioeconomic Pre 23.57 %   Socioeconomic Post 25.07 %   Socioeconomic % Change  6.36 %   Psych/Spiritual Pre 14.5 %   Psych/Spiritual Post 20.57 %   Psych/Spiritual % Change 41.86 %   Family Pre 20.5 %   Family Post 30 %   Family % Change 46.34 %   GLOBAL Pre 20.95 %   GLOBAL Post 24.1 %   GLOBAL % Change 15.04 %      PHQ-9: Recent Review Flowsheet Data    Depression screen [facility] 2/9 03/22/2017 12/28/2016 10/27/2016 08/24/2016 04/22/2016   Decreased Interest 0 0 0 0 0   Down, Depressed, Hopeless 0 0 0 0 0   PHQ - 2 Score 0 0 0 0 0   Altered sleeping 1 0 - - -   Tired, decreased energy 1 1 - - -   Change in appetite 1 2 - - -  Feeling bad or failure about yourself  0 0 - - -   Trouble concentrating 0 2 - - -   Moving slowly or fidgety/restless 0 2 - -  -   Suicidal thoughts 0 0 - - -   PHQ-9 Score 3 7 - - -   Difficult doing work/chores Somewhat difficult Very difficult - - -     Interpretation of Total Score  Total Score Depression Severity:  1-4 = Minimal depression, 5-9 = Mild depression, 10-14 = Moderate depression, 15-19 = Moderately severe depression, 20-27 = Severe depression   Psychosocial Evaluation and Intervention:     Psychosocial Evaluation - 01/04/17 1029      Psychosocial Evaluation & Interventions   Interventions Encouraged to exercise with the program and follow exercise prescription;Stress management education;Relaxation education   Comments Counselor met with Marlaine today for initial psychosocial evaluation.  She is a 66 year old who has COPD.  Carianne has a daughter who lives close by and several other family members locally for support for her.  She reports not sleeping well and has tried Melatonin OTC to help with some success.  She admits to a history of depression and anxiety and is on medication currently for several months that she reports as helpful.  Her mood is generally positive most of the time.  Her stressors are her health and conflict with one of her sisters recently.  Ishika has goals to lose weight and increase her stamina and strength.  She will be followed by staff throughout the course of this program.     Expected Outcomes [place] will benefit from consistent exercise to achieve her stated goals.  It will also possibly help with her current sleep issues.  Marai should meet with the dietician to address her weight loss goals. She will also benefit from the psychoeducational components of this program to develop positive coping strategies with her current stressors.  Staff will be following.   Continue Psychosocial Services  Follow up required by staff      Psychosocial Re-Evaluation:     Psychosocial Re-Evaluation    [place] Name 01/25/17 1110 01/27/17 1029 03/03/17 1130          Psychosocial Re-Evaluation   Current issues with History of Depression  -  -     Comments Counselor follow up with Noelia today reporting progress made on her goals with weight loss and healthier eating choices.  She is noticing less shortness of breath engaging in her daily activities.  Her mood remains positive and she is continuing to sleep well.  Counselor commended Parrish on her progress made and commitment to positive choices concerning her health.   Toniqua reports her sleep has improved to at least 6 hours per night since coming into this program.  She is feeling better overall and reports this program has been so helpful for her.  She continues to have stress in her life but is approaching it with a positive attitude.  Gimena reports her mood has improved along with her sleep.  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 Counselor follow up with Jayne today reporting progress noted in that she was able to get into an outfit that she hasn't fit into for over a year - so she is losing weight and inches since coming into this program.  She continues to sleep well and is enjoying the education that is specific to her pulmonary health.  Talin reports she is experiencing  a little stress with her 53 year old dog that is dying but she is practicing positive self care and coping strategies to get through this.   Apryl reports plans to go to the [place] this coming weekend for a dance - which is a first for her in a long time.  Counselor commended IT trainer for all her hard work and progress made.       Expected Outcomes  -  - Ashla will continue to exercise consistently and practice positive self care to cope with the stress in her life currently.      Interventions  -  - Stress management education        Psychosocial Discharge (Final Psychosocial Re-Evaluation):     Psychosocial Re-Evaluation - 03/03/17 1130      Psychosocial Re-Evaluation    Comments Counselor follow up with Claribel today reporting progress noted in that she was able to get into an outfit that she hasn't fit into for over a year - so she is losing weight and inches since coming into this program.  She continues to sleep well and is enjoying the education that is specific to her pulmonary health.  Kailani reports she is experiencing a little stress with her 74 year old dog that is dying but she is practicing positive self care and coping strategies to get through this.   Foye reports plans to go to the [place] this coming weekend for a dance - which is a first for her in a long time.  Counselor commended IT trainer for all her hard work and progress made.     Expected Outcomes [place] will continue to exercise consistently and practice positive self care to cope with the stress in her life currently.    Interventions Stress management education      Education: Education Goals: Education classes will be provided on a weekly basis, covering required topics. Participant will state understanding/return demonstration of topics presented.  Learning Barriers/Preferences:     Learning Barriers/Preferences - 12/28/16 1622      Learning Barriers/Preferences   Learning Barriers None      Education Topics: Initial Evaluation Education: - Verbal, written and demonstration of respiratory meds, RPE/PD scales, oximetry and breathing techniques. Instruction on use of nebulizers and MDIs: cleaning and proper use, rinsing mouth with steroid doses and importance of monitoring MDI activations.   Pulmonary Rehab from 03/31/2017 in [facility] Cardiac and Pulmonary Rehab  Date  12/28/16  Educator  [facility]  Instruction Review Code (retired)  2- meets goals/outcomes      General Nutrition Guidelines/Fats and Fiber: -Group instruction provided by verbal, written material, models and posters to present the general guidelines for heart healthy nutrition. Gives an explanation and review  of dietary fats and fiber.   Pulmonary Rehab from 03/31/2017 in [facility] Cardiac and Pulmonary Rehab  Date  02/15/17  Educator  CR  Instruction Review Code  1- Verbalizes Understanding      Controlling Sodium/Reading Food Labels: -Group verbal and written material supporting the discussion of sodium use in heart healthy nutrition. Review and explanation with models, verbal and written materials for utilization of the food label.   Pulmonary Rehab from 03/31/2017 in [facility] Cardiac and Pulmonary Rehab  Date  02/22/17  Educator  CR  Instruction Review Code  1- Verbalizes Understanding      Exercise Physiology & Risk Factors: - Group verbal and written instruction with models to review the exercise physiology of the cardiovascular system and associated critical values.  Details cardiovascular disease risk factors and the goals associated with each risk factor.   Pulmonary Rehab from 03/31/2017 in [facility] Cardiac and Pulmonary Rehab  Date  01/06/17  Educator  AS  Instruction Review Code  1- Verbalizes Understanding      Aerobic Exercise & Resistance Training: - Gives group verbal and written discussion on the health impact of inactivity. On the components of aerobic and resistive training programs and the benefits of this training and how to safely progress through these programs.   Pulmonary Rehab from 03/31/2017 in [facility] Cardiac and Pulmonary Rehab  Date  01/22/17  Educator  [facility]  Instruction Review Code  1- Verbalizes Understanding      Flexibility, Balance, General Exercise Guidelines: - Provides group verbal and written instruction on the benefits of flexibility and balance training programs. Provides general exercise guidelines with specific guidelines to those with heart or lung disease. Demonstration and skill practice provided.   Pulmonary Rehab from 03/31/2017 in [facility] Cardiac and Pulmonary Rehab  Date  02/10/17  Educator  AS  Instruction Review Code  1- Verbalizes Understanding       Stress Management: - Provides group verbal and written instruction about the health risks of elevated stress, cause of high stress, and healthy ways to reduce stress.   Pulmonary Rehab from 03/31/2017 in [facility] Cardiac and Pulmonary Rehab  Date  03/17/17  Educator  [facility]  Instruction Review Code  1- Verbalizes Understanding      Depression: - Provides group verbal and written instruction on the correlation between heart/lung disease and depressed mood, treatment options, and the stigmas associated with seeking treatment.   Pulmonary Rehab from 03/31/2017 in [facility] Cardiac and Pulmonary Rehab  Date  02/17/17  Educator  [facility]  Instruction Review Code (retired)  2- meets goals/outcomes      Exercise & Equipment Safety: - Individual verbal instruction and demonstration of equipment use and safety with use of the equipment.   Pulmonary Rehab from 03/31/2017 in [facility] Cardiac and Pulmonary Rehab  Date  01/04/17  Educator  [facility]  Instruction Review Code  1- Verbalizes Understanding      Infection Prevention: - Provides verbal and written material to individual with discussion of infection control including proper hand washing and proper equipment cleaning during exercise session.   Pulmonary Rehab from 03/31/2017 in [facility] Cardiac and Pulmonary Rehab  Date  01/04/17  Educator  [facility]  Instruction Review Code  1- Verbalizes Understanding      Falls Prevention: - Provides verbal and written material to individual with discussion of falls prevention and safety.   Pulmonary Rehab from 03/31/2017 in [facility] Cardiac and Pulmonary Rehab  Date  12/28/16  Educator  [facility]  Instruction Review Code (retired)  2- meets goals/outcomes      Diabetes: - Individual verbal and written instruction to review signs/symptoms of diabetes, desired ranges of glucose level fasting, after meals and with exercise. Advice that pre and post exercise glucose checks will be done for 3 sessions at entry of program.   Chronic Lung  Diseases: - Group verbal and written instruction to review new updates, new respiratory medications, new advancements in procedures and treatments. Provide informative websites and "800" numbers of self-education.   Pulmonary Rehab from 03/31/2017 in [facility] Cardiac and Pulmonary Rehab  Date  03/31/17  Educator  [facility]  Instruction Review Code  1- Verbalizes Understanding      Lung Procedures: - Group verbal and written instruction to describe testing methods done to diagnose lung disease.  Review the outcome of test results. Describe the treatment choices: Pulmonary Function Tests, ABGs and oximetry.   Energy Conservation: - Provide group verbal and written instruction for methods to conserve energy, plan and organize activities. Instruct on pacing techniques, use of adaptive equipment and posture/positioning to relieve shortness of breath.   Pulmonary Rehab from 03/31/2017 in [facility] Cardiac and Pulmonary Rehab  Date  03/10/17  Educator  [facility]  Instruction Review Code  1- Verbalizes Understanding      Triggers: - Group verbal and written instruction to review types of environmental controls: home humidity, furnaces, filters, dust mite/pet prevention, HEPA vacuums. To discuss weather changes, air quality and the benefits of nasal washing.   Pulmonary Rehab from 03/31/2017 in [facility] Cardiac and Pulmonary Rehab  Date  01/27/17  Educator  [facility]  Instruction Review Code  1- Verbalizes Understanding      Exacerbations: - Group verbal and written instruction to provide: warning signs, infection symptoms, calling MD promptly, preventive modes, and value of vaccinations. Review: effective airway clearance, coughing and/or vibration techniques. Create an Sports administrator.   Pulmonary Rehab from 03/31/2017 in [facility] Cardiac and Pulmonary Rehab  Date  01/27/17  Educator  [facility]  Instruction Review Code  1- Verbalizes Understanding      Oxygen: - Individual and group verbal and written instruction on oxygen therapy.  Includes supplement oxygen, available portable oxygen systems, continuous and intermittent flow rates, oxygen safety, concentrators, and Medicare reimbursement for oxygen.   Respiratory Medications: - Group verbal and written instruction to review medications for lung disease. Drug class, frequency, complications, importance of spacers, rinsing mouth after steroid MDI's, and proper cleaning methods for nebulizers.   Pulmonary Rehab from 03/31/2017 in [facility] Cardiac and Pulmonary Rehab  Date  12/28/16  Educator  [facility]  Instruction Review Code (retired)  2- meets goals/outcomes      AED/CPR: - Group verbal and written instruction with the use of models to demonstrate the basic use of the AED with the basic ABC's of resuscitation.   Pulmonary Rehab from 03/31/2017 in [facility] Cardiac and Pulmonary Rehab  Date  03/19/17  Educator  CE  Instruction Review Code  1- Verbalizes Understanding      Breathing Retraining: - Provides individuals verbal and written instruction on purpose, frequency, and proper technique of diaphragmatic breathing and pursed-lipped breathing. Applies individual practice skills.   Pulmonary Rehab from 03/31/2017 in [facility] Cardiac and Pulmonary Rehab  Date  01/04/17  Educator  [facility]      Anatomy and Physiology of the Lungs: - Group verbal and written instruction with the use of models to provide basic lung anatomy and physiology related to function, structure and complications of lung disease.   Pulmonary Rehab from 03/31/2017 in [facility] Cardiac and Pulmonary Rehab  Date  03/03/17  Educator  [facility]  Instruction Review Code  1- Verbalizes Understanding      Anatomy & Physiology of the Heart: - Group verbal and written instruction and models provide basic cardiac anatomy and physiology, with the coronary electrical and arterial systems. Review of: AMI, Angina, Valve disease, Heart Failure, Cardiac Arrhythmia, Pacemakers, and the ICD.   Pulmonary Rehab from 03/31/2017 in [facility] Cardiac and  Pulmonary Rehab  Date  02/05/17  Educator  [facility]  Instruction Review Code  1- Verbalizes Understanding      Heart Failure: - Group verbal and written instruction on the basics of heart failure: signs/symptoms, treatments, explanation of ejection fraction, enlarged heart and cardiomyopathy.   Pulmonary Rehab from 03/31/2017 in  [place] Cardiac and Pulmonary Rehab  Date  02/05/17  Educator  [facility]  Instruction Review Code  1- Verbalizes Understanding      Sleep Apnea: - Individual verbal and written instruction to review Obstructive Sleep Apnea. Review of risk factors, methods for diagnosing and types of masks and machines for OSA.   Anxiety: - Provides group, verbal and written instruction on the correlation between heart/lung disease and anxiety, treatment options, and management of anxiety.   Relaxation: - Provides group, verbal and written instruction about the benefits of relaxation for patients with heart/lung disease. Also provides patients with examples of relaxation techniques.   Pulmonary Rehab from 03/31/2017 in [facility] Cardiac and Pulmonary Rehab  Date  01/20/17  Educator  [facility]  Instruction Review Code  1- Verbalizes Understanding      Cardiac Medications: - Group verbal and written instruction to review commonly prescribed medications for heart disease. Reviews the medication, class of the drug, and side effects.   Pulmonary Rehab from 03/31/2017 in [facility] Cardiac and Pulmonary Rehab  Date  03/05/17  Educator  Loletha Grayer ENterkinRN  Instruction Review Code  1- Verbalizes Understanding      Know Your Numbers: -Group verbal and written instruction about important numbers in your health.  Review of Cholesterol, Blood Pressure, Diabetes, and BMI and the role they play in your overall health.   Other: -Provides group and verbal instruction on various topics (see comments)    Knowledge Questionnaire Score:     Knowledge Questionnaire Score - 03/22/17 1102      Knowledge Questionnaire  Score   Pre Score 10/10  reviewed with patient       Core Components/Risk Factors/Patient Goals at Admission:     Personal Goals and Risk Factors at Admission - 12/28/16 1617      Core Components/Risk Factors/Patient Goals on Admission    Weight Management Yes;Weight Loss   Intervention Weight Management/Obesity: Establish reasonable short term and long term weight goals.;Weight Management: Develop a combined nutrition and exercise program designed to reach desired caloric intake, while maintaining appropriate intake of nutrient and fiber, sodium and fats, and appropriate energy expenditure required for the weight goal.;Weight Management: Provide education and appropriate resources to help participant work on and attain dietary goals.   Admit Weight 148 lb 6.4 oz (67.3 kg)   Goal Weight: Short Term 143 lb (64.9 kg)   Goal Weight: Long Term 125 lb (56.7 kg)   Expected Outcomes Short Term: Continue to assess and modify interventions until short term weight is achieved;Long Term: Adherence to nutrition and physical activity/exercise program aimed toward attainment of established weight goal;Weight Loss: Understanding of general recommendations for a balanced deficit meal plan, which promotes 1-2 lb weight loss per week and includes a negative energy balance of [id number] kcal/d;Understanding recommendations for meals to include 15-35% energy as protein, 25-35% energy from fat, 35-60% energy from carbohydrates, less than 277m of dietary cholesterol, 20-35 gm of total fiber daily;Understanding of distribution of calorie intake throughout the day with the consumption of 4-5 meals/snacks   Improve shortness of breath with ADL's Yes   Intervention Provide education, individualized exercise plan and daily activity instruction to help decrease symptoms of SOB with activities of daily living.   Expected Outcomes Short Term: Achieves a reduction of symptoms when performing activities of daily living.    Develop more efficient breathing techniques such as purse lipped breathing and diaphragmatic breathing; and practicing self-pacing with activity Yes   Intervention Provide education, demonstration and support about specific  breathing techniuqes utilized for more efficient breathing. Include techniques such as pursed lipped breathing, diaphragmatic breathing and self-pacing activity.   Expected Outcomes Short Term: Participant will be able to demonstrate and use breathing techniques as needed throughout daily activities.   Increase knowledge of respiratory medications and ability to use respiratory devices properly  Yes  Spiriva   Intervention Provide education and demonstration as needed of appropriate use of medications, inhalers, and oxygen therapy.   Expected Outcomes Short Term: Achieves understanding of medications use. Understands that oxygen is a medication prescribed by physician. Demonstrates appropriate use of inhaler and oxygen therapy.   Hypertension Yes   Intervention Provide education on lifestyle modifcations including regular physical activity/exercise, weight management, moderate sodium restriction and increased consumption of fresh fruit, vegetables, and low fat dairy, alcohol moderation, and smoking cessation.;Monitor prescription use compliance.   Expected Outcomes Short Term: Continued assessment and intervention until BP is < 140/98m HG in hypertensive participants. < 130/837mHG in hypertensive participants with diabetes, heart failure or chronic kidney disease.;Long Term: Maintenance of blood pressure at goal levels.      Core Components/Risk Factors/Patient Goals Review:      Goals and Risk Factor Review    Row Name 01/04/17 1108 01/11/17 1427 02/04/17 1017 03/10/17 1439       Core Components/Risk Factors/Patient Goals Review   Personal Goals Review Develop more efficient breathing techniques such as purse lipped breathing and diaphragmatic breathing and practicing  self-pacing with activity.;Weight Management/Obesity Weight Management/Obesity Weight Management/Obesity;Improve shortness of breath with ADL's;Hypertension Weight Management/Obesity;Improve shortness of breath with ADL's    Review Discussed and demonstrated pursed lip breathing with patient. She demonstrated understanding of how and when to use this breathing technique. Patient does want to meet with the dietician to discuss weight loss goals.  Discussed patients plan for weight loss. Explained to patient that she can make an appointment to see the Nutritionist for guidance on diet. Maryela has been doing very well in [name] and has lost some weight. She has seen the nutritionist and is making changes to her diet. She has been checking her blood pressure at home regularly.  Precilla reports slow but steady weight loss.  Her clothes fit better.  She reports being able to take her garbage and recycling to the street without shortness of breath.    Expected Outcomes Short: [place] will use PLB to control SOB during exercise and ADL's. Patient wants to lose 5 lbs.      Long: patient's goal weight is 125 lbs.  Short: Patient wants to lose weight. Patient will set up an appointment to see the Nutritionist. She feels that if she loses weight she can feel better and have more energy. Long: Patient wants to be 120-125lbs. Patient will work on eating better for weAetnaShort: to lose more weight, Monitor Blood pressure. Long: Reach her weight loss goal of being 120-125lbs. Maintain blood pressure at a stable level Short - Latoi will continue to progress with exercise.  Long - Hiawatha will maintain her weight loss and exercise upon finishing the program.       Core Components/Risk Factors/Patient Goals at Discharge (Final Review):      Goals and Risk Factor Review - 03/10/17 1439      Core Components/Risk Factors/Patient Goals Review   Personal Goals Review Weight Management/Obesity;Improve  shortness of breath with ADL's   Review Neleh reports slow but steady weight loss.  Her clothes fit better.  She reports being able to take her  garbage and recycling to the street without shortness of breath.   Expected Outcomes Short - Desere will continue to progress with exercise.  Long - Kyrah will maintain her weight loss and exercise upon finishing the program.      ITP Comments:     ITP Comments    Row Name 01/15/17 1024 01/25/17 0858 02/22/17 0824 03/22/17 0825 03/31/17 1104   ITP Comments Patient attened Education "Know Your Numbers" 30 day review completed ITP sent to Dr. Ramonita Lab for Dr. Emily Filbert Director of [place]. Continue with ITP unless changes are made by physician.  30 day review completed. ITP sent to Dr. Emily Filbert Director of [place]. Continue with ITP unless changes are made by physician.   30 day review completed. ITP sent to Dr. Emily Filbert Director of [place]. Continue with ITP unless changes are made by physician.   Discharge ITP sent and signed by Dr. Sabra Heck.  Discharge Summary routed to PCP and Pulmonologist.      Comments: Discharge ITP

## 2017-04-29 ENCOUNTER — Encounter: Payer: Self-pay | Admitting: Family Medicine

## 2017-04-29 ENCOUNTER — Ambulatory Visit (INDEPENDENT_AMBULATORY_CARE_PROVIDER_SITE_OTHER): Payer: Medicare HMO | Admitting: Family Medicine

## 2017-04-29 VITALS — BP 135/83 | HR 83 | Temp 98.3°F | Ht 60.0 in | Wt 140.4 lb

## 2017-04-29 DIAGNOSIS — E039 Hypothyroidism, unspecified: Secondary | ICD-10-CM | POA: Diagnosis not present

## 2017-04-29 DIAGNOSIS — I10 Essential (primary) hypertension: Secondary | ICD-10-CM | POA: Diagnosis not present

## 2017-04-29 DIAGNOSIS — Z23 Encounter for immunization: Secondary | ICD-10-CM | POA: Diagnosis not present

## 2017-04-29 DIAGNOSIS — E538 Deficiency of other specified B group vitamins: Secondary | ICD-10-CM

## 2017-04-29 DIAGNOSIS — J432 Centrilobular emphysema: Secondary | ICD-10-CM | POA: Diagnosis not present

## 2017-04-29 MED ORDER — LISINOPRIL 5 MG PO TABS: 5.0000 mg | ORAL_TABLET | Freq: Every day | ORAL | 1 refills | Status: DC

## 2017-04-29 NOTE — Assessment & Plan Note (Signed)
Under good control. Continue current regimen. Continue to monitor. Call with any concerns. 

## 2017-04-29 NOTE — Progress Notes (Signed)
BP 135/83 (BP Location: Left Arm, Patient Position: Sitting, Cuff Size: Normal)   Pulse 83   Temp 98.3 F (36.8 C)   Ht 5' (1.524 m)   Wt 140 lb 7 oz (63.7 kg)   SpO2 98%   BMI 27.43 kg/m    Subjective:    Patient ID: Joanna Rodriguez, female    DOB: 1950-07-28, 66 y.o.   MRN: [id number]  HPI: Joanna Rodriguez is a 66 y.o. female  Chief Complaint  Patient presents with  . Hypertension   HYPERTENSION Hypertension status: controlled  Satisfied with current treatment? yes Duration of hypertension: chronic BP monitoring frequency:  not checking BP medication side effects:  no Medication compliance: excellent compliance Previous BP meds: lisinopril Aspirin: no Recurrent headaches: no Visual changes: no Palpitations: no Dyspnea: no Chest pain: no Lower extremity edema: no Dizzy/lightheaded: no  COPD COPD status: better Satisfied with current treatment?: yes Oxygen use: no Limitation of activity: no Pneumovax: Up to Date Influenza: Up to Date  ANEMIA Anemia status: controlled Etiology of anemia: B12 def Duration of anemia treatment: chronic Compliance with treatment: excellent compliance Iron supplementation side effects: yes Severity of anemia: moderate Fatigue: no Decreased exercise tolerance: no  Dyspnea on exertion: no Palpitations: no Bleeding: no Pica: no  Relevant past medical, surgical, family and social history reviewed and updated as indicated. Interim medical history since our last visit reviewed. Allergies and medications reviewed and updated.  Review of Systems  Constitutional: Negative.   HENT: Negative.   Respiratory: Negative.   Cardiovascular: Negative.   Psychiatric/Behavioral: Negative.     Per HPI unless specifically indicated above     Objective:    BP 135/83 (BP Location: Left Arm, Patient Position: Sitting, Cuff Size: Normal)   Pulse 83   Temp 98.3 F (36.8 C)   Ht 5' (1.524 m)   Wt 140 lb 7 oz (63.7 kg)    SpO2 98%   BMI 27.43 kg/m   Wt Readings from Last 3 Encounters:  04/29/17 140 lb 7 oz (63.7 kg)  03/29/17 142 lb 3.2 oz (64.5 kg)  12/28/16 148 lb 6.4 oz (67.3 kg)    Physical Exam  Constitutional: She is oriented to person, place, and time. She appears well-developed and well-nourished. No distress.  HENT:  Head: Normocephalic and atraumatic.  Right Ear: Hearing normal.  Left Ear: Hearing normal.  Nose: Nose normal.  Eyes: Conjunctivae and lids are normal. Right eye exhibits no discharge. Left eye exhibits no discharge. No scleral icterus.  Cardiovascular: Normal rate, normal heart sounds and intact distal pulses.  Exam reveals no gallop and no friction rub.   No murmur heard. Pulmonary/Chest: Effort normal and breath sounds normal. No respiratory distress. She has no wheezes. She has no rales. She exhibits no tenderness.  Musculoskeletal: Normal range of motion.  Neurological: She is alert and oriented to person, place, and time.  Skin: Skin is warm, dry and intact. No rash noted. She is not diaphoretic. No erythema. No pallor.  Psychiatric: She has a normal mood and affect. Her speech is normal and behavior is normal. Judgment and thought content normal. Cognition and memory are normal.  Nursing note and vitals reviewed.   Results for orders placed or performed in visit on 10/27/16  Microscopic Examination  Result Value Ref Range   WBC, UA 0-5 0 - 5 /hpf   RBC, UA 0-2 0 - 2 /hpf   Epithelial Cells (non renal) 0-10 0 - 10 /hpf  Crystals Present (A) N/A   Crystal Type Calcium Oxalate N/A   Mucus, UA Present (A) Not Estab.   Bacteria, UA None seen None seen/Few  CBC with Differential/Platelet  Result Value Ref Range   WBC 5.0 3.4 - 10.8 x10E3/uL   RBC 3.90 3.77 - 5.28 x10E6/uL   Hemoglobin 13.0 11.1 - 15.9 g/dL   Hematocrit 40.0 34.0 - 46.6 %   MCV 103 (H) 79 - 97 fL   MCH 33.3 (H) 26.6 - 33.0 pg   MCHC 32.5 31.5 - 35.7 g/dL   RDW 16.1 (H) 12.3 - 15.4 %   Platelets 432  (H) 150 - 379 x10E3/uL   Neutrophils 58 Not Estab. %   Lymphs 34 Not Estab. %   Monocytes 6 Not Estab. %   Eos 1 Not Estab. %   Basos 1 Not Estab. %   Neutrophils Absolute 2.9 1.4 - 7.0 x10E3/uL   Lymphocytes Absolute 1.7 0.7 - 3.1 x10E3/uL   Monocytes Absolute 0.3 0.1 - 0.9 x10E3/uL   EOS (ABSOLUTE) 0.0 0.0 - 0.4 x10E3/uL   Basophils Absolute 0.1 0.0 - 0.2 x10E3/uL   Immature Granulocytes 0 Not Estab. %   Immature Grans (Abs) 0.0 0.0 - 0.1 x10E3/uL  Comprehensive metabolic panel  Result Value Ref Range   Glucose 94 65 - 99 mg/dL   BUN 18 8 - 27 mg/dL   Creatinine, Ser 0.91 0.57 - 1.00 mg/dL   GFR calc non Af Amer 65 >59 mL/min/1.73   GFR calc Af Amer 75 >59 mL/min/1.73   BUN/Creatinine Ratio 20 12 - 28   Sodium 141 134 - 144 mmol/L   Potassium 5.2 3.5 - 5.2 mmol/L   Chloride 100 96 - 106 mmol/L   CO2 28 18 - 29 mmol/L   Calcium 10.0 8.7 - 10.3 mg/dL   Total Protein 6.5 6.0 - 8.5 g/dL   Albumin 4.0 3.6 - 4.8 g/dL   Globulin, Total 2.5 1.5 - 4.5 g/dL   Albumin/Globulin Ratio 1.6 1.2 - 2.2   Bilirubin Total 0.4 0.0 - 1.2 mg/dL   Alkaline Phosphatase 76 39 - 117 IU/L   AST 29 0 - 40 IU/L   ALT 40 (H) 0 - 32 IU/L  Lipid Panel w/o Chol/HDL Ratio  Result Value Ref Range   Cholesterol, Total 166 100 - 199 mg/dL   Triglycerides 166 (H) 0 - 149 mg/dL   HDL 43 >39 mg/dL   VLDL Cholesterol Cal 33 5 - 40 mg/dL   LDL Calculated 90 0 - 99 mg/dL  Microalbumin, Urine Waived  Result Value Ref Range   Microalb, Ur Waived 80 (H) 0 - 19 mg/L   Creatinine, Urine Waived 300 10 - 300 mg/dL   Microalb/Creat Ratio 30-300 (H) <30 mg/g  TSH  Result Value Ref Range   TSH 1.080 0.450 - 4.500 uIU/mL  UA/M w/rflx Culture, Routine  Result Value Ref Range   Specific Gravity, UA 1.025 1.005 - 1.030   pH, UA 5.5 5.0 - 7.5   Color, UA Yellow Yellow   Appearance Ur Cloudy (A) Clear   Leukocytes, UA Trace (A) Negative   Protein, UA Trace (A) Negative/Trace   Glucose, UA Negative Negative    Ketones, UA Trace (A) Negative   RBC, UA Negative Negative   Bilirubin, UA Negative Negative   Urobilinogen, Ur 0.2 0.2 - 1.0 mg/dL   Nitrite, UA Negative Negative   Microscopic Examination See below:   B12 and Folate Panel  Result Value Ref Range  Vitamin B-12 610 232 - 1,245 pg/mL   Folate >20.0 >3.0 ng/mL      Assessment & Plan:   Problem List Items Addressed This Visit      Cardiovascular and Mediastinum   HTN (hypertension) - Primary    Under good control. Continue current regimen. Continue to monitor.Call with any concerns.       Relevant Medications   lisinopril (PRINIVIL,ZESTRIL) 5 MG tablet   Other Relevant Orders   CBC with Differential/Platelet   Basic metabolic panel     Respiratory   COPD (chronic obstructive pulmonary disease) ([place])    Under good control. Continue current regimen. Continue to monitor. Continue to follow with pulmonology. Call with any concerns.       Relevant Orders   CBC with Differential/Platelet   Basic metabolic panel     Endocrine   Thyroid activity decreased    Hair is thinning a bit and skin is very dry- would like to check levels again. Drawn today. Continue current regimen. Continue to monitor.Call with any concerns.       Relevant Orders   TSH     Other   B12 deficiency    Under good control. Continue current regimen. Continue to monitor.Call with any concerns.       Relevant Orders   CBC with Differential/Platelet   B12 and Folate Panel    Other Visit Diagnoses    Immunization due       Flu shot given today   Relevant Orders   Flu vaccine HIGH DOSE PF (Fluzone High Dose) (Completed)       Follow up plan: Return in about 6 months (around 10/27/2017) for physical.

## 2017-04-29 NOTE — Assessment & Plan Note (Signed)
Hair is thinning a bit and skin is very dry- would like to check levels again. Drawn today. Continue current regimen. Continue to monitor.Call with any concerns.

## 2017-04-29 NOTE — Patient Instructions (Addendum)

## 2017-04-29 NOTE — Assessment & Plan Note (Signed)
Under good control. Continue current regimen. Continue to monitor. Continue to follow with pulmonology. Call with any concerns.

## 2017-04-30 ENCOUNTER — Telehealth: Payer: Self-pay | Admitting: Family Medicine

## 2017-04-30 ENCOUNTER — Encounter: Payer: Self-pay | Admitting: Family Medicine

## 2017-04-30 DIAGNOSIS — E039 Hypothyroidism, unspecified: Secondary | ICD-10-CM

## 2017-04-30 LAB — B12 AND FOLATE PANEL: VITAMIN B 12: 1658 pg/mL — AB (ref 232–1245)

## 2017-04-30 LAB — CBC WITH DIFFERENTIAL/PLATELET
BASOS ABS: 0 10*3/uL (ref 0.0–0.2)
Basos: 1 %
EOS (ABSOLUTE): 0 10*3/uL (ref 0.0–0.4)
Eos: 1 %
HEMOGLOBIN: 12.5 g/dL (ref 11.1–15.9)
Hematocrit: 35.7 % (ref 34.0–46.6)
IMMATURE GRANS (ABS): 0 10*3/uL (ref 0.0–0.1)
IMMATURE GRANULOCYTES: 0 %
LYMPHS: 32 %
Lymphocytes Absolute: 1.4 10*3/uL (ref 0.7–3.1)
MCH: 34.5 pg — ABNORMAL HIGH (ref 26.6–33.0)
MCHC: 35 g/dL (ref 31.5–35.7)
MCV: 99 fL — ABNORMAL HIGH (ref 79–97)
MONOCYTES: 9 %
Monocytes Absolute: 0.4 10*3/uL (ref 0.1–0.9)
NEUTROS ABS: 2.5 10*3/uL (ref 1.4–7.0)
NEUTROS PCT: 57 %
PLATELETS: 377 10*3/uL (ref 150–379)
RBC: 3.62 x10E6/uL — AB (ref 3.77–5.28)
RDW: 15.5 % — ABNORMAL HIGH (ref 12.3–15.4)
WBC: 4.4 10*3/uL (ref 3.4–10.8)

## 2017-04-30 LAB — BASIC METABOLIC PANEL
BUN / CREAT RATIO: 21 (ref 12–28)
BUN: 18 mg/dL (ref 8–27)
CALCIUM: 10 mg/dL (ref 8.7–10.3)
CHLORIDE: 104 mmol/L (ref 96–106)
CO2: 26 mmol/L (ref 20–29)
CREATININE: 0.85 mg/dL (ref 0.57–1.00)
GFR calc Af Amer: 81 mL/min/{1.73_m2} (ref >59)
GFR calc non Af Amer: 71 mL/min/{1.73_m2} (ref >59)
GLUCOSE: 85 mg/dL (ref 65–99)
Potassium: 5.3 mmol/L — ABNORMAL HIGH (ref 3.5–5.2)
Sodium: 144 mmol/L (ref 134–144)

## 2017-04-30 LAB — TSH: TSH: 0.341 u[IU]/mL — AB (ref 0.450–4.500)

## 2017-04-30 MED ORDER — LEVOTHYROXINE SODIUM 88 MCG PO TABS
88.0000 ug | ORAL_TABLET | Freq: Every day | ORAL | 2 refills | Status: DC
Start: 2017-04-30 — End: 2017-06-17

## 2017-04-30 NOTE — Telephone Encounter (Signed)
Called to let Crystal know that her labs look good. Looks like we're over treating her thyroid a little bit. Will drop to 105mg and recheck 6 weeks. Order in. She's aware.

## 2017-05-06 ENCOUNTER — Other Ambulatory Visit: Payer: Self-pay | Admitting: Gastroenterology

## 2017-05-06 NOTE — Telephone Encounter (Signed)
*  STAT* If patient is at the pharmacy, call can be transferred to refill team.   1. Which medications need to be refilled? (please list name of each medication and dose if known) Tramadol 50 mg, mercaptopurine 50 mg  2. Which pharmacy/location (including street and city if local pharmacy) is medication to be sent to? [facility] mail order  3. Do they need a 30 day or 90 day supply? 90 days

## 2017-05-10 ENCOUNTER — Other Ambulatory Visit: Payer: Self-pay | Admitting: Gastroenterology

## 2017-05-18 NOTE — Telephone Encounter (Signed)
Pt advised she will need to schedule a follow up appt as its been since 10/2015 that she followed up.

## 2017-05-27 ENCOUNTER — Ambulatory Visit: Payer: Medicare HMO | Admitting: Gastroenterology

## 2017-05-27 ENCOUNTER — Encounter: Payer: Self-pay | Admitting: Gastroenterology

## 2017-05-27 ENCOUNTER — Other Ambulatory Visit: Payer: Self-pay

## 2017-05-27 VITALS — BP 145/77 | HR 95 | Ht 60.0 in | Wt 143.5 lb

## 2017-05-27 DIAGNOSIS — K5000 Crohn's disease of small intestine without complications: Secondary | ICD-10-CM

## 2017-05-27 DIAGNOSIS — K50919 Crohn's disease, unspecified, with unspecified complications: Secondary | ICD-10-CM | POA: Diagnosis not present

## 2017-05-27 MED ORDER — MERCAPTOPURINE 50 MG PO TABS: ORAL_TABLET | ORAL | 0 refills | Status: DC

## 2017-05-27 NOTE — Progress Notes (Signed)
Primary Care Physician: Valerie Roys, DO  Primary Gastroenterologist:  Dr. Lucilla Lame  Chief Complaint  Patient presents with  . Follow up Crohn's disease  . Medication Refill    HPI: Joanna Rodriguez is a 66 y.o. female here for follow-up of her Crohn's disease. The patient has had no further diarrhea and over a year. The patient states that she just needs a refill of her medications. The patient is on 75 mg of mercaptopurine. There is no report of any abdominal pain nausea vomiting fevers or chills.  Current Outpatient Medications  Medication Sig Dispense Refill  . acyclovir (ZOVIRAX) 400 MG tablet Take 1 tablet (400 mg total) by mouth daily. 90 tablet 3  . Alum & Mag Hydroxide-Simeth (ANTACID ANTI-GAS MAX STRENGTH PO) Take 1 each by mouth daily. Reported on 11/21/2015    . aspirin EC 81 MG tablet Take 81 mg by mouth daily.    Marland Kitchen BLACK COHOSH PO Take by mouth daily.    Marland Kitchen buPROPion (WELLBUTRIN SR) 150 MG 12 hr tablet 1 tab daily for 1 week, then 1 tab BID 180 tablet 3  . Calcium Carb-Cholecalciferol 600-800 MG-UNIT TABS Take by mouth 2 (two) times daily.    Marland Kitchen Co-Enzyme Q-10 30 MG CAPS Take 1 capsule by mouth daily.    . Glucosamine-Chondroit-Vit C-Mn (SM GLUCOSAMINE/CHONDROITIN PO) Take 1,500 mg by mouth 2 (two) times daily.    Marland Kitchen levothyroxine (SYNTHROID, LEVOTHROID) 88 MCG tablet Take 1 tablet (88 mcg total) by mouth daily. 30 tablet 2  . lisinopril (PRINIVIL,ZESTRIL) 5 MG tablet Take 1 tablet (5 mg total) by mouth daily. 90 tablet 1  . Melatonin 5 MG TABS Take 5 mg by mouth at bedtime as needed.    . mercaptopurine (PURINETHOL) 50 MG tablet TAKE 1 AND 1/2 TABLETS EVERY DAY 135 tablet 0  . Multiple Vitamins-Minerals (MULTIVITAMIN WITH MINERALS) tablet Take 1 tablet by mouth daily.    . pantoprazole (PROTONIX) 40 MG tablet TAKE 1 TABLET EVERY DAY 90 tablet 3  . traMADol (ULTRAM) 50 MG tablet TAKE 1 TABLET THREE TIMES DAILY 270 tablet 1   No current facility-administered  medications for this visit.     Allergies as of 05/27/2017 - Review Complete 05/27/2017  Allergen Reaction Noted  . Nsaids  09/06/2014    ROS:  General: Negative for anorexia, weight loss, fever, chills, fatigue, weakness. ENT: Negative for hoarseness, difficulty swallowing , nasal congestion. CV: Negative for chest pain, angina, palpitations, dyspnea on exertion, peripheral edema.  Respiratory: Negative for dyspnea at rest, dyspnea on exertion, cough, sputum, wheezing.  GI: See history of present illness. GU:  Negative for dysuria, hematuria, urinary incontinence, urinary frequency, nocturnal urination.  Endo: Negative for unusual weight change.    Physical Examination:   BP (!) 145/77 (BP Location: Left Arm, Patient Position: Sitting, Cuff Size: Normal)   Pulse 95   Ht 5' (1.524 m)   Wt 143 lb 8 oz (65.1 kg)   BMI 28.03 kg/m   General: Well-nourished, well-developed in no acute distress.  Eyes: No icterus. Conjunctivae pink. Mouth: Oropharyngeal mucosa moist and pink , no lesions erythema or exudate. Lungs: Clear to auscultation bilaterally. Non-labored. Heart: Regular rate and rhythm, no murmurs rubs or gallops.  Abdomen: Bowel sounds are normal, nontender, nondistended, no hepatosplenomegaly or masses, no abdominal bruits or hernia , no rebound or guarding.   Extremities: No lower extremity edema. No clubbing or deformities. Neuro: Alert and oriented x 3.  Grossly intact. Skin:  Warm and dry, no jaundice.   Psych: Alert and cooperative, normal mood and affect.  Labs:    Imaging Studies: No results found.  Assessment and Plan:   Joanna Rodriguez is a 66 y.o. y/o female who comes in today with a history of Crohn's disease. The patient states she has had small bowel removed but for a volvulus. The patient has been doing well  on 6-mercaptopurine. The patient will have her labs checked for LFTs, CBC and folate. The patient will also have her prescription refilled. The  patient has been explained the plan and agrees with it.   Lucilla Lame, MD. [facility]   Note: This dictation was prepared with Dragon dictation along with smaller phrase technology. Any transcriptional errors that result from this process are unintentional.

## 2017-05-28 LAB — CBC WITH DIFFERENTIAL/PLATELET
BASOS: 1 %
Basophils Absolute: 0.1 10*3/uL (ref 0.0–0.2)
EOS (ABSOLUTE): 0.1 10*3/uL (ref 0.0–0.4)
EOS: 1 %
HEMATOCRIT: 38.3 % (ref 34.0–46.6)
HEMOGLOBIN: 12.9 g/dL (ref 11.1–15.9)
IMMATURE GRANULOCYTES: 0 %
Immature Grans (Abs): 0 10*3/uL (ref 0.0–0.1)
Lymphocytes Absolute: 1.4 10*3/uL (ref 0.7–3.1)
Lymphs: 28 %
MCH: 33.6 pg — ABNORMAL HIGH (ref 26.6–33.0)
MCHC: 33.7 g/dL (ref 31.5–35.7)
MCV: 100 fL — AB (ref 79–97)
MONOCYTES: 9 %
Monocytes Absolute: 0.5 10*3/uL (ref 0.1–0.9)
NEUTROS PCT: 61 %
Neutrophils Absolute: 3.1 10*3/uL (ref 1.4–7.0)
Platelets: 354 10*3/uL (ref 150–379)
RBC: 3.84 x10E6/uL (ref 3.77–5.28)
RDW: 16.1 % — ABNORMAL HIGH (ref 12.3–15.4)
WBC: 5.1 10*3/uL (ref 3.4–10.8)

## 2017-05-28 LAB — HEPATIC FUNCTION PANEL
ALBUMIN: 3.9 g/dL (ref 3.6–4.8)
ALT: 31 IU/L (ref 0–32)
AST: 29 IU/L (ref 0–40)
Alkaline Phosphatase: 70 IU/L (ref 39–117)
BILIRUBIN, DIRECT: 0.11 mg/dL (ref 0.00–0.40)
Bilirubin Total: 0.3 mg/dL (ref 0.0–1.2)
TOTAL PROTEIN: 6.2 g/dL (ref 6.0–8.5)

## 2017-05-28 LAB — FOLATE: Folate: >20 ng/mL (ref >3.0)

## 2017-06-03 ENCOUNTER — Telehealth: Payer: Self-pay

## 2017-06-03 NOTE — Telephone Encounter (Signed)
-----   Message from Lucilla Lame, MD sent at 05/29/2017 12:51 PM EST ----- Let the patient know that her blood work did not show any worrisome findings and in fact her liver enzymes have come back to normal.

## 2017-06-03 NOTE — Telephone Encounter (Signed)
Left vm with results of labs.

## 2017-06-08 ENCOUNTER — Ambulatory Visit: Payer: Medicare HMO | Admitting: Pulmonary Disease

## 2017-06-16 ENCOUNTER — Other Ambulatory Visit: Payer: Medicare HMO

## 2017-06-16 DIAGNOSIS — E039 Hypothyroidism, unspecified: Secondary | ICD-10-CM

## 2017-06-17 ENCOUNTER — Telehealth: Payer: Self-pay | Admitting: Family Medicine

## 2017-06-17 LAB — TSH: TSH: 1.67 u[IU]/mL (ref 0.450–4.500)

## 2017-06-17 MED ORDER — LEVOTHYROXINE SODIUM 88 MCG PO TABS: 88.0000 ug | ORAL_TABLET | Freq: Every day | ORAL | 3 refills | Status: DC

## 2017-06-17 NOTE — Telephone Encounter (Signed)
Patient notified

## 2017-06-17 NOTE — Telephone Encounter (Signed)
Please let her know that her thyroid is good and I've sent a years supply to [facility] for her.

## 2017-07-02 ENCOUNTER — Ambulatory Visit: Payer: Medicare HMO | Admitting: Pulmonary Disease

## 2017-07-02 ENCOUNTER — Encounter: Payer: Self-pay | Admitting: Pulmonary Disease

## 2017-07-02 VITALS — BP 130/80 | HR 99 | Ht 60.0 in | Wt 145.0 lb

## 2017-07-02 DIAGNOSIS — J449 Chronic obstructive pulmonary disease, unspecified: Secondary | ICD-10-CM | POA: Diagnosis not present

## 2017-07-02 MED ORDER — ALBUTEROL SULFATE HFA 108 (90 BASE) MCG/ACT IN AERS: 2.0000 | INHALATION_SPRAY | Freq: Four times a day (QID) | RESPIRATORY_TRACT | 2 refills | Status: DC | PRN

## 2017-07-02 NOTE — Progress Notes (Signed)
PULMONARY OFFICE FOLLOW UP NOTE  PROBLEMS:  Former smoker Moderate COPD History of spontaneous left PTX X 2 - s/p L pleurodesis   DATA: PFTs 10/23/14: moderate obstruction, FEV1 1.30 L (58% pred), normal lung volumes, normal DLCO LDCT 10/25/15: moderate emphysema, sequelae from prior pleurodesis, no suspicious nodules LDCT 10/26/16: No worrisome or new findings  INTERVAL HISTORY: Previous pt of VM last seen 05/27/16. She has recently undergone surgery on her R foot for a hammer toe.   SUBJ: This is a routine follow up. She has no new complaints.  No change in mild exertional dyspnea. She has stopped Spiriva altogether without any worsening of her symptoms..  She completed pulmonary rehab and believes it was very beneficial.  She wishes to go through pulmonary rehab again.   OBJ: Vitals:   07/02/17 1403 07/02/17 1409  BP:  130/80  Pulse:  99  SpO2:  97%  Weight: 65.8 kg (145 lb)   Height: 5' (1.524 m)     Gen: NAD HEENT: WNL Neck: No JVD noted Lungs: BS minimally diminished, no wheezes Cardiovascular: Regular, no murmurs Abdomen: Soft, NT +BS Ext: no C/C/E  Neuro: grossly intact  DATA: BMP Latest Ref Rng & Units 04/29/2017 10/27/2016 07/27/2016  Glucose 65 - 99 mg/dL 85 94 81  BUN 8 - 27 mg/dL 18 18 16   Creatinine 0.57 - 1.00 mg/dL 0.85 0.91 0.74  BUN/Creat Ratio 12 - 28 21 20 22   Sodium 134 - 144 mmol/L 144 141 145(H)  Potassium 3.5 - 5.2 mmol/L 5.3(H) 5.2 5.1  Chloride 96 - 106 mmol/L 104 100 105  CO2 20 - 29 mmol/L 26 28 26   Calcium 8.7 - 10.3 mg/dL 10.0 10.0 9.7   CBC Latest Ref Rng & Units 05/27/2017 04/29/2017 10/27/2016  WBC 3.4 - 10.8 x10E3/uL 5.1 4.4 5.0  Hemoglobin 11.1 - 15.9 g/dL 12.9 12.5 13.0  Hematocrit 34.0 - 46.6 % 38.3 35.7 40.0  Platelets 150 - 379 x10E3/uL 354 377 432(H)   No new CXR  IMPRESSION: COPD, moderate ([place]) - Plan: AMB referral to pulmonary rehabilitation    PLAN: Since it was of no benefit, I have instructed that it is okay for  her to remain off of Spiriva Continue albuterol inhaler as needed We will refer back to pulmonary rehabilitation Follow-up with [place] as needed   Merton Border, MD PCCM service Mobile [phone] Pager [phone] 07/04/2017

## 2017-07-02 NOTE — Patient Instructions (Signed)
We will refer you back to pulmonary rehab It is okay to remain off of Spiriva inhaler as we discussed Continue albuterol inhaler as needed Follow-up as needed

## 2017-07-05 ENCOUNTER — Telehealth: Payer: Self-pay | Admitting: Family Medicine

## 2017-07-05 ENCOUNTER — Telehealth: Payer: Self-pay | Admitting: Gastroenterology

## 2017-07-05 NOTE — Telephone Encounter (Signed)
Patient has switched pharmacies to [facility] in [place]..

## 2017-07-05 NOTE — Telephone Encounter (Signed)
Please send new prescriptions for Protinx 23m & [facility] [place][phone].Patient has [facility] over [facility] [place]

## 2017-07-06 ENCOUNTER — Other Ambulatory Visit: Payer: Self-pay

## 2017-07-06 DIAGNOSIS — Z86010 Personal history of colonic polyps: Secondary | ICD-10-CM

## 2017-07-06 NOTE — Telephone Encounter (Signed)
Contacted pt and she advised me is not ready for any refills at the moment. She only need to let me know she was switching pharmacies.

## 2017-07-14 ENCOUNTER — Telehealth: Payer: Self-pay | Admitting: Family Medicine

## 2017-07-14 NOTE — Telephone Encounter (Signed)
Pt. Requesting a refill on medications for acyclovir 400 mg tablet, Wellbutrin 150 mg, levothyroxine 88 mcg, lisinopril 5 mg tablet [facility] [place], [place].   Pt. Was having issues with previous pharmacy/[facility].  Pt. Aware provider is out of office for afternoon.  Please Advise. Thank you

## 2017-07-15 DIAGNOSIS — R69 Illness, unspecified: Secondary | ICD-10-CM | POA: Diagnosis not present

## 2017-07-15 MED ORDER — ACYCLOVIR 400 MG PO TABS: 400.0000 mg | ORAL_TABLET | Freq: Every day | ORAL | 3 refills | Status: DC

## 2017-07-15 MED ORDER — BUPROPION HCL ER (SR) 150 MG PO TB12: ORAL_TABLET | ORAL | 3 refills | Status: DC

## 2017-07-15 MED ORDER — LISINOPRIL 5 MG PO TABS: 5.0000 mg | ORAL_TABLET | Freq: Every day | ORAL | 1 refills | Status: DC

## 2017-07-15 MED ORDER — LEVOTHYROXINE SODIUM 88 MCG PO TABS: 88.0000 ug | ORAL_TABLET | Freq: Every day | ORAL | 3 refills | Status: DC

## 2017-07-22 ENCOUNTER — Telehealth: Payer: Self-pay | Admitting: Pulmonary Disease

## 2017-07-22 NOTE — Telephone Encounter (Signed)
Patient insurance will not allow her to fill proventil because it is not in their formulary   Please call

## 2017-07-22 NOTE — Telephone Encounter (Signed)
Pt advised to call her insurance company and find out what covered alternative is for Proventil. She was told to call office back with response. She did get a one time fill of Proventil in which she has not used yet. Nothing further needed.

## 2017-07-29 ENCOUNTER — Other Ambulatory Visit: Payer: Self-pay

## 2017-07-29 ENCOUNTER — Encounter: Payer: Self-pay | Admitting: *Deleted

## 2017-08-04 NOTE — Discharge Instructions (Signed)
General Anesthesia, Adult, Care After °These instructions provide you with information about caring for yourself after your procedure. Your health care provider may also give you more specific instructions. Your treatment has been planned according to current medical practices, but problems sometimes occur. Call your health care provider if you have any problems or questions after your procedure. °What can I expect after the procedure? °After the procedure, it is common to have: °· Vomiting. °· A sore throat. °· Mental slowness. ° °It is common to feel: °· Nauseous. °· Cold or shivery. °· Sleepy. °· Tired. °· Sore or achy, even in parts of your body where you did not have surgery. ° °Follow these instructions at home: °For at least 24 hours after the procedure: °· Do not: °? Participate in activities where you could fall or become injured. °? Drive. °? Use heavy machinery. °? Drink alcohol. °? Take sleeping pills or medicines that cause drowsiness. °? Make important decisions or sign legal documents. °? Take care of children on your own. °· Rest. °Eating and drinking °· If you vomit, drink water, juice, or soup when you can drink without vomiting. °· Drink enough fluid to keep your urine clear or pale yellow. °· Make sure you have little or no nausea before eating solid foods. °· Follow the diet recommended by your health care provider. °General instructions °· Have a responsible adult stay with you until you are awake and alert. °· Return to your normal activities as told by your health care provider. Ask your health care provider what activities are safe for you. °· Take over-the-counter and prescription medicines only as told by your health care provider. °· If you smoke, do not smoke without supervision. °· Keep all follow-up visits as told by your health care provider. This is important. °Contact a health care provider if: °· You continue to have nausea or vomiting at home, and medicines are not helpful. °· You  cannot drink fluids or start eating again. °· You cannot urinate after 8-12 hours. °· You develop a skin rash. °· You have fever. °· You have increasing redness at the site of your procedure. °Get help right away if: °· You have difficulty breathing. °· You have chest pain. °· You have unexpected bleeding. °· You feel that you are having a life-threatening or urgent problem. °This information is not intended to replace advice given to you by your health care provider. Make sure you discuss any questions you have with your health care provider. °Document Released: 09/21/2000 Document Revised: 11/18/2015 Document Reviewed: 05/30/2015 °Elsevier Interactive Patient Education © 2018 [employer] ° °

## 2017-08-05 ENCOUNTER — Encounter
Admission: RE | Disposition: A | Discharge status: Home / Self Care | Payer: Self-pay | Source: Ambulatory Visit | Attending: Gastroenterology

## 2017-08-05 ENCOUNTER — Ambulatory Visit: Payer: Medicare HMO | Admitting: Anesthesiology

## 2017-08-05 ENCOUNTER — Ambulatory Visit
Admission: RE | Admit: 2017-08-05 | Discharge: 2017-08-05 | Disposition: A | Discharge status: Home / Self Care | Payer: Medicare HMO | Source: Ambulatory Visit | Attending: Gastroenterology | Admitting: Gastroenterology

## 2017-08-05 DIAGNOSIS — E039 Hypothyroidism, unspecified: Secondary | ICD-10-CM | POA: Diagnosis not present

## 2017-08-05 DIAGNOSIS — C182 Malignant neoplasm of ascending colon: Secondary | ICD-10-CM | POA: Diagnosis not present

## 2017-08-05 DIAGNOSIS — I10 Essential (primary) hypertension: Secondary | ICD-10-CM | POA: Insufficient documentation

## 2017-08-05 DIAGNOSIS — C184 Malignant neoplasm of transverse colon: Secondary | ICD-10-CM | POA: Diagnosis not present

## 2017-08-05 DIAGNOSIS — D123 Benign neoplasm of transverse colon: Secondary | ICD-10-CM | POA: Diagnosis not present

## 2017-08-05 DIAGNOSIS — Z87891 Personal history of nicotine dependence: Secondary | ICD-10-CM | POA: Diagnosis not present

## 2017-08-05 DIAGNOSIS — J449 Chronic obstructive pulmonary disease, unspecified: Secondary | ICD-10-CM | POA: Insufficient documentation

## 2017-08-05 DIAGNOSIS — Z1211 Encounter for screening for malignant neoplasm of colon: Secondary | ICD-10-CM | POA: Insufficient documentation

## 2017-08-05 DIAGNOSIS — K635 Polyp of colon: Secondary | ICD-10-CM | POA: Diagnosis not present

## 2017-08-05 DIAGNOSIS — I2510 Atherosclerotic heart disease of native coronary artery without angina pectoris: Secondary | ICD-10-CM | POA: Insufficient documentation

## 2017-08-05 DIAGNOSIS — D122 Benign neoplasm of ascending colon: Secondary | ICD-10-CM

## 2017-08-05 DIAGNOSIS — Z800 Family history of malignant neoplasm of digestive organs: Secondary | ICD-10-CM | POA: Diagnosis not present

## 2017-08-05 DIAGNOSIS — Z860100 Personal history of colon polyps, unspecified: Secondary | ICD-10-CM

## 2017-08-05 DIAGNOSIS — D124 Benign neoplasm of descending colon: Secondary | ICD-10-CM | POA: Diagnosis not present

## 2017-08-05 DIAGNOSIS — Z7982 Long term (current) use of aspirin: Secondary | ICD-10-CM | POA: Diagnosis not present

## 2017-08-05 DIAGNOSIS — Z79899 Other long term (current) drug therapy: Secondary | ICD-10-CM | POA: Diagnosis not present

## 2017-08-05 HISTORY — PX: POLYPECTOMY: SHX5525

## 2017-08-05 HISTORY — PX: COLONOSCOPY WITH PROPOFOL: SHX5780

## 2017-08-05 SURGERY — COLONOSCOPY WITH PROPOFOL
Anesthesia: General | Site: Rectum | Wound class: Contaminated

## 2017-08-05 MED ORDER — FENTANYL CITRATE (PF) 100 MCG/2ML IJ SOLN: 25.0000 ug | INTRAMUSCULAR | Status: DC | PRN

## 2017-08-05 MED ORDER — LACTATED RINGERS IV SOLN
10.0000 mL/h | INTRAVENOUS | Status: DC
  Administered 2017-08-05: 10 mL/h via INTRAVENOUS

## 2017-08-05 MED ORDER — STERILE WATER FOR IRRIGATION IR SOLN
Status: DC | PRN
  Administered 2017-08-05: 11:00:00

## 2017-08-05 MED ORDER — PROPOFOL 10 MG/ML IV BOLUS
INTRAVENOUS | Status: DC | PRN
  Administered 2017-08-05: 30 mg via INTRAVENOUS
  Administered 2017-08-05 (×5): 20 mg via INTRAVENOUS
  Administered 2017-08-05: 30 mg via INTRAVENOUS
  Administered 2017-08-05: 50 mg via INTRAVENOUS
  Administered 2017-08-05 (×4): 20 mg via INTRAVENOUS
  Administered 2017-08-05: 30 mg via INTRAVENOUS
  Administered 2017-08-05: 20 mg via INTRAVENOUS
  Administered 2017-08-05: 10 mg via INTRAVENOUS
  Administered 2017-08-05: 80 mg via INTRAVENOUS
  Administered 2017-08-05 (×4): 20 mg via INTRAVENOUS
  Administered 2017-08-05: 10 mg via INTRAVENOUS

## 2017-08-05 MED ORDER — OXYCODONE HCL 5 MG PO TABS: 5.0000 mg | ORAL_TABLET | Freq: Once | ORAL | Status: DC | PRN

## 2017-08-05 MED ORDER — LACTATED RINGERS IV SOLN: 1000.0000 mL | INTRAVENOUS | Status: DC

## 2017-08-05 MED ORDER — MEPERIDINE HCL 25 MG/ML IJ SOLN: 6.2500 mg | INTRAMUSCULAR | Status: DC | PRN

## 2017-08-05 MED ORDER — SODIUM CHLORIDE 0.9 % IV SOLN: INTRAVENOUS | Status: DC

## 2017-08-05 MED ORDER — OXYCODONE HCL 5 MG/5ML PO SOLN: 5.0000 mg | Freq: Once | ORAL | Status: DC | PRN

## 2017-08-05 MED ORDER — PROMETHAZINE HCL 25 MG/ML IJ SOLN: 6.2500 mg | INTRAMUSCULAR | Status: DC | PRN

## 2017-08-05 MED ORDER — LIDOCAINE HCL (CARDIAC) 20 MG/ML IV SOLN
INTRAVENOUS | Status: DC | PRN
  Administered 2017-08-05: 40 mg via INTRAVENOUS

## 2017-08-05 SURGICAL SUPPLY — 14 items
CANISTER SUCT 1200ML W/VALVE (MISCELLANEOUS) ×3 IMPLANT
CLIP HMST11XOPN 235X2.8X (MISCELLANEOUS) ×8 IMPLANT
CLIP RESOLUTION 360 11X235 (MISCELLANEOUS) ×4
FORCEPS BIOP RAD 4 LRG CAP 4 (CUTTING FORCEPS) IMPLANT
GOWN CVR UNV OPN BCK APRN NK (MISCELLANEOUS) ×4 IMPLANT
GOWN ISOL THUMB LOOP REG UNIV (MISCELLANEOUS) ×2
INJECTOR VARIJECT VIN23 (MISCELLANEOUS) ×2 IMPLANT
KIT ENDO PROCEDURE OLY (KITS) ×3 IMPLANT
MARKER SPOT ENDO TATTOO 5ML (MISCELLANEOUS) ×2 IMPLANT
SNARE SHORT THROW 13M SML OVAL (MISCELLANEOUS) ×3 IMPLANT
SPOT EX ENDOSCOPIC TATTOO (MISCELLANEOUS) ×1
TRAP ETRAP POLY (MISCELLANEOUS) ×3 IMPLANT
VARIJECT INJECTOR VIN23 (MISCELLANEOUS) ×3
WATER STERILE IRR 250ML POUR (IV SOLUTION) ×3 IMPLANT

## 2017-08-05 NOTE — H&P (Signed)
Joanna Lame, MD [address] [place], [place] [address] Phone:[phone] Fax : [phone]  Primary Care Physician:  Valerie Roys, DO Primary Gastroenterologist:  Dr. Allen Norris  Pre-Procedure History & Physical: HPI:  Joanna Rodriguez is a 67 y.o. female is here for an colonoscopy.   Past Medical History:  Diagnosis Date  . Arthritis   . Cancer [facility])    face  . COPD (chronic obstructive pulmonary disease) ([place])   . Crohn's disease ([place])   . Dyspnea on exertion   . Genital herpes   . Hypertension   . Hypothyroid   . Kidney stones   . Osteopenia   . Personal history of tobacco use, presenting hazards to health 10/23/2015  . Scoliosis   . Spontaneous pneumothorax   . Vertigo    last episode 11/14/15  . [facility])     Past Surgical History:  Procedure Laterality Date  . ABDOMINAL HYSTERECTOMY    . APPENDECTOMY    . BREAST EXCISIONAL BIOPSY Bilateral 1990's   NEG  . CAPSULOTOMY METATARSOPHALANGEAL Left 03/19/2016   Procedure: CAPSULOTOMY METATARSOPHALANGEAL;  Surgeon: Albertine Patricia, DPM;  Location: [place];  Service: Podiatry;  Laterality: Left;  Second left toe.  . ESOPHAGOGASTRODUODENOSCOPY (EGD) WITH PROPOFOL N/A 11/21/2015   Procedure: ESOPHAGOGASTRODUODENOSCOPY (EGD) WITH PROPOFOL with dialation;  Surgeon: Joanna Lame, MD;  Location: [place];  Service: Endoscopy;  Laterality: N/A;  . FLEXOR TENDON REPAIR Left 03/19/2016   Procedure: FLEXOR TENDON release, percutaneous.;  Surgeon: Albertine Patricia, DPM;  Location: [place];  Service: Podiatry;  Laterality: Left;  4th left toe.  Marland Kitchen HAMMER TOE SURGERY Left 03/19/2016   Procedure: HAMMER TOE CORRECTION with PIPjoint fusion;  Surgeon: Albertine Patricia, DPM;  Location: [place];  Service: Podiatry;  Laterality: Left;  Third left toe.  Marland Kitchen HAMMER TOE SURGERY Right 08/06/2016   Procedure: HAMMER TOE CORRECTION  RIGHT 2ND AND 3RD;  Surgeon: Albertine Patricia, DPM;   Location: [place];  Service: Podiatry;  Laterality: Right;  LMA with local Special Needs:  [employer] Needs to be 2nd patient per office  . TALC PLEURODESIS  07/2014   left side  . THORACOSCOPY  07/2014   left side  . TUBAL LIGATION    . VOLVULUS REDUCTION    . WEIL OSTEOTOMY Right 08/06/2016   Procedure: WEIL right 2nd & 3rd;  Surgeon: Albertine Patricia, DPM;  Location: [place];  Service: Podiatry;  Laterality: Right;    Prior to Admission medications   Medication Sig Start Date End Date Taking? Authorizing Provider  acyclovir (ZOVIRAX) 400 MG tablet Take 1 tablet (400 mg total) by mouth daily. 07/15/17  Yes Johnson, Megan P, DO  Alum & Mag Hydroxide-Simeth (ANTACID ANTI-GAS MAX STRENGTH PO) Take 1 each by mouth daily. Reported on 11/21/2015 12/11/14  Yes [provider]  aspirin EC 81 MG tablet Take 81 mg by mouth daily.   Yes [provider]  BLACK COHOSH PO Take by mouth daily.   Yes [provider]  buPROPion (WELLBUTRIN SR) 150 MG 12 hr tablet 1 tab daily for 1 week, then 1 tab BID 07/15/17  Yes Johnson, Megan P, DO  Calcium Carb-Cholecalciferol 600-800 MG-UNIT TABS Take by mouth 2 (two) times daily.   Yes [provider]  Co-Enzyme Q-10 30 MG CAPS Take 1 capsule by mouth daily. 03/10/14  Yes [provider]  Glucosamine-Chondroit-Vit C-Mn (SM GLUCOSAMINE/CHONDROITIN PO) Take 1,500 mg by mouth 2 (  two) times daily.   Yes [provider]  levothyroxine (SYNTHROID, LEVOTHROID) 88 MCG tablet Take 1 tablet (88 mcg total) by mouth daily. 07/15/17  Yes Johnson, Megan P, DO  lisinopril (PRINIVIL,ZESTRIL) 5 MG tablet Take 1 tablet (5 mg total) by mouth daily. 07/15/17  Yes Johnson, Megan P, DO  Melatonin 5 MG TABS Take 5 mg by mouth at bedtime as needed. 08/03/14  Yes [provider]  mercaptopurine (PURINETHOL) 50 MG tablet TAKE 1 AND 1/2 TABLETS EVERY DAY 05/27/17  Yes Joanna Lame, MD  Multiple  Vitamins-Minerals (MULTIVITAMIN WITH MINERALS) tablet Take 1 tablet by mouth daily. 03/10/14  Yes [provider]  pantoprazole (PROTONIX) 40 MG tablet TAKE 1 TABLET EVERY DAY 08/26/16  Yes Joanna Lame, MD  traMADol (ULTRAM) 50 MG tablet TAKE 1 TABLET THREE TIMES DAILY 06/17/16  Yes Joanna Lame, MD  vitamin B-12 (CYANOCOBALAMIN) 1000 MCG tablet Take 1,000 mcg by mouth daily.   Yes [provider]  albuterol (PROVENTIL HFA;VENTOLIN HFA) 108 (90 Base) MCG/ACT inhaler Inhale 2 puffs into the lungs every 6 (six) hours as needed for wheezing or shortness of breath. 07/02/17   Wilhelmina Mcardle, MD    Allergies as of 07/06/2017 - Review Complete 07/02/2017  Allergen Reaction Noted  . Nsaids  09/06/2014    Family History  Problem Relation Age of Onset  . Hypertension Mother   . Hypothyroidism Mother   . Alcohol abuse Mother   . Breast cancer Mother 42  . Hyperlipidemia Mother   . Mental illness Mother   . Heart attack Father   . Heart disease Father   . Glaucoma Father   . Colon cancer Father 4  . Diabetes Father   . Hypertension Father   . Heart disease Sister   . Hyperlipidemia Sister   . Hypertension Sister   . Lung disease Sister     Social History   Socioeconomic History  . Marital status: Divorced    Spouse name: Not on file  . Number of children: Not on file  . Years of education: Not on file  . Highest education level: Not on file  Social Needs  . Financial resource strain: Not on file  . Food insecurity - worry: Not on file  . Food insecurity - inability: Not on file  . Transportation needs - medical: Not on file  . Transportation needs - non-medical: Not on file  Occupational History  . Occupation: Press photographer    Comment: Sales force Rep - stocking\packing  Tobacco Use  . Smoking status: Former Smoker    Packs/day: 1.50    Years: 53.00    Pack years: 79.50    Types: Cigarettes    Last attempt to quit: 08/22/2014    Years since quitting: 2.9  .  Smokeless tobacco: Never Used  . Tobacco comment: NOV 16,2015  Substance and Sexual Activity  . Alcohol use: Yes    Alcohol/week: 0.0 oz    Comment: occasional - 2-3x/yr  . Drug use: No  . Sexual activity: No  Other Topics Concern  . Not on file  Social History Narrative  . Not on file    Review of Systems: See HPI, otherwise negative ROS  Physical Exam: BP 140/72   Pulse 75   Temp 97.7 F (36.5 C) (Temporal)   Resp 16   Ht 5' (1.524 m)   Wt 143 lb (64.9 kg)   SpO2 100%   BMI 27.93 kg/m  General:   Alert,  pleasant  and cooperative in NAD Head:  Normocephalic and atraumatic. Neck:  Supple; no masses or thyromegaly. Lungs:  Clear throughout to auscultation.    Heart:  Regular rate and rhythm. Abdomen:  Soft, nontender and nondistended. Normal bowel sounds, without guarding, and without rebound.   Neurologic:  Alert and  oriented x4;  grossly normal neurologically.  Impression/Plan: Joanna Rodriguez is here for an colonoscopy to be performed for history of colonpolyps  Risks, benefits, limitations, and alternatives regarding  colonoscopy have been reviewed with the patient.  Questions have been answered.  All parties agreeable.   Joanna Lame, MD  08/05/2017, 10:13 AM

## 2017-08-05 NOTE — Op Note (Signed)
[facility]astroenterology Patient Name: Joanna Rodriguez Procedure Date: 08/05/2017 10:31 AM MRN: [id number] Account #: [id number] Date of Birth: October 24, 1950 Admit Type: Outpatient Age: 67 Room: [facility] OR ROOM 01 Gender: Female Note Status: Finalized Procedure:            Colonoscopy Indications:          High risk colon cancer surveillance: Personal history                        of colonic polyps Providers:            Lucilla Lame MD, MD Referring MD:         Valerie Roys (Referring MD) Medicines:            Propofol per Anesthesia Complications:        No immediate complications. Procedure:            Pre-Anesthesia Assessment:                       - Prior to the procedure, a History and Physical was                        performed, and patient medications and allergies were                        reviewed. The patient's tolerance of previous                        anesthesia was also reviewed. The risks and benefits of                        the procedure and the sedation options and risks were                        discussed with the patient. All questions were                        answered, and informed consent was obtained. Prior                        Anticoagulants: The patient has taken no previous                        anticoagulant or antiplatelet agents. ASA Grade                        Assessment: II - A patient with mild systemic disease.                        After reviewing the risks and benefits, the patient was                        deemed in satisfactory condition to undergo the                        procedure.                       After obtaining informed consent, the colonoscope was  passed under direct vision. Throughout the procedure,                        the patient's blood pressure, pulse, and oxygen                        saturations were monitored continuously. The Olympus   Colonoscope [id number]) was introduced through the                        anus and advanced to the the cecum, identified by                        appendiceal orifice and ileocecal valve. The                        colonoscopy was performed without difficulty. The                        patient tolerated the procedure well. The quality of                        the bowel preparation was excellent. Findings:      The perianal and digital rectal examinations were normal.      Two sessile polyps were found in the ascending colon. The polyps were 5       to 7 mm in size. These polyps were removed with a cold snare. Resection       and retrieval were complete. To prevent bleeding post-intervention, one       hemostatic clip was successfully placed (MR conditional). There was no       bleeding at the end of the procedure.      A 12 mm polyp was found in the proximal transverse colon. The polyp was       sessile. Area was not raising with injected with 4 mL saline with indigo       carmine for a lift polypectomy. Biopsies were taken with a cold forceps       for histology. Area was tattooed with an injection of 3 mL of [place] ink.      Two sessile polyps were found in the transverse colon. The polyps were 6       to 8 mm in size. These polyps were removed with a cold snare. Resection       and retrieval were complete. To prevent bleeding post-intervention, two       hemostatic clips were successfully placed (MR conditional). There was no       bleeding at the end of the procedure.      Two sessile polyps were found in the descending colon. The polyps were 5       to 7 mm in size. These polyps were removed with a cold snare. Resection       and retrieval were complete. To prevent bleeding post-intervention, one       hemostatic clip was successfully placed (MR conditional). Impression:           - Two 5 to 7 mm polyps in the ascending colon, removed                        with a cold snare.  Resected and retrieved. Clip (MR  conditional) was placed.                       - One 12 mm polyp in the proximal transverse colon.                        Biopsied. Tattooed.                       - Two 6 to 8 mm polyps in the transverse colon, removed                        with a cold snare. Resected and retrieved. Clips (MR                        conditional) were placed.                       - Two 5 to 7 mm polyps in the descending colon, removed                        with a cold snare. Resected and retrieved. Clip (MR                        conditional) was placed. Recommendation:       - Discharge patient to home.                       - Resume previous diet.                       - Continue present medications.                       - Await pathology results.                       - If the biopsies of the transverse lesion that would                        not lift come back as adenocarcnoma then refer to                        surgery. Procedure Code(s):    --- Professional ---                       [phone], Colonoscopy, flexible; with removal of tumor(s),                        polyp(s), or other lesion(s) by snare technique                       45381, Colonoscopy, flexible; with directed submucosal                        injection(s), any substance                       92119, 59, Colonoscopy, flexible; with biopsy, single                        or multiple Diagnosis Code(s):    ---  Professional ---                       Z86.010, Personal history of colonic polyps                       D12.2, Benign neoplasm of ascending colon                       D12.3, Benign neoplasm of transverse colon (hepatic                        flexure or splenic flexure)                       D12.4, Benign neoplasm of descending colon CPT copyright 2016 American Medical Association. All rights reserved. The codes documented in this report are preliminary and upon coder  review may  be revised to meet current compliance requirements. Lucilla Lame MD, MD 08/05/2017 11:33:19 AM This report has been signed electronically. Number of Addenda: 0 Note Initiated On: 08/05/2017 10:31 AM Scope Withdrawal Time: 0 hours 32 minutes 18 seconds  Total Procedure Duration: 0 hours 36 minutes 38 seconds       [facility]

## 2017-08-05 NOTE — Anesthesia Preprocedure Evaluation (Signed)
Anesthesia Evaluation  Patient identified by MRN, date of birth, ID band Patient awake    Reviewed: Allergy & Precautions, H&P , NPO status , Patient's Chart, lab work & pertinent test results, reviewed documented beta blocker date and time   Airway Mallampati: I  TM Distance: >3 FB Neck ROM: full    Dental no notable dental hx.    Pulmonary shortness of breath, COPD,  COPD inhaler, former smoker,  Smoked for 53 years.  Does get SOB going up full flight of stairs   Pulmonary exam normal breath sounds clear to auscultation       Cardiovascular Exercise Tolerance: Good hypertension, + CAD  negative cardio ROS Normal cardiovascular exam Rhythm:regular Rate:Normal     Neuro/Psych negative neurological ROS  negative psych ROS   GI/Hepatic negative GI ROS, Neg liver ROS,   Endo/Other  Hypothyroidism   Renal/GU negative Renal ROS  negative genitourinary   Musculoskeletal   Abdominal   Peds  Hematology negative hematology ROS (+)   Anesthesia Other Findings   Reproductive/Obstetrics negative OB ROS                             Anesthesia Physical  Anesthesia Plan  ASA: III  Anesthesia Plan: General   Post-op Pain Management:    Induction:   PONV Risk Score and Plan:   Airway Management Planned:   Additional Equipment:   Intra-op Plan:   Post-operative Plan:   Informed Consent: I have reviewed the patients History and Physical, chart, labs and discussed the procedure including the risks, benefits and alternatives for the proposed anesthesia with the patient or authorized representative who has indicated his/her understanding and acceptance.   Dental Advisory Given  Plan Discussed with: CRNA  Anesthesia Plan Comments:         Anesthesia Quick Evaluation

## 2017-08-05 NOTE — OR Nursing (Signed)
1105-[place] submucosal injectable composition to facilitate endoscopic resection procedures injected, 96m injected in colon

## 2017-08-05 NOTE — Anesthesia Procedure Notes (Signed)
Date/Time: 08/05/2017 10:41 AM Performed by: Cameron Ali, CRNA Pre-anesthesia Checklist: Patient identified, Emergency Drugs available, Suction available, Timeout performed and Patient being monitored Patient Re-evaluated:Patient Re-evaluated prior to induction Oxygen Delivery Method: Nasal cannula Placement Confirmation: positive ETCO2

## 2017-08-05 NOTE — Transfer of Care (Signed)
Immediate Anesthesia Transfer of Care Note  Patient: Shaylynne Artie Takayama  Procedure(s) Performed: COLONOSCOPY WITH PROPOFOL (N/A Rectum) POLYPECTOMY (Rectum)  Patient Location: PACU  Anesthesia Type: General  Level of Consciousness: awake, alert  and patient cooperative  Airway and Oxygen Therapy: Patient Spontanous Breathing and Patient connected to supplemental oxygen  Post-op Assessment: Post-op Vital signs reviewed, Patient's Cardiovascular Status Stable, Respiratory Function Stable, Patent Airway and No signs of Nausea or vomiting  Post-op Vital Signs: Reviewed and stable  Complications: No apparent anesthesia complications

## 2017-08-05 NOTE — Anesthesia Postprocedure Evaluation (Signed)
Anesthesia Post Note  Patient: Joanna Rodriguez  Procedure(s) Performed: COLONOSCOPY WITH PROPOFOL (N/A Rectum) POLYPECTOMY (Rectum)  Patient location during evaluation: PACU Anesthesia Type: General Level of consciousness: awake and alert Pain management: pain level controlled Vital Signs Assessment: post-procedure vital signs reviewed and stable Respiratory status: spontaneous breathing, nonlabored ventilation, respiratory function stable and patient connected to nasal cannula oxygen Cardiovascular status: blood pressure returned to baseline and stable Postop Assessment: no apparent nausea or vomiting Anesthetic complications: no    [name] ELAINE

## 2017-08-09 ENCOUNTER — Other Ambulatory Visit: Payer: Self-pay | Admitting: Pathology

## 2017-08-09 LAB — SURGICAL PATHOLOGY

## 2017-08-12 ENCOUNTER — Telehealth: Payer: Self-pay

## 2017-08-12 NOTE — Telephone Encounter (Signed)
-----   Message from Lucilla Lame, MD sent at 08/12/2017  6:53 AM EST ----- Please have the patient come in for a follow up within the next week.

## 2017-08-12 NOTE — Telephone Encounter (Signed)
Pt scheduled for a follow up appt to discuss colonoscopy results per Dr. Allen Norris.

## 2017-08-12 NOTE — Telephone Encounter (Signed)
Pt's has been scheduled with Dr. Allen Norris on Tuesday, Feb 19th @ 1:00pm to discuss colonoscopy results.

## 2017-08-17 ENCOUNTER — Ambulatory Visit: Payer: Medicare HMO | Admitting: Gastroenterology

## 2017-08-17 ENCOUNTER — Encounter: Payer: Self-pay | Admitting: *Deleted

## 2017-08-17 ENCOUNTER — Encounter: Payer: Self-pay | Admitting: Gastroenterology

## 2017-08-17 VITALS — BP 148/84 | HR 94 | Ht 60.0 in | Wt 149.2 lb

## 2017-08-17 DIAGNOSIS — C189 Malignant neoplasm of colon, unspecified: Secondary | ICD-10-CM

## 2017-08-17 NOTE — Patient Instructions (Signed)
A referral has been sent to [facility], Dr. Bary Castilla. They will contact you to schedule this appointment.   Please contact our office is with issues or concerns.

## 2017-08-17 NOTE — Progress Notes (Signed)
Primary Care Physician: Valerie Roys, DO  Primary Gastroenterologist:  Dr. Lucilla Lame  Chief Complaint  Patient presents with  . Follow up Colonoscopy results    HPI: Joanna Rodriguez is a 67 y.o. female here for follow up after having a colonoscopy with polyps removed. The polyp in the descending colon and proximal transverse colon were both found to have at least intramucosal adenocarcinoma.  The other polyps were serrated adenomas.  The patient is now here for follow-up of the pathology results.  Current Outpatient Medications  Medication Sig Dispense Refill  . acyclovir (ZOVIRAX) 400 MG tablet Take 1 tablet (400 mg total) by mouth daily. 90 tablet 3  . albuterol (PROVENTIL HFA;VENTOLIN HFA) 108 (90 Base) MCG/ACT inhaler Inhale 2 puffs into the lungs every 6 (six) hours as needed for wheezing or shortness of breath. 1 Inhaler 2  . Alum & Mag Hydroxide-Simeth (ANTACID ANTI-GAS MAX STRENGTH PO) Take 1 each by mouth daily. Reported on 11/21/2015    . aspirin EC 81 MG tablet Take 81 mg by mouth daily.    Marland Kitchen BLACK COHOSH PO Take by mouth daily.    Marland Kitchen buPROPion (WELLBUTRIN SR) 150 MG 12 hr tablet 1 tab daily for 1 week, then 1 tab BID 180 tablet 3  . Calcium Carb-Cholecalciferol 600-800 MG-UNIT TABS Take by mouth 2 (two) times daily.    Marland Kitchen Co-Enzyme Q-10 30 MG CAPS Take 1 capsule by mouth daily.    . Glucosamine-Chondroit-Vit C-Mn (SM GLUCOSAMINE/CHONDROITIN PO) Take 1,500 mg by mouth 2 (two) times daily.    Marland Kitchen levothyroxine (SYNTHROID, LEVOTHROID) 88 MCG tablet Take 1 tablet (88 mcg total) by mouth daily. 90 tablet 3  . lisinopril (PRINIVIL,ZESTRIL) 5 MG tablet Take 1 tablet (5 mg total) by mouth daily. 90 tablet 1  . Melatonin 5 MG TABS Take 5 mg by mouth at bedtime as needed.    . mercaptopurine (PURINETHOL) 50 MG tablet TAKE 1 AND 1/2 TABLETS EVERY DAY 135 tablet 0  . Multiple Vitamins-Minerals (MULTIVITAMIN WITH MINERALS) tablet Take 1 tablet by mouth daily.    . pantoprazole  (PROTONIX) 40 MG tablet TAKE 1 TABLET EVERY DAY 90 tablet 3  . traMADol (ULTRAM) 50 MG tablet TAKE 1 TABLET THREE TIMES DAILY 270 tablet 1  . vitamin B-12 (CYANOCOBALAMIN) 1000 MCG tablet Take 1,000 mcg by mouth daily.     No current facility-administered medications for this visit.     Allergies as of 08/17/2017 - Review Complete 08/17/2017  Allergen Reaction Noted  . Nsaids  09/06/2014    ROS:  General: Negative for anorexia, weight loss, fever, chills, fatigue, weakness. ENT: Negative for hoarseness, difficulty swallowing , nasal congestion. CV: Negative for chest pain, angina, palpitations, dyspnea on exertion, peripheral edema.  Respiratory: Negative for dyspnea at rest, dyspnea on exertion, cough, sputum, wheezing.  GI: See history of present illness. GU:  Negative for dysuria, hematuria, urinary incontinence, urinary frequency, nocturnal urination.  Endo: Negative for unusual weight change.    Physical Examination:   BP (!) 148/84   Pulse 94   Ht 5' (1.524 m)   Wt 149 lb 3.2 oz (67.7 kg)   BMI 29.14 kg/m   General: Well-nourished, well-developed in no acute distress.  Eyes: No icterus. Conjunctivae pink. Neuro: Alert and oriented x 3.  Grossly intact. Skin: Warm and dry, no jaundice.   Psych: Alert and cooperative, normal mood and affect.  Labs:    Imaging Studies: No results found.  Assessment and Plan:  Joanna Rodriguez is a 67 y.o. y/o female who comes in for follow-up after having a colonoscopy with multiple polyps removed. The polyps on the right side of the colon were consistent with at least intramucosal adenocarcinoma.  The other polyps were straight adenomas.  The patient will be sent to surgery for a possible right hemicolectomy.  The patient has been explained the pathology and the results.  She agrees with the plan.    Lucilla Lame, MD. Mar[facility] Regal   Note: This dictation was prepared with Dragon dictation along with smaller phrase technology. Any  transcriptional errors that result from this process are unintentional.

## 2017-08-24 ENCOUNTER — Encounter: Payer: Self-pay | Admitting: General Surgery

## 2017-08-24 ENCOUNTER — Ambulatory Visit: Payer: Medicare HMO | Admitting: General Surgery

## 2017-08-24 VITALS — BP 130/72 | HR 78 | Resp 12 | Ht 60.0 in | Wt 149.0 lb

## 2017-08-24 DIAGNOSIS — C184 Malignant neoplasm of transverse colon: Secondary | ICD-10-CM

## 2017-08-24 NOTE — Progress Notes (Signed)
Patient ID: Joanna Rodriguez, female   DOB: [birth year], 67 y.o.   MRN: [id number]  Chief Complaint  Patient presents with  . Other    colon mass    HPI Joanna Rodriguez is a 67 y.o. female.  Here for evaluation of a cancerous colon mass referred by Dr Allen Norris. She states her prior colonoscopy was 5 years ago. Denies any GI issues. Bowels move daily. She states her Crohn's is controlled. H Pylori August 2017. She is a retired Marine scientist. She is here with her daughter, Joanna Rodriguez).  HPI  Past Medical History:  Diagnosis Date  . Arthritis   . Cancer [facility])    face  . COPD (chronic obstructive pulmonary disease) ([place])   . Crohn's disease ([place])    1989 ?  Marland Kitchen Dyspnea on exertion   . Genital herpes   . H/O pneumothorax    x 2  . Hypertension   . Hypothyroid   . Kidney stones   . Osteopenia   . Personal history of tobacco use, presenting hazards to health 10/23/2015  . Scoliosis   . Spontaneous pneumothorax   . Vertigo    last episode 11/14/15  . [facility])     Past Surgical History:  Procedure Laterality Date  . ABDOMINAL HYSTERECTOMY    . APPENDECTOMY    . BREAST EXCISIONAL BIOPSY Bilateral 1990's   NEG  . CAPSULOTOMY METATARSOPHALANGEAL Left 03/19/2016   Procedure: CAPSULOTOMY METATARSOPHALANGEAL;  Surgeon: Albertine Patricia, DPM;  Location: [place];  Service: Podiatry;  Laterality: Left;  Second left toe.  Marland Kitchen COLONOSCOPY WITH PROPOFOL N/A 08/05/2017   Procedure: COLONOSCOPY WITH PROPOFOL;  Surgeon: Lucilla Lame, MD;  Location: [place];  Service: Endoscopy;  Laterality: N/A;  . ESOPHAGOGASTRODUODENOSCOPY (EGD) WITH PROPOFOL N/A 11/21/2015   Procedure: ESOPHAGOGASTRODUODENOSCOPY (EGD) WITH PROPOFOL with dialation;  Surgeon: Lucilla Lame, MD;  Location: [place];  Service: Endoscopy;  Laterality: N/A;  . FLEXOR TENDON REPAIR Left 03/19/2016   Procedure: FLEXOR TENDON release, percutaneous.;  Surgeon: Albertine Patricia, DPM;   Location: [place];  Service: Podiatry;  Laterality: Left;  4th left toe.  Marland Kitchen HAMMER TOE SURGERY Left 03/19/2016   Procedure: HAMMER TOE CORRECTION with PIPjoint fusion;  Surgeon: Albertine Patricia, DPM;  Location: [place];  Service: Podiatry;  Laterality: Left;  Third left toe.  Marland Kitchen HAMMER TOE SURGERY Right 08/06/2016   Procedure: HAMMER TOE CORRECTION  RIGHT 2ND AND 3RD;  Surgeon: Albertine Patricia, DPM;  Location: [place];  Service: Podiatry;  Laterality: Right;  LMA with local Special Needs:  [place]  [employer] Needs to be 2nd patient per office  . POLYPECTOMY  08/05/2017   Procedure: POLYPECTOMY;  Surgeon: Lucilla Lame, MD;  Location: [place];  Service: Endoscopy;;  . TALC PLEURODESIS  07/2014   left side  . THORACOSCOPY  07/2014   left side  . TUBAL LIGATION    . [place], with small bowel resection  . WEIL OSTEOTOMY Right 08/06/2016   Procedure: WEIL right 2nd & 3rd;  Surgeon: Albertine Patricia, DPM;  Location: [place];  Service: Podiatry;  Laterality: Right;    Family History  Problem Relation Age of Onset  . Hypertension Mother   . Hypothyroidism Mother   . Alcohol abuse Mother   . Breast cancer Mother 48  . Hyperlipidemia Mother   . Mental illness Mother   . Heart attack Father   . Heart disease  Father   . Glaucoma Father   . Colon cancer Father 58  . Diabetes Father   . Hypertension Father   . Heart disease Sister   . Hyperlipidemia Sister   . Hypertension Sister   . Lung disease Sister     Social History Social History   Tobacco Use  . Smoking status: Former Smoker    Packs/day: 1.50    Years: 53.00    Pack years: 79.50    Types: Cigarettes    Last attempt to quit: 08/22/2014    Years since quitting: 3.0  . Smokeless tobacco: Never Used  . Tobacco comment: NOV 16,2015  Substance Use Topics  . Alcohol use: Yes    Alcohol/week: 0.0 oz    Comment: occasional - 2-3x/yr  . Drug use: No     Allergies  Allergen Reactions  . Nsaids     Other reaction(s): Caused Crohn's flare up.    Current Outpatient Medications  Medication Sig Dispense Refill  . acyclovir (ZOVIRAX) 400 MG tablet Take 1 tablet (400 mg total) by mouth daily. (Patient taking differently: Take 400 mg by mouth daily with lunch. ) 90 tablet 3  . albuterol (PROVENTIL HFA;VENTOLIN HFA) 108 (90 Base) MCG/ACT inhaler Inhale 2 puffs into the lungs every 6 (six) hours as needed for wheezing or shortness of breath. 1 Inhaler 2  . buPROPion (WELLBUTRIN SR) 150 MG 12 hr tablet 1 tab daily for 1 week, then 1 tab BID (Patient taking differently: Take 150 mg by mouth 2 (two) times daily. ) 180 tablet 3  . levothyroxine (SYNTHROID, LEVOTHROID) 88 MCG tablet Take 1 tablet (88 mcg total) by mouth daily. (Patient taking differently: Take 88 mcg by mouth daily before breakfast. 45 minutes to 1 hours prior to breakfast) 90 tablet 3  . lisinopril (PRINIVIL,ZESTRIL) 5 MG tablet Take 1 tablet (5 mg total) by mouth daily. 90 tablet 1  . Melatonin 5 MG TABS Take 5 mg by mouth at bedtime as needed (for sleep.).     Marland Kitchen mercaptopurine (PURINETHOL) 50 MG tablet TAKE 1 AND 1/2 TABLETS EVERY DAY (Patient taking differently: Take 75 mg by mouth daily with breakfast. TAKE 1 AND 1/2 TABLETS EVERY DAY) 135 tablet 0  . Multiple Vitamins-Minerals (MULTIVITAMIN WITH MINERALS) tablet Take 1 tablet by mouth daily. One-A-Day for 50+    . pantoprazole (PROTONIX) 40 MG tablet TAKE 1 TABLET EVERY DAY (Patient taking differently: TAKE 1 TABLET EVERY DAILY AT LUNCH TIME.) 90 tablet 3  . traMADol (ULTRAM) 50 MG tablet TAKE 1 TABLET THREE TIMES DAILY (Patient taking differently: TAKE 1 TABLET (50 MG) THREE TIMES DAILY AS NEEDED FOR BACK PAIN.) 270 tablet 1  . aspirin EC 81 MG tablet Take 81 mg by mouth daily.    . Black Cohosh 540 MG CAPS Take 540 mg by mouth daily with supper.    . Calcium Carb-Cholecalciferol (CALCIUM 600+D3 PO) Take 1 tablet by mouth 2 (two)  times daily.    . Glucosamine-Chondroitin (COSAMIN DS PO) Take 2 tablets by mouth 2 (two) times daily.    . vitamin B-12 (CYANOCOBALAMIN) 1000 MCG tablet Take 1,000 mcg by mouth daily.     No current facility-administered medications for this visit.     Review of Systems Review of Systems  Constitutional: Negative.   Respiratory: Negative.   Cardiovascular: Negative.   Gastrointestinal: Negative for constipation, diarrhea and nausea.    Blood pressure 130/72, pulse 78, resp. rate 12, height 5' (1.524 m), weight 149 lb (67.6  kg), last menstrual period 06/03/1972, SpO2 98 %.  Physical Exam Physical Exam  Constitutional: She is oriented to person, place, and time. She appears well-developed and well-nourished.  HENT:  Mouth/Throat: Oropharynx is clear and moist.  Eyes: Conjunctivae are normal. No scleral icterus.  Neck: Neck supple.  Cardiovascular: Normal rate, regular rhythm and normal heart sounds.  Pulses:      Femoral pulses are 2+ on the right side, and 2+ on the left side. No lower leg edema  Pulmonary/Chest: Effort normal and breath sounds normal.  Abdominal: Soft. Normal appearance and bowel sounds are normal. There is no tenderness. No hernia.    Abdominal incision well healed. Diastasis recti present   Lymphadenopathy:    She has no cervical adenopathy.  Neurological: She is alert and oriented to person, place, and time.  Skin: Skin is warm and dry.  Psychiatric: Her behavior is normal.    Data Reviewed October 26, 2016 chest CT, screening reviewed to assess proximal abdominal structures.  Possible small 1 cm fascial defect superiorly. Limited view of the upper abdomen.   Laboratory studies from November 1 through day May 27, 2017 reviewed.  Hemoglobin 12.9, MCV 100, platelet count 354,000.  White blood cell count 5100 with normal differential.  Liver function studies normal.  TSH low at 0.341.  B12 elevated at 1658.  Normal basic metabolic panel.  Cardiac  stress test of July 24, 2015 was normal.  Colonoscopy results August 05, 2017: DIAGNOSIS:  A. COLON POLYP, PROXIMAL TRANSVERSE; COLD FORCEPS:  - AT LEAST INTRAMUCOSAL ADENOCARCINOMA, SEE COMMENT.  - SEPARATE FRAGMENTS OF SESSILE SERRATED ADENOMA.   B. COLON POLYP 2, ASCENDING; COLD SNARE:  - SMALL FOCI OF INTRAMUCOSAL ADENOCARCINOMA WITH ADJACENT SESSILE  SERRATED ADENOMA (2 FRAGMENTS).  - SESSILE SERRATED ADENOMA (8 FRAGMENTS).   C. COLON POLYP 2, TRANSVERSE; COLD SNARE:  - SESSILE SERRATED ADENOMA (8 FRAGMENTS).  - NEGATIVE FOR HIGH-GRADE DYSPLASIA AND MALIGNANCY.   D. COLON POLYP 2, DESCENDING; COLD SNARE:  - SESSILE SERRATED ADENOMA(9 FRAGMENTS).  - NEGATIVE FOR HIGH-GRADE DYSPLASIA AND MALIGNANCY.   Comment:  Poorly differentiated intra mucosal adenocarcinoma is present in the  specimens A and B. In specimen A (proximal transverse) invasion into the  submucosa cannot be excluded. In specimen B (ascending colon), the  adenocarcinoma appears limited to the mucosa.  Assessment    Recently identified intramucosal adenocarcinoma of the proximal transverse as well as descending colon.    Plan    Indication for surgical therapy reviewed.  Plan for laparoscopic procedure with recognition that based on her previous intra-abdominal procedures (hysterectomy, appendectomy and small bowel resection, as well as the possibility of midline hernia formation that this may not be possible.  The patient is anxious to avoid a stoma, which I would find unlikely based on the right colonic pathology.  She has some pulmonary compromise based on her long-term smoking but no active symptoms at this time.  Her most recent pulmonary evaluation in January 2019 with Merton Border,, MD showed a stable exam.    Operative intervention, although it appears she has been very stable for decades on her past history of inflammatory bowel disease may affect mercaptopurine.  Bowel prep instructions  reviewed.  Patient is to consume a high carbohydrate drink prior to presenting for surgery. May have Clear liquids until 9 am when you then will drink 12 oz of cranapple juice    HPI, Physical Exam, Assessment and Plan have been scribed under the direction and in the  presence of Robert Bellow, MD. Karie Fetch, RN  I have completed the exam and reviewed the above documentation for accuracy and completeness.  I agree with the above.  Haematologist has been used and any errors in dictation or transcription are unintentional.  Hervey Ard, M.D., F.A.C.S.   The patient is scheduled for surgery at [facility] on 08/27/17. She will pre admit at the hospital on 08/26/17 at 10:30 am. Surgery instructions reviewed. The patient is aware of dates and instructions.  Documented by Lesly Rubenstein LPN  Forest Gleason Britt Theard 08/24/2017, 8:22 PM

## 2017-08-24 NOTE — Patient Instructions (Addendum)
Right colectomy  May have Clear liquids until 9 am when you then will drink 12 oz of cranapple juice The patient is aware to call back for any questions or new concerns.  The patient is scheduled for surgery at [facility] on 08/27/17. She will pre admit at the hospital on 08/26/17 at 10:30 am. Surgery instructions reviewed. The patient is aware of dates and instructions.

## 2017-08-25 ENCOUNTER — Other Ambulatory Visit: Payer: Self-pay | Admitting: *Deleted

## 2017-08-25 MED ORDER — POLYETHYLENE GLYCOL 3350 17 GM/SCOOP PO POWD: ORAL | 0 refills | Status: DC

## 2017-08-25 MED ORDER — METRONIDAZOLE 500 MG PO TABS: ORAL_TABLET | ORAL | 0 refills | Status: DC

## 2017-08-25 MED ORDER — NEOMYCIN SULFATE 500 MG PO TABS: ORAL_TABLET | ORAL | 0 refills | Status: DC

## 2017-08-26 ENCOUNTER — Encounter
Admission: RE | Admit: 2017-08-26 | Discharge: 2017-08-26 | Disposition: A | Discharge status: Home / Self Care | Payer: Medicare HMO | Source: Ambulatory Visit | Attending: General Surgery | Admitting: General Surgery

## 2017-08-26 ENCOUNTER — Other Ambulatory Visit: Payer: Self-pay

## 2017-08-26 DIAGNOSIS — I10 Essential (primary) hypertension: Secondary | ICD-10-CM | POA: Diagnosis not present

## 2017-08-26 HISTORY — DX: Personal history of urinary calculi: Z87.442

## 2017-08-26 LAB — BASIC METABOLIC PANEL
Anion gap: 9 (ref 5–15)
BUN: 19 mg/dL (ref 6–20)
CHLORIDE: 103 mmol/L (ref 101–111)
CO2: 27 mmol/L (ref 22–32)
Calcium: 9.2 mg/dL (ref 8.9–10.3)
Creatinine, Ser: 0.91 mg/dL (ref 0.44–1.00)
GFR calc Af Amer: >60 mL/min (ref >60)
GLUCOSE: 88 mg/dL (ref 65–99)
POTASSIUM: 4.3 mmol/L (ref 3.5–5.1)
Sodium: 139 mmol/L (ref 135–145)

## 2017-08-26 LAB — CBC WITH DIFFERENTIAL/PLATELET
BASOS PCT: 1 %
Basophils Absolute: 0 10*3/uL (ref 0–0.1)
EOS ABS: 0 10*3/uL (ref 0–0.7)
Eosinophils Relative: 1 %
HCT: 36.1 % (ref 35.0–47.0)
Hemoglobin: 12.2 g/dL (ref 12.0–16.0)
LYMPHS ABS: 1.3 10*3/uL (ref 1.0–3.6)
Lymphocytes Relative: 26 %
MCH: 35.7 pg — AB (ref 26.0–34.0)
MCHC: 33.8 g/dL (ref 32.0–36.0)
MCV: 105.6 fL — ABNORMAL HIGH (ref 80.0–100.0)
Monocytes Absolute: 0.4 10*3/uL (ref 0.2–0.9)
Monocytes Relative: 8 %
Neutro Abs: 3.2 10*3/uL (ref 1.4–6.5)
Neutrophils Relative %: 64 %
Platelets: 391 10*3/uL (ref 150–440)
RBC: 3.42 MIL/uL — AB (ref 3.80–5.20)
RDW: 15.6 % — ABNORMAL HIGH (ref 11.5–14.5)
WBC: 5 10*3/uL (ref 3.6–11.0)

## 2017-08-26 MED ORDER — SODIUM CHLORIDE 0.9 % IV SOLN
1.0000 g | INTRAVENOUS | Status: AC
  Administered 2017-08-27: 1 g via INTRAVENOUS
  Filled 2017-08-26: qty 1

## 2017-08-26 NOTE — Patient Instructions (Signed)
Your procedure is scheduled on: August 27, 2017 FRIDAY Report to Same Day Surgery on the 2nd floor in the [facility]. To find out your arrival time, please call [phone] between 1PM - 3PM on: Thursday August 26, 2016  REMEMBER: Instructions that are not followed completely may result in serious medical risk, up to and including death; or upon the discretion of your surgeon and anesthesiologist your surgery may need to be rescheduled..    FOLLOW BOWEL PREP INSTRUCTIONS AND INSTRUCTION FOR WHAT TO EAT AND DRINK BEFORE SURGERY GIVEN BY DR Dwyane Luo OFFICE  No Alcohol for 24 hours before or after surgery.  No Smoking including e-cigarettes for 24 hours prior to surgery. No chewable tobacco products for at least 6 hours prior to surgery. No nicotine patches on the day of surgery.  On the morning of surgery brush your teeth with toothpaste and water, you may rinse your mouth with mouthwash if you wish. Do not swallow any  toothpaste of mouthwash.  Notify your doctor if there is any change in your medical condition (cold, fever, infection).  Do not wear jewelry, make-up, hairpins, clips or nail polish.  Do not wear lotions, powders, or perfumes. You may NOT wear deodorant.  Do not shave 48 hours prior to surgery. Men may shave face and neck.  Contacts and dentures may not be worn into surgery.  Do not bring valuables to the hospital. [facility] is not responsible for any belongings or valuables.  TAKE THESE MEDICATIONS THE MORNING OF SURGERY: LEVOTHYROXINE  WELLBUTRIN PANTOPRAZOLE-TAKE DOSE THE NIGHT BEFORE SURGERY AND A DOSE THE MORNING OF SURGERY  Use CHG Soap or wipes as directed on instruction sheet.  FOLLOW BOWEL PREP INSTRUCTIONS  Use inhalers on the day of surgery and bring to the hospital.  Follow recommendations from Cardiologist, Pulmonologist or PCP regarding stopping Aspirin, Coumadin, Plavix, Eliquis, Pradaxa, or Pletal.  Stop Anti-inflammatories such as  Advil, Aleve, Ibuprofen, Motrin, Naproxen, Naprosyn, Goodie powder, or aspirin products. (May take Tylenol or Acetaminophen if needed.)  Stop ANY OVER THE COUNTER supplements until after surgery. (May continue Vitamin D, Vitamin B, and multivitamin.)  If you are being admitted to the hospital overnight, leave your suitcase in the car. After surgery it may be brought to your room.  If you are being discharged the day of surgery, you will not be allowed to drive home. You will need someone to drive you home and stay with you that night.   If you are taking public transportation, you will need to have a responsible adult to with you.  Please call the number above if you have any questions about these instructions.

## 2017-08-27 ENCOUNTER — Encounter: Payer: Self-pay | Admitting: *Deleted

## 2017-08-27 ENCOUNTER — Inpatient Hospital Stay: Payer: Medicare HMO | Admitting: Anesthesiology

## 2017-08-27 ENCOUNTER — Other Ambulatory Visit: Payer: Self-pay

## 2017-08-27 ENCOUNTER — Inpatient Hospital Stay
Admission: RE | Admit: 2017-08-27 | Discharge: 2017-08-31 | DRG: 330 | Disposition: A | Discharge status: Home / Self Care | Payer: Medicare HMO | Source: Ambulatory Visit | Attending: General Surgery | Admitting: General Surgery

## 2017-08-27 ENCOUNTER — Encounter
Admission: RE | Disposition: A | Discharge status: Home / Self Care | Payer: Self-pay | Source: Ambulatory Visit | Attending: General Surgery

## 2017-08-27 DIAGNOSIS — I10 Essential (primary) hypertension: Secondary | ICD-10-CM | POA: Diagnosis present

## 2017-08-27 DIAGNOSIS — K5090 Crohn's disease, unspecified, without complications: Secondary | ICD-10-CM | POA: Diagnosis present

## 2017-08-27 DIAGNOSIS — C186 Malignant neoplasm of descending colon: Secondary | ICD-10-CM | POA: Diagnosis not present

## 2017-08-27 DIAGNOSIS — C189 Malignant neoplasm of colon, unspecified: Secondary | ICD-10-CM | POA: Diagnosis not present

## 2017-08-27 DIAGNOSIS — K660 Peritoneal adhesions (postprocedural) (postinfection): Secondary | ICD-10-CM | POA: Diagnosis present

## 2017-08-27 DIAGNOSIS — Z886 Allergy status to analgesic agent status: Secondary | ICD-10-CM | POA: Diagnosis not present

## 2017-08-27 DIAGNOSIS — C184 Malignant neoplasm of transverse colon: Secondary | ICD-10-CM

## 2017-08-27 DIAGNOSIS — C182 Malignant neoplasm of ascending colon: Principal | ICD-10-CM | POA: Diagnosis present

## 2017-08-27 DIAGNOSIS — K439 Ventral hernia without obstruction or gangrene: Secondary | ICD-10-CM | POA: Diagnosis present

## 2017-08-27 DIAGNOSIS — Q438 Other specified congenital malformations of intestine: Secondary | ICD-10-CM

## 2017-08-27 HISTORY — PX: LAPAROSCOPIC RIGHT COLECTOMY: SHX5925

## 2017-08-27 LAB — TYPE AND SCREEN
ABO/RH(D): A POS
ANTIBODY SCREEN: NEGATIVE

## 2017-08-27 LAB — GLUCOSE, CAPILLARY: Glucose-Capillary: 139 mg/dL — ABNORMAL HIGH (ref 65–99)

## 2017-08-27 SURGERY — COLECTOMY, RIGHT, LAPAROSCOPIC
Anesthesia: General | Laterality: Right

## 2017-08-27 MED ORDER — BUPROPION HCL ER (SR) 150 MG PO TB12
150.0000 mg | ORAL_TABLET | Freq: Two times a day (BID) | ORAL | Status: DC
  Administered 2017-08-27 – 2017-08-30 (×7): 150 mg via ORAL
  Filled 2017-08-27 (×8): qty 1

## 2017-08-27 MED ORDER — ACETAMINOPHEN 10 MG/ML IV SOLN
INTRAVENOUS | Status: DC | PRN
  Administered 2017-08-27: 1000 mg via INTRAVENOUS

## 2017-08-27 MED ORDER — KETOROLAC TROMETHAMINE 30 MG/ML IJ SOLN
INTRAMUSCULAR | Status: AC
  Filled 2017-08-27: qty 1

## 2017-08-27 MED ORDER — ALBUTEROL SULFATE (2.5 MG/3ML) 0.083% IN NEBU: 3.0000 mL | INHALATION_SOLUTION | Freq: Four times a day (QID) | RESPIRATORY_TRACT | Status: DC | PRN

## 2017-08-27 MED ORDER — FENTANYL CITRATE (PF) 250 MCG/5ML IJ SOLN
INTRAMUSCULAR | Status: AC
  Filled 2017-08-27: qty 5

## 2017-08-27 MED ORDER — MERCAPTOPURINE 50 MG PO TABS
75.0000 mg | ORAL_TABLET | Freq: Every day | ORAL | Status: DC
  Administered 2017-08-28 – 2017-08-31 (×4): 75 mg via ORAL
  Filled 2017-08-27 (×4): qty 2

## 2017-08-27 MED ORDER — FENTANYL CITRATE (PF) 100 MCG/2ML IJ SOLN
INTRAMUSCULAR | Status: AC
  Administered 2017-08-27: 25 ug via INTRAVENOUS
  Filled 2017-08-27: qty 2

## 2017-08-27 MED ORDER — ONDANSETRON HCL 4 MG/2ML IJ SOLN
INTRAMUSCULAR | Status: DC | PRN
  Administered 2017-08-27: 4 mg via INTRAVENOUS

## 2017-08-27 MED ORDER — PHENYLEPHRINE HCL 10 MG/ML IJ SOLN
INTRAMUSCULAR | Status: DC | PRN
  Administered 2017-08-27: 200 ug via INTRAVENOUS
  Administered 2017-08-27 (×3): 100 ug via INTRAVENOUS

## 2017-08-27 MED ORDER — FENTANYL CITRATE (PF) 100 MCG/2ML IJ SOLN
INTRAMUSCULAR | Status: DC | PRN
  Administered 2017-08-27: 50 ug via INTRAVENOUS
  Administered 2017-08-27 (×2): 100 ug via INTRAVENOUS

## 2017-08-27 MED ORDER — LIDOCAINE HCL (CARDIAC) 20 MG/ML IV SOLN
INTRAVENOUS | Status: DC | PRN
  Administered 2017-08-27: 50 mg via INTRAVENOUS

## 2017-08-27 MED ORDER — GABAPENTIN 300 MG PO CAPS
ORAL_CAPSULE | ORAL | Status: AC
  Filled 2017-08-27: qty 1

## 2017-08-27 MED ORDER — LACTATED RINGERS IV SOLN
INTRAVENOUS | Status: DC
  Administered 2017-08-27 – 2017-08-29 (×2): via INTRAVENOUS

## 2017-08-27 MED ORDER — ACETAMINOPHEN 10 MG/ML IV SOLN
INTRAVENOUS | Status: AC
  Filled 2017-08-27: qty 100

## 2017-08-27 MED ORDER — BUPIVACAINE-EPINEPHRINE (PF) 0.5% -1:200000 IJ SOLN
INTRAMUSCULAR | Status: AC
  Filled 2017-08-27: qty 30

## 2017-08-27 MED ORDER — LIDOCAINE HCL (PF) 2 % IJ SOLN
INTRAMUSCULAR | Status: AC
  Filled 2017-08-27: qty 10

## 2017-08-27 MED ORDER — MEPERIDINE HCL 50 MG/ML IJ SOLN: 6.2500 mg | INTRAMUSCULAR | Status: DC | PRN

## 2017-08-27 MED ORDER — CELECOXIB 200 MG PO CAPS
ORAL_CAPSULE | ORAL | Status: AC
  Administered 2017-08-27: 200 mg via ORAL
  Filled 2017-08-27: qty 1

## 2017-08-27 MED ORDER — OXYCODONE HCL 5 MG PO TABS
5.0000 mg | ORAL_TABLET | ORAL | Status: DC | PRN
  Administered 2017-08-29: 5 mg via ORAL
  Filled 2017-08-27: qty 1

## 2017-08-27 MED ORDER — ALVIMOPAN 12 MG PO CAPS
12.0000 mg | ORAL_CAPSULE | Freq: Two times a day (BID) | ORAL | Status: DC
  Administered 2017-08-27 – 2017-08-29 (×4): 12 mg via ORAL
  Filled 2017-08-27 (×5): qty 1

## 2017-08-27 MED ORDER — LEVOTHYROXINE SODIUM 88 MCG PO TABS
88.0000 ug | ORAL_TABLET | Freq: Every day | ORAL | Status: DC
  Administered 2017-08-28 – 2017-08-31 (×4): 88 ug via ORAL
  Filled 2017-08-27 (×4): qty 1

## 2017-08-27 MED ORDER — FENTANYL CITRATE (PF) 100 MCG/2ML IJ SOLN
25.0000 ug | INTRAMUSCULAR | Status: AC | PRN
  Administered 2017-08-27 (×6): 25 ug via INTRAVENOUS

## 2017-08-27 MED ORDER — ENOXAPARIN SODIUM 40 MG/0.4ML ~~LOC~~ SOLN
40.0000 mg | SUBCUTANEOUS | Status: DC
  Administered 2017-08-28 – 2017-08-29 (×2): 40 mg via SUBCUTANEOUS
  Filled 2017-08-27 (×2): qty 0.4

## 2017-08-27 MED ORDER — ALVIMOPAN 12 MG PO CAPS
ORAL_CAPSULE | ORAL | Status: AC
  Administered 2017-08-27: 12 mg via ORAL
  Filled 2017-08-27: qty 1

## 2017-08-27 MED ORDER — SUGAMMADEX SODIUM 200 MG/2ML IV SOLN
INTRAVENOUS | Status: DC | PRN
  Administered 2017-08-27: 140 mg via INTRAVENOUS

## 2017-08-27 MED ORDER — ROCURONIUM BROMIDE 50 MG/5ML IV SOLN
INTRAVENOUS | Status: AC
  Filled 2017-08-27: qty 1

## 2017-08-27 MED ORDER — ONDANSETRON HCL 4 MG/2ML IJ SOLN
INTRAMUSCULAR | Status: AC
  Filled 2017-08-27: qty 2

## 2017-08-27 MED ORDER — LISINOPRIL 10 MG PO TABS
5.0000 mg | ORAL_TABLET | Freq: Every day | ORAL | Status: DC
  Administered 2017-08-28 – 2017-08-30 (×3): 5 mg via ORAL
  Filled 2017-08-27 (×3): qty 1

## 2017-08-27 MED ORDER — BUPIVACAINE-EPINEPHRINE (PF) 0.5% -1:200000 IJ SOLN
INTRAMUSCULAR | Status: DC | PRN
  Administered 2017-08-27: 30 mL

## 2017-08-27 MED ORDER — OXYCODONE HCL 5 MG/5ML PO SOLN: 5.0000 mg | Freq: Once | ORAL | Status: DC | PRN

## 2017-08-27 MED ORDER — CELECOXIB 200 MG PO CAPS
200.0000 mg | ORAL_CAPSULE | ORAL | Status: AC
  Administered 2017-08-27: 200 mg via ORAL

## 2017-08-27 MED ORDER — ASPIRIN EC 81 MG PO TBEC
81.0000 mg | DELAYED_RELEASE_TABLET | Freq: Every day | ORAL | Status: DC
  Administered 2017-08-28 – 2017-08-30 (×3): 81 mg via ORAL
  Filled 2017-08-27 (×3): qty 1

## 2017-08-27 MED ORDER — MIDAZOLAM HCL 2 MG/2ML IJ SOLN
INTRAMUSCULAR | Status: DC | PRN
  Administered 2017-08-27: 2 mg via INTRAVENOUS

## 2017-08-27 MED ORDER — ACYCLOVIR 400 MG PO TABS
400.0000 mg | ORAL_TABLET | Freq: Every day | ORAL | Status: DC
  Administered 2017-08-28: 400 mg via ORAL
  Filled 2017-08-27: qty 1

## 2017-08-27 MED ORDER — PROMETHAZINE HCL 25 MG/ML IJ SOLN
6.2500 mg | INTRAMUSCULAR | Status: DC | PRN
  Filled 2017-08-27: qty 1

## 2017-08-27 MED ORDER — PROPOFOL 10 MG/ML IV BOLUS
INTRAVENOUS | Status: DC | PRN
  Administered 2017-08-27: 130 mg via INTRAVENOUS

## 2017-08-27 MED ORDER — PROPOFOL 10 MG/ML IV BOLUS
INTRAVENOUS | Status: AC
  Filled 2017-08-27: qty 20

## 2017-08-27 MED ORDER — GABAPENTIN 300 MG PO CAPS: 300.0000 mg | ORAL_CAPSULE | ORAL | Status: DC

## 2017-08-27 MED ORDER — ACETAMINOPHEN 10 MG/ML IV SOLN
1000.0000 mg | Freq: Four times a day (QID) | INTRAVENOUS | Status: AC
  Administered 2017-08-27 – 2017-08-28 (×4): 1000 mg via INTRAVENOUS
  Filled 2017-08-27 (×4): qty 100

## 2017-08-27 MED ORDER — DEXAMETHASONE SODIUM PHOSPHATE 10 MG/ML IJ SOLN
INTRAMUSCULAR | Status: DC | PRN
  Administered 2017-08-27: 10 mg via INTRAVENOUS

## 2017-08-27 MED ORDER — PANTOPRAZOLE SODIUM 40 MG PO TBEC
40.0000 mg | DELAYED_RELEASE_TABLET | Freq: Every day | ORAL | Status: DC
  Administered 2017-08-28 – 2017-08-30 (×3): 40 mg via ORAL
  Filled 2017-08-27 (×3): qty 1

## 2017-08-27 MED ORDER — SEVOFLURANE IN SOLN
RESPIRATORY_TRACT | Status: AC
  Filled 2017-08-27: qty 250

## 2017-08-27 MED ORDER — DEXAMETHASONE SODIUM PHOSPHATE 10 MG/ML IJ SOLN
INTRAMUSCULAR | Status: AC
  Filled 2017-08-27: qty 1

## 2017-08-27 MED ORDER — MIDAZOLAM HCL 2 MG/2ML IJ SOLN
INTRAMUSCULAR | Status: AC
  Filled 2017-08-27: qty 2

## 2017-08-27 MED ORDER — PROMETHAZINE HCL 25 MG/ML IJ SOLN: 6.2500 mg | INTRAMUSCULAR | Status: DC | PRN

## 2017-08-27 MED ORDER — ROCURONIUM BROMIDE 100 MG/10ML IV SOLN
INTRAVENOUS | Status: DC | PRN
  Administered 2017-08-27: 20 mg via INTRAVENOUS
  Administered 2017-08-27: 50 mg via INTRAVENOUS

## 2017-08-27 MED ORDER — ALVIMOPAN 12 MG PO CAPS
12.0000 mg | ORAL_CAPSULE | ORAL | Status: AC
  Administered 2017-08-27: 12 mg via ORAL

## 2017-08-27 MED ORDER — LACTATED RINGERS IV SOLN
INTRAVENOUS | Status: DC
  Administered 2017-08-27: 15:00:00 via INTRAVENOUS

## 2017-08-27 MED ORDER — SUGAMMADEX SODIUM 200 MG/2ML IV SOLN
INTRAVENOUS | Status: AC
  Filled 2017-08-27: qty 2

## 2017-08-27 MED ORDER — MORPHINE SULFATE (PF) 2 MG/ML IV SOLN
2.0000 mg | INTRAVENOUS | Status: DC | PRN
  Administered 2017-08-27: 2 mg via INTRAVENOUS
  Filled 2017-08-27: qty 1

## 2017-08-27 MED ORDER — OXYCODONE HCL 5 MG PO TABS: 5.0000 mg | ORAL_TABLET | Freq: Once | ORAL | Status: DC | PRN

## 2017-08-27 MED ORDER — MELATONIN 5 MG PO TABS
5.0000 mg | ORAL_TABLET | Freq: Every evening | ORAL | Status: DC | PRN
  Filled 2017-08-27: qty 1

## 2017-08-27 SURGICAL SUPPLY — 70 items
APPLIER CLIP ROT 10 11.4 M/L (STAPLE)
BLADE SURG 10 STRL SS SAFETY (BLADE) ×2 IMPLANT
BLADE SURG 11 STRL SS SAFETY (MISCELLANEOUS) ×2 IMPLANT
CANISTER SUCT 1200ML W/VALVE (MISCELLANEOUS) ×2 IMPLANT
CANNULA DILATOR 10 W/SLV (CANNULA) ×2 IMPLANT
CHLORAPREP W/TINT 26ML (MISCELLANEOUS) ×2 IMPLANT
CLIP APPLIE ROT 10 11.4 M/L (STAPLE) IMPLANT
COVER CLAMP SIL LG PBX B (MISCELLANEOUS) IMPLANT
DEVICE HAND ACCESS DEXTUS (MISCELLANEOUS) IMPLANT
DRAPE LAP W/FLUID (DRAPES) IMPLANT
DRAPE UNDER BUTTOCK W/FLU (DRAPES) IMPLANT
DRSG OPSITE POSTOP 4X10 (GAUZE/BANDAGES/DRESSINGS) ×2 IMPLANT
DRSG OPSITE POSTOP 4X8 (GAUZE/BANDAGES/DRESSINGS) ×2 IMPLANT
DRSG TEGADERM 2-3/8X2-3/4 SM (GAUZE/BANDAGES/DRESSINGS) ×6 IMPLANT
DRSG TEGADERM 4X4.75 (GAUZE/BANDAGES/DRESSINGS) ×2 IMPLANT
DRSG TELFA 3X8 NADH (GAUZE/BANDAGES/DRESSINGS) ×2 IMPLANT
ELECT BLADE 6.5 EXT (BLADE) ×2 IMPLANT
ELECT CAUTERY BLADE 6.4 (BLADE) ×2 IMPLANT
ELECT REM PT RETURN 9FT ADLT (ELECTROSURGICAL) ×2
ELECTRODE REM PT RTRN 9FT ADLT (ELECTROSURGICAL) ×1 IMPLANT
FILTER LAP SMOKE EVAC STRL (MISCELLANEOUS) IMPLANT
GLOVE BIO SURGEON STRL SZ7 (GLOVE) ×6 IMPLANT
GLOVE BIO SURGEON STRL SZ7.5 (GLOVE) ×6 IMPLANT
GLOVE INDICATOR 8.0 STRL GRN (GLOVE) ×4 IMPLANT
GOWN STRL REUS W/ TWL LRG LVL3 (GOWN DISPOSABLE) ×6 IMPLANT
GOWN STRL REUS W/TWL LRG LVL3 (GOWN DISPOSABLE) ×6
HANDLE YANKAUER SUCT BULB TIP (MISCELLANEOUS) ×2 IMPLANT
HOLDER FOLEY CATH W/STRAP (MISCELLANEOUS) IMPLANT
IRRIGATION STRYKERFLOW (MISCELLANEOUS) IMPLANT
IRRIGATOR STRYKERFLOW (MISCELLANEOUS)
IV LACTATED RINGERS 1000ML (IV SOLUTION) ×2 IMPLANT
KIT PINK PAD W/HEAD ARE REST (MISCELLANEOUS) ×2
KIT PINK PAD W/HEAD ARM REST (MISCELLANEOUS) ×1 IMPLANT
KIT TURNOVER KIT A (KITS) ×2 IMPLANT
LABEL OR SOLS (LABEL) ×2 IMPLANT
LIGASURE LAP MARYLAND 5MM 37CM (ELECTROSURGICAL) ×2 IMPLANT
NDL INSUFF ACCESS 14 VERSASTEP (NEEDLE) ×2 IMPLANT
NS IRRIG 500ML POUR BTL (IV SOLUTION) ×2 IMPLANT
PACK COLON CLEAN CLOSURE (MISCELLANEOUS) ×2 IMPLANT
PACK LAP CHOLECYSTECTOMY (MISCELLANEOUS) ×2 IMPLANT
PAD PREP 24X41 OB/GYN DISP (PERSONAL CARE ITEMS) ×2 IMPLANT
PENCIL ELECTRO HAND CTR (MISCELLANEOUS) ×2 IMPLANT
PROT DEXTUS HAND ACCESS (MISCELLANEOUS)
RELOAD PROXIMATE 75MM BLUE (ENDOMECHANICALS) ×2 IMPLANT
RETAINER VISCERA MED (MISCELLANEOUS) IMPLANT
RETRACTOR FIXED LENGTH SML (MISCELLANEOUS) IMPLANT
RETRACTOR WOUND ALXS 18CM MED (MISCELLANEOUS) ×1 IMPLANT
RTRCTR WOUND ALEXIS O 18CM MED (MISCELLANEOUS) ×2
SCISSORS METZENBAUM CVD 33 (INSTRUMENTS) ×2 IMPLANT
SET YANKAUER POOLE SUCT (MISCELLANEOUS) ×2 IMPLANT
SHEARS HARMONIC ACE PLUS 36CM (ENDOMECHANICALS) IMPLANT
SPONGE LAP 18X18 5 PK (GAUZE/BANDAGES/DRESSINGS) ×6 IMPLANT
STAPLER PROXIMATE 75MM BLUE (STAPLE) ×2 IMPLANT
STRIP CLOSURE SKIN 1/2X4 (GAUZE/BANDAGES/DRESSINGS) ×2 IMPLANT
SUT MAXON ABS #0 GS21 30IN (SUTURE) ×8 IMPLANT
SUT SILK 2 0 (SUTURE) ×1
SUT SILK 2-0 30XBRD TIE 12 (SUTURE) ×1 IMPLANT
SUT SILK 3-0 (SUTURE) ×4 IMPLANT
SUT VIC AB 2-0 BRD 54 (SUTURE) ×4 IMPLANT
SUT VIC AB 2-0 CT1 27 (SUTURE) ×2
SUT VIC AB 2-0 CT1 TAPERPNT 27 (SUTURE) ×2 IMPLANT
SUT VIC AB 3-0 54X BRD REEL (SUTURE) ×2 IMPLANT
SUT VIC AB 3-0 BRD 54 (SUTURE) ×2
SUT VIC AB 3-0 SH 27 (SUTURE) ×2
SUT VIC AB 3-0 SH 27X BRD (SUTURE) ×2 IMPLANT
SUT VIC AB 4-0 FS2 27 (SUTURE) ×4 IMPLANT
TRAY FOLEY W/METER SILVER 16FR (SET/KITS/TRAYS/PACK) IMPLANT
TROCAR XCEL NON-BLD 11X100MML (ENDOMECHANICALS) ×2 IMPLANT
TROCAR XCEL UNIV SLVE 11M 100M (ENDOMECHANICALS) ×4 IMPLANT
TUBING INSUF HEATED (TUBING) ×2 IMPLANT

## 2017-08-27 NOTE — Anesthesia Preprocedure Evaluation (Signed)
Anesthesia Evaluation  Patient identified by MRN, date of birth, ID band Patient awake    Reviewed: Allergy & Precautions, NPO status , Patient's Chart, lab work & pertinent test results  History of Anesthesia Complications Negative for: history of anesthetic complications  Airway Mallampati: I  TM Distance: >3 FB Neck ROM: Full    Dental no notable dental hx.    Pulmonary neg sleep apnea, COPD,  COPD inhaler, former smoker,    breath sounds clear to auscultation- rhonchi (-) wheezing      Cardiovascular Exercise Tolerance: Good hypertension, Pt. on medications (-) CAD, (-) Past MI, (-) Cardiac Stents and (-) CABG  Rhythm:Regular Rate:Normal - Systolic murmurs and - Diastolic murmurs    Neuro/Psych negative neurological ROS  negative psych ROS   GI/Hepatic negative GI ROS, Neg liver ROS,   Endo/Other  neg diabetesHypothyroidism   Renal/GU Renal disease: hx of nephrolithiasis.     Musculoskeletal  (+) Arthritis ,   Abdominal (+) - obese,   Peds  Hematology negative hematology ROS (+)   Anesthesia Other Findings Past Medical History: No date: Arthritis No date: Cancer [facility])     Comment:  face No date: COPD (chronic obstructive pulmonary disease) ([place]) No date: Crohn's disease ([place])     Comment:  1989 ? No date: Dyspnea on exertion No date: Genital herpes No date: H/O pneumothorax     Comment:  x 2 No date: History of kidney stones No date: Hypertension No date: Hypothyroid No date: Kidney stones No date: Osteopenia 10/23/2015: Personal history of tobacco use, presenting hazards to  health No date: Scoliosis No date: Spontaneous pneumothorax No date: Vertigo     Comment:  last episode 11/14/15 No date: Volvulus ([place])   Reproductive/Obstetrics                             Anesthesia Physical Anesthesia Plan  ASA: II  Anesthesia Plan: General   Post-op Pain Management:     Induction: Intravenous  PONV Risk Score and Plan: 2 and Ondansetron, Dexamethasone and Midazolam  Airway Management Planned: Oral ETT  Additional Equipment:   Intra-op Plan:   Post-operative Plan: Extubation in OR  Informed Consent: I have reviewed the patients History and Physical, chart, labs and discussed the procedure including the risks, benefits and alternatives for the proposed anesthesia with the patient or authorized representative who has indicated his/her understanding and acceptance.   Dental advisory given  Plan Discussed with: CRNA and Anesthesiologist  Anesthesia Plan Comments:         Anesthesia Quick Evaluation

## 2017-08-27 NOTE — Anesthesia Postprocedure Evaluation (Signed)
Anesthesia Post Note  Patient: Joanna Rodriguez  Procedure(s) Performed: LAPAROSCOPIC RIGHT COLECTOMY (Right )  Patient location during evaluation: PACU Anesthesia Type: General Level of consciousness: awake and alert Pain management: pain level controlled Vital Signs Assessment: post-procedure vital signs reviewed and stable Respiratory status: spontaneous breathing, nonlabored ventilation, respiratory function stable and patient connected to nasal cannula oxygen Cardiovascular status: blood pressure returned to baseline and stable Postop Assessment: no apparent nausea or vomiting Anesthetic complications: no     Last Vitals:  Vitals:   08/27/17 2019 08/27/17 2103  BP: (!) 126/54 120/66  Pulse: 84 91  Resp: 16 16  Temp: 37 C 36.6 C  SpO2: 98% 97%    Last Pain:  Vitals:   08/27/17 2103  TempSrc: Oral  PainSc:                  [name]

## 2017-08-27 NOTE — OR Nursing (Signed)
Per dr byrnett it is ok to give patient celebrex for this one dose. Patient informed of doctors decision. Medication given

## 2017-08-27 NOTE — H&P (Signed)
No change in history or clinical exam since earlier this week. Tolerated prep well. For right colectomy.

## 2017-08-27 NOTE — Op Note (Signed)
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  Postoperative diagnosis: Same, extensive intra-abdominal adhesions.  Ventral hernia.Marland Kitchen  Operative procedure: Laparoscopy, lysis of adhesions, laparoscopically assisted right hemicolectomy with ileotransverse colostomy.  Operating Surgeon: Hervey Ard, MD.  Assistant: Arvilla Meres, RN, FA  Anesthesia: General endotracheal.  Estimated blood loss: 25 cc.  Clinical note: This 67 year old woman underwent a screening colonoscopy secondary to a family history of colon cancer in her father.  She was found to have a small polyp near the hepatic flexure and this showed at minimum intramucosal adenocarcinoma.  She was felt to be a candidate for right hemicolectomy.  The area had been tattooed by the endoscopist.  The patient has a past history of previous laparotomy and small bowel resection.  Operative note: The patient underwent a formal bowel prep and had oral and IV antibiotics.  SCD stockings for DVT prevention.  After the induction of general endotracheal anesthesia the abdomen was prepped with ChloraPrep and draped.  Left arm tucked.  Based on her previous surgeries a vertical 3 cm incision above the umbilicus was made and carried down to skin subtendinous tissue.  The fascia was opened and with blunt dissection the peritoneum entered.  A Hassan cannula was placed and with Jockey in the 0 degree scope could be entered through a window in the filmy adhesions at this level.  Once this was done a second port, an 11 mm XL could be placed in the left lower quadrant and finally a similar 11 mm XL port in the left upper quadrant.  The midline port was anchored into position with a 2-0 Prolene baseball stitch suture.  Pressure for pneumoperitoneum was 10 mmHg.  There were extensive adhesions to the anterior abdominal wall and a "Swiss cheese" appearance to the fascia.  The midline adhesions were taken down with the LigaSure device.   The omentum was divided and the hepatic flexure taken down.  The white line of Toldt was divided and the cecum freed.  The duodenum was visualized at the base of the transverse colon mesentery.  Once the entire right colon could be brought to the left of midline the abdomen was desufflated and ports removed.  A an 8 cm incision was made above the umbilicus.  An Alexis wound retractor was placed.  The right colon was easily brought into the wound.  The middle colic vessel was identified and the dissection carried along the mesentery lateral to this vessel.  The mesentery was divided with the LigaSure device except for the right colic vessel which was controlled with 2-0 silk ties.  A side-to-side functional end-to-end anastomosis was created with interrupted 3-0 silk sutures to approximate the borders of the mid transverse colon and the distal small bowel.  Of note there was some mesenteric thickening consistent with the patient's history of Crohn's disease, although no clear "fat wrapping".  Enterotomies were made in both bowel loops and the GIA 75 mm stapler was fired.  Good hemostasis was noted.  A second application at 90 degrees completed the anastomosis.  The ends were inverted with 3-0 silk figure-of-eight sutures.  The crotch was reinforced in a similar fashion.  The mesentery was closed with a running 3-0 Vicryl suture.  The abdomen was irrigated and inspection of the right gutter showed good hemostasis.  The area of the old small bowel anastomosis was identified and was unremarkable.  A few filmy adhesions of the small bowel were divided to provide good exposure of the terminal ileum.  The intra-abdominal exam was otherwise unremarkable.  Prior to skin incision 30 cc of 0.5% Marcaine with 1-200,000 units of epinephrine was infiltrated about 3 cm from the midline wound and at the port sites for postoperative analgesia.  The midline fascia was approximated with interrupted 0 Maxon figure-of-eight sutures.   Wound was irrigated and the adipose layer approximated with a running 2-0 Vicryl suture.  Skin incisions were closed with 4-0 Vicryl subarticular sutures.  Benzoin, Steri-Strips followed by Telfa and Tegaderm dressings at the port sites and a honeycomb dressing at the both: Extraction site were applied.  The patient tolerated the procedure well and was taken to recovery room in stable condition.

## 2017-08-27 NOTE — Anesthesia Procedure Notes (Signed)
Procedure Name: Intubation Date/Time: 08/27/2017 3:10 PM Performed by: Jonna Clark, CRNA Pre-anesthesia Checklist: Patient identified, Patient being monitored, Timeout performed, Emergency Drugs available and Suction available Patient Re-evaluated:Patient Re-evaluated prior to induction Oxygen Delivery Method: Circle system utilized Preoxygenation: Pre-oxygenation with 100% oxygen Induction Type: IV induction Ventilation: Mask ventilation without difficulty Laryngoscope Size: Mac and 3 Grade View: Grade I Tube type: Oral Tube size: 7.0 mm Number of attempts: 1 Airway Equipment and Method: Stylet and LTA kit utilized Placement Confirmation: ETT inserted through vocal cords under direct vision,  positive ETCO2 and breath sounds checked- equal and bilateral Secured at: 21 cm Tube secured with: Tape Dental Injury: Teeth and Oropharynx as per pre-operative assessment

## 2017-08-27 NOTE — Transfer of Care (Signed)
Immediate Anesthesia Transfer of Care Note  Patient: Joanna Rodriguez  Procedure(s) Performed: LAPAROSCOPIC RIGHT COLECTOMY (Right )  Patient Location: PACU  Anesthesia Type:General  Level of Consciousness: drowsy and patient cooperative  Airway & Oxygen Therapy: Patient Spontanous Breathing and Patient connected to face mask oxygen  Post-op Assessment: Report given to RN and Post -op Vital signs reviewed and stable  Post vital signs: Reviewed and stable  Last Vitals:  Vitals:   08/27/17 1744 08/27/17 1745  BP:  130/65  Pulse:    Resp:    Temp: (!) (P) 36.1 C (!) 36.2 C  SpO2:      Last Pain:  Vitals:   08/27/17 1239  TempSrc: Tympanic         Complications: No apparent anesthesia complications

## 2017-08-27 NOTE — Anesthesia Post-op Follow-up Note (Signed)
Anesthesia QCDR form completed.        

## 2017-08-28 ENCOUNTER — Encounter: Payer: Self-pay | Admitting: General Surgery

## 2017-08-28 MED ORDER — ACETAMINOPHEN 325 MG PO TABS
650.0000 mg | ORAL_TABLET | ORAL | Status: DC
  Administered 2017-08-28 – 2017-08-31 (×12): 650 mg via ORAL
  Filled 2017-08-28 (×12): qty 2

## 2017-08-28 MED ORDER — ACYCLOVIR 200 MG PO CAPS
400.0000 mg | ORAL_CAPSULE | Freq: Every day | ORAL | Status: DC
  Administered 2017-08-29 – 2017-08-30 (×2): 400 mg via ORAL
  Filled 2017-08-28 (×3): qty 2

## 2017-08-28 NOTE — Progress Notes (Signed)
Pt has refused bed alarm due to daughter being in the room and she stated she will help her.

## 2017-08-28 NOTE — Progress Notes (Addendum)
Afebrile. Mild tachycardia. BP stable. Pain with change in position, otherwise comfortable.  Hx of Vertigo since pneumothorax in 2016. Positional by history (bending over, etc). Heartburn overnight. Uses chewable anti-acids at home.  Lungs: Clear. Cardio: RR. ABD: Mild distension, BS+, soft. Wounds: Clean and dry. Extrem: Soft.   Patient had gastric distension noted during surgery, and CRNA unable to pass NG to decompress. Patient denies any nausea, only "reflux". No clear explanation for tachycardia based on exam.  Will hold diet at clear liquids. Need to ambulate reviewed. Will get a walker if needed.  Patient on IV tylenol until 6 PM.  Will start Tylenol, 650 po q4h while awake at bedtime for ongoing pain relief.   Dr. Zachery Dauer covering this weekend.

## 2017-08-29 LAB — BASIC METABOLIC PANEL
Anion gap: 10 (ref 5–15)
BUN: 13 mg/dL (ref 6–20)
CHLORIDE: 103 mmol/L (ref 101–111)
CO2: 26 mmol/L (ref 22–32)
CREATININE: 0.76 mg/dL (ref 0.44–1.00)
Calcium: 8.8 mg/dL — ABNORMAL LOW (ref 8.9–10.3)
GFR calc Af Amer: >60 mL/min (ref >60)
GFR calc non Af Amer: >60 mL/min (ref >60)
Glucose, Bld: 98 mg/dL (ref 65–99)
Potassium: 3.9 mmol/L (ref 3.5–5.1)
SODIUM: 139 mmol/L (ref 135–145)

## 2017-08-29 LAB — CBC WITH DIFFERENTIAL/PLATELET
Basophils Absolute: 0 10*3/uL (ref 0–0.1)
Basophils Relative: 0 %
EOS ABS: 0 10*3/uL (ref 0–0.7)
Eosinophils Relative: 1 %
HCT: 34.3 % — ABNORMAL LOW (ref 35.0–47.0)
HEMOGLOBIN: 11.7 g/dL — AB (ref 12.0–16.0)
LYMPHS ABS: 1 10*3/uL (ref 1.0–3.6)
Lymphocytes Relative: 15 %
MCH: 36 pg — AB (ref 26.0–34.0)
MCHC: 34.2 g/dL (ref 32.0–36.0)
MCV: 105.1 fL — ABNORMAL HIGH (ref 80.0–100.0)
MONOS PCT: 5 %
Monocytes Absolute: 0.3 10*3/uL (ref 0.2–0.9)
NEUTROS PCT: 79 %
Neutro Abs: 5.1 10*3/uL (ref 1.4–6.5)
Platelets: 291 10*3/uL (ref 150–440)
RBC: 3.26 MIL/uL — ABNORMAL LOW (ref 3.80–5.20)
RDW: 16.1 % — ABNORMAL HIGH (ref 11.5–14.5)
WBC: 6.5 10*3/uL (ref 3.6–11.0)

## 2017-08-29 MED ORDER — BENZONATATE 100 MG PO CAPS
100.0000 mg | ORAL_CAPSULE | Freq: Three times a day (TID) | ORAL | Status: DC | PRN
  Administered 2017-08-29 – 2017-08-30 (×4): 100 mg via ORAL
  Filled 2017-08-29 (×4): qty 1

## 2017-08-29 NOTE — Progress Notes (Addendum)
Nurse called to patients room and told that there was blood in the stool. When arriving to room the toilet was full of dark red blood with clots of blood in trash can that visitor had cleaned from floor, the visitor in room stated " there was blood trailing from the bed to the bathroom on the floor, she has had several stools since the surgery, but no bleeding like this". Provider paged to notify awaiting return call. Patient is stable sitting on bed. Dr. Bary Castilla contacted and notified of the above ordered to continue to monitor patient. Lovenox discontinued and Entereg, H& H ordered.

## 2017-08-29 NOTE — Progress Notes (Signed)
Patient ID: Joanna Rodriguez, female   DOB: Jul 30, 1950, 67 y.o.   MRN: [id number]     [facility] Hospital Day(s): 2.   Post op day(s): 2 Days Post-Op.   Interval History: Patient seen and examined, no acute events or new complaints overnight. Patient reports started passing gas last night. She refers still has some pain on the right lower quadrant that improved a little bit when passed the gas. Patient refers was able to walk around the hall more yesterday. Denies nausea or vomiting with liquid intake.   Vital signs in last 24 hours: [min-max] current  Temp:  [98 F (36.7 C)-99.1 F (37.3 C)] 98.4 F (36.9 C) (03/03 0514) Pulse Rate:  [88-94] 91 (03/03 0514) Resp:  [16-20] 18 (03/03 0514) BP: (132-153)/(56-75) 153/75 (03/03 0514) SpO2:  [96 %-97 %] 97 % (03/03 0514) Weight:  [67.7 kg (149 lb 4 oz)] 67.7 kg (149 lb 4 oz) (03/03 0327)     Height: 5' (152.4 cm) Weight: 67.7 kg (149 lb 4 oz) BMI (Calculated): 29.15    Physical Exam:  Constitutional: alert, cooperative and no distress  Respiratory: breathing non-labored at rest  Cardiovascular: regular rate and sinus rhythm  Gastrointestinal: soft, non-tender, and non-distended. Wounds are dry and clean.   Labs:  CBC Latest Ref Rng & Units 08/29/2017 08/26/2017 05/27/2017  WBC 3.6 - 11.0 K/uL 6.5 5.0 5.1  Hemoglobin 12.0 - 16.0 g/dL 11.7(L) 12.2 12.9  Hematocrit 35.0 - 47.0 % 34.3(L) 36.1 38.3  Platelets 150 - 440 K/uL 291 391 354   CMP Latest Ref Rng & Units 08/29/2017 08/26/2017 05/27/2017  Glucose 65 - 99 mg/dL 98 88 -  BUN 6 - 20 mg/dL 13 19 -  Creatinine 0.44 - 1.00 mg/dL 0.76 0.91 -  Sodium 135 - 145 mmol/L 139 139 -  Potassium 3.5 - 5.1 mmol/L 3.9 4.3 -  Chloride 101 - 111 mmol/L 103 103 -  CO2 22 - 32 mmol/L 26 27 -  Calcium 8.9 - 10.3 mg/dL 8.8(L) 9.2 -  Total Protein 6.0 - 8.5 g/dL - - 6.2  Total Bilirubin 0.0 - 1.2 mg/dL - - 0.3  Alkaline Phos 39 - 117 IU/L - - 70  AST 0 - 40 IU/L - - 29  ALT 0 - 32  IU/L - - 31   Assessment/Plan:  67 y.o. female with malignancy of the ascending  And proximal transverse colon 2 Days Post-Op s/p laparoscopic assisted extended right hemicolectomy.  Patient found today walking in the room with mild-moderate pain on the right lower quadrant. Pain is improved with the oxycodone that let her sleep but is trying to avoid it. The heart rate improved, now constantly below 100's. Will progress the diet. Patient encouraged to continue ambulating. Adequate hemoglobin and WBC counts. No electrolyte disturbance. Adequate urine output, voiding spontaneously.    - Continue DVT prophylaxis   - Continue ambulating   - Progress diet and assess for toleration  - Pain control   All of the above findings and recommendations were discussed with the patient, patient's family, and the medical team, and all of patient's and family's questions were answered to their expressed satisfaction.  Arnold Long, MD

## 2017-08-30 LAB — GLUCOSE, CAPILLARY: GLUCOSE-CAPILLARY: 88 mg/dL (ref 65–99)

## 2017-08-30 LAB — HEMOGLOBIN AND HEMATOCRIT, BLOOD
HEMATOCRIT: 26.1 % — AB (ref 35.0–47.0)
Hemoglobin: 9 g/dL — ABNORMAL LOW (ref 12.0–16.0)

## 2017-08-30 MED ORDER — SODIUM CHLORIDE 0.9% FLUSH
3.0000 mL | Freq: Two times a day (BID) | INTRAVENOUS | Status: DC
  Administered 2017-08-30 (×2): 3 mL via INTRAVENOUS

## 2017-08-30 MED ORDER — SODIUM CHLORIDE 0.9% FLUSH: 3.0000 mL | INTRAVENOUS | Status: DC | PRN

## 2017-08-30 MED ORDER — SODIUM CHLORIDE 0.9 % IV SOLN: 250.0000 mL | INTRAVENOUS | Status: DC | PRN

## 2017-08-30 NOTE — Plan of Care (Signed)
Patient is ambulating in hallway and tolerating clear liquid diet.   Resting comfortably with no complaints of pain.  Joanna Rodriguez

## 2017-08-30 NOTE — Progress Notes (Addendum)
Patient called nurse in the room shortly after Dr. Bary Castilla left from speaking with her and stated " there's more blood". Upon entering bathroom noted there was tar colored blood in hat and in toilet with a large clot in hat. Contacted Dr. Bary Castilla and notified him of the above. Per Dr. Bary Castilla no new orders, patient needs to continue walking and not holding coughs in, continue to monitor H&H. Hgb has dropped from 11.7 to 9.0 from 3/3 to today.

## 2017-08-30 NOTE — Care Management Important Message (Signed)
Important Message  Patient Details  Name: Joanna Rodriguez MRN: [id number] Date of Birth: [birth year]   Medicare Important Message Given:  Yes    Beverly Sessions, RN 08/30/2017, 2:59 PM

## 2017-08-31 LAB — HEMOGLOBIN AND HEMATOCRIT, BLOOD
HCT: 23.7 % — ABNORMAL LOW (ref 35.0–47.0)
Hemoglobin: 7.9 g/dL — ABNORMAL LOW (ref 12.0–16.0)

## 2017-08-31 NOTE — Progress Notes (Signed)
Discharge instructions reviewed with patient and patient's sister with patient's permission. Patient verbalized understanding of instructions and all questions answered at this time. Patient reminded of follow up appointment on March 14th. No rx given to patient at this time. Patient states that she already has a prescription for tramadol. Patient reminded of sedative drug warning and not to drink alcohol, drive or operate heavy machinery while taking this medication. Patient also verbalized understanding. Patient to be escorted to vehicle via wheelchair with sister driving.

## 2017-08-31 NOTE — Final Progress Note (Signed)
AVSS. Feeling well. No narcotics for 24 hours. BM's back to normal, soft, pudding. Scant spots of blood. No symptoms of SOB/CP when ambulating. Lungs: Clear. Cardio: RR. ABD: Mild tenderness along the right lateral abdomen. Soft. Wounds: Clean. Extrem: Soft. HGB: Mild fall from 9.0 to 7.9.  Previously fell from 11.7 to 9.0.   IMP: Anastomotic bleed, resolved. Doing well with diet and ambulation. Plan: Home today.

## 2017-09-01 ENCOUNTER — Telehealth: Payer: Self-pay | Admitting: General Surgery

## 2017-09-01 LAB — SURGICAL PATHOLOGY

## 2017-09-01 NOTE — Telephone Encounter (Signed)
The patient was notified that results from her right colectomy completed on August 27, 2017 is been reviewed.  This was a 7 mm tumor, T2, 0/17 nodes positive.  She reports that she has had some heartburn and gas since return home.  She reports bowel movements are still loose but brown in color.  No further bleeding.  2 episodes of mild nausea but no vomiting.  Not much appetite at this time, but she is keeping her fluids up.  We will plan to get together next week as planned, earlier if need be.

## 2017-09-02 ENCOUNTER — Encounter: Payer: Self-pay | Admitting: Family Medicine

## 2017-09-02 DIAGNOSIS — C182 Malignant neoplasm of ascending colon: Secondary | ICD-10-CM | POA: Insufficient documentation

## 2017-09-04 NOTE — Discharge Summary (Signed)
Physician Discharge Summary  Patient ID: Joanna Rodriguez MRN: [id number] DOB/AGE: [birth year] 67 y.o.  Admit date: 08/27/2017 Discharge date: 09/04/2017  Admission Diagnoses: Carcinoma of the right colon  Discharge Diagnoses:  Active Problems:   Colon cancer [facility])   Discharged Condition: good  Hospital Course: The patient was admitted the day of surgery.  She underwent a laparoscopically assisted right hemicolectomy.  She had a very redundant right colon."  Reportedly placed in the proximal transverse colon at the time of her colonoscopy was actually in the mid ascending colon.  There was at least 8-10 inches of colon proximal to this area of ink on intraoperative examination.  Her diet was advanced and she tolerated this well.  Ambulated early and often.  No cardiopulmonary events.  Consults: None  Significant Diagnostic Studies: labs: Pathology: DIAGNOSIS:  A. TERMINAL ILEUM AND RIGHT COLON; RIGHT COLECTOMY:  - ADENOCARCINOMA OF THE ASCENDING COLON, WITH INVASION INTO THE  MUSCULARIS PROPRIA.  - NO RESIDUAL CARCINOMA OF THE TRANSVERSE COLON; CHANGES FROM PREVIOUS  POLYPECTOMY ARE PRESENT.  - MARGINS ARE NEGATIVE FOR MALIGNANCY.  - 17 REGIONAL LYMPH NODES NEGATIVE FOR MALIGNANCY (0/17).  Treatments: IV hydration  Discharge Exam: Blood pressure (!) 118/56, pulse 83, temperature 97.9 F (36.6 C), temperature source Oral, resp. rate 20, height 5' (1.524 m), weight 149 lb 4 oz (67.7 kg), last menstrual period 06/03/1972, SpO2 98 %. General appearance: alert and cooperative Resp: clear to auscultation bilaterally Cardio: regular rate and rhythm, S1, S2 normal, no murmur, click, rub or gallop GI: soft, non-tender; bowel sounds normal; no masses,  no organomegaly Incision/Wound: No evident of infection, induration or thickening.  Disposition: 01-Home or Self Care  Discharge Instructions    Diet - low sodium heart healthy   Complete by:  As directed    Discharge instructions    Complete by:  As directed    OK to shower.  No lifting over 10 pounds.  No driving until pain free.  Report any further bloody bowel movements.  Diet as tolerated.  Walk in the house frequently.  Use your incentive spirometer frequently at home.  You may resume your supplements (black cohash, etc) on Sunday.   Increase activity slowly   Complete by:  As directed      Allergies as of 08/31/2017      Reactions   Nsaids    Other reaction(s): Caused Crohn's flare up.      Medication List    STOP taking these medications   Black Cohosh 540 MG Caps   CALCIUM 600+D3 PO   COSAMIN DS PO   metroNIDAZOLE 500 MG tablet Commonly known as:  FLAGYL   neomycin 500 MG tablet Commonly known as:  MYCIFRADIN   polyethylene glycol powder powder Commonly known as:  GLYCOLAX/MIRALAX     TAKE these medications   acyclovir 400 MG tablet Commonly known as:  ZOVIRAX Take 1 tablet (400 mg total) by mouth daily. What changed:  when to take this   albuterol 108 (90 Base) MCG/ACT inhaler Commonly known as:  PROVENTIL HFA;VENTOLIN HFA Inhale 2 puffs into the lungs every 6 (six) hours as needed for wheezing or shortness of breath.   aspirin EC 81 MG tablet Take 81 mg by mouth daily.   buPROPion 150 MG 12 hr tablet Commonly known as:  WELLBUTRIN SR 1 tab daily for 1 week, then 1 tab BID What changed:    how much to take  how to take this  when to take this  additional instructions   levothyroxine 88 MCG tablet Commonly known as:  SYNTHROID, LEVOTHROID Take 1 tablet (88 mcg total) by mouth daily. What changed:    when to take this  additional instructions   lisinopril 5 MG tablet Commonly known as:  PRINIVIL,ZESTRIL Take 1 tablet (5 mg total) by mouth daily.   Melatonin 5 MG Tabs Take 5 mg by mouth at bedtime as needed (for sleep.).   mercaptopurine 50 MG tablet Commonly known as:  PURINETHOL TAKE 1 AND 1/2 TABLETS EVERY DAY What changed:    how much to  take  how to take this  when to take this  additional instructions   multivitamin with minerals tablet Take 1 tablet by mouth daily. One-A-Day for 50+   pantoprazole 40 MG tablet Commonly known as:  PROTONIX TAKE 1 TABLET EVERY DAY What changed:    how much to take  how to take this  when to take this   traMADol 50 MG tablet Commonly known as:  ULTRAM TAKE 1 TABLET THREE TIMES DAILY What changed:    how much to take  how to take this  when to take this   vitamin B-12 1000 MCG tablet Commonly known as:  CYANOCOBALAMIN Take 1,000 mcg by mouth daily.      Follow-up Information    Dinah Lupa, Forest Gleason, MD. Go on 09/09/2017.   Specialties:  General Surgery, Radiology Why:  Thursday at 1:30pm for hospital follow-up Contact information: [place] [address] [phone]           Signed: Robert Bellow 09/04/2017, 11:04 AM

## 2017-09-05 ENCOUNTER — Other Ambulatory Visit: Payer: Self-pay | Admitting: General Surgery

## 2017-09-07 ENCOUNTER — Ambulatory Visit: Payer: Self-pay | Admitting: General Surgery

## 2017-09-09 ENCOUNTER — Encounter: Payer: Self-pay | Admitting: General Surgery

## 2017-09-09 ENCOUNTER — Ambulatory Visit (INDEPENDENT_AMBULATORY_CARE_PROVIDER_SITE_OTHER): Payer: Medicare HMO | Admitting: General Surgery

## 2017-09-09 VITALS — BP 124/76 | HR 76 | Ht 59.0 in | Wt 144.0 lb

## 2017-09-09 DIAGNOSIS — C184 Malignant neoplasm of transverse colon: Secondary | ICD-10-CM

## 2017-09-09 DIAGNOSIS — C182 Malignant neoplasm of ascending colon: Secondary | ICD-10-CM

## 2017-09-09 NOTE — Patient Instructions (Addendum)
Patient to return in one month. The patient is aware to call back for any questions or concerns.

## 2017-09-09 NOTE — Progress Notes (Signed)
Patient ID: Joanna Rodriguez, female   DOB: 1950-11-13, 67 y.o.   MRN: [id number]  Chief Complaint  Patient presents with  . Routine Post Op    HPI Joanna Rodriguez is a 67 y.o. female here today for a post op Lararoscopic right colectomy done on 08/27/2017. Patient states she has a little gas and loose poops today. She has been using a heating pad that's helped.  HPI  Past Medical History:  Diagnosis Date  . Arthritis   . Cancer [facility])    face  . COPD (chronic obstructive pulmonary disease) ([place])   . Crohn's disease ([place])    1989 ?  Marland Kitchen Dyspnea on exertion   . Genital herpes   . H/O pneumothorax    x 2  . History of kidney stones   . Hypertension   . Hypothyroid   . Kidney stones   . Osteopenia   . Personal history of tobacco use, presenting hazards to health 10/23/2015  . Scoliosis   . Spontaneous pneumothorax   . Vertigo    last episode 11/14/15  . Volv[facility])     Past Surgical History:  Procedure Laterality Date  . ABDOMINAL HYSTERECTOMY    . APPENDECTOMY    . BREAST EXCISIONAL BIOPSY Bilateral 1990's   NEG  . CAPSULOTOMY METATARSOPHALANGEAL Left 03/19/2016   Procedure: CAPSULOTOMY METATARSOPHALANGEAL;  Surgeon: Albertine Patricia, DPM;  Location: [place];  Service: Podiatry;  Laterality: Left;  Second left toe.  Marland Kitchen COLONOSCOPY WITH PROPOFOL N/A 08/05/2017   Procedure: COLONOSCOPY WITH PROPOFOL;  Surgeon: Lucilla Lame, MD;  Location: [place];  Service: Endoscopy;  Laterality: N/A;  . ESOPHAGOGASTRODUODENOSCOPY (EGD) WITH PROPOFOL N/A 11/21/2015   Procedure: ESOPHAGOGASTRODUODENOSCOPY (EGD) WITH PROPOFOL with dialation;  Surgeon: Lucilla Lame, MD;  Location: [place];  Service: Endoscopy;  Laterality: N/A;  . FLEXOR TENDON REPAIR Left 03/19/2016   Procedure: FLEXOR TENDON release, percutaneous.;  Surgeon: Albertine Patricia, DPM;  Location: [place];  Service: Podiatry;  Laterality: Left;  4th left toe.  Marland Kitchen HAMMER TOE SURGERY  Left 03/19/2016   Procedure: HAMMER TOE CORRECTION with PIPjoint fusion;  Surgeon: Albertine Patricia, DPM;  Location: [place];  Service: Podiatry;  Laterality: Left;  Third left toe.  Marland Kitchen HAMMER TOE SURGERY Right 08/06/2016   Procedure: HAMMER TOE CORRECTION  RIGHT 2ND AND 3RD;  Surgeon: Albertine Patricia, DPM;  Location: [place];  Service: Podiatry;  Laterality: Right;  LMA with local Special Needs:  [place]  [employer] Needs to be 2nd patient per office  . LAPAROSCOPIC RIGHT COLECTOMY Right 08/27/2017   Procedure: LAPAROSCOPIC RIGHT COLECTOMY;  Surgeon: Robert Bellow, MD;  Location: [facility];  Service: General;  Laterality: Right;  . POLYPECTOMY  08/05/2017   Procedure: POLYPECTOMY;  Surgeon: Lucilla Lame, MD;  Location: [place];  Service: Endoscopy;;  . TALC PLEURODESIS  07/2014   left side  . THORACOSCOPY  07/2014   left side  . TUBAL LIGATION    . [place], with small bowel resection  . WEIL OSTEOTOMY Right 08/06/2016   Procedure: WEIL right 2nd & 3rd;  Surgeon: Albertine Patricia, DPM;  Location: [place];  Service: Podiatry;  Laterality: Right;    Family History  Problem Relation Age of Onset  . Hypertension Mother   . Hypothyroidism Mother   . Alcohol abuse Mother   . Breast cancer Mother 82  . Hyperlipidemia Mother   . Mental illness Mother   .  Heart attack Father   . Heart disease Father   . Glaucoma Father   . Colon cancer Father 73  . Diabetes Father   . Hypertension Father   . Heart disease Sister   . Hyperlipidemia Sister   . Hypertension Sister   . Lung disease Sister     Social History Social History   Tobacco Use  . Smoking status: Former Smoker    Packs/day: 1.50    Years: 53.00    Pack years: 79.50    Types: Cigarettes    Last attempt to quit: 08/22/2014    Years since quitting: 3.0  . Smokeless tobacco: Never Used  . Tobacco comment: NOV 16,2015  Substance Use Topics  . Alcohol use: Yes     Alcohol/week: 0.0 oz    Comment: occasional - 2-3x/yr  . Drug use: No    Allergies  Allergen Reactions  . Nsaids     Other reaction(s): Caused Crohn's flare up.    Current Outpatient Medications  Medication Sig Dispense Refill  . acyclovir (ZOVIRAX) 400 MG tablet Take 1 tablet (400 mg total) by mouth daily. (Patient taking differently: Take 400 mg by mouth daily with lunch. ) 90 tablet 3  . albuterol (PROVENTIL HFA;VENTOLIN HFA) 108 (90 Base) MCG/ACT inhaler Inhale 2 puffs into the lungs every 6 (six) hours as needed for wheezing or shortness of breath. 1 Inhaler 2  . aspirin EC 81 MG tablet Take 81 mg by mouth daily.    Marland Kitchen buPROPion (WELLBUTRIN SR) 150 MG 12 hr tablet 1 tab daily for 1 week, then 1 tab BID (Patient taking differently: Take 150 mg by mouth 2 (two) times daily. ) 180 tablet 3  . levothyroxine (SYNTHROID, LEVOTHROID) 88 MCG tablet Take 1 tablet (88 mcg total) by mouth daily. (Patient taking differently: Take 88 mcg by mouth daily before breakfast. 45 minutes to 1 hours prior to breakfast) 90 tablet 3  . lisinopril (PRINIVIL,ZESTRIL) 5 MG tablet Take 1 tablet (5 mg total) by mouth daily. 90 tablet 1  . Melatonin 5 MG TABS Take 5 mg by mouth at bedtime as needed (for sleep.).     Marland Kitchen mercaptopurine (PURINETHOL) 50 MG tablet TAKE 1 AND 1/2 TABLETS EVERY DAY (Patient taking differently: Take 75 mg by mouth daily with breakfast. TAKE 1 AND 1/2 TABLETS EVERY DAY) 135 tablet 0  . Multiple Vitamins-Minerals (MULTIVITAMIN WITH MINERALS) tablet Take 1 tablet by mouth daily. One-A-Day for 50+    . pantoprazole (PROTONIX) 40 MG tablet TAKE 1 TABLET EVERY DAY (Patient taking differently: TAKE 1 TABLET EVERY DAILY AT LUNCH TIME.) 90 tablet 3  . traMADol (ULTRAM) 50 MG tablet TAKE 1 TABLET THREE TIMES DAILY (Patient taking differently: TAKE 1 TABLET (50 MG) THREE TIMES DAILY AS NEEDED FOR BACK PAIN.) 270 tablet 1  . vitamin B-12 (CYANOCOBALAMIN) 1000 MCG tablet Take 1,000 mcg by mouth  daily.     No current facility-administered medications for this visit.     Review of Systems Review of Systems  Constitutional: Negative.   Respiratory: Negative.   Cardiovascular: Negative.     Blood pressure 124/76, pulse 76, height 4' 11"  (1.499 m), weight 144 lb (65.3 kg), last menstrual period 06/03/1972.  Physical Exam Physical Exam  Constitutional: She is oriented to person, place, and time. She appears well-developed and well-nourished.  Cardiovascular: Normal rate, regular rhythm and normal heart sounds.  Pulmonary/Chest: Effort normal and breath sounds normal.  Abdominal: Soft. Bowel sounds are normal.    Neurological:  She is alert and oriented to person, place, and time.  Skin: Skin is warm and dry.    Data Reviewed DIAGNOSIS:  A. TERMINAL ILEUM AND RIGHT COLON; RIGHT COLECTOMY:  - ADENOCARCINOMA OF THE ASCENDING COLON, WITH INVASION INTO THE  MUSCULARIS PROPRIA.  - NO RESIDUAL CARCINOMA OF THE TRANSVERSE COLON; CHANGES FROM PREVIOUS  POLYPECTOMY ARE PRESENT.  - MARGINS ARE NEGATIVE FOR MALIGNANCY.  - 17 REGIONAL LYMPH NODES NEGATIVE FOR MALIGNANCY (0/17).   Comment:  Sites of previous polypectomy are identified in the ascending colon and  transverse colon. Residual serrated adenoma is present in the ascending   The patient's case was reviewed at the [facility] tumor board on September 09, 2017.  No additional imaging or therapy required.  Assessment    Doing well status post right hemicolectomy.    Plan   Patient to return in one month.The patient is aware to call back for any questions or concerns.  The patient would be a candidate for a follow-up colonoscopy in 1 year.   HPI, Physical Exam, Assessment and Plan have been scribed under the direction and in the presence of Hervey Ard, MD.  Gaspar Cola, CMA  I have completed the exam and reviewed the above documentation for accuracy and completeness.  I agree with the above.  Haematologist has  been used and any errors in dictation or transcription are unintentional.  Hervey Ard, M.D., F.A.C.S.  Forest Gleason Byrnett 09/10/2017, 7:48 PM

## 2017-09-10 ENCOUNTER — Encounter: Payer: Self-pay | Admitting: General Surgery

## 2017-09-17 NOTE — Progress Notes (Signed)
Ms Hartwell was recently presented at tumor board. She may meet the criteria for Serrated Polyposis Syndrome. There are NCCN guidelines available under detection, prevention and risk reduction, genetic/familial high-risk assessment: colorectal (SPS-1). They offer recommendations  for individuals as well as recommendations for first degree relatives. Ms. Fiore is not an active patient at the cancer center so this information will be routed to PCP, Dr.  Park Liter, Dr. Hervey Ard, and Dr. Lucilla Lame  Sessile serrated adenomas (SSAs) are often difficult to detect  endoscopically as they are typically broad flat lesions found in the  proximal colon. By morphologic definition, SSAs demonstrate  architectural (low grade) dysplasia, with or without concurrent  cytologic dysplasia.  SSAs are thought to be precursor lesions to a subset of colonic  adenocarcinomas that arise through the serrated neoplastic pathway  rather than the classical neoplasia pathway. Lesions of the serrated  pathway have a high frequency of BRAF mutation and are more likely to be  microsatellite unstable compared to tubular / villous adenomas of the  classical pathway.   Serrated polyposis syndrome, as defined by WHO, should be considered  with one of the following criteria: (1) at least 5 serrated polyps  proximal to sigmoid, with 2 or more >10 mm; (2) any serrated polyps  proximal to sigmoid with family history of serrated polyposis syndrome;  and (3) >20 serrated polyps of any size throughout the colon.   References:  Guidelines for Colonoscopy Surveillance After Screening and Polypectomy:  A Consensus Update by the [place] Multi-Society Task Force on Colorectal  Cancer. [place] [employer], 2012.

## 2017-09-20 ENCOUNTER — Other Ambulatory Visit: Payer: Self-pay

## 2017-09-20 ENCOUNTER — Telehealth: Payer: Self-pay | Admitting: Gastroenterology

## 2017-09-20 NOTE — Telephone Encounter (Signed)
[facility] 90 day supply Pantoprazole Mercaptopurine Tramadol

## 2017-09-21 ENCOUNTER — Other Ambulatory Visit: Payer: Self-pay

## 2017-09-21 MED ORDER — PANTOPRAZOLE SODIUM 40 MG PO TBEC: 40.0000 mg | DELAYED_RELEASE_TABLET | Freq: Every day | ORAL | 3 refills | Status: DC

## 2017-09-21 MED ORDER — TRAMADOL HCL 50 MG PO TABS: 50.0000 mg | ORAL_TABLET | Freq: Three times a day (TID) | ORAL | 2 refills | Status: DC

## 2017-09-21 MED ORDER — MERCAPTOPURINE 50 MG PO TABS: ORAL_TABLET | ORAL | 3 refills | Status: DC

## 2017-09-21 NOTE — Telephone Encounter (Signed)
Pt notified rx's have [facility] to [facility] [place].

## 2017-09-21 NOTE — Telephone Encounter (Signed)
Correction to previous message. Rx's [facility] to [facility] [address].

## 2017-10-12 ENCOUNTER — Encounter: Payer: Self-pay | Admitting: General Surgery

## 2017-10-12 ENCOUNTER — Ambulatory Visit (INDEPENDENT_AMBULATORY_CARE_PROVIDER_SITE_OTHER): Payer: Medicare HMO | Admitting: General Surgery

## 2017-10-12 VITALS — BP 174/90 | HR 94 | Ht 59.0 in | Wt 149.0 lb

## 2017-10-12 DIAGNOSIS — C184 Malignant neoplasm of transverse colon: Secondary | ICD-10-CM

## 2017-10-12 NOTE — Progress Notes (Signed)
Patient ID: Joanna Rodriguez, female   DOB: [birth year], 67 y.o.   MRN: [id number]  Chief Complaint  Patient presents with  . Routine Post Op    HPI Joanna Rodriguez is a 67 y.o. female.  Here for her postoperative visit, right hemicolectomy on 08-27-17. She states she hs good days and bad days. She has only vomited twice since surgery, both of these times were associated with taking her morning medicines on a near empty stomach.. Bowels move once daily based bar and are formed. She does admit to shortness of breath and has used her albuterol inhaler occasionally, as well as her Spiriva.  The latter was discontinued at her January 2019 and evaluation with pulmonology as it was not seeming to improve her symptoms.. She feels like once she gets back to the gym she will get better. Denies abdominal pain.     HPI  Past Medical History:  Diagnosis Date  . Arthritis   . Cancer [facility])    face  . COPD (chronic obstructive pulmonary disease) ([place])   . Crohn's disease ([place])    1989 ?  Marland Kitchen Dyspnea on exertion   . Genital herpes   . H/O pneumothorax    x 2  . History of kidney stones   . Hypertension   . Hypothyroid   . Kidney stones   . Osteopenia   . Personal history of tobacco use, presenting hazards to health 10/23/2015  . Scoliosis   . Spontaneous pneumothorax   . Vertigo    last episode 11/14/15  . Volvulus [facility])     Past Surgical History:  Procedure Laterality Date  . ABDOMINAL HYSTERECTOMY    . APPENDECTOMY    . BREAST EXCISIONAL BIOPSY Bilateral 1990's   NEG  . CAPSULOTOMY METATARSOPHALANGEAL Left 03/19/2016   Procedure: CAPSULOTOMY METATARSOPHALANGEAL;  Surgeon: Albertine Patricia, DPM;  Location: [place];  Service: Podiatry;  Laterality: Left;  Second left toe.  Marland Kitchen COLONOSCOPY WITH PROPOFOL N/A 08/05/2017   Procedure: COLONOSCOPY WITH PROPOFOL;  Surgeon: Lucilla Lame, MD;  Location: [place];  Service: Endoscopy;  Laterality: N/A;  .  ESOPHAGOGASTRODUODENOSCOPY (EGD) WITH PROPOFOL N/A 11/21/2015   Procedure: ESOPHAGOGASTRODUODENOSCOPY (EGD) WITH PROPOFOL with dialation;  Surgeon: Lucilla Lame, MD;  Location: [place];  Service: Endoscopy;  Laterality: N/A;  . FLEXOR TENDON REPAIR Left 03/19/2016   Procedure: FLEXOR TENDON release, percutaneous.;  Surgeon: Albertine Patricia, DPM;  Location: [place];  Service: Podiatry;  Laterality: Left;  4th left toe.  Marland Kitchen HAMMER TOE SURGERY Left 03/19/2016   Procedure: HAMMER TOE CORRECTION with PIPjoint fusion;  Surgeon: Albertine Patricia, DPM;  Location: [place];  Service: Podiatry;  Laterality: Left;  Third left toe.  Marland Kitchen HAMMER TOE SURGERY Right 08/06/2016   Procedure: HAMMER TOE CORRECTION  RIGHT 2ND AND 3RD;  Surgeon: Albertine Patricia, DPM;  Location: [place];  Service: Podiatry;  Laterality: Right;  LMA with local Special Needs:  [place]  [employer] Needs to be 2nd patient per office  . LAPAROSCOPIC RIGHT COLECTOMY Right 08/27/2017   Procedure: LAPAROSCOPIC RIGHT COLECTOMY;  Surgeon: Robert Bellow, MD;  Location: [facility];  Service: General;  Laterality: Right;  . POLYPECTOMY  08/05/2017   Procedure: POLYPECTOMY;  Surgeon: Lucilla Lame, MD;  Location: [place];  Service: Endoscopy;;  . TALC PLEURODESIS  07/2014   left side  . THORACOSCOPY  07/2014   left side  . TUBAL LIGATION    . [place], with small bowel resection  . WEIL OSTEOTOMY Right 08/06/2016   Procedure: WEIL right 2nd & 3rd;  Surgeon: Albertine Patricia, DPM;  Location: [place];  Service: Podiatry;  Laterality: Right;    Family History  Problem Relation Age of Onset  . Hypertension Mother   . Hypothyroidism Mother   . Alcohol abuse Mother   . Breast cancer Mother 68  . Hyperlipidemia Mother   . Mental illness Mother   . Heart attack Father   . Heart disease Father   . Glaucoma Father   . Colon cancer Father 58  . Diabetes Father   .  Hypertension Father   . Heart disease Sister   . Hyperlipidemia Sister   . Hypertension Sister   . Lung disease Sister     Social History Social History   Tobacco Use  . Smoking status: Former Smoker    Packs/day: 1.50    Years: 53.00    Pack years: 79.50    Types: Cigarettes    Last attempt to quit: 08/22/2014    Years since quitting: 3.1  . Smokeless tobacco: Never Used  . Tobacco comment: NOV 16,2015  Substance Use Topics  . Alcohol use: Yes    Alcohol/week: 0.0 oz    Comment: occasional - 2-3x/yr  . Drug use: No    Allergies  Allergen Reactions  . Nsaids     Other reaction(s): Caused Crohn's flare up.    Current Outpatient Medications  Medication Sig Dispense Refill  . acyclovir (ZOVIRAX) 400 MG tablet Take 1 tablet (400 mg total) by mouth daily. (Patient taking differently: Take 400 mg by mouth daily with lunch. ) 90 tablet 3  . albuterol (PROVENTIL HFA;VENTOLIN HFA) 108 (90 Base) MCG/ACT inhaler Inhale 2 puffs into the lungs every 6 (six) hours as needed for wheezing or shortness of breath. 1 Inhaler 2  . aspirin EC 81 MG tablet Take 81 mg by mouth daily.    Marland Kitchen buPROPion (WELLBUTRIN SR) 150 MG 12 hr tablet 1 tab daily for 1 week, then 1 tab BID (Patient taking differently: Take 150 mg by mouth 2 (two) times daily. ) 180 tablet 3  . levothyroxine (SYNTHROID, LEVOTHROID) 88 MCG tablet Take 1 tablet (88 mcg total) by mouth daily. (Patient taking differently: Take 88 mcg by mouth daily before breakfast. 45 minutes to 1 hours prior to breakfast) 90 tablet 3  . lisinopril (PRINIVIL,ZESTRIL) 5 MG tablet Take 1 tablet (5 mg total) by mouth daily. 90 tablet 1  . Melatonin 5 MG TABS Take 5 mg by mouth at bedtime as needed (for sleep.).     Marland Kitchen mercaptopurine (PURINETHOL) 50 MG tablet TAKE 1 AND 1/2 TABLETS EVERY DAY 135 tablet 3  . Multiple Vitamins-Minerals (MULTIVITAMIN WITH MINERALS) tablet Take 1 tablet by mouth daily. One-A-Day for 50+    . pantoprazole (PROTONIX) 40 MG  tablet Take 1 tablet (40 mg total) by mouth daily. 90 tablet 3  . traMADol (ULTRAM) 50 MG tablet Take 1 tablet (50 mg total) by mouth 3 (three) times daily. 270 tablet 2  . vitamin B-12 (CYANOCOBALAMIN) 1000 MCG tablet Take 1,000 mcg by mouth daily.     No current facility-administered medications for this visit.     Review of Systems Review of Systems  Constitutional: Negative.   Respiratory: Positive for shortness of breath.   Cardiovascular: Negative.   Gastrointestinal: Positive for vomiting. Negative for diarrhea.    Blood pressure (!) 174/90, pulse 94, height 4' 11"  (1.499  m), weight 149 lb (67.6 kg), last menstrual period 06/03/1972, SpO2 99 %.  Physical Exam Physical Exam  Constitutional: She is oriented to person, place, and time. She appears well-developed and well-nourished.  Cardiovascular: Normal rate, regular rhythm and normal heart sounds.  Pulmonary/Chest: Effort normal and breath sounds normal.  Abdominal: Soft. Normal appearance and bowel sounds are normal. There is no tenderness.    Incision is clean and healing well.   Neurological: She is alert and oriented to person, place, and time.  Skin: Skin is warm and dry.    Data Reviewed No new data.  Assessment    Doing well now 6 weeks after laparoscopically assisted right hemicolectomy for a T2, N0 carcinoma of the right colon.    Recent increase in pulmonary dysfunction and shortness of breath.  Plan   Patient to use the Spiriva inhaler daily pending reassessment by Dr. Jamal Collin..   Patient may resume activities as tolerated, taking care to use proper technique when lifting (demonstrated). The patient is aware to call back for any questions or concerns.    Would anticipate a follow-up examination by GI for a colonoscopy in February/March 2020.  HPI, Physical Exam, Assessment and Plan have been scribed under the direction and in the presence of Hervey Ard, MD.  Gaspar Cola, CMA  I have  completed the exam and reviewed the above documentation for accuracy and completeness.  I agree with the above.  Haematologist has been used and any errors in dictation or transcription are unintentional.  Hervey Ard, M.D., F.A.C.S.  Joanna Rodriguez 10/12/2017, 11:45 AM

## 2017-10-12 NOTE — Patient Instructions (Addendum)
Patient to use the albuterol inhaler daily. Patient may resume activities as tolerated, taking care to use proper technique when lifting (demonstrated). The patient is aware to call back for any questions or concerns. one year colonoscopy.

## 2017-10-13 ENCOUNTER — Telehealth: Payer: Self-pay | Admitting: *Deleted

## 2017-10-13 DIAGNOSIS — Z122 Encounter for screening for malignant neoplasm of respiratory organs: Secondary | ICD-10-CM

## 2017-10-13 DIAGNOSIS — Z87891 Personal history of nicotine dependence: Secondary | ICD-10-CM

## 2017-10-13 NOTE — Telephone Encounter (Signed)
Left message for patient to notify them that it is time to schedule annual low dose lung cancer screening CT scan. Instructed patient to call back to verify information prior to the scan being scheduled.  

## 2017-10-13 NOTE — Telephone Encounter (Signed)
Notified patient that annual lung cancer screening low dose CT scan is due currently or will be in near future. Confirmed that patient is within the age range of 55-77, and asymptomatic, (no signs or symptoms of lung cancer). Patient denies illness that would prevent curative treatment for lung cancer if found. Verified smoking history, (former, quit 2016, 53 pack year). The shared decision making visit was done 10/25/15. Patient is agreeable for CT scan being scheduled.

## 2017-10-18 DIAGNOSIS — G8929 Other chronic pain: Secondary | ICD-10-CM | POA: Diagnosis not present

## 2017-10-18 DIAGNOSIS — K5090 Crohn's disease, unspecified, without complications: Secondary | ICD-10-CM | POA: Diagnosis not present

## 2017-10-18 DIAGNOSIS — H2513 Age-related nuclear cataract, bilateral: Secondary | ICD-10-CM | POA: Diagnosis not present

## 2017-10-18 DIAGNOSIS — B009 Herpesviral infection, unspecified: Secondary | ICD-10-CM | POA: Diagnosis not present

## 2017-10-18 DIAGNOSIS — H25013 Cortical age-related cataract, bilateral: Secondary | ICD-10-CM | POA: Diagnosis not present

## 2017-10-18 DIAGNOSIS — I10 Essential (primary) hypertension: Secondary | ICD-10-CM | POA: Diagnosis not present

## 2017-10-18 DIAGNOSIS — J449 Chronic obstructive pulmonary disease, unspecified: Secondary | ICD-10-CM | POA: Diagnosis not present

## 2017-10-18 DIAGNOSIS — R69 Illness, unspecified: Secondary | ICD-10-CM | POA: Diagnosis not present

## 2017-10-18 DIAGNOSIS — H02889 Meibomian gland dysfunction of unspecified eye, unspecified eyelid: Secondary | ICD-10-CM | POA: Diagnosis not present

## 2017-10-18 DIAGNOSIS — G4700 Insomnia, unspecified: Secondary | ICD-10-CM | POA: Diagnosis not present

## 2017-10-18 DIAGNOSIS — H02886 Meibomian gland dysfunction of left eye, unspecified eyelid: Secondary | ICD-10-CM | POA: Diagnosis not present

## 2017-10-18 DIAGNOSIS — H547 Unspecified visual loss: Secondary | ICD-10-CM | POA: Diagnosis not present

## 2017-10-18 DIAGNOSIS — H02883 Meibomian gland dysfunction of right eye, unspecified eyelid: Secondary | ICD-10-CM | POA: Diagnosis not present

## 2017-10-18 DIAGNOSIS — E039 Hypothyroidism, unspecified: Secondary | ICD-10-CM | POA: Diagnosis not present

## 2017-10-27 ENCOUNTER — Ambulatory Visit
Admission: RE | Admit: 2017-10-27 | Discharge: 2017-10-27 | Disposition: A | Discharge status: Home / Self Care | Payer: Medicare HMO | Source: Ambulatory Visit | Attending: Oncology | Admitting: Oncology

## 2017-10-27 ENCOUNTER — Ambulatory Visit (INDEPENDENT_AMBULATORY_CARE_PROVIDER_SITE_OTHER): Payer: Medicare HMO | Admitting: Family Medicine

## 2017-10-27 ENCOUNTER — Ambulatory Visit (INDEPENDENT_AMBULATORY_CARE_PROVIDER_SITE_OTHER): Payer: Medicare HMO

## 2017-10-27 ENCOUNTER — Encounter: Payer: Self-pay | Admitting: Family Medicine

## 2017-10-27 VITALS — BP 138/72 | HR 92 | Temp 98.4°F | Ht 59.5 in | Wt 151.1 lb

## 2017-10-27 VITALS — BP 138/72 | HR 92 | Temp 98.4°F | Resp 18 | Ht 59.5 in | Wt 151.2 lb

## 2017-10-27 DIAGNOSIS — C182 Malignant neoplasm of ascending colon: Secondary | ICD-10-CM

## 2017-10-27 DIAGNOSIS — Z0000 Encounter for general adult medical examination without abnormal findings: Secondary | ICD-10-CM

## 2017-10-27 DIAGNOSIS — Z87891 Personal history of nicotine dependence: Secondary | ICD-10-CM | POA: Insufficient documentation

## 2017-10-27 DIAGNOSIS — K2100 Gastro-esophageal reflux disease with esophagitis, without bleeding: Secondary | ICD-10-CM

## 2017-10-27 DIAGNOSIS — Z1231 Encounter for screening mammogram for malignant neoplasm of breast: Secondary | ICD-10-CM | POA: Diagnosis not present

## 2017-10-27 DIAGNOSIS — R8281 Pyuria: Secondary | ICD-10-CM

## 2017-10-27 DIAGNOSIS — K50119 Crohn's disease of large intestine with unspecified complications: Secondary | ICD-10-CM | POA: Diagnosis not present

## 2017-10-27 DIAGNOSIS — I2510 Atherosclerotic heart disease of native coronary artery without angina pectoris: Secondary | ICD-10-CM | POA: Diagnosis not present

## 2017-10-27 DIAGNOSIS — E538 Deficiency of other specified B group vitamins: Secondary | ICD-10-CM | POA: Diagnosis not present

## 2017-10-27 DIAGNOSIS — Z1239 Encounter for other screening for malignant neoplasm of breast: Secondary | ICD-10-CM

## 2017-10-27 DIAGNOSIS — M8580 Other specified disorders of bone density and structure, unspecified site: Secondary | ICD-10-CM

## 2017-10-27 DIAGNOSIS — J929 Pleural plaque without asbestos: Secondary | ICD-10-CM | POA: Insufficient documentation

## 2017-10-27 DIAGNOSIS — I700 Atherosclerosis of aorta: Secondary | ICD-10-CM | POA: Insufficient documentation

## 2017-10-27 DIAGNOSIS — Z122 Encounter for screening for malignant neoplasm of respiratory organs: Secondary | ICD-10-CM | POA: Diagnosis not present

## 2017-10-27 DIAGNOSIS — J432 Centrilobular emphysema: Secondary | ICD-10-CM

## 2017-10-27 DIAGNOSIS — I10 Essential (primary) hypertension: Secondary | ICD-10-CM

## 2017-10-27 DIAGNOSIS — F4321 Adjustment disorder with depressed mood: Secondary | ICD-10-CM

## 2017-10-27 DIAGNOSIS — E039 Hypothyroidism, unspecified: Secondary | ICD-10-CM | POA: Diagnosis not present

## 2017-10-27 DIAGNOSIS — F17201 Nicotine dependence, unspecified, in remission: Secondary | ICD-10-CM

## 2017-10-27 DIAGNOSIS — N390 Urinary tract infection, site not specified: Secondary | ICD-10-CM

## 2017-10-27 DIAGNOSIS — R69 Illness, unspecified: Secondary | ICD-10-CM | POA: Diagnosis not present

## 2017-10-27 DIAGNOSIS — A6000 Herpesviral infection of urogenital system, unspecified: Secondary | ICD-10-CM

## 2017-10-27 DIAGNOSIS — E559 Vitamin D deficiency, unspecified: Secondary | ICD-10-CM | POA: Diagnosis not present

## 2017-10-27 DIAGNOSIS — J9383 Other pneumothorax: Secondary | ICD-10-CM

## 2017-10-27 MED ORDER — LISINOPRIL 5 MG PO TABS: 5.0000 mg | ORAL_TABLET | Freq: Every day | ORAL | 1 refills | Status: DC

## 2017-10-27 MED ORDER — BUPROPION HCL ER (XL) 300 MG PO TB24: 300.0000 mg | ORAL_TABLET | Freq: Every day | ORAL | 1 refills | Status: DC

## 2017-10-27 NOTE — Progress Notes (Signed)
BP 138/72 (BP Location: Left Arm, Patient Position: Sitting, Cuff Size: Normal)   Pulse 92   Temp 98.4 F (36.9 C)   Ht 4' 11.5" (1.511 m)   Wt 151 lb 2 oz (68.5 kg)   LMP 06/03/1972   SpO2 96%   BMI 30.01 kg/m    Subjective:    Patient ID: Joanna Rodriguez, female    DOB: [birth year], 67 y.o.   MRN: [id number]  HPI: Joanna Rodriguez is a 67 y.o. female  No chief complaint on file.  Had to have hemi-colectomy due to colon cancer. Now to have colonoscopy annually Feeling not like herself. Feeling lonely. Wants to dance. Her dog passed. Just got another one- but it's not the same. Has been tired and not feeling like herself  HYPERTENSION Hypertension status: controlled  Satisfied with current treatment? yes Duration of hypertension: chronic BP monitoring frequency:  not checking BP medication side effects:  no Medication compliance: excellent compliance Previous BP meds: Aspirin: yes Recurrent headaches: no Visual changes: no Palpitations: no Dyspnea: no Chest pain: no Lower extremity edema: no Dizzy/lightheaded: no   GERD is stable.   HYPOTHYROIDISM Thyroid control status:stable Satisfied with current treatment? yes Medication side effects: no Medication compliance: excellent compliance Recent dose adjustment:no Fatigue: yes Cold intolerance: no Heat intolerance: no Weight gain: no Weight loss: no Constipation: no Diarrhea/loose stools: no Palpitations: no Lower extremity edema: no Anxiety/depressed mood: yes  Needs recheck on her B12. Otherwise doing OK  Relevant past medical, surgical, family and social history reviewed and updated as indicated. Interim medical history since our last visit reviewed. Allergies and medications reviewed and updated.  Review of Systems  Constitutional: Positive for fatigue. Negative for activity change, appetite change, chills, diaphoresis, fever and unexpected weight change.  HENT: Negative.   Respiratory:  Negative.   Cardiovascular: Negative.   Musculoskeletal: Negative.   Skin: Negative.   Neurological: Negative.   Psychiatric/Behavioral: Positive for dysphoric mood. Negative for agitation, behavioral problems, confusion, decreased concentration, hallucinations, self-injury, sleep disturbance and suicidal ideas. The patient is nervous/anxious. The patient is not hyperactive.     Per HPI unless specifically indicated above     Objective:    BP 138/72 (BP Location: Left Arm, Patient Position: Sitting, Cuff Size: Normal)   Pulse 92   Temp 98.4 F (36.9 C)   Ht 4' 11.5" (1.511 m)   Wt 151 lb 2 oz (68.5 kg)   LMP 06/03/1972   SpO2 96%   BMI 30.01 kg/m   Wt Readings from Last 3 Encounters:  10/27/17 150 lb (68 kg)  10/27/17 151 lb 2 oz (68.5 kg)  10/27/17 151 lb 3.2 oz (68.6 kg)    Physical Exam  Constitutional: She is oriented to person, place, and time. She appears well-developed and well-nourished. No distress.  HENT:  Head: Normocephalic and atraumatic.  Right Ear: Hearing normal.  Left Ear: Hearing normal.  Nose: Nose normal.  Eyes: Conjunctivae and lids are normal. Right eye exhibits no discharge. Left eye exhibits no discharge. No scleral icterus.  Cardiovascular: Normal rate, regular rhythm, normal heart sounds and intact distal pulses. Exam reveals no gallop and no friction rub.  No murmur heard. Pulmonary/Chest: Effort normal and breath sounds normal. No stridor. No respiratory distress. She has no wheezes. She has no rales. She exhibits no tenderness.  Abdominal: Soft. Bowel sounds are normal. She exhibits no distension and no mass. There is no tenderness. There is no rebound and no guarding. No hernia.  Musculoskeletal: Normal range of motion.  Neurological: She is alert and oriented to person, place, and time.  Skin: Skin is warm, dry and intact. Capillary refill takes less than 2 seconds. No rash noted. She is not diaphoretic. No erythema. No pallor.  Psychiatric:  Her speech is normal and behavior is normal. Judgment and thought content normal. Cognition and memory are normal. She exhibits a depressed mood.  Nursing note and vitals reviewed.   Results for orders placed or performed in visit on 10/27/17  Microscopic Examination  Result Value Ref Range   WBC, UA 6-10 (A) 0 - 5 /hpf   RBC, UA 0-2 0 - 2 /hpf   Epithelial Cells (non renal) 0-10 0 - 10 /hpf   Renal Epithel, UA 0-10 (A) None seen /hpf   Crystals Present N/A   Crystal Type Calcium Oxalate N/A   Bacteria, UA Few None seen/Few  Urine Culture, Reflex  Result Value Ref Range   Urine Culture, Routine WILL FOLLOW   CBC with Differential/Platelet  Result Value Ref Range   WBC 5.6 3.4 - 10.8 x10E3/uL   RBC 2.69 (LL) 3.77 - 5.28 x10E6/uL   Hemoglobin 8.3 (L) 11.1 - 15.9 g/dL   Hematocrit 26.9 (L) 34.0 - 46.6 %   MCV 100 (H) 79 - 97 fL   MCH 30.9 26.6 - 33.0 pg   MCHC 30.9 (L) 31.5 - 35.7 g/dL   RDW 14.6 12.3 - 15.4 %   Platelets 704 (H) 150 - 379 x10E3/uL   Neutrophils 72 Not Estab. %   Lymphs 22 Not Estab. %   Monocytes 4 Not Estab. %   Eos 1 Not Estab. %   Basos 1 Not Estab. %   Neutrophils Absolute 4.0 1.4 - 7.0 x10E3/uL   Lymphocytes Absolute 1.2 0.7 - 3.1 x10E3/uL   Monocytes Absolute 0.2 0.1 - 0.9 x10E3/uL   EOS (ABSOLUTE) 0.1 0.0 - 0.4 x10E3/uL   Basophils Absolute 0.1 0.0 - 0.2 x10E3/uL   Immature Granulocytes 0 Not Estab. %   Immature Grans (Abs) 0.0 0.0 - 0.1 x10E3/uL  Comprehensive metabolic panel  Result Value Ref Range   Glucose 113 (H) 65 - 99 mg/dL   BUN 18 8 - 27 mg/dL   Creatinine, Ser 0.85 0.57 - 1.00 mg/dL   GFR calc non Af Amer 70 >59 mL/min/1.73   GFR calc Af Amer 81 >59 mL/min/1.73   BUN/Creatinine Ratio 21 12 - 28   Sodium 138 134 - 144 mmol/L   Potassium 4.7 3.5 - 5.2 mmol/L   Chloride 99 96 - 106 mmol/L   CO2 23 20 - 29 mmol/L   Calcium 9.8 8.7 - 10.3 mg/dL   Total Protein 6.3 6.0 - 8.5 g/dL   Albumin 3.7 3.6 - 4.8 g/dL   Globulin, Total 2.6 1.5 -  4.5 g/dL   Albumin/Globulin Ratio 1.4 1.2 - 2.2   Bilirubin Total 0.3 0.0 - 1.2 mg/dL   Alkaline Phosphatase 53 39 - 117 IU/L   AST 39 0 - 40 IU/L   ALT 46 (H) 0 - 32 IU/L  Lipid Panel w/o Chol/HDL Ratio  Result Value Ref Range   Cholesterol, Total 112 100 - 199 mg/dL   Triglycerides 146 0 - 149 mg/dL   HDL 54 >39 mg/dL   VLDL Cholesterol Cal 29 5 - 40 mg/dL   LDL Calculated 29 0 - 99 mg/dL  TSH  Result Value Ref Range   TSH 8.550 (H) 0.450 - 4.500 uIU/mL  UA/M w/rflx  Culture, Routine  Result Value Ref Range   Specific Gravity, UA 1.025 1.005 - 1.030   pH, UA 5.5 5.0 - 7.5   Color, UA Orange Yellow   Appearance Ur Cloudy (A) Clear   Leukocytes, UA Trace (A) Negative   Protein, UA Trace (A) Negative/Trace   Glucose, UA Negative Negative   Ketones, UA Trace (A) Negative   RBC, UA Negative Negative   Bilirubin, UA Negative Negative   Urobilinogen, Ur 0.2 0.2 - 1.0 mg/dL   Nitrite, UA Negative Negative   Microscopic Examination See below:    Urinalysis Reflex Comment   VITAMIN D 25 Hydroxy (Vit-D Deficiency, Fractures)  Result Value Ref Range   Vit D, 25-Hydroxy 88.6 30.0 - 100.0 ng/mL  B12 and Folate Panel  Result Value Ref Range   Vitamin B-12 978 232 - 1,245 pg/mL   Folate >20.0 >3.0 ng/mL      Assessment & Plan:   Problem List Items Addressed This Visit      Cardiovascular and Mediastinum   HTN (hypertension) - Primary    Under good control. Continue current regimen. Continue to monitor. Call with any concerns. Refills given. Call with any concerns.       Relevant Medications   lisinopril (PRINIVIL,ZESTRIL) 5 MG tablet   Other Relevant Orders   CBC with Differential/Platelet (Completed)   Comprehensive metabolic panel (Completed)   UA/M w/rflx Culture, Routine (Completed)   CAD (coronary artery disease)    Stable. Will keep her BP and cholesterol under good control. Continue to monitor. Call with any concerns.       Relevant Medications   lisinopril  (PRINIVIL,ZESTRIL) 5 MG tablet   Other Relevant Orders   CBC with Differential/Platelet (Completed)   Comprehensive metabolic panel (Completed)   Lipid Panel w/o Chol/HDL Ratio (Completed)   UA/M w/rflx Culture, Routine (Completed)     Respiratory   COPD (chronic obstructive pulmonary disease) ([facility])    Stable. Continue current regimen. Continue to follow with pulmonology as needed. Call with any concerns.       Relevant Orders   CBC with Differential/Platelet (Completed)   Comprehensive metabolic panel (Completed)   UA/M w/rflx Culture, Routine (Completed)   Spontaneous pneumothorax    Stable. No reoccurrence.        Digestive   CC (Crohn's colitis) ([place])    Follows with GI. Recent hemicolectomy. Checking labs. Await results.       Relevant Orders   CBC with Differential/Platelet (Completed)   Comprehensive metabolic panel (Completed)   UA/M w/rflx Culture, Routine (Completed)   Reflux esophagitis    Under good control. Continue current regimen. Continue to monitor. Call with any concerns. Refills given. Call with any concerns.       Primary adenocarcinoma of ascending colon ([place])    Follows with GI and surgery. Continue to monitor. Colonoscopy annually. S/P hemicolectomy.        Endocrine   Thyroid activity decreased    Rechecking levels today. Await results. Call with any concerns.       Relevant Orders   CBC with Differential/Platelet (Completed)   Comprehensive metabolic panel (Completed)   TSH (Completed)   UA/M w/rflx Culture, Routine (Completed)     Musculoskeletal and Integument   Osteopenia    Checking vitamin D today. Await results. Continue weight bearing exercise.       Relevant Orders   CBC with Differential/Platelet (Completed)   Comprehensive metabolic panel (Completed)   UA/M w/rflx Culture, Routine (Completed)   VITAMIN  D 25 Hydroxy (Vit-D Deficiency, Fractures) (Completed)     Genitourinary   Genital herpes    Stable. Continue  valacyclovir.        Other   B12 deficiency    Rechecking levels today. Await results. Call with any concerns.       Relevant Orders   CBC with Differential/Platelet (Completed)   Comprehensive metabolic panel (Completed)   UA/M w/rflx Culture, Routine (Completed)   B12 and Folate Panel (Completed)   Tobacco abuse, in remission    Stable. Continues to be off cigarettes. Continue to monitor.       Relevant Orders   CBC with Differential/Platelet (Completed)   Comprehensive metabolic panel (Completed)   UA/M w/rflx Culture, Routine (Completed)   Adjustment disorder with depressed mood    Not doing well. Will increase wellbutrin and check labs for organic cause. Recheck 1 month. Call with any concerns.           Follow up plan: Return in about 1 month (around 11/24/2017) for follow up mood.

## 2017-10-27 NOTE — Progress Notes (Signed)
Subjective:   Joanna Rodriguez is a 67 y.o. female who presents for Medicare Annual (Subsequent) preventive examination.  Review of Systems:   Cardiac Risk Factors include: advanced age (>69mn, >>28women);smoking/ tobacco exposure;hypertension;dyslipidemia;obesity (BMI >30kg/m2)     Objective:     Vitals: BP 138/72 (BP Location: Left Arm, Patient Position: Sitting)   Pulse 92   Temp 98.4 F (36.9 C) (Temporal)   Resp 18   Ht 4' 11.5" (1.511 m)   Wt 151 lb 3.2 oz (68.6 kg)   LMP 06/03/1972   SpO2 96%   BMI 30.03 kg/m   Body mass index is 30.03 kg/m.  Advanced Directives 10/27/2017 08/27/2017 08/26/2017 08/05/2017 10/27/2016 08/06/2016 04/09/2016  Does Patient Have a Medical Advance Directive? Yes Yes Yes Yes Yes Yes Yes  Type of AParamedicof APorterLiving will Healthcare Power of AChapmanLiving will HEtowahLiving will HBrownwoodLiving will HCastlewoodLiving will HNorth Charleroi Does patient want to make changes to medical advance directive? - No - Patient declined - No - Patient declined No - Patient declined - -  Copy of HMacedoniain Chart? Yes No - copy requested No - copy requested No - copy requested Yes Yes Yes    Tobacco Social History   Tobacco Use  Smoking Status Former Smoker  . Packs/day: 1.50  . Years: 53.00  . Pack years: 79.50  . Types: Cigarettes  . Last attempt to quit: 08/22/2014  . Years since quitting: 3.1  Smokeless Tobacco Never Used  Tobacco Comment   NOV 16,2015     Counseling given: Not Answered Comment: NOV 16,2015   Clinical Intake:  Pre-visit preparation completed: Yes  Pain : No/denies pain     Nutritional Status: BMI > 30  Obese Nutritional Risks: None Diabetes: No  How often do you need to have someone help you when you read instructions, pamphlets, or other written materials from  your doctor or pharmacy?: 1 - Never What is the last grade level you completed in school?: some college   Interpreter Needed?: No  Information entered by :: Slade Pierpoint,LPN   Past Medical History:  Diagnosis Date  . Arthritis   . Cancer ([facility]    face  . COPD (chronic obstructive pulmonary disease) ([facility]   . Crohn's disease ([facility]    1989 ?  .Marland KitchenDyspnea on exertion   . Genital herpes   . H/O pneumothorax    x 2  . History of kidney stones   . Hypertension   . Hypothyroid   . Kidney stones   . Osteopenia   . Personal history of tobacco use, presenting hazards to health 10/23/2015  . Scoliosis   . Spontaneous pneumothorax   . Vertigo    last episode 11/14/15  . Volvulus ([facility]    Past Surgical History:  Procedure Laterality Date  . ABDOMINAL HYSTERECTOMY    . APPENDECTOMY    . BREAST EXCISIONAL BIOPSY Bilateral 1990's   NEG  . CAPSULOTOMY METATARSOPHALANGEAL Left 03/19/2016   Procedure: CAPSULOTOMY METATARSOPHALANGEAL;  Surgeon: MAlbertine Patricia DPM;  [place]  Service: Podiatry;  Laterality: Left;  Second left toe.  .Marland KitchenCOLONOSCOPY WITH PROPOFOL N/A 08/05/2017   Procedure: COLONOSCOPY WITH PROPOFOL;  Surgeon: WLucilla Lame MD;  [place]  Service: Endoscopy;  Laterality: N/A;  . ESOPHAGOGASTRODUODENOSCOPY (EGD) WITH PROPOFOL N/A 11/21/2015   Procedure: ESOPHAGOGASTRODUODENOSCOPY (EGD) WITH PROPOFOL  with dialation;  Surgeon: Lucilla Lame, MD;  Location: [place];  Service: Endoscopy;  Laterality: N/A;  . FLEXOR TENDON REPAIR Left 03/19/2016   Procedure: FLEXOR TENDON release, percutaneous.;  Surgeon: Albertine Patricia, DPM;  Location: [place];  Service: Podiatry;  Laterality: Left;  4th left toe.  Marland Kitchen HAMMER TOE SURGERY Left 03/19/2016   Procedure: HAMMER TOE CORRECTION with PIPjoint fusion;  Surgeon: Albertine Patricia, DPM;  Location: [place];  Service: Podiatry;  Laterality: Left;  Third left toe.  Marland Kitchen HAMMER TOE  SURGERY Right 08/06/2016   Procedure: HAMMER TOE CORRECTION  RIGHT 2ND AND 3RD;  Surgeon: Albertine Patricia, DPM;  Location: [place];  Service: Podiatry;  Laterality: Right;  LMA with local Special Needs:  [place]  Mini Monster Needs to be 2nd patient per office  . LAPAROSCOPIC RIGHT COLECTOMY Right 08/27/2017   Procedure: LAPAROSCOPIC RIGHT COLECTOMY;  Surgeon: Robert Bellow, MD;  Location: [facility];  Service: General;  Laterality: Right;  . POLYPECTOMY  08/05/2017   Procedure: POLYPECTOMY;  Surgeon: Lucilla Lame, MD;  Location: [place];  Service: Endoscopy;;  . TALC PLEURODESIS  07/2014   left side  . THORACOSCOPY  07/2014   left side  . TUBAL LIGATION    . [place], with small bowel resection  . WEIL OSTEOTOMY Right 08/06/2016   Procedure: WEIL right 2nd & 3rd;  Surgeon: Albertine Patricia, DPM;  Location: [place];  Service: Podiatry;  Laterality: Right;   Family History  Problem Relation Age of Onset  . Hypertension Mother   . Hypothyroidism Mother   . Alcohol abuse Mother   . Breast cancer Mother 12  . Hyperlipidemia Mother   . Mental illness Mother   . Heart attack Father   . Heart disease Father   . Glaucoma Father   . Colon cancer Father 95  . Diabetes Father   . Hypertension Father   . Heart disease Sister   . Hyperlipidemia Sister   . Hypertension Sister   . Lung disease Sister    Social History   Socioeconomic History  . Marital status: Divorced    Spouse name: Not on file  . Number of children: Not on file  . Years of education: Not on file  . Highest education level: Not on file  Occupational History  . Occupation: Press photographer    Comment: Sales force Rep - stocking\packing  Social Needs  . Financial resource strain: Not hard at all  . Food insecurity:    Worry: Never true    Inability: Never true  . Transportation needs:    Medical: No    Non-medical: No  Tobacco Use  . Smoking status: Former Smoker     Packs/day: 1.50    Years: 53.00    Pack years: 79.50    Types: Cigarettes    Last attempt to quit: 08/22/2014    Years since quitting: 3.1  . Smokeless tobacco: Never Used  . Tobacco comment: NOV 16,2015  Substance and Sexual Activity  . Alcohol use: Yes    Alcohol/week: 0.0 oz    Comment: occasional - 2-3x/yr  . Drug use: No  . Sexual activity: Never  Lifestyle  . Physical activity:    Days per week: 0 days    Minutes per session: 0 min  . Stress: Not at all  Relationships  . Social connections:    Talks on phone: More than three times a week  Gets together: More than three times a week    Attends religious service: Never    Active member of club or organization: Yes    Attends meetings of clubs or organizations: More than 4 times per year    Relationship status: Divorced  Other Topics Concern  . Not on file  Social History Narrative   Volunteers at senior center     Outpatient Encounter Medications as of 10/27/2017  Medication Sig  . acyclovir (ZOVIRAX) 400 MG tablet Take 1 tablet (400 mg total) by mouth daily. (Patient taking differently: Take 400 mg by mouth daily with lunch. )  . albuterol (PROVENTIL HFA;VENTOLIN HFA) 108 (90 Base) MCG/ACT inhaler Inhale 2 puffs into the lungs every 6 (six) hours as needed for wheezing or shortness of breath.  Marland Kitchen aspirin EC 81 MG tablet Take 81 mg by mouth daily.  Marland Kitchen buPROPion (WELLBUTRIN SR) 150 MG 12 hr tablet 1 tab daily for 1 week, then 1 tab BID (Patient taking differently: Take 150 mg by mouth 2 (two) times daily. )  . levothyroxine (SYNTHROID, LEVOTHROID) 88 MCG tablet Take 1 tablet (88 mcg total) by mouth daily. (Patient taking differently: Take 88 mcg by mouth daily before breakfast. 45 minutes to 1 hours prior to breakfast)  . lisinopril (PRINIVIL,ZESTRIL) 5 MG tablet Take 1 tablet (5 mg total) by mouth daily.  . mercaptopurine (PURINETHOL) 50 MG tablet TAKE 1 AND 1/2 TABLETS EVERY DAY  . Multiple Vitamins-Minerals (MULTIVITAMIN  WITH MINERALS) tablet Take 1 tablet by mouth daily. One-A-Day for 50+  . pantoprazole (PROTONIX) 40 MG tablet Take 1 tablet (40 mg total) by mouth daily.  . traMADol (ULTRAM) 50 MG tablet Take 1 tablet (50 mg total) by mouth 3 (three) times daily.  . vitamin B-12 (CYANOCOBALAMIN) 1000 MCG tablet Take 1,000 mcg by mouth daily.  . Melatonin 5 MG TABS Take 10 mg by mouth at bedtime as needed (for sleep.).    No facility-administered encounter medications on file as of 10/27/2017.     Activities of Daily Living In your present state of health, do you have any difficulty performing the following activities: 10/27/2017 08/27/2017  Hearing? N N  Comment - -  Vision? N N  Difficulty concentrating or making decisions? N N  Walking or climbing stairs? Y N  Comment SOB  -  Dressing or bathing? N N  Doing errands, shopping? N N  Preparing Food and eating ? N -  Using the Toilet? N -  In the past six months, have you accidently leaked urine? N -  Do you have problems with loss of bowel control? N -  Managing your Medications? N -  Managing your Finances? N -  Housekeeping or managing your Housekeeping? N -  Some recent data might be hidden    Patient Care Team: Valerie Roys, DO as PCP - General (Family Medicine) Lucilla Lame, MD as Consulting Physician (Gastroenterology) Wilhelmina Mcardle, MD as Consulting Physician (Pulmonary Disease)    Assessment:   This is a routine wellness examination for [place].  Exercise Activities and Dietary recommendations Current Exercise Habits: The patient does not participate in regular exercise at present, Exercise limited by: respiratory conditions(s)  Goals    . DIET - INCREASE WATER INTAKE     Recommend drinking at least 6-8 glasses of water        Fall Risk Fall Risk  10/27/2017 03/22/2017 12/28/2016 10/27/2016 04/22/2016  Falls in the past year? No No No No No  Is the patient's home free of loose throw rugs in walkways, pet beds, electrical cords,  etc?   yes      Grab bars in the bathroom? yes      Handrails on the stairs?   no      Adequate lighting?   yes  Timed Get Up and Go performed: Completed in 9 seconds with no use of assistive devices, steady gait. No intervention needed at this time.   Depression Screen PHQ 2/9 Scores 10/27/2017 03/22/2017 12/28/2016 10/27/2016  PHQ - 2 Score 2 0 0 0  PHQ- 9 Score 9 3 7  -     Cognitive Function     6CIT Screen 10/27/2017 10/27/2016  What Year? 0 points 0 points  What month? 0 points 0 points  What time? 0 points 0 points  Count back from 20 0 points 0 points  Months in reverse 0 points 0 points  Repeat phrase 2 points 0 points  Total Score 2 0    Immunization History  Administered Date(s) Administered  . Influenza, High Dose Seasonal PF 04/29/2017  . Influenza-Unspecified 05/13/2012, 03/23/2013, 03/08/2014, 03/09/2015, 02/07/2016  . PPD Test 01/26/2014  . Pneumococcal Conjugate-13 03/18/2015  . Pneumococcal Polysaccharide-23 02/07/2016  . Pneumococcal-Unspecified 02/07/2014, 03/08/2014  . Tdap 07/30/2009    Qualifies for Shingles Vaccine? No  Screening Tests Health Maintenance  Topic Date Due  . INFLUENZA VACCINE  01/27/2018  . COLONOSCOPY  08/05/2018  . MAMMOGRAM  12/05/2018  . TETANUS/TDAP  07/31/2019  . DEXA SCAN  Completed  . Hepatitis C Screening  Completed  . PNA vac Low Risk Adult  Completed    Cancer Screenings: Lung: Low Dose CT Chest recommended if Age 7-80 years, 30 pack-year currently smoking OR have quit w/in 15years. Patient does qualify. Scheduled for today at 1pm.  Breast:  Up to date on Mammogram? Yes  12/04/2016 Up to date of Bone Density/Dexa? Yes 07/04/2015 Colorectal: completed 08/05/2017  Additional Screenings:  Hepatitis C Screening: completed 05/20/2015     Plan:    I have personally reviewed and addressed the Medicare Annual Wellness questionnaire and have noted the following in the patient's chart:  A. Medical and social history B. Use of  alcohol, tobacco or illicit drugs  C. Current medications and supplements D. Functional ability and status E.  Nutritional status F.  Physical activity G. Advance directives H. List of other physicians I.  Hospitalizations, surgeries, and ER visits in previous 12 months J.  [place] such as hearing and vision if needed, cognitive and depression L. Referrals and appointments   In addition, I have reviewed and discussed with patient certain preventive protocols, quality metrics, and best practice recommendations. A written personalized care plan for preventive services as well as general preventive health recommendations were provided to patient.   Signed,  Tyler Aas, LPN Nurse Health Advisor   Nurse Notes: Informed MD of PHQ-9 , patient denies having and SI/HI currently, she has never thought out a suicide plan, states she just has felt like she would be better off sometimes.

## 2017-10-27 NOTE — Patient Instructions (Signed)
[employer] [place] CLASSIC [place], [place] [address]  [employer] [address] [place], [place] [address]  [place], [place] [address]  [place] [address] [place], [place] [address]  Arispe Covington [place], [place] [address]  Studio [employer] [place], [place] [address]  [employer] [place], [place] [address]  [facility] [place], [place] [address]  Sherren Kerns [place], [place] [address]  [place], [place] [address]  In[place], [place] [address]  [facility] [address] [place], [place] [address]  [facility] [place], [place] [address]  Fine Arts [place], [place] [address]  [facility] [place], [place] [address]  Sidney [place], [place] [address]  [place], [place] [address]  [place] [address] [place], [place] [address]  Dance Co[employer]Op [place], [place] [address]  [place], [place] [address]  Constance [facility] [employer] [address] [place], [place] [address]  [employer] [address] [place], [place] [address]  Cheer [address] [place], [place] [address]  Blanche Ravenel [place], [place] [address]  [place], [place] [address]  [facility] Studio [address] [place], [place] [address]  [employer] Dorothy Lewis [place], [place] [address]  [place] [address] [place], [place] [address]  [place], [place] [address]  [facility] &T [address] GCB,  Visual and Performing [place], [place] [address]  Tad Moore [place], [place] [address]  [facility] [place], [place] [address]  [place] [address] [place], [place] [address]  [employer] ([address]) [place], [place] [address]  [employer] [facility]) [place], [place] [address]  [place] Studio ([place]) [place], [place] [address]  [place] Dance [facility]) [place], [place] [address]  [facility] [place], [place] [address]  [place], [place] [address]  [place], [place] [address]  Wayne Both and Scarlette Ar - [address] [place], [place] [address]  Rolm Bookbinder - [address] [place], [place] [address]  [place], [place] [address]  Fraser Din and Durene Fruits - [address] [place], [place] [address]  BSD[employer]Ballroom and Social [place],  [address]  Cassia Studio [place]) [place],  [address]

## 2017-10-27 NOTE — Patient Instructions (Addendum)
Joanna Rodriguez , Thank you for taking time to come for your Medicare Wellness Visit. I appreciate your ongoing commitment to your health goals. Please review the following plan we discussed and let me know if I can assist you in the future.   Screening recommendations/referrals: Colonoscopy: completed 08/05/2017 Mammogram: completed 12/04/2017 Please call [phone] to schedule your mammogram.  Bone Density: completed 07/04/2015 Recommended yearly ophthalmology/optometry visit for glaucoma screening and checkup Recommended yearly dental visit for hygiene and checkup  Vaccinations: Influenza vaccine: up to date, due 02/2018 Pneumococcal vaccine: completed series Tdap vaccine: up to date Shingles vaccine: not needed  Advanced directives: copy on file.  Conditions/risks identified: Recommend drinking at least 6-8 glasses of water   Next appointment: Follow up in one year for your annual wellness exam.    Preventive Care 65 Years and Older, Female Preventive care refers to lifestyle choices and visits with your health care provider that can promote health and wellness. What does preventive care include?  A yearly physical exam. This is also called an annual well check.  Dental exams once or twice a year.  Routine eye exams. Ask your health care provider how often you should have your eyes checked.  Personal lifestyle choices, including:  Daily care of your teeth and gums.  Regular physical activity.  Eating a healthy diet.  Avoiding tobacco and drug use.  Limiting alcohol use.  Practicing safe sex.  Taking low-dose aspirin every day.  Taking vitamin and mineral supplements as recommended by your health care provider. What happens during an annual well check? The services and screenings done by your health care provider during your annual well check will depend on your age, overall health, lifestyle risk factors, and family history of disease. Counseling  Your health care  provider may ask you questions about your:  Alcohol use.  Tobacco use.  Drug use.  Emotional well-being.  Home and relationship well-being.  Sexual activity.  Eating habits.  History of falls.  Memory and ability to understand (cognition).  Work and work Statistician.  Reproductive health. Screening  You may have the following tests or measurements:  Height, weight, and BMI.  Blood pressure.  Lipid and cholesterol levels. These may be checked every 5 years, or more frequently if you are over 80 years old.  Skin check.  Lung cancer screening. You may have this screening every year starting at age 11 if you have a 30-pack-year history of smoking and currently smoke or have quit within the past 15 years.  Fecal occult blood test (FOBT) of the stool. You may have this test every year starting at age 59.  Flexible sigmoidoscopy or colonoscopy. You may have a sigmoidoscopy every 5 years or a colonoscopy every 10 years starting at age 35.  Hepatitis C blood test.  Hepatitis B blood test.  Sexually transmitted disease (STD) testing.  Diabetes screening. This is done by checking your blood sugar (glucose) after you have not eaten for a while (fasting). You may have this done every 1-3 years.  Bone density scan. This is done to screen for osteoporosis. You may have this done starting at age 26.  Mammogram. This may be done every 1-2 years. Talk to your health care provider about how often you should have regular mammograms. Talk with your health care provider about your test results, treatment options, and if necessary, the need for more tests. Vaccines  Your health care provider may recommend certain vaccines, such as:  Influenza vaccine. This is recommended  every year.  Tetanus, diphtheria, and acellular pertussis (Tdap, Td) vaccine. You may need a Td booster every 10 years.  Zoster vaccine. You may need this after age 19.  Pneumococcal 13-valent conjugate (PCV13)  vaccine. One dose is recommended after age 57.  Pneumococcal polysaccharide (PPSV23) vaccine. One dose is recommended after age 23. Talk to your health care provider about which screenings and vaccines you need and how often you need them. This information is not intended to replace advice given to you by your health care provider. Make sure you discuss any questions you have with your health care provider. Document Released: 07/12/2015 Document Revised: 03/04/2016 Document Reviewed: 04/16/2015 Elsevier Interactive Patient Education  2017 [place] Prevention in the Home Falls can cause injuries. They can happen to people of all ages. There are many things you can do to make your home safe and to help prevent falls. What can I do on the outside of my home?  Regularly fix the edges of walkways and driveways and fix any cracks.  Remove anything that might make you trip as you walk through a door, such as a raised step or threshold.  Trim any bushes or trees on the path to your home.  Use bright outdoor lighting.  Clear any walking paths of anything that might make someone trip, such as rocks or tools.  Regularly check to see if handrails are loose or broken. Make sure that both sides of any steps have handrails.  Any raised decks and porches should have guardrails on the edges.  Have any leaves, snow, or ice cleared regularly.  Use sand or salt on walking paths during winter.  Clean up any spills in your garage right away. This includes oil or grease spills. What can I do in the bathroom?  Use night lights.  Install grab bars by the toilet and in the tub and shower. Do not use towel bars as grab bars.  Use non-skid mats or decals in the tub or shower.  If you need to sit down in the shower, use a plastic, non-slip stool.  Keep the floor dry. Clean up any water that spills on the floor as soon as it happens.  Remove soap buildup in the tub or shower  regularly.  Attach bath mats securely with double-sided non-slip rug tape.  Do not have throw rugs and other things on the floor that can make you trip. What can I do in the bedroom?  Use night lights.  Make sure that you have a light by your bed that is easy to reach.  Do not use any sheets or blankets that are too big for your bed. They should not hang down onto the floor.  Have a firm chair that has side arms. You can use this for support while you get dressed.  Do not have throw rugs and other things on the floor that can make you trip. What can I do in the kitchen?  Clean up any spills right away.  Avoid walking on wet floors.  Keep items that you use a lot in easy-to-reach places.  If you need to reach something above you, use a strong step stool that has a grab bar.  Keep electrical cords out of the way.  Do not use floor polish or wax that makes floors slippery. If you must use wax, use non-skid floor wax.  Do not have throw rugs and other things on the floor that can make you trip. What  can I do with my stairs?  Do not leave any items on the stairs.  Make sure that there are handrails on both sides of the stairs and use them. Fix handrails that are broken or loose. Make sure that handrails are as long as the stairways.  Check any carpeting to make sure that it is firmly attached to the stairs. Fix any carpet that is loose or worn.  Avoid having throw rugs at the top or bottom of the stairs. If you do have throw rugs, attach them to the floor with carpet tape.  Make sure that you have a light switch at the top of the stairs and the bottom of the stairs. If you do not have them, ask someone to add them for you. What else can I do to help prevent falls?  Wear shoes that:  Do not have high heels.  Have rubber bottoms.  Are comfortable and fit you well.  Are closed at the toe. Do not wear sandals.  If you use a stepladder:  Make sure that it is fully  opened. Do not climb a closed stepladder.  Make sure that both sides of the stepladder are locked into place.  Ask someone to hold it for you, if possible.  Clearly mark and make sure that you can see:  Any grab bars or handrails.  First and last steps.  Where the edge of each step is.  Use tools that help you move around (mobility aids) if they are needed. These include:  Canes.  Walkers.  Scooters.  Crutches.  Turn on the lights when you go into a dark area. Replace any light bulbs as soon as they burn out.  Set up your furniture so you have a clear path. Avoid moving your furniture around.  If any of your floors are uneven, fix them.  If there are any pets around you, be aware of where they are.  Review your medicines with your doctor. Some medicines can make you feel dizzy. This can increase your chance of falling. Ask your doctor what other things that you can do to help prevent falls. This information is not intended to replace advice given to you by your health care provider. Make sure you discuss any questions you have with your health care provider. Document Released: 04/11/2009 Document Revised: 11/21/2015 Document Reviewed: 07/20/2014 Elsevier Interactive Patient Education  2017 [employer].

## 2017-10-28 DIAGNOSIS — F4321 Adjustment disorder with depressed mood: Secondary | ICD-10-CM | POA: Insufficient documentation

## 2017-10-28 LAB — COMPREHENSIVE METABOLIC PANEL
A/G RATIO: 1.4 (ref 1.2–2.2)
ALBUMIN: 3.7 g/dL (ref 3.6–4.8)
ALT: 46 IU/L — ABNORMAL HIGH (ref 0–32)
AST: 39 IU/L (ref 0–40)
Alkaline Phosphatase: 53 IU/L (ref 39–117)
BUN / CREAT RATIO: 21 (ref 12–28)
BUN: 18 mg/dL (ref 8–27)
Bilirubin Total: 0.3 mg/dL (ref 0.0–1.2)
CALCIUM: 9.8 mg/dL (ref 8.7–10.3)
CO2: 23 mmol/L (ref 20–29)
CREATININE: 0.85 mg/dL (ref 0.57–1.00)
Chloride: 99 mmol/L (ref 96–106)
GFR, EST AFRICAN AMERICAN: 81 mL/min/{1.73_m2} (ref >59)
GFR, EST NON AFRICAN AMERICAN: 70 mL/min/{1.73_m2} (ref >59)
GLOBULIN, TOTAL: 2.6 g/dL (ref 1.5–4.5)
Glucose: 113 mg/dL — ABNORMAL HIGH (ref 65–99)
POTASSIUM: 4.7 mmol/L (ref 3.5–5.2)
SODIUM: 138 mmol/L (ref 134–144)
TOTAL PROTEIN: 6.3 g/dL (ref 6.0–8.5)

## 2017-10-28 LAB — CBC WITH DIFFERENTIAL/PLATELET
BASOS: 1 %
Basophils Absolute: 0.1 10*3/uL (ref 0.0–0.2)
EOS (ABSOLUTE): 0.1 10*3/uL (ref 0.0–0.4)
EOS: 1 %
HEMATOCRIT: 26.9 % — AB (ref 34.0–46.6)
HEMOGLOBIN: 8.3 g/dL — AB (ref 11.1–15.9)
IMMATURE GRANULOCYTES: 0 %
Immature Grans (Abs): 0 10*3/uL (ref 0.0–0.1)
LYMPHS ABS: 1.2 10*3/uL (ref 0.7–3.1)
Lymphs: 22 %
MCH: 30.9 pg (ref 26.6–33.0)
MCHC: 30.9 g/dL — ABNORMAL LOW (ref 31.5–35.7)
MCV: 100 fL — AB (ref 79–97)
MONOCYTES: 4 %
MONOS ABS: 0.2 10*3/uL (ref 0.1–0.9)
NEUTROS PCT: 72 %
Neutrophils Absolute: 4 10*3/uL (ref 1.4–7.0)
Platelets: 704 10*3/uL — ABNORMAL HIGH (ref 150–379)
RBC: 2.69 x10E6/uL — CL (ref 3.77–5.28)
RDW: 14.6 % (ref 12.3–15.4)
WBC: 5.6 10*3/uL (ref 3.4–10.8)

## 2017-10-28 LAB — LIPID PANEL W/O CHOL/HDL RATIO
Cholesterol, Total: 112 mg/dL (ref 100–199)
HDL: 54 mg/dL (ref >39)
LDL CALC: 29 mg/dL (ref 0–99)
Triglycerides: 146 mg/dL (ref 0–149)
VLDL Cholesterol Cal: 29 mg/dL (ref 5–40)

## 2017-10-28 LAB — TSH: TSH: 8.55 u[IU]/mL — ABNORMAL HIGH (ref 0.450–4.500)

## 2017-10-28 LAB — VITAMIN D 25 HYDROXY (VIT D DEFICIENCY, FRACTURES): VIT D 25 HYDROXY: 88.6 ng/mL (ref 30.0–100.0)

## 2017-10-28 LAB — B12 AND FOLATE PANEL
Folate: >20 ng/mL (ref >3.0)
Vitamin B-12: 978 pg/mL (ref 232–1245)

## 2017-10-28 NOTE — Assessment & Plan Note (Signed)
Under good control. Continue current regimen. Continue to monitor. Call with any concerns. Refills given. Call with any concerns.

## 2017-10-28 NOTE — Assessment & Plan Note (Signed)
Rechecking levels today. Await results. Call with any concerns.  

## 2017-10-28 NOTE — Assessment & Plan Note (Signed)
Stable. Will keep her BP and cholesterol under good control. Continue to monitor. Call with any concerns.

## 2017-10-28 NOTE — Assessment & Plan Note (Signed)
Follows with GI and surgery. Continue to monitor. Colonoscopy annually. S/P hemicolectomy.

## 2017-10-28 NOTE — Assessment & Plan Note (Signed)
Stable. No reoccurrence.

## 2017-10-28 NOTE — Assessment & Plan Note (Signed)
Stable. Continues to be off cigarettes. Continue to monitor.

## 2017-10-28 NOTE — Assessment & Plan Note (Signed)
Checking vitamin D today. Await results. Continue weight bearing exercise.

## 2017-10-28 NOTE — Assessment & Plan Note (Signed)
Follows with GI. Recent hemicolectomy. Checking labs. Await results.

## 2017-10-28 NOTE — Assessment & Plan Note (Signed)
Stable. Continue current regimen. Continue to follow with pulmonology as needed. Call with any concerns.

## 2017-10-28 NOTE — Assessment & Plan Note (Signed)
Not doing well. Will increase wellbutrin and check labs for organic cause. Recheck 1 month. Call with any concerns.

## 2017-10-28 NOTE — Assessment & Plan Note (Signed)
Stable. Continue valacyclovir.

## 2017-10-29 ENCOUNTER — Telehealth: Payer: Self-pay | Admitting: Family Medicine

## 2017-10-29 MED ORDER — LEVOTHYROXINE SODIUM 100 MCG PO TABS: 100.0000 ug | ORAL_TABLET | Freq: Every day | ORAL | 2 refills | Status: DC

## 2017-10-29 MED ORDER — FERROUS SULFATE 325 (65 FE) MG PO TABS: 325.0000 mg | ORAL_TABLET | Freq: Three times a day (TID) | ORAL | 3 refills | Status: DC

## 2017-10-29 NOTE — Telephone Encounter (Signed)
Called and spoke to patient. Blood count is down and thyroid level is up. Will change her thyroid dose and start her on iron. Recheck at follow up in 1 month. Call with any concerns.

## 2017-10-30 LAB — MICROSCOPIC EXAMINATION

## 2017-10-30 LAB — UA/M W/RFLX CULTURE, ROUTINE
BILIRUBIN UA: NEGATIVE
GLUCOSE, UA: NEGATIVE
NITRITE UA: NEGATIVE
RBC UA: NEGATIVE
SPEC GRAV UA: 1.025 (ref 1.005–1.030)
UUROB: 0.2 mg/dL (ref 0.2–1.0)
pH, UA: 5.5 (ref 5.0–7.5)

## 2017-10-30 LAB — URINE CULTURE, REFLEX

## 2017-11-02 ENCOUNTER — Encounter: Payer: Self-pay | Admitting: *Deleted

## 2017-11-05 ENCOUNTER — Telehealth: Payer: Self-pay | Admitting: Pulmonary Disease

## 2017-11-05 NOTE — Telephone Encounter (Signed)
Pt wanted to let Dr. Alva Garnet know she has started back on her Spiriva.

## 2017-11-15 ENCOUNTER — Telehealth: Payer: Self-pay

## 2017-11-15 NOTE — Telephone Encounter (Signed)
Tell the patient that the dark stools are likely due to the iron.  She can also have her hemoglobin checked if she is concerned.  The patient should take her pantoprazole in the evening if she is not already doing so.  If she is already doing so have her double up the Protonix for the next couple of days.

## 2017-11-15 NOTE — Telephone Encounter (Signed)
Pt called today stating she has starting having gas, severe reflux (wakes her up at night), vomiting (burns her esophagus) and black stools. She currently takes Pantoprazole 35m daily, but has added OTC alka selzer chews due to the increased reflux. She was also added to her medications Iron 3270m  No blood in stool, no fever, chills. I explained to her the OTC reflux chews and iron could be causing the dark stools. Please advise on reflux and dark stools.

## 2017-11-15 NOTE — Telephone Encounter (Signed)
Pt.notified

## 2017-11-25 ENCOUNTER — Encounter: Payer: Self-pay | Admitting: Family Medicine

## 2017-11-25 ENCOUNTER — Ambulatory Visit: Payer: Medicare HMO | Admitting: Family Medicine

## 2017-11-25 VITALS — BP 144/88 | HR 88 | Wt 152.0 lb

## 2017-11-25 DIAGNOSIS — K921 Melena: Secondary | ICD-10-CM

## 2017-11-25 DIAGNOSIS — E039 Hypothyroidism, unspecified: Secondary | ICD-10-CM | POA: Diagnosis not present

## 2017-11-25 DIAGNOSIS — D500 Iron deficiency anemia secondary to blood loss (chronic): Secondary | ICD-10-CM

## 2017-11-25 LAB — IFOBT (OCCULT BLOOD): IMMUNOLOGICAL FECAL OCCULT BLOOD TEST: POSITIVE

## 2017-11-25 NOTE — Patient Instructions (Signed)
July 1st 1:30 [place] Dr. Allen Norris

## 2017-11-25 NOTE — Progress Notes (Signed)
BP (!) 144/88   Pulse 88   Wt 152 lb (68.9 kg)   LMP 06/03/1972   SpO2 99%   BMI 30.70 kg/m    Subjective:    Patient ID: Joanna Rodriguez, female    DOB: March 19, 1951, 67 y.o.   MRN: [id number]  HPI: Joanna Rodriguez is a 67 y.o. female  Chief Complaint  Patient presents with  . Follow-up  . Dizziness    Worsening since surgery March 1st  . Medication Problem    Black stool since starting Iron   Sleeping better. Still very dizzy and tired. Having black stools. Has been feeling more dizzy. Not feeling well at all. Still quite tired.   RECTAL BLEEDING Duration: since starting the iron Bright red rectal bleeding: no  Amount of blood: unsure, melena  Frequency: Every bowel movement.  Melena: yes  Spotting on toilet tissue: no  Anal fullness: no  Perianal pain: no  Perianal irritation/itching: no  Constipation: no  Chronic straining/valsava:  no  Anal trauma/intercourse: no  Hemorrhoids: no  Previous colonoscopy: yes   HYPOTHYROIDISM Thyroid control status:unsure Satisfied with current treatment? unsure Medication side effects: no Medication compliance: excellent compliance Recent dose adjustment:yes Fatigue: yes Cold intolerance: no Heat intolerance: no Weight gain: no Weight loss: no Constipation: no Diarrhea/loose stools: no Palpitations: yes Lower extremity edema: no Anxiety/depressed mood: no  Relevant past medical, surgical, family and social history reviewed and updated as indicated. Interim medical history since our last visit reviewed. Allergies and medications reviewed and updated.  Review of Systems  Constitutional: Negative.   Respiratory: Negative.   Cardiovascular: Negative.   Gastrointestinal: Positive for anal bleeding and blood in stool. Negative for abdominal distention, abdominal pain, constipation, diarrhea, nausea, rectal pain and vomiting.  Skin: Positive for pallor. Negative for color change, rash and wound.    Neurological: Positive for dizziness and light-headedness. Negative for tremors, seizures, syncope, facial asymmetry, speech difficulty, weakness, numbness and headaches.  Psychiatric/Behavioral: Negative.     Per HPI unless specifically indicated above     Objective:    BP (!) 144/88   Pulse 88   Wt 152 lb (68.9 kg)   LMP 06/03/1972   SpO2 99%   BMI 30.70 kg/m   Wt Readings from Last 3 Encounters:  11/25/17 152 lb (68.9 kg)  10/27/17 150 lb (68 kg)  10/27/17 151 lb 2 oz (68.5 kg)    Physical Exam  Constitutional: She is oriented to person, place, and time. She appears well-developed and well-nourished. No distress.  HENT:  Head: Normocephalic and atraumatic.  Right Ear: Hearing normal.  Left Ear: Hearing normal.  Nose: Nose normal.  Eyes: Conjunctivae and lids are normal. Right eye exhibits no discharge. Left eye exhibits no discharge. No scleral icterus.  Cardiovascular: Normal rate, regular rhythm, normal heart sounds and intact distal pulses. Exam reveals no gallop and no friction rub.  No murmur heard. Pulmonary/Chest: Effort normal and breath sounds normal. No stridor. No respiratory distress. She has no wheezes. She has no rales. She exhibits no tenderness.  Abdominal: Soft. Bowel sounds are normal. She exhibits no distension and no mass. There is no tenderness. There is no rebound and no guarding. No hernia.  Genitourinary: Rectal exam shows guaiac positive stool.  Musculoskeletal: Normal range of motion.  Neurological: She is alert and oriented to person, place, and time.  Skin: Skin is warm, dry and intact. Capillary refill takes less than 2 seconds. No rash noted. She is not diaphoretic. No  erythema. There is pallor.  Psychiatric: She has a normal mood and affect. Her speech is normal and behavior is normal. Judgment and thought content normal. Cognition and memory are normal.  Nursing note and vitals reviewed.   Results for orders placed or performed in visit on  11/25/17  CBC with Differential/Platelet  Result Value Ref Range   WBC 5.4 3.4 - 10.8 x10E3/uL   RBC 3.02 (L) 3.77 - 5.28 x10E6/uL   Hemoglobin 9.6 (L) 11.1 - 15.9 g/dL   Hematocrit 30.3 (L) 34.0 - 46.6 %   MCV 100 (H) 79 - 97 fL   MCH 31.8 26.6 - 33.0 pg   MCHC 31.7 31.5 - 35.7 g/dL   RDW 17.4 (H) 12.3 - 15.4 %   Platelets 563 (H) 150 - 450 x10E3/uL   Neutrophils 60 Not Estab. %   Lymphs 32 Not Estab. %   Monocytes 6 Not Estab. %   Eos 1 Not Estab. %   Basos 1 Not Estab. %   Neutrophils Absolute 3.3 1.4 - 7.0 x10E3/uL   Lymphocytes Absolute 1.7 0.7 - 3.1 x10E3/uL   Monocytes Absolute 0.3 0.1 - 0.9 x10E3/uL   EOS (ABSOLUTE) 0.1 0.0 - 0.4 x10E3/uL   Basophils Absolute 0.1 0.0 - 0.2 x10E3/uL   Immature Granulocytes 0 Not Estab. %   Immature Grans (Abs) 0.0 0.0 - 0.1 x10E3/uL  TSH  Result Value Ref Range   TSH 7.010 (H) 0.450 - 4.500 uIU/mL  IFOBT POC (occult bld, rslt in office)  Result Value Ref Range   IFOBT Positive       Assessment & Plan:   Problem List Items Addressed This Visit      Digestive   Melena - Primary    +FOBT on exam today. Will get her back into see GI and await results of labs, if hgb down further, may need imaging to check on recent hemicolectomy. Monitor closely.      Relevant Orders   IFOBT POC (occult bld, rslt in office) (Completed)     Endocrine   Thyroid activity decreased    Will recheck levels today and adjust dose as needed. Await results.       Relevant Orders   TSH (Completed)     Other   Iron deficiency anemia due to chronic blood loss    Will recheck levels. Await results.       Relevant Orders   CBC with Differential/Platelet (Completed)       Follow up plan: Return if symptoms worsen or fail to improve.

## 2017-11-26 ENCOUNTER — Telehealth: Payer: Self-pay | Admitting: Family Medicine

## 2017-11-26 LAB — CBC WITH DIFFERENTIAL/PLATELET
BASOS ABS: 0.1 10*3/uL (ref 0.0–0.2)
Basos: 1 %
EOS (ABSOLUTE): 0.1 10*3/uL (ref 0.0–0.4)
Eos: 1 %
Hematocrit: 30.3 % — ABNORMAL LOW (ref 34.0–46.6)
Hemoglobin: 9.6 g/dL — ABNORMAL LOW (ref 11.1–15.9)
Immature Grans (Abs): 0 10*3/uL (ref 0.0–0.1)
Immature Granulocytes: 0 %
LYMPHS ABS: 1.7 10*3/uL (ref 0.7–3.1)
Lymphs: 32 %
MCH: 31.8 pg (ref 26.6–33.0)
MCHC: 31.7 g/dL (ref 31.5–35.7)
MCV: 100 fL — ABNORMAL HIGH (ref 79–97)
MONOS ABS: 0.3 10*3/uL (ref 0.1–0.9)
Monocytes: 6 %
Neutrophils Absolute: 3.3 10*3/uL (ref 1.4–7.0)
Neutrophils: 60 %
Platelets: 563 10*3/uL — ABNORMAL HIGH (ref 150–450)
RBC: 3.02 x10E6/uL — AB (ref 3.77–5.28)
RDW: 17.4 % — AB (ref 12.3–15.4)
WBC: 5.4 10*3/uL (ref 3.4–10.8)

## 2017-11-26 LAB — TSH: TSH: 7.01 u[IU]/mL — AB (ref 0.450–4.500)

## 2017-11-26 MED ORDER — FERROUS SULFATE 325 (65 FE) MG PO TABS: 325.0000 mg | ORAL_TABLET | Freq: Three times a day (TID) | ORAL | 3 refills | Status: DC

## 2017-11-26 MED ORDER — LEVOTHYROXINE SODIUM 125 MCG PO TABS: 125.0000 ug | ORAL_TABLET | Freq: Every day | ORAL | 3 refills | Status: DC

## 2017-11-26 NOTE — Telephone Encounter (Signed)
Called and spoke to Joanna Rodriguez- iron still low, thyroid still low. Will adjust medicine and recheck when she's back from her cruise.

## 2017-11-28 ENCOUNTER — Encounter: Payer: Self-pay | Admitting: Family Medicine

## 2017-11-28 DIAGNOSIS — K921 Melena: Secondary | ICD-10-CM | POA: Insufficient documentation

## 2017-11-28 DIAGNOSIS — D500 Iron deficiency anemia secondary to blood loss (chronic): Secondary | ICD-10-CM | POA: Insufficient documentation

## 2017-11-28 NOTE — Assessment & Plan Note (Signed)
+  FOBT on exam today. Will get her back into see GI and await results of labs, if hgb down further, may need imaging to check on recent hemicolectomy. Monitor closely.

## 2017-11-28 NOTE — Assessment & Plan Note (Signed)
Will recheck levels today and adjust dose as needed. Await results.

## 2017-11-28 NOTE — Assessment & Plan Note (Signed)
Will recheck levels. Await results.

## 2017-12-06 ENCOUNTER — Ambulatory Visit: Payer: Medicare HMO | Admitting: Gastroenterology

## 2017-12-27 ENCOUNTER — Other Ambulatory Visit: Payer: Self-pay

## 2017-12-27 ENCOUNTER — Ambulatory Visit (INDEPENDENT_AMBULATORY_CARE_PROVIDER_SITE_OTHER): Payer: Medicare HMO | Admitting: Gastroenterology

## 2017-12-27 ENCOUNTER — Encounter: Payer: Self-pay | Admitting: Gastroenterology

## 2017-12-27 VITALS — BP 137/70 | HR 98 | Ht 59.5 in | Wt 152.0 lb

## 2017-12-27 DIAGNOSIS — D500 Iron deficiency anemia secondary to blood loss (chronic): Secondary | ICD-10-CM

## 2017-12-27 DIAGNOSIS — R195 Other fecal abnormalities: Secondary | ICD-10-CM | POA: Diagnosis not present

## 2017-12-27 NOTE — Progress Notes (Signed)
Primary Care Physician: Valerie Roys, DO  Primary Gastroenterologist:  Dr. Lucilla Lame  Chief Complaint  Patient presents with  . Heme positive stool    HPI: Joanna Rodriguez is a 67 y.o. female here for the finding of heme positive stools.  The patient was also found to have anemia.  The patient's hemoglobin was 8.3 in May 1 and went up to 9.6 on May 30.  The patient was started on iron.  Back in February the patient's hemoglobin was 12.2 and normal.  The patient denies any abdominal pain but does states she has black stools when she moves her bowels but states they are solid and started when she started taking iron.  The patient has a history of a resection of a submucosal adenocarcinoma.  The patient also had multiple polyps at her last colonoscopy.  She has now been found to have heme positive stools and has been sent to me for evaluation.  Current Outpatient Medications  Medication Sig Dispense Refill  . acyclovir (ZOVIRAX) 400 MG tablet Take 1 tablet (400 mg total) by mouth daily. (Patient taking differently: Take 400 mg by mouth daily with lunch. ) 90 tablet 3  . albuterol (PROVENTIL HFA;VENTOLIN HFA) 108 (90 Base) MCG/ACT inhaler Inhale 2 puffs into the lungs every 6 (six) hours as needed for wheezing or shortness of breath. 1 Inhaler 2  . aspirin EC 81 MG tablet Take 81 mg by mouth daily.    Marland Kitchen buPROPion (WELLBUTRIN XL) 300 MG 24 hr tablet Take 1 tablet (300 mg total) by mouth daily. 90 tablet 1  . ferrous sulfate 325 (65 FE) MG tablet Take 1 tablet (325 mg total) by mouth 3 (three) times daily with meals. 90 tablet 3  . levothyroxine (SYNTHROID, LEVOTHROID) 125 MCG tablet Take 1 tablet (125 mcg total) by mouth daily. 30 tablet 3  . lisinopril (PRINIVIL,ZESTRIL) 5 MG tablet Take 1 tablet (5 mg total) by mouth daily. 90 tablet 1  . Melatonin 5 MG TABS Take 10 mg by mouth at bedtime as needed (for sleep.).     Marland Kitchen mercaptopurine (PURINETHOL) 50 MG tablet TAKE 1 AND 1/2 TABLETS  EVERY DAY 135 tablet 3  . Multiple Vitamins-Minerals (MULTIVITAMIN WITH MINERALS) tablet Take 1 tablet by mouth daily. One-A-Day for 50+    . pantoprazole (PROTONIX) 40 MG tablet Take 1 tablet (40 mg total) by mouth daily. (Patient taking differently: Take 40 mg by mouth 2 (two) times daily. ) 90 tablet 3  . traMADol (ULTRAM) 50 MG tablet Take 1 tablet (50 mg total) by mouth 3 (three) times daily. 270 tablet 2  . vitamin B-12 (CYANOCOBALAMIN) 1000 MCG tablet Take 1,000 mcg by mouth daily.     No current facility-administered medications for this visit.     Allergies as of 12/27/2017 - Review Complete 12/27/2017  Allergen Reaction Noted  . Nsaids  09/06/2014    ROS:  General: Negative for anorexia, weight loss, fever, chills, fatigue, weakness. ENT: Negative for hoarseness, difficulty swallowing , nasal congestion. CV: Negative for chest pain, angina, palpitations, dyspnea on exertion, peripheral edema.  Respiratory: Negative for dyspnea at rest, dyspnea on exertion, cough, sputum, wheezing.  GI: See history of present illness. GU:  Negative for dysuria, hematuria, urinary incontinence, urinary frequency, nocturnal urination.  Endo: Negative for unusual weight change.    Physical Examination:   BP 137/70   Pulse 98   Ht 4' 11.5" (1.511 m)   Wt 152 lb (68.9 kg)  LMP 06/03/1972   BMI 30.19 kg/m   General: Well-nourished, well-developed in no acute distress.  Eyes: No icterus. Conjunctivae pink. Mouth: Oropharyngeal mucosa moist and pink , no lesions erythema or exudate. Lungs: Clear to auscultation bilaterally. Non-labored. Heart: Regular rate and rhythm, no murmurs rubs or gallops.  Abdomen: Bowel sounds are normal, nontender, nondistended, no hepatosplenomegaly or masses, no abdominal bruits or hernia , no rebound or guarding.   Extremities: No lower extremity edema. No clubbing or deformities. Neuro: Alert and oriented x 3.  Grossly intact. Skin: Warm and dry, no jaundice.    Psych: Alert and cooperative, normal mood and affect.  Labs:    Imaging Studies: No results found.  Assessment and Plan:   Joanna Rodriguez is a 67 y.o. 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  The patient has had iron deficiency anemia and is now being sent to me for heme positive stools.  The patient will be set up for an EGD and colonoscopy to look for any source of bleeding and for her heme positive stools.  The patient has been explained the plan and agrees with it.    Lucilla Lame, MD. Marval Regal   Note: This dictation was prepared with Dragon dictation along with smaller phrase technology. Any transcriptional errors that result from this process are unintentional.

## 2018-01-03 ENCOUNTER — Ambulatory Visit: Payer: Medicare HMO | Admitting: Family Medicine

## 2018-01-05 NOTE — Discharge Instructions (Signed)
General Anesthesia, Adult, Care After °These instructions provide you with information about caring for yourself after your procedure. Your health care provider may also give you more specific instructions. Your treatment has been planned according to current medical practices, but problems sometimes occur. Call your health care provider if you have any problems or questions after your procedure. °What can I expect after the procedure? °After the procedure, it is common to have: °· Vomiting. °· A sore throat. °· Mental slowness. ° °It is common to feel: °· Nauseous. °· Cold or shivery. °· Sleepy. °· Tired. °· Sore or achy, even in parts of your body where you did not have surgery. ° °Follow these instructions at home: °For at least 24 hours after the procedure: °· Do not: °? Participate in activities where you could fall or become injured. °? Drive. °? Use heavy machinery. °? Drink alcohol. °? Take sleeping pills or medicines that cause drowsiness. °? Make important decisions or sign legal documents. °? Take care of children on your own. °· Rest. °Eating and drinking °· If you vomit, drink water, juice, or soup when you can drink without vomiting. °· Drink enough fluid to keep your urine clear or pale yellow. °· Make sure you have little or no nausea before eating solid foods. °· Follow the diet recommended by your health care provider. °General instructions °· Have a responsible adult stay with you until you are awake and alert. °· Return to your normal activities as told by your health care provider. Ask your health care provider what activities are safe for you. °· Take over-the-counter and prescription medicines only as told by your health care provider. °· If you smoke, do not smoke without supervision. °· Keep all follow-up visits as told by your health care provider. This is important. °Contact a health care provider if: °· You continue to have nausea or vomiting at home, and medicines are not helpful. °· You  cannot drink fluids or start eating again. °· You cannot urinate after 8-12 hours. °· You develop a skin rash. °· You have fever. °· You have increasing redness at the site of your procedure. °Get help right away if: °· You have difficulty breathing. °· You have chest pain. °· You have unexpected bleeding. °· You feel that you are having a life-threatening or urgent problem. °This information is not intended to replace advice given to you by your health care provider. Make sure you discuss any questions you have with your health care provider. °Document Released: 09/21/2000 Document Revised: 11/18/2015 Document Reviewed: 05/30/2015 °Elsevier Interactive Patient Education © 2018 [employer] ° °

## 2018-01-06 ENCOUNTER — Encounter
Admission: RE | Disposition: A | Discharge status: Home / Self Care | Payer: Self-pay | Source: Ambulatory Visit | Attending: Gastroenterology

## 2018-01-06 ENCOUNTER — Ambulatory Visit: Payer: Medicare HMO | Admitting: Anesthesiology

## 2018-01-06 ENCOUNTER — Ambulatory Visit
Admission: RE | Admit: 2018-01-06 | Discharge: 2018-01-06 | Disposition: A | Discharge status: Home / Self Care | Payer: Medicare HMO | Source: Ambulatory Visit | Attending: Gastroenterology | Admitting: Gastroenterology

## 2018-01-06 DIAGNOSIS — E039 Hypothyroidism, unspecified: Secondary | ICD-10-CM | POA: Diagnosis not present

## 2018-01-06 DIAGNOSIS — K219 Gastro-esophageal reflux disease without esophagitis: Secondary | ICD-10-CM | POA: Diagnosis not present

## 2018-01-06 DIAGNOSIS — Z886 Allergy status to analgesic agent status: Secondary | ICD-10-CM | POA: Insufficient documentation

## 2018-01-06 DIAGNOSIS — M1990 Unspecified osteoarthritis, unspecified site: Secondary | ICD-10-CM | POA: Diagnosis not present

## 2018-01-06 DIAGNOSIS — A6000 Herpesviral infection of urogenital system, unspecified: Secondary | ICD-10-CM | POA: Diagnosis not present

## 2018-01-06 DIAGNOSIS — M8580 Other specified disorders of bone density and structure, unspecified site: Secondary | ICD-10-CM | POA: Insufficient documentation

## 2018-01-06 DIAGNOSIS — M419 Scoliosis, unspecified: Secondary | ICD-10-CM | POA: Insufficient documentation

## 2018-01-06 DIAGNOSIS — Z7982 Long term (current) use of aspirin: Secondary | ICD-10-CM | POA: Diagnosis not present

## 2018-01-06 DIAGNOSIS — Z800 Family history of malignant neoplasm of digestive organs: Secondary | ICD-10-CM | POA: Diagnosis not present

## 2018-01-06 DIAGNOSIS — I10 Essential (primary) hypertension: Secondary | ICD-10-CM | POA: Insufficient documentation

## 2018-01-06 DIAGNOSIS — K635 Polyp of colon: Secondary | ICD-10-CM

## 2018-01-06 DIAGNOSIS — Z9049 Acquired absence of other specified parts of digestive tract: Secondary | ICD-10-CM | POA: Insufficient documentation

## 2018-01-06 DIAGNOSIS — Z87442 Personal history of urinary calculi: Secondary | ICD-10-CM | POA: Insufficient documentation

## 2018-01-06 DIAGNOSIS — R195 Other fecal abnormalities: Secondary | ICD-10-CM

## 2018-01-06 DIAGNOSIS — Z87891 Personal history of nicotine dependence: Secondary | ICD-10-CM | POA: Diagnosis not present

## 2018-01-06 DIAGNOSIS — D509 Iron deficiency anemia, unspecified: Secondary | ICD-10-CM | POA: Insufficient documentation

## 2018-01-06 DIAGNOSIS — K449 Diaphragmatic hernia without obstruction or gangrene: Secondary | ICD-10-CM | POA: Diagnosis not present

## 2018-01-06 DIAGNOSIS — K5730 Diverticulosis of large intestine without perforation or abscess without bleeding: Secondary | ICD-10-CM | POA: Insufficient documentation

## 2018-01-06 DIAGNOSIS — D500 Iron deficiency anemia secondary to blood loss (chronic): Secondary | ICD-10-CM

## 2018-01-06 DIAGNOSIS — J449 Chronic obstructive pulmonary disease, unspecified: Secondary | ICD-10-CM | POA: Diagnosis not present

## 2018-01-06 DIAGNOSIS — K228 Other specified diseases of esophagus: Secondary | ICD-10-CM

## 2018-01-06 DIAGNOSIS — Z980 Intestinal bypass and anastomosis status: Secondary | ICD-10-CM | POA: Insufficient documentation

## 2018-01-06 DIAGNOSIS — Z79899 Other long term (current) drug therapy: Secondary | ICD-10-CM | POA: Insufficient documentation

## 2018-01-06 DIAGNOSIS — K5090 Crohn's disease, unspecified, without complications: Secondary | ICD-10-CM | POA: Diagnosis not present

## 2018-01-06 DIAGNOSIS — Z8589 Personal history of malignant neoplasm of other organs and systems: Secondary | ICD-10-CM | POA: Insufficient documentation

## 2018-01-06 DIAGNOSIS — K640 First degree hemorrhoids: Secondary | ICD-10-CM | POA: Insufficient documentation

## 2018-01-06 DIAGNOSIS — D125 Benign neoplasm of sigmoid colon: Secondary | ICD-10-CM

## 2018-01-06 DIAGNOSIS — K3189 Other diseases of stomach and duodenum: Secondary | ICD-10-CM | POA: Insufficient documentation

## 2018-01-06 DIAGNOSIS — K2289 Other specified disease of esophagus: Secondary | ICD-10-CM

## 2018-01-06 HISTORY — PX: POLYPECTOMY: SHX149

## 2018-01-06 HISTORY — PX: ESOPHAGOGASTRODUODENOSCOPY (EGD) WITH PROPOFOL: SHX5813

## 2018-01-06 HISTORY — PX: COLONOSCOPY WITH PROPOFOL: SHX5780

## 2018-01-06 SURGERY — COLONOSCOPY WITH PROPOFOL
Anesthesia: General | Wound class: Contaminated

## 2018-01-06 MED ORDER — PROPOFOL 10 MG/ML IV BOLUS
INTRAVENOUS | Status: DC | PRN
  Administered 2018-01-06: 60 mg via INTRAVENOUS
  Administered 2018-01-06: 20 mg via INTRAVENOUS
  Administered 2018-01-06: 60 mg via INTRAVENOUS
  Administered 2018-01-06: 20 mg via INTRAVENOUS
  Administered 2018-01-06: 60 mg via INTRAVENOUS
  Administered 2018-01-06: 20 mg via INTRAVENOUS
  Administered 2018-01-06: 60 mg via INTRAVENOUS

## 2018-01-06 MED ORDER — SODIUM CHLORIDE 0.9 % IV SOLN: INTRAVENOUS | Status: DC

## 2018-01-06 MED ORDER — ACETAMINOPHEN 160 MG/5ML PO SOLN: 325.0000 mg | ORAL | Status: DC | PRN

## 2018-01-06 MED ORDER — LACTATED RINGERS IV SOLN
INTRAVENOUS | Status: DC
  Administered 2018-01-06 (×2): via INTRAVENOUS

## 2018-01-06 MED ORDER — GLYCOPYRROLATE 0.2 MG/ML IJ SOLN
INTRAMUSCULAR | Status: DC | PRN
  Administered 2018-01-06: 0.1 mg via INTRAVENOUS

## 2018-01-06 MED ORDER — ONDANSETRON HCL 4 MG/2ML IJ SOLN: 4.0000 mg | Freq: Once | INTRAMUSCULAR | Status: DC | PRN

## 2018-01-06 MED ORDER — ACETAMINOPHEN 325 MG PO TABS: 650.0000 mg | ORAL_TABLET | Freq: Once | ORAL | Status: DC | PRN

## 2018-01-06 MED ORDER — LIDOCAINE HCL (CARDIAC) PF 100 MG/5ML IV SOSY
PREFILLED_SYRINGE | INTRAVENOUS | Status: DC | PRN
  Administered 2018-01-06: 100 mg via INTRAVENOUS

## 2018-01-06 SURGICAL SUPPLY — 36 items
BALLN DILATOR 10-12 8 (BALLOONS)
BALLN DILATOR 12-15 8 (BALLOONS)
BALLN DILATOR 15-18 8 (BALLOONS)
BALLN DILATOR CRE 0-12 8 (BALLOONS)
BALLN DILATOR ESOPH 8 10 CRE (MISCELLANEOUS) IMPLANT
BALLOON DILATOR 12-15 8 (BALLOONS) IMPLANT
BALLOON DILATOR 15-18 8 (BALLOONS) IMPLANT
BALLOON DILATOR CRE 0-12 8 (BALLOONS) IMPLANT
BLOCK BITE 60FR ADLT L/F GRN (MISCELLANEOUS) ×3 IMPLANT
CANISTER SUCT 1200ML W/VALVE (MISCELLANEOUS) ×3 IMPLANT
CLIP HMST 235XBRD CATH ROT (MISCELLANEOUS) IMPLANT
CLIP RESOLUTION 360 11X235 (MISCELLANEOUS)
ELECT REM PT RETURN 9FT ADLT (ELECTROSURGICAL)
ELECTRODE REM PT RTRN 9FT ADLT (ELECTROSURGICAL) IMPLANT
FCP ESCP3.2XJMB 240X2.8X (MISCELLANEOUS)
FORCEPS BIOP RAD 4 LRG CAP 4 (CUTTING FORCEPS) ×3 IMPLANT
FORCEPS BIOP RJ4 240 W/NDL (MISCELLANEOUS)
FORCEPS ESCP3.2XJMB 240X2.8X (MISCELLANEOUS) IMPLANT
GOWN CVR UNV OPN BCK APRN NK (MISCELLANEOUS) ×4 IMPLANT
GOWN ISOL THUMB LOOP REG UNIV (MISCELLANEOUS) ×2
INJECTOR VARIJECT VIN23 (MISCELLANEOUS) IMPLANT
KIT DEFENDO VALVE AND CONN (KITS) IMPLANT
KIT ENDO PROCEDURE OLY (KITS) ×3 IMPLANT
MARKER SPOT ENDO TATTOO 5ML (MISCELLANEOUS) IMPLANT
PROBE APC STR FIRE (PROBE) IMPLANT
RETRIEVER NET PLAT FOOD (MISCELLANEOUS) IMPLANT
RETRIEVER NET ROTH 2.5X230 LF (MISCELLANEOUS) IMPLANT
SNARE SHORT THROW 13M SML OVAL (MISCELLANEOUS) IMPLANT
SNARE SHORT THROW 30M LRG OVAL (MISCELLANEOUS) IMPLANT
SNARE SNG USE RND 15MM (INSTRUMENTS) IMPLANT
SPOT EX ENDOSCOPIC TATTOO (MISCELLANEOUS)
SYR INFLATION 60ML (SYRINGE) IMPLANT
TRAP ETRAP POLY (MISCELLANEOUS) IMPLANT
VARIJECT INJECTOR VIN23 (MISCELLANEOUS)
WATER STERILE IRR 250ML POUR (IV SOLUTION) ×3 IMPLANT
WIRE CRE 18-20MM 8CM F G (MISCELLANEOUS) IMPLANT

## 2018-01-06 NOTE — Op Note (Signed)
[facility] Gastroenterology Patient Name: Joanna Rodriguez Procedure Date: 01/06/2018 9:42 AM MRN: [id number] Account #: [id number] Date of Birth: 04-07-1951 Admit Type: Outpatient Age: 67 Room: [facility] OR ROOM 01 Gender: Female Note Status: Finalized Procedure:            Upper GI endoscopy Indications:          Iron deficiency anemia, Heme positive stool Providers:            Lucilla Lame MD, MD Referring MD:         Valerie Roys (Referring MD) Medicines:            Propofol per Anesthesia Complications:        No immediate complications. Procedure:            Pre-Anesthesia Assessment:                       - Prior to the procedure, a History and Physical was                        performed, and patient medications and allergies were                        reviewed. The patient's tolerance of previous                        anesthesia was also reviewed. The risks and benefits of                        the procedure and the sedation options and risks were                        discussed with the patient. All questions were                        answered, and informed consent was obtained. Prior                        Anticoagulants: The patient has taken no previous                        anticoagulant or antiplatelet agents. ASA Grade                        Assessment: II - A patient with mild systemic disease.                        After reviewing the risks and benefits, the patient was                        deemed in satisfactory condition to undergo the                        procedure.                       After obtaining informed consent, the endoscope was                        passed under direct vision. Throughout the procedure,  the patient's blood pressure, pulse, and oxygen                        saturations were monitored continuously. The was                        introduced through the mouth, and advanced to the                   second part of duodenum. The upper GI endoscopy was                        accomplished without difficulty. The patient tolerated                        the procedure well. Findings:      A 6 cm hiatal hernia was present.      There were esophageal mucosal changes suspicious for long-segment       Barrett's esophagus present in the lower third of the esophagus. The       maximum longitudinal extent of these mucosal changes was 4 cm in length.       This was biopsied with a cold forceps for histology.      The stomach was normal.      The examined duodenum was normal. Impression:           - 6 cm hiatal hernia.                       - Esophageal mucosal changes suspicious for                        long-segment Barrett's esophagus. Biopsied.                       - Normal stomach.                       - Normal examined duodenum. Recommendation:       - Discharge patient to home.                       - Resume previous diet.                       - Continue present medications.                       - Perform a colonoscopy today. Procedure Code(s):    --- Professional ---                       [phone], Esophagogastroduodenoscopy, flexible, transoral;                        with biopsy, single or multiple Diagnosis Code(s):    --- Professional ---                       D50.9, Iron deficiency anemia, unspecified                       R19.5, Other fecal abnormalities  K22.8, Other specified diseases of esophagus CPT copyright 2017 [employer]. All rights reserved. The codes documented in this report are preliminary and upon coder review may  be revised to meet current compliance requirements. Lucilla Lame MD, MD 01/06/2018 10:28:25 AM This report has been signed electronically. Number of Addenda: 0 Note Initiated On: 01/06/2018 9:42 AM Total Procedure Duration: 0 hours 3 minutes 4 seconds       [facility]

## 2018-01-06 NOTE — Transfer of Care (Signed)
Immediate Anesthesia Transfer of Care Note  Patient: Joanna Rodriguez  Procedure(s) Performed: COLONOSCOPY WITH PROPOFOL (N/A ) ESOPHAGOGASTRODUODENOSCOPY (EGD) WITH PROPOFOL (N/A )  Patient Location: PACU  Anesthesia Type: General  Level of Consciousness: awake, alert  and patient cooperative  Airway and Oxygen Therapy: Patient Spontanous Breathing and Patient connected to supplemental oxygen  Post-op Assessment: Post-op Vital signs reviewed, Patient's Cardiovascular Status Stable, Respiratory Function Stable, Patent Airway and No signs of Nausea or vomiting  Post-op Vital Signs: Reviewed and stable  Complications: No apparent anesthesia complications

## 2018-01-06 NOTE — H&P (Signed)
Lucilla Lame, MD [address] [place], [place] [address] Phone:[phone] Fax : [phone]  Primary Care Physician:  Valerie Roys, DO Primary Gastroenterologist:  Dr. Allen Norris  Pre-Procedure History & Physical: HPI:  Joanna Rodriguez is a 66 y.o. female is here for an endoscopy and colonoscopy.   Past Medical History:  Diagnosis Date  . Arthritis   . Cancer [facility])    face  . COPD (chronic obstructive pulmonary disease) ([place])   . Crohn's disease ([place])    1989 ?  Marland Kitchen Dyspnea on exertion   . Genital herpes   . H/O pneumothorax    x 2  . History of kidney stones   . Hypertension   . Hypothyroid   . Kidney stones   . Osteopenia   . Personal history of tobacco use, presenting hazards to health 10/23/2015  . Scoliosis   . Spontaneous pneumothorax   . Vertigo    last episode 11/14/15  . [facility])     Past Surgical History:  Procedure Laterality Date  . ABDOMINAL HYSTERECTOMY    . APPENDECTOMY    . BREAST EXCISIONAL BIOPSY Bilateral 1990's   NEG  . CAPSULOTOMY METATARSOPHALANGEAL Left 03/19/2016   Procedure: CAPSULOTOMY METATARSOPHALANGEAL;  Surgeon: Albertine Patricia, DPM;  Location: [place];  Service: Podiatry;  Laterality: Left;  Second left toe.  Marland Kitchen COLONOSCOPY WITH PROPOFOL N/A 08/05/2017   Procedure: COLONOSCOPY WITH PROPOFOL;  Surgeon: Lucilla Lame, MD;  Location: [place];  Service: Endoscopy;  Laterality: N/A;  . ESOPHAGOGASTRODUODENOSCOPY (EGD) WITH PROPOFOL N/A 11/21/2015   Procedure: ESOPHAGOGASTRODUODENOSCOPY (EGD) WITH PROPOFOL with dialation;  Surgeon: Lucilla Lame, MD;  Location: [place];  Service: Endoscopy;  Laterality: N/A;  . FLEXOR TENDON REPAIR Left 03/19/2016   Procedure: FLEXOR TENDON release, percutaneous.;  Surgeon: Albertine Patricia, DPM;  Location: [place];  Service: Podiatry;  Laterality: Left;  4th left toe.  Marland Kitchen HAMMER TOE SURGERY Left 03/19/2016   Procedure: HAMMER TOE CORRECTION with  PIPjoint fusion;  Surgeon: Albertine Patricia, DPM;  Location: [place];  Service: Podiatry;  Laterality: Left;  Third left toe.  Marland Kitchen HAMMER TOE SURGERY Right 08/06/2016   Procedure: HAMMER TOE CORRECTION  RIGHT 2ND AND 3RD;  Surgeon: Albertine Patricia, DPM;  Location: [place];  Service: Podiatry;  Laterality: Right;  LMA with local Special Needs:  [place]  [employer] Needs to be 2nd patient per office  . LAPAROSCOPIC RIGHT COLECTOMY Right 08/27/2017   Procedure: LAPAROSCOPIC RIGHT COLECTOMY;  Surgeon: Robert Bellow, MD;  Location: [facility];  Service: General;  Laterality: Right;  . POLYPECTOMY  08/05/2017   Procedure: POLYPECTOMY;  Surgeon: Lucilla Lame, MD;  Location: [place];  Service: Endoscopy;;  . TALC PLEURODESIS  07/2014   left side  . THORACOSCOPY  07/2014   left side  . TUBAL LIGATION    . [place], with small bowel resection  . WEIL OSTEOTOMY Right 08/06/2016   Procedure: WEIL right 2nd & 3rd;  Surgeon: Albertine Patricia, DPM;  Location: [place];  Service: Podiatry;  Laterality: Right;    Prior to Admission medications   Medication Sig Start Date End Date Taking? Authorizing Provider  acyclovir (ZOVIRAX) 400 MG tablet Take 1 tablet (400 mg total) by mouth daily. Patient taking differently: Take 400 mg by mouth daily with lunch.  07/15/17  Yes Johnson, Megan P, DO  albuterol (PROVENTIL HFA;VENTOLIN HFA) 108 (90 Base) MCG/ACT inhaler Inhale  2 puffs into the lungs every 6 (six) hours as needed for wheezing or shortness of breath. 07/02/17  Yes Wilhelmina Mcardle, MD  buPROPion (WELLBUTRIN XL) 300 MG 24 hr tablet Take 1 tablet (300 mg total) by mouth daily. 10/27/17  Yes Johnson, Megan P, DO  ferrous sulfate 325 (65 FE) MG tablet Take 1 tablet (325 mg total) by mouth 3 (three) times daily with meals. 11/26/17  Yes Johnson, Megan P, DO  levothyroxine (SYNTHROID, LEVOTHROID) 125 MCG tablet Take 1 tablet (125 mcg total) by mouth daily.  11/26/17  Yes Johnson, Megan P, DO  lisinopril (PRINIVIL,ZESTRIL) 5 MG tablet Take 1 tablet (5 mg total) by mouth daily. 10/27/17  Yes Johnson, Megan P, DO  Melatonin 5 MG TABS Take 10 mg by mouth at bedtime as needed (for sleep.).  08/03/14  Yes [provider]  mercaptopurine (PURINETHOL) 50 MG tablet TAKE 1 AND 1/2 TABLETS EVERY DAY 09/21/17  Yes Lucilla Lame, MD  pantoprazole (PROTONIX) 40 MG tablet Take 1 tablet (40 mg total) by mouth daily. Patient taking differently: Take 40 mg by mouth 2 (two) times daily.  09/21/17  Yes Lucilla Lame, MD  traMADol (ULTRAM) 50 MG tablet Take 1 tablet (50 mg total) by mouth 3 (three) times daily. 09/21/17  Yes Lucilla Lame, MD  vitamin B-12 (CYANOCOBALAMIN) 1000 MCG tablet Take 1,000 mcg by mouth daily.   Yes [provider]  aspirin EC 81 MG tablet Take 81 mg by mouth daily.    [provider]  Multiple Vitamins-Minerals (MULTIVITAMIN WITH MINERALS) tablet Take 1 tablet by mouth daily. One-A-Day for 50+ 03/10/14   [provider]    Allergies as of 12/27/2017 - Review Complete 12/27/2017  Allergen Reaction Noted  . Nsaids  09/06/2014    Family History  Problem Relation Age of Onset  . Hypertension Mother   . Hypothyroidism Mother   . Alcohol abuse Mother   . Breast cancer Mother 74  . Hyperlipidemia Mother   . Mental illness Mother   . Heart attack Father   . Heart disease Father   . Glaucoma Father   . Colon cancer Father 27  . Diabetes Father   . Hypertension Father   . Heart disease Sister   . Hyperlipidemia Sister   . Hypertension Sister   . Lung disease Sister     Social History   Socioeconomic History  . Marital status: Divorced    Spouse name: Not on file  . Number of children: Not on file  . Years of education: Not on file  . Highest education level: Not on file  Occupational History  . Occupation: Press photographer    Comment: Sales force Rep - stocking\packing  Social Needs  . Financial resource strain:  Not hard at all  . Food insecurity:    Worry: Never true    Inability: Never true  . Transportation needs:    Medical: No    Non-medical: No  Tobacco Use  . Smoking status: Former Smoker    Packs/day: 1.50    Years: 53.00    Pack years: 79.50    Types: Cigarettes    Last attempt to quit: 08/22/2014    Years since quitting: 3.3  . Smokeless tobacco: Never Used  . Tobacco comment: NOV 16,2015  Substance and Sexual Activity  . Alcohol use: Yes    Alcohol/week: 0.0 oz    Comment: occasional - 2-3x/yr  . Drug use: No  . Sexual activity: Never  Lifestyle  .  Physical activity:    Days per week: 0 days    Minutes per session: 0 min  . Stress: Not at all  Relationships  . Social connections:    Talks on phone: More than three times a week    Gets together: More than three times a week    Attends religious service: Never    Active member of club or organization: Yes    Attends meetings of clubs or organizations: More than 4 times per year    Relationship status: Divorced  . Intimate partner violence:    Fear of current or ex partner: No    Emotionally abused: No    Physically abused: No    Forced sexual activity: No  Other Topics Concern  . Not on file  Social History Narrative   Volunteers at senior center     Review of Systems: See HPI, otherwise negative ROS  Physical Exam: BP (!) 146/70   Pulse (!) 109   Temp 97.9 F (36.6 C) (Temporal)   Resp 17   Ht 4' 11"  (1.499 m)   Wt 148 lb (67.1 kg)   LMP 06/03/1972   SpO2 98%   BMI 29.89 kg/m  General:   Alert,  pleasant and cooperative in NAD Head:  Normocephalic and atraumatic. Neck:  Supple; no masses or thyromegaly. Lungs:  Clear throughout to auscultation.    Heart:  Regular rate and rhythm. Abdomen:  Soft, nontender and nondistended. Normal bowel sounds, without guarding, and without rebound.   Neurologic:  Alert and  oriented x4;  grossly normal neurologically.  Impression/Plan: Joanna Rodriguez is  here for an endoscopy and colonoscopy to be performed for heme positive stools and anemia  Risks, benefits, limitations, and alternatives regarding  endoscopy and colonoscopy have been reviewed with the patient.  Questions have been answered.  All parties agreeable.   Lucilla Lame, MD  01/06/2018, 10:02 AM

## 2018-01-06 NOTE — Anesthesia Procedure Notes (Signed)
Procedure Name: MAC Date/Time: 01/06/2018 10:21 AM Performed by: Lind Guest, CRNA Pre-anesthesia Checklist: Patient identified, Emergency Drugs available, Suction available, Patient being monitored and Timeout performed Patient Re-evaluated:Patient Re-evaluated prior to induction Oxygen Delivery Method: Nasal cannula

## 2018-01-06 NOTE — Op Note (Signed)
[facility] Gastroenterology Patient Name: Joanna Rodriguez Procedure Date: 01/06/2018 10:28 AM MRN: [id number] Account #: [id number] Date of Birth: 17-Jul-1950 Admit Type: Outpatient Age: 67 Room: [facility] OR ROOM 01 Gender: Female Note Status: Finalized Procedure:            Colonoscopy Indications:          Heme positive stool, Iron deficiency anemia Providers:            Lucilla Lame MD, MD Referring MD:         Valerie Roys (Referring MD) Medicines:            Propofol per Anesthesia Complications:        No immediate complications. Procedure:            Pre-Anesthesia Assessment:                       - Prior to the procedure, a History and Physical was                        performed, and patient medications and allergies were                        reviewed. The patient's tolerance of previous                        anesthesia was also reviewed. The risks and benefits of                        the procedure and the sedation options and risks were                        discussed with the patient. All questions were                        answered, and informed consent was obtained. Prior                        Anticoagulants: The patient has taken no previous                        anticoagulant or antiplatelet agents. ASA Grade                        Assessment: II - A patient with mild systemic disease.                        After reviewing the risks and benefits, the patient was                        deemed in satisfactory condition to undergo the                        procedure.                       After obtaining informed consent, the colonoscope was                        passed under direct vision. Throughout the procedure,  the patient's blood pressure, pulse, and oxygen                        saturations were monitored continuously. The                        Colonoscope was introduced through the anus and         advanced to the the transverse colon to examine an                        anastomosis. This was the intended extent. The                        colonoscopy was performed without difficulty. The                        patient tolerated the procedure well. The quality of                        the bowel preparation was excellent. Findings:      The perianal and digital rectal examinations were normal.      A 5 mm polyp was found in the sigmoid colon. The polyp was sessile. The       polyp was removed with a cold biopsy forceps. Resection and retrieval       were complete.      A few small-mouthed diverticula were found in the sigmoid colon.      Non-bleeding internal hemorrhoids were found during retroflexion. The       hemorrhoids were Grade I (internal hemorrhoids that do not prolapse). Impression:           - One 5 mm polyp in the sigmoid colon, removed with a                        cold biopsy forceps. Resected and retrieved.                       - Diverticulosis in the sigmoid colon.                       - Non-bleeding internal hemorrhoids. Recommendation:       - Discharge patient to home.                       - Resume previous diet.                       - Continue present medications.                       - Await pathology results.                       - Repeat colonoscopy in 3 years for surveillance.                       - If anema presists the video capsule endoscopy. Procedure Code(s):    --- Professional ---                       [phone], 38, Colonoscopy, flexible; with biopsy, single  or multiple Diagnosis Code(s):    --- Professional ---                       D50.9, Iron deficiency anemia, unspecified                       R19.5, Other fecal abnormalities                       D12.5, Benign neoplasm of sigmoid colon CPT copyright 2017 American Medical Association. All rights reserved. The codes documented in this report are preliminary and  upon coder review may  be revised to meet current compliance requirements. Lucilla Lame MD, MD 01/06/2018 10:47:17 AM This report has been signed electronically. Number of Addenda: 0 Note Initiated On: 01/06/2018 10:28 AM Scope Withdrawal Time: 0 hours 5 minutes 35 seconds  Total Procedure Duration: 0 hours 10 minutes 5 seconds       [facility]

## 2018-01-06 NOTE — Anesthesia Postprocedure Evaluation (Signed)
Anesthesia Post Note  Patient: Joanna Rodriguez  Procedure(s) Performed: COLONOSCOPY WITH PROPOFOL (N/A ) ESOPHAGOGASTRODUODENOSCOPY (EGD) WITH biopsy. (N/A ) POLYPECTOMY INTESTINAL  Patient location during evaluation: PACU Anesthesia Type: General Level of consciousness: awake and alert, oriented and patient cooperative Pain management: pain level controlled Vital Signs Assessment: post-procedure vital signs reviewed and stable Respiratory status: spontaneous breathing, nonlabored ventilation and respiratory function stable Cardiovascular status: blood pressure returned to baseline and stable Postop Assessment: adequate PO intake Anesthetic complications: no    Darrin Nipper

## 2018-01-06 NOTE — Anesthesia Preprocedure Evaluation (Signed)
Anesthesia Evaluation  Patient identified by MRN, date of birth, ID band Patient awake    Reviewed: Allergy & Precautions, NPO status , Patient's Chart, lab work & pertinent test results  History of Anesthesia Complications Negative for: history of anesthetic complications  Airway Mallampati: I  TM Distance: >3 FB Neck ROM: Full    Dental  (+)    Pulmonary COPD, former smoker (quit smoking 2016),  Hx spontaneous PTX 2016; snoring   Pulmonary exam normal breath sounds clear to auscultation       Cardiovascular Exercise Tolerance: Good hypertension, Normal cardiovascular exam Rhythm:Regular Rate:Normal     Neuro/Psych Vertigo     GI/Hepatic Colon CA s/p colectomy; Crohn's   Endo/Other  Hypothyroidism   Renal/GU Renal disease (hx nephrolithiasis)     Musculoskeletal  (+) Arthritis , Osteoarthritis,    Abdominal   Peds  Hematology  (+) Blood dyscrasia, anemia ,   Anesthesia Other Findings   Reproductive/Obstetrics                             Anesthesia Physical Anesthesia Plan  ASA: II  Anesthesia Plan: General   Post-op Pain Management:    Induction: Intravenous  PONV Risk Score and Plan: 3 and Propofol infusion and TIVA  Airway Management Planned: Natural Airway  Additional Equipment:   Intra-op Plan:   Post-operative Plan:   Informed Consent: I have reviewed the patients History and Physical, chart, labs and discussed the procedure including the risks, benefits and alternatives for the proposed anesthesia with the patient or authorized representative who has indicated his/her understanding and acceptance.     Plan Discussed with: CRNA  Anesthesia Plan Comments:         Anesthesia Quick Evaluation

## 2018-01-07 ENCOUNTER — Encounter: Payer: Self-pay | Admitting: Gastroenterology

## 2018-01-10 ENCOUNTER — Telehealth: Payer: Self-pay | Admitting: Gastroenterology

## 2018-01-10 ENCOUNTER — Other Ambulatory Visit: Payer: Self-pay

## 2018-01-10 DIAGNOSIS — D500 Iron deficiency anemia secondary to blood loss (chronic): Secondary | ICD-10-CM

## 2018-01-10 NOTE — Telephone Encounter (Signed)
PT  Left vm she had procedure 7/11 and Dr. Allen Norris said something about Lab work she wants to make sure this was called in please call pt

## 2018-01-10 NOTE — Telephone Encounter (Signed)
Returned pt's call regarding the lab work Dr. Allen Norris discussed with pt after her procedures. CBC ordered today. Will move forward and schedule capsule study if still abnormal.

## 2018-01-11 ENCOUNTER — Encounter: Payer: Self-pay | Admitting: Gastroenterology

## 2018-01-11 LAB — CBC WITH DIFFERENTIAL/PLATELET
BASOS ABS: 0 10*3/uL (ref 0.0–0.2)
Basos: 1 %
EOS (ABSOLUTE): 0.1 10*3/uL (ref 0.0–0.4)
Eos: 1 %
HEMATOCRIT: 36.8 % (ref 34.0–46.6)
Hemoglobin: 12.2 g/dL (ref 11.1–15.9)
Immature Grans (Abs): 0 10*3/uL (ref 0.0–0.1)
Immature Granulocytes: 0 %
LYMPHS ABS: 1.3 10*3/uL (ref 0.7–3.1)
LYMPHS: 26 %
MCH: 32.8 pg (ref 26.6–33.0)
MCHC: 33.2 g/dL (ref 31.5–35.7)
MCV: 99 fL — ABNORMAL HIGH (ref 79–97)
MONOCYTES: 9 %
MONOS ABS: 0.5 10*3/uL (ref 0.1–0.9)
NEUTROS ABS: 3.3 10*3/uL (ref 1.4–7.0)
NEUTROS PCT: 63 %
Platelets: 390 10*3/uL (ref 150–450)
RBC: 3.72 x10E6/uL — ABNORMAL LOW (ref 3.77–5.28)
RDW: 15.7 % — AB (ref 12.3–15.4)
WBC: 5.2 10*3/uL (ref 3.4–10.8)

## 2018-01-14 ENCOUNTER — Telehealth: Payer: Self-pay

## 2018-01-14 NOTE — Telephone Encounter (Signed)
Pt scheduled for a follow up appt with Dr. Allen Norris on 01/27/18.

## 2018-01-14 NOTE — Telephone Encounter (Signed)
-----   Message from Lucilla Lame, MD sent at 01/12/2018  6:19 AM EDT ----- In addition to coming in for review of her esophageal pathology letter know that her Blood count has come back to normal.

## 2018-01-14 NOTE — Telephone Encounter (Signed)
-----   Message from Lucilla Lame, MD sent at 01/12/2018  6:09 AM EDT ----- Please have the patient come in for a follow up.

## 2018-01-17 ENCOUNTER — Other Ambulatory Visit: Payer: Self-pay

## 2018-01-18 ENCOUNTER — Telehealth: Payer: Self-pay | Admitting: Family Medicine

## 2018-01-18 NOTE — Telephone Encounter (Signed)
Forms printed for her to pick up.

## 2018-01-18 NOTE — Telephone Encounter (Signed)
Copied from [place] [id number]. Topic: General - Other >> Jan 17, 2018  9:58 AM Alfredia Ferguson R wrote: Pt called in and stated she misplaced her vertigo exercise papers that was given to her. If another copy can be made she will pick up on Wednesday .

## 2018-01-19 NOTE — Telephone Encounter (Signed)
Patient notified

## 2018-01-20 ENCOUNTER — Ambulatory Visit: Payer: Medicare HMO | Admitting: Family Medicine

## 2018-01-27 ENCOUNTER — Other Ambulatory Visit: Payer: Self-pay

## 2018-01-27 ENCOUNTER — Ambulatory Visit: Payer: Medicare HMO | Admitting: Gastroenterology

## 2018-01-27 ENCOUNTER — Encounter: Payer: Self-pay | Admitting: Gastroenterology

## 2018-01-27 VITALS — BP 142/73 | HR 91 | Ht 59.5 in | Wt 152.0 lb

## 2018-01-27 DIAGNOSIS — D500 Iron deficiency anemia secondary to blood loss (chronic): Secondary | ICD-10-CM

## 2018-01-27 DIAGNOSIS — K31A0 Gastric intestinal metaplasia, unspecified: Secondary | ICD-10-CM

## 2018-01-27 DIAGNOSIS — K3189 Other diseases of stomach and duodenum: Secondary | ICD-10-CM

## 2018-01-27 DIAGNOSIS — K22710 Barrett's esophagus with low grade dysplasia: Secondary | ICD-10-CM

## 2018-01-27 NOTE — Progress Notes (Signed)
Primary Care Physician: Valerie Roys, DO  Primary Gastroenterologist:  Dr. Lucilla Lame  Chief Complaint  Patient presents with  . Follow up procedure results    HPI: Joanna Rodriguez is a 67 y.o. female here for follow-up after having an EGD and anoscopy for iron deficiency anemia.  The patient's colonoscopy did not show worriesome lesions. There was some diverticulosis and a 5 mm polyp that was removed and found to be hyperplastic. The patient had an upper endoscopy at the same time that showed her to have intestinal metaplasia with low-grade dysplasia in the esophagus.  The patient had EGD and colonoscopy due to anemia.  The patient was put on INR and her blood count had come back to normal at the last blood draw.  She denies any abdominal pain nausea vomiting fevers or chills.  She does endorse having episodes of vertigo after being sedated.    Current Outpatient Medications  Medication Sig Dispense Refill  . acyclovir (ZOVIRAX) 400 MG tablet Take 1 tablet (400 mg total) by mouth daily. (Patient taking differently: Take 400 mg by mouth daily with lunch. ) 90 tablet 3  . albuterol (PROVENTIL HFA;VENTOLIN HFA) 108 (90 Base) MCG/ACT inhaler Inhale 2 puffs into the lungs every 6 (six) hours as needed for wheezing or shortness of breath. 1 Inhaler 2  . aspirin EC 81 MG tablet Take 81 mg by mouth daily.    Marland Kitchen buPROPion (WELLBUTRIN XL) 300 MG 24 hr tablet Take 1 tablet (300 mg total) by mouth daily. 90 tablet 1  . ferrous sulfate 325 (65 FE) MG tablet Take 1 tablet (325 mg total) by mouth 3 (three) times daily with meals. 90 tablet 3  . levothyroxine (SYNTHROID, LEVOTHROID) 125 MCG tablet Take 1 tablet (125 mcg total) by mouth daily. 30 tablet 3  . lisinopril (PRINIVIL,ZESTRIL) 5 MG tablet Take 1 tablet (5 mg total) by mouth daily. 90 tablet 1  . Melatonin 5 MG TABS Take 10 mg by mouth at bedtime as needed (for sleep.).     Marland Kitchen mercaptopurine (PURINETHOL) 50 MG tablet TAKE 1 AND 1/2  TABLETS EVERY DAY 135 tablet 3  . Multiple Vitamins-Minerals (MULTIVITAMIN WITH MINERALS) tablet Take 1 tablet by mouth daily. One-A-Day for 50+    . pantoprazole (PROTONIX) 40 MG tablet Take 1 tablet (40 mg total) by mouth daily. (Patient taking differently: Take 40 mg by mouth 2 (two) times daily. ) 90 tablet 3  . traMADol (ULTRAM) 50 MG tablet Take 1 tablet (50 mg total) by mouth 3 (three) times daily. 270 tablet 2  . vitamin B-12 (CYANOCOBALAMIN) 1000 MCG tablet Take 1,000 mcg by mouth daily.     No current facility-administered medications for this visit.     Allergies as of 01/27/2018 - Review Complete 01/27/2018  Allergen Reaction Noted  . Nsaids  09/06/2014    ROS:  General: Negative for anorexia, weight loss, fever, chills, fatigue, weakness. ENT: Negative for hoarseness, difficulty swallowing , nasal congestion. CV: Negative for chest pain, angina, palpitations, dyspnea on exertion, peripheral edema.  Respiratory: Negative for dyspnea at rest, dyspnea on exertion, cough, sputum, wheezing.  GI: See history of present illness. GU:  Negative for dysuria, hematuria, urinary incontinence, urinary frequency, nocturnal urination.  Endo: Negative for unusual weight change.    Physical Examination:   BP (!) 142/73   Pulse 91   Ht 4' 11.5" (1.511 m)   Wt 152 lb (68.9 kg)   LMP 06/03/1972   BMI 30.19  kg/m   General: Well-nourished, well-developed in no acute distress.  Eyes: No icterus. Conjunctivae pink. Mouth: Oropharyngeal mucosa moist and pink , no lesions erythema or exudate. Lungs: Clear to auscultation bilaterally. Non-labored. Heart: Regular rate and rhythm, no murmurs rubs or gallops.  Abdomen: Bowel sounds are normal, nontender, nondistended, no hepatosplenomegaly or masses, no abdominal bruits or hernia , no rebound or guarding.   Extremities: No lower extremity edema. No clubbing or deformities. Neuro: Alert and oriented x 3.  Grossly intact. Skin: Warm and dry,  no jaundice.   Psych: Alert and cooperative, normal mood and affect.  Labs:    Imaging Studies: No results found.  Assessment and Plan:   [name] is a 67 y.o. y/o female with a colonoscopy due to high risk surveillance showing a 5 mm hyperplastic polyp and diverticulosis.  Due to the patient's past history she should undergo a repeat colonoscopy in 3 years.  The patient's upper endoscopy showed her to have intestinal metaplasia in the esophagus with low-grade dysplasia.   The patient will be on double dose PPI with repeat EGD with biopsies of the distal esophagus in 6 weeks. The patient has been explained the plan and agrees with it   Lucilla Lame, MD. Marval Regal   Note: This dictation was prepared with Dragon dictation along with smaller phrase technology. Any transcriptional errors that result from this process are unintentional.

## 2018-01-28 DIAGNOSIS — R69 Illness, unspecified: Secondary | ICD-10-CM | POA: Diagnosis not present

## 2018-02-02 ENCOUNTER — Ambulatory Visit
Admission: RE | Admit: 2018-02-02 | Discharge: 2018-02-02 | Disposition: A | Discharge status: Home / Self Care | Payer: Medicare HMO | Source: Ambulatory Visit | Attending: Family Medicine | Admitting: Family Medicine

## 2018-02-02 DIAGNOSIS — Z1239 Encounter for other screening for malignant neoplasm of breast: Secondary | ICD-10-CM

## 2018-02-02 DIAGNOSIS — Z1231 Encounter for screening mammogram for malignant neoplasm of breast: Secondary | ICD-10-CM | POA: Diagnosis not present

## 2018-02-17 ENCOUNTER — Other Ambulatory Visit: Payer: Self-pay | Admitting: Physician Assistant

## 2018-02-17 ENCOUNTER — Other Ambulatory Visit: Payer: Medicare HMO

## 2018-02-17 DIAGNOSIS — E538 Deficiency of other specified B group vitamins: Secondary | ICD-10-CM | POA: Diagnosis not present

## 2018-02-18 LAB — CBC WITH DIFFERENTIAL/PLATELET
Basophils Absolute: 0 10*3/uL (ref 0.0–0.2)
Basos: 1 %
EOS (ABSOLUTE): 0.1 10*3/uL (ref 0.0–0.4)
Eos: 1 %
Hematocrit: 39 % (ref 34.0–46.6)
Hemoglobin: 13.4 g/dL (ref 11.1–15.9)
Immature Grans (Abs): 0 10*3/uL (ref 0.0–0.1)
Immature Granulocytes: 0 %
Lymphocytes Absolute: 1.7 10*3/uL (ref 0.7–3.1)
Lymphs: 32 %
MCH: 34.2 pg — ABNORMAL HIGH (ref 26.6–33.0)
MCHC: 34.4 g/dL (ref 31.5–35.7)
MCV: 100 fL — ABNORMAL HIGH (ref 79–97)
Monocytes Absolute: 0.4 10*3/uL (ref 0.1–0.9)
Monocytes: 7 %
Neutrophils Absolute: 3.2 10*3/uL (ref 1.4–7.0)
Neutrophils: 59 %
Platelets: 404 10*3/uL (ref 150–450)
RBC: 3.92 x10E6/uL (ref 3.77–5.28)
RDW: 15.2 % (ref 12.3–15.4)
WBC: 5.4 10*3/uL (ref 3.4–10.8)

## 2018-02-21 ENCOUNTER — Telehealth: Payer: Self-pay | Admitting: Gastroenterology

## 2018-02-21 ENCOUNTER — Other Ambulatory Visit: Payer: Self-pay | Admitting: Family Medicine

## 2018-02-21 DIAGNOSIS — E039 Hypothyroidism, unspecified: Secondary | ICD-10-CM

## 2018-02-21 NOTE — Telephone Encounter (Signed)
Needs blood work. Order in. Will refill pending results.

## 2018-02-21 NOTE — Telephone Encounter (Signed)
Lab appointment scheduled 

## 2018-02-21 NOTE — Telephone Encounter (Signed)
Pt left vm to speak to Ginger she states the pharmacy states her refill on rx pentrocole is to early to get refill they do not know  We increased the dosage she also would like to discuss Lab work. Please call pt

## 2018-02-22 ENCOUNTER — Telehealth: Payer: Self-pay | Admitting: Family Medicine

## 2018-02-22 ENCOUNTER — Other Ambulatory Visit: Payer: Medicare HMO

## 2018-02-22 ENCOUNTER — Other Ambulatory Visit: Payer: Self-pay

## 2018-02-22 DIAGNOSIS — E039 Hypothyroidism, unspecified: Secondary | ICD-10-CM | POA: Diagnosis not present

## 2018-02-22 MED ORDER — PANTOPRAZOLE SODIUM 40 MG PO TBEC: 40.0000 mg | DELAYED_RELEASE_TABLET | Freq: Two times a day (BID) | ORAL | 6 refills | Status: DC

## 2018-02-22 NOTE — Telephone Encounter (Signed)
Pt notified of results and to keep EGD appt with Wohl on 03/04/18. j  Refill for Pantoprazole has been sent to pt's pharmacy per her request.

## 2018-02-22 NOTE — Telephone Encounter (Signed)
-----   Message from Lucilla Lame, MD sent at 02/21/2018  5:39 PM EDT ----- The patient know that her hemoglobin and hematocrit are normal but she still needs to have her upper endoscopy 6 weeks after her last EGD.

## 2018-02-22 NOTE — Telephone Encounter (Signed)
Will send in refill tomorrow when we get the results.

## 2018-02-22 NOTE — Telephone Encounter (Signed)
Levothyroxine refill Last Refill:11/26/17 #90 Last OV: 11/25/17 PCP: [facility] Pharmacy:[facility]  Abnormal TSH 11/25/17

## 2018-02-22 NOTE — Telephone Encounter (Signed)
Pt is here now to get blood work done, so does this mean that she would have to wait for results?

## 2018-02-23 ENCOUNTER — Telehealth: Payer: Self-pay | Admitting: Family Medicine

## 2018-02-23 DIAGNOSIS — E039 Hypothyroidism, unspecified: Secondary | ICD-10-CM

## 2018-02-23 LAB — TSH: TSH: 0.063 u[IU]/mL — ABNORMAL LOW (ref 0.450–4.500)

## 2018-02-23 MED ORDER — LEVOTHYROXINE SODIUM 112 MCG PO TABS: 112.0000 ug | ORAL_TABLET | Freq: Every day | ORAL | 1 refills | Status: DC

## 2018-02-23 NOTE — Telephone Encounter (Signed)
Patient notified

## 2018-02-23 NOTE — Telephone Encounter (Signed)
Please let her know that her thyroid is slightly overtreated, so i'd like to drop her to 175mg and recheck in 6 weeks. THanks! Orders in.

## 2018-02-24 ENCOUNTER — Other Ambulatory Visit: Payer: Self-pay

## 2018-02-24 ENCOUNTER — Encounter: Payer: Self-pay | Admitting: *Deleted

## 2018-02-25 NOTE — Anesthesia Preprocedure Evaluation (Addendum)
Anesthesia Evaluation  Patient identified by MRN, date of birth, ID band Patient awake  General Assessment Comment:Reports vertigo associated with anesthetics.   Reviewed: Allergy & Precautions, H&P , NPO status , Patient's Chart, lab work & pertinent test results  History of Anesthesia Complications (+) history of anesthetic complications  Airway Mallampati: II  TM Distance: >3 FB Neck ROM: full    Dental no notable dental hx.    Pulmonary COPD, former smoker,    Pulmonary exam normal breath sounds clear to auscultation       Cardiovascular hypertension, + CAD  Normal cardiovascular exam Rhythm:regular Rate:Normal  Normal EKG 08/26/17   Neuro/Psych PSYCHIATRIC DISORDERS    GI/Hepatic   Endo/Other    Renal/GU      Musculoskeletal   Abdominal   Peds  Hematology   Anesthesia Other Findings   Reproductive/Obstetrics                            Anesthesia Physical Anesthesia Plan  ASA: II  Anesthesia Plan: General   Post-op Pain Management:    Induction: Intravenous  PONV Risk Score and Plan: 3 and Propofol infusion, Treatment may vary due to age or medical condition and Scopolamine patch - Pre-op  Airway Management Planned: Natural Airway  Additional Equipment:   Intra-op Plan:   Post-operative Plan:   Informed Consent: I have reviewed the patients History and Physical, chart, labs and discussed the procedure including the risks, benefits and alternatives for the proposed anesthesia with the patient or authorized representative who has indicated his/her understanding and acceptance.     Plan Discussed with: CRNA  Anesthesia Plan Comments:        Anesthesia Quick Evaluation                                  Anesthesia Evaluation  Patient identified by MRN, date of birth, ID band Patient awake    Reviewed: Allergy & Precautions, NPO status , Patient's Chart, lab work &  pertinent test results  History of Anesthesia Complications Negative for: history of anesthetic complications  Airway Mallampati: I  TM Distance: >3 FB Neck ROM: Full    Dental  (+)    Pulmonary COPD, former smoker (quit smoking 2016),  Hx spontaneous PTX 2016; snoring   Pulmonary exam normal breath sounds clear to auscultation       Cardiovascular Exercise Tolerance: Good hypertension, Normal cardiovascular exam Rhythm:Regular Rate:Normal     Neuro/Psych Vertigo     GI/Hepatic Colon CA s/p colectomy; Crohn's   Endo/Other  Hypothyroidism   Renal/GU Renal disease (hx nephrolithiasis)     Musculoskeletal  (+) Arthritis , Osteoarthritis,    Abdominal   Peds  Hematology  (+) Blood dyscrasia, anemia ,   Anesthesia Other Findings   Reproductive/Obstetrics                             Anesthesia Physical Anesthesia Plan  ASA: II  Anesthesia Plan: General   Post-op Pain Management:    Induction: Intravenous  PONV Risk Score and Plan: 3 and Propofol infusion and TIVA  Airway Management Planned: Natural Airway  Additional Equipment:   Intra-op Plan:   Post-operative Plan:   Informed Consent: I have reviewed the patients History and Physical, chart, labs and discussed the procedure including the risks, benefits and alternatives for  the proposed anesthesia with the patient or authorized representative who has indicated his/her understanding and acceptance.     Plan Discussed with: CRNA  Anesthesia Plan Comments:         Anesthesia Quick Evaluation

## 2018-03-02 ENCOUNTER — Encounter: Payer: Self-pay | Admitting: Pulmonary Disease

## 2018-03-02 ENCOUNTER — Ambulatory Visit: Payer: Medicare HMO | Admitting: Pulmonary Disease

## 2018-03-02 VITALS — BP 152/88 | HR 104 | Resp 16 | Ht 59.5 in | Wt 150.0 lb

## 2018-03-02 DIAGNOSIS — J449 Chronic obstructive pulmonary disease, unspecified: Secondary | ICD-10-CM | POA: Diagnosis not present

## 2018-03-02 DIAGNOSIS — R42 Dizziness and giddiness: Secondary | ICD-10-CM | POA: Diagnosis not present

## 2018-03-02 MED ORDER — ALBUTEROL SULFATE HFA 108 (90 BASE) MCG/ACT IN AERS: 2.0000 | INHALATION_SPRAY | Freq: Four times a day (QID) | RESPIRATORY_TRACT | 11 refills | Status: AC | PRN

## 2018-03-02 NOTE — Patient Instructions (Signed)
Continue albuterol inhaler as needed for increased wheezing, cough, shortness of breath, chest tightness  I have recommended that you seek further evaluation for vertigo under the direction of an otolaryngologist (ENT) or neurologist..  Communicate with Dr. Wynetta Emery.  Follow-up in 6 months.  Call sooner as needed

## 2018-03-02 NOTE — Discharge Instructions (Signed)
General Anesthesia, Adult, Care After °These instructions provide you with information about caring for yourself after your procedure. Your health care provider may also give you more specific instructions. Your treatment has been planned according to current medical practices, but problems sometimes occur. Call your health care provider if you have any problems or questions after your procedure. °What can I expect after the procedure? °After the procedure, it is common to have: °· Vomiting. °· A sore throat. °· Mental slowness. ° °It is common to feel: °· Nauseous. °· Cold or shivery. °· Sleepy. °· Tired. °· Sore or achy, even in parts of your body where you did not have surgery. ° °Follow these instructions at home: °For at least 24 hours after the procedure: °· Do not: °? Participate in activities where you could fall or become injured. °? Drive. °? Use heavy machinery. °? Drink alcohol. °? Take sleeping pills or medicines that cause drowsiness. °? Make important decisions or sign legal documents. °? Take care of children on your own. °· Rest. °Eating and drinking °· If you vomit, drink water, juice, or soup when you can drink without vomiting. °· Drink enough fluid to keep your urine clear or pale yellow. °· Make sure you have little or no nausea before eating solid foods. °· Follow the diet recommended by your health care provider. °General instructions °· Have a responsible adult stay with you until you are awake and alert. °· Return to your normal activities as told by your health care provider. Ask your health care provider what activities are safe for you. °· Take over-the-counter and prescription medicines only as told by your health care provider. °· If you smoke, do not smoke without supervision. °· Keep all follow-up visits as told by your health care provider. This is important. °Contact a health care provider if: °· You continue to have nausea or vomiting at home, and medicines are not helpful. °· You  cannot drink fluids or start eating again. °· You cannot urinate after 8-12 hours. °· You develop a skin rash. °· You have fever. °· You have increasing redness at the site of your procedure. °Get help right away if: °· You have difficulty breathing. °· You have chest pain. °· You have unexpected bleeding. °· You feel that you are having a life-threatening or urgent problem. °This information is not intended to replace advice given to you by your health care provider. Make sure you discuss any questions you have with your health care provider. °Document Released: 09/21/2000 Document Revised: 11/18/2015 Document Reviewed: 05/30/2015 °Elsevier Interactive Patient Education © 2018 [employer] ° °

## 2018-03-03 ENCOUNTER — Ambulatory Visit
Admission: RE | Admit: 2018-03-03 | Discharge: 2018-03-03 | Disposition: A | Discharge status: Home / Self Care | Payer: Medicare HMO | Source: Ambulatory Visit | Attending: Gastroenterology | Admitting: Gastroenterology

## 2018-03-03 ENCOUNTER — Ambulatory Visit: Payer: Medicare HMO | Admitting: Anesthesiology

## 2018-03-03 ENCOUNTER — Encounter
Admission: RE | Disposition: A | Discharge status: Home / Self Care | Payer: Self-pay | Source: Ambulatory Visit | Attending: Gastroenterology

## 2018-03-03 DIAGNOSIS — Z8719 Personal history of other diseases of the digestive system: Secondary | ICD-10-CM | POA: Insufficient documentation

## 2018-03-03 DIAGNOSIS — I2510 Atherosclerotic heart disease of native coronary artery without angina pectoris: Secondary | ICD-10-CM | POA: Insufficient documentation

## 2018-03-03 DIAGNOSIS — Z79890 Hormone replacement therapy (postmenopausal): Secondary | ICD-10-CM | POA: Diagnosis not present

## 2018-03-03 DIAGNOSIS — M1990 Unspecified osteoarthritis, unspecified site: Secondary | ICD-10-CM | POA: Insufficient documentation

## 2018-03-03 DIAGNOSIS — K22710 Barrett's esophagus with low grade dysplasia: Secondary | ICD-10-CM | POA: Insufficient documentation

## 2018-03-03 DIAGNOSIS — K2930 Chronic superficial gastritis without bleeding: Secondary | ICD-10-CM | POA: Diagnosis not present

## 2018-03-03 DIAGNOSIS — K2900 Acute gastritis without bleeding: Secondary | ICD-10-CM | POA: Diagnosis not present

## 2018-03-03 DIAGNOSIS — K3189 Other diseases of stomach and duodenum: Secondary | ICD-10-CM | POA: Insufficient documentation

## 2018-03-03 DIAGNOSIS — Z79899 Other long term (current) drug therapy: Secondary | ICD-10-CM | POA: Diagnosis not present

## 2018-03-03 DIAGNOSIS — Z7982 Long term (current) use of aspirin: Secondary | ICD-10-CM | POA: Diagnosis not present

## 2018-03-03 DIAGNOSIS — Z87891 Personal history of nicotine dependence: Secondary | ICD-10-CM | POA: Diagnosis not present

## 2018-03-03 DIAGNOSIS — E039 Hypothyroidism, unspecified: Secondary | ICD-10-CM | POA: Diagnosis not present

## 2018-03-03 DIAGNOSIS — K31A0 Gastric intestinal metaplasia, unspecified: Secondary | ICD-10-CM

## 2018-03-03 DIAGNOSIS — J449 Chronic obstructive pulmonary disease, unspecified: Secondary | ICD-10-CM | POA: Diagnosis not present

## 2018-03-03 DIAGNOSIS — I10 Essential (primary) hypertension: Secondary | ICD-10-CM | POA: Diagnosis not present

## 2018-03-03 DIAGNOSIS — K2270 Barrett's esophagus without dysplasia: Secondary | ICD-10-CM | POA: Diagnosis not present

## 2018-03-03 HISTORY — PX: ESOPHAGOGASTRODUODENOSCOPY (EGD) WITH PROPOFOL: SHX5813

## 2018-03-03 HISTORY — DX: Other complications of anesthesia, initial encounter: T88.59XA

## 2018-03-03 HISTORY — DX: Adverse effect of unspecified anesthetic, initial encounter: T41.45XA

## 2018-03-03 SURGERY — ESOPHAGOGASTRODUODENOSCOPY (EGD) WITH PROPOFOL
Anesthesia: General | Wound class: Clean Contaminated

## 2018-03-03 MED ORDER — ACETAMINOPHEN 160 MG/5ML PO SOLN: 325.0000 mg | Freq: Once | ORAL | Status: DC

## 2018-03-03 MED ORDER — SODIUM CHLORIDE 0.9 % IV SOLN: INTRAVENOUS | Status: DC

## 2018-03-03 MED ORDER — LACTATED RINGERS IV SOLN
INTRAVENOUS | Status: DC
  Administered 2018-03-03: 08:00:00 via INTRAVENOUS

## 2018-03-03 MED ORDER — LIDOCAINE HCL (CARDIAC) PF 100 MG/5ML IV SOSY
PREFILLED_SYRINGE | INTRAVENOUS | Status: DC | PRN
  Administered 2018-03-03: 40 mg via INTRAVENOUS

## 2018-03-03 MED ORDER — SCOPOLAMINE 1 MG/3DAYS TD PT72
1.0000 | MEDICATED_PATCH | TRANSDERMAL | Status: DC
  Administered 2018-03-03: 1.5 mg via TRANSDERMAL

## 2018-03-03 MED ORDER — ACETAMINOPHEN 325 MG PO TABS: 325.0000 mg | ORAL_TABLET | Freq: Once | ORAL | Status: DC

## 2018-03-03 MED ORDER — DEXMEDETOMIDINE HCL 200 MCG/2ML IV SOLN
INTRAVENOUS | Status: DC | PRN
  Administered 2018-03-03: 6 ug via INTRAVENOUS

## 2018-03-03 MED ORDER — PROPOFOL 10 MG/ML IV BOLUS
INTRAVENOUS | Status: DC | PRN
  Administered 2018-03-03 (×2): 20 mg via INTRAVENOUS
  Administered 2018-03-03: 100 mg via INTRAVENOUS

## 2018-03-03 MED ORDER — GLYCOPYRROLATE 0.2 MG/ML IJ SOLN
INTRAMUSCULAR | Status: DC | PRN
  Administered 2018-03-03: 0.2 mg via INTRAVENOUS

## 2018-03-03 SURGICAL SUPPLY — 33 items
BALLN DILATOR 10-12 8 (BALLOONS)
BALLN DILATOR 12-15 8 (BALLOONS)
BALLN DILATOR 15-18 8 (BALLOONS)
BALLN DILATOR CRE 0-12 8 (BALLOONS)
BALLN DILATOR ESOPH 8 10 CRE (MISCELLANEOUS) IMPLANT
BALLOON DILATOR 12-15 8 (BALLOONS) IMPLANT
BALLOON DILATOR 15-18 8 (BALLOONS) IMPLANT
BALLOON DILATOR CRE 0-12 8 (BALLOONS) IMPLANT
BLOCK BITE 60FR ADLT L/F GRN (MISCELLANEOUS) ×2 IMPLANT
CANISTER SUCT 1200ML W/VALVE (MISCELLANEOUS) ×2 IMPLANT
CLIP HMST 235XBRD CATH ROT (MISCELLANEOUS) IMPLANT
CLIP RESOLUTION 360 11X235 (MISCELLANEOUS)
ELECT REM PT RETURN 9FT ADLT (ELECTROSURGICAL)
ELECTRODE REM PT RTRN 9FT ADLT (ELECTROSURGICAL) IMPLANT
FCP ESCP3.2XJMB 240X2.8X (MISCELLANEOUS)
FORCEPS BIOP RAD 4 LRG CAP 4 (CUTTING FORCEPS) ×2 IMPLANT
FORCEPS BIOP RJ4 240 W/NDL (MISCELLANEOUS)
FORCEPS ESCP3.2XJMB 240X2.8X (MISCELLANEOUS) IMPLANT
GOWN CVR UNV OPN BCK APRN NK (MISCELLANEOUS) ×2 IMPLANT
GOWN ISOL THUMB LOOP REG UNIV (MISCELLANEOUS) ×2
INJECTOR VARIJECT VIN23 (MISCELLANEOUS) IMPLANT
KIT DEFENDO VALVE AND CONN (KITS) IMPLANT
KIT ENDO PROCEDURE OLY (KITS) ×2 IMPLANT
MARKER SPOT ENDO TATTOO 5ML (MISCELLANEOUS) IMPLANT
RETRIEVER NET PLAT FOOD (MISCELLANEOUS) IMPLANT
SNARE SHORT THROW 13M SML OVAL (MISCELLANEOUS) IMPLANT
SNARE SHORT THROW 30M LRG OVAL (MISCELLANEOUS) IMPLANT
SPOT EX ENDOSCOPIC TATTOO (MISCELLANEOUS)
SYR INFLATION 60ML (SYRINGE) IMPLANT
TRAP ETRAP POLY (MISCELLANEOUS) IMPLANT
VARIJECT INJECTOR VIN23 (MISCELLANEOUS)
WATER STERILE IRR 250ML POUR (IV SOLUTION) ×2 IMPLANT
WIRE CRE 18-20MM 8CM F G (MISCELLANEOUS) IMPLANT

## 2018-03-03 NOTE — Anesthesia Procedure Notes (Signed)
Procedure Name: MAC Date/Time: 03/03/2018 8:51 AM Performed by: Janna Arch, CRNA Pre-anesthesia Checklist: Patient identified, Emergency Drugs available, Suction available, Timeout performed and Patient being monitored Patient Re-evaluated:Patient Re-evaluated prior to induction Oxygen Delivery Method: Nasal cannula Placement Confirmation: positive ETCO2

## 2018-03-03 NOTE — Transfer of Care (Signed)
Immediate Anesthesia Transfer of Care Note  Patient: Joanna Rodriguez  Procedure(s) Performed: ESOPHAGOGASTRODUODENOSCOPY (EGD) WITH PROPOFOL with biopsies (N/A )  Patient Location: PACU  Anesthesia Type: General  Level of Consciousness: awake, alert  and patient cooperative  Airway and Oxygen Therapy: Patient Spontanous Breathing and Patient connected to supplemental oxygen  Post-op Assessment: Post-op Vital signs reviewed, Patient's Cardiovascular Status Stable, Respiratory Function Stable, Patent Airway and No signs of Nausea or vomiting  Post-op Vital Signs: Reviewed and stable  Complications: No apparent anesthesia complications

## 2018-03-03 NOTE — Anesthesia Postprocedure Evaluation (Signed)
Anesthesia Post Note  Patient: Joanna Rodriguez  Procedure(s) Performed: ESOPHAGOGASTRODUODENOSCOPY (EGD) WITH PROPOFOL with biopsies (N/A )  Patient location during evaluation: PACU Anesthesia Type: General Level of consciousness: awake and alert and oriented Pain management: satisfactory to patient Vital Signs Assessment: post-procedure vital signs reviewed and stable Respiratory status: spontaneous breathing, nonlabored ventilation and respiratory function stable Cardiovascular status: blood pressure returned to baseline and stable Postop Assessment: Adequate PO intake and No signs of nausea or vomiting Anesthetic complications: no    [name]

## 2018-03-03 NOTE — Op Note (Signed)
[facility] Gastroenterology Patient Name: Joanna Rodriguez Procedure Date: 03/03/2018 8:43 AM MRN: [id number] Account #: [id number] Date of Birth: 1950/09/15 Admit Type: Inpatient Age: 67 Room: [facility] OR ROOM 01 Gender: Female Note Status: Finalized Procedure:            Upper GI endoscopy Indications:          Barrett's low grade dysplasia Providers:            Lucilla Lame MD, MD Referring MD:         Valerie Roys (Referring MD) Medicines:            Propofol per Anesthesia Complications:        No immediate complications. Procedure:            Pre-Anesthesia Assessment:                       - Prior to the procedure, a History and Physical was                        performed, and patient medications and allergies were                        reviewed. The patient's tolerance of previous                        anesthesia was also reviewed. The risks and benefits of                        the procedure and the sedation options and risks were                        discussed with the patient. All questions were                        answered, and informed consent was obtained. Prior                        Anticoagulants: The patient has taken no previous                        anticoagulant or antiplatelet agents. ASA Grade                        Assessment: II - A patient with mild systemic disease.                        After reviewing the risks and benefits, the patient was                        deemed in satisfactory condition to undergo the                        procedure.                       After obtaining informed consent, the endoscope was                        passed under direct vision. Throughout the procedure,  the patient's blood pressure, pulse, and oxygen                        saturations were monitored continuously. The was                        introduced through the mouth, and advanced to the   second part of duodenum. The upper GI endoscopy was                        accomplished without difficulty. The patient tolerated                        the procedure well. Findings:      A small hiatal hernia was present.      There were esophageal mucosal changes secondary to established       long-segment Barrett's disease present in the lower third of the       esophagus. The maximum longitudinal extent of these mucosal changes was       3 cm in length. Mucosa was biopsied with a cold forceps for histology in       a targeted manner in the lower third of the esophagus.      Patchy mildly erythematous mucosa without bleeding was found in the       gastric antrum. Biopsies were taken with a cold forceps for histology.      The examined duodenum was normal. Impression:           - Small hiatal hernia.                       - Esophageal mucosal changes secondary to established                        long-segment Barrett's disease. Biopsied.                       - Erythematous mucosa in the antrum. Biopsied.                       - Normal examined duodenum. Recommendation:       - Discharge patient to home.                       - Resume previous diet.                       - Continue present medications.                       - Await pathology results. Procedure Code(s):    --- Professional ---                       [phone], Esophagogastroduodenoscopy, flexible, transoral;                        with biopsy, single or multiple Diagnosis Code(s):    --- Professional ---                       K22.710, Barrett's esophagus with low grade dysplasia  K31.89, Other diseases of stomach and duodenum CPT copyright 2017 American Medical Association. All rights reserved. The codes documented in this report are preliminary and upon coder review may  be revised to meet current compliance requirements. Lucilla Lame MD, MD 03/03/2018 9:06:56 AM This report has been signed  electronically. Number of Addenda: 0 Note Initiated On: 03/03/2018 8:43 AM Total Procedure Duration: 0 hours 8 minutes 23 seconds       [facility]

## 2018-03-03 NOTE — H&P (Signed)
Lucilla Lame, MD [address] [place], [place] [address] Phone:[phone] Fax : [phone]  Primary Care Physician:  Valerie Roys, DO Primary Gastroenterologist:  Dr. Allen Norris  Pre-Procedure History & Physical: HPI:  Joanna Rodriguez is a 67 y.o. female is here for an endoscopy.   Past Medical History:  Diagnosis Date  . Arthritis   . Cancer [facility])    face  . Complication of anesthesia    all anesthesia events in 2019 have worsened vertigo issues  . COPD (chronic obstructive pulmonary disease) ([place])   . Crohn's disease ([place])    1989 ?  Marland Kitchen Dyspnea on exertion   . Genital herpes   . H/O pneumothorax    x 2  . History of kidney stones   . Hypertension   . Hypothyroid   . Kidney stones   . Osteopenia   . Personal history of tobacco use, presenting hazards to health 10/23/2015  . Scoliosis   . Spontaneous pneumothorax   . Vertigo       . Volvulus [facility])     Past Surgical History:  Procedure Laterality Date  . ABDOMINAL HYSTERECTOMY    . APPENDECTOMY    . BREAST EXCISIONAL BIOPSY Bilateral 1990's   NEG  . CAPSULOTOMY METATARSOPHALANGEAL Left 03/19/2016   Procedure: CAPSULOTOMY METATARSOPHALANGEAL;  Surgeon: Albertine Patricia, DPM;  Location: [facility];  Service: Podiatry;  Laterality: Left;  Second left toe.  Marland Kitchen COLONOSCOPY WITH PROPOFOL N/A 08/05/2017   Procedure: COLONOSCOPY WITH PROPOFOL;  Surgeon: Lucilla Lame, MD;  Location: [place];  Service: Endoscopy;  Laterality: N/A;  . COLONOSCOPY WITH PROPOFOL N/A 01/06/2018   Procedure: COLONOSCOPY WITH PROPOFOL;  Surgeon: Lucilla Lame, MD;  Location: [place];  Service: Endoscopy;  Laterality: N/A;  . ESOPHAGOGASTRODUODENOSCOPY (EGD) WITH PROPOFOL N/A 11/21/2015   Procedure: ESOPHAGOGASTRODUODENOSCOPY (EGD) WITH PROPOFOL with dialation;  Surgeon: Lucilla Lame, MD;  Location: [place];  Service: Endoscopy;  Laterality: N/A;  . ESOPHAGOGASTRODUODENOSCOPY (EGD) WITH  PROPOFOL N/A 01/06/2018   Procedure: ESOPHAGOGASTRODUODENOSCOPY (EGD) WITH biopsy.;  Surgeon: Lucilla Lame, MD;  Location: [place];  Service: Endoscopy;  Laterality: N/A;  . FLEXOR TENDON REPAIR Left 03/19/2016   Procedure: FLEXOR TENDON release, percutaneous.;  Surgeon: Albertine Patricia, DPM;  Location: [place];  Service: Podiatry;  Laterality: Left;  4th left toe.  Marland Kitchen HAMMER TOE SURGERY Left 03/19/2016   Procedure: HAMMER TOE CORRECTION with PIPjoint fusion;  Surgeon: Albertine Patricia, DPM;  Location: [place];  Service: Podiatry;  Laterality: Left;  Third left toe.  Marland Kitchen HAMMER TOE SURGERY Right 08/06/2016   Procedure: HAMMER TOE CORRECTION  RIGHT 2ND AND 3RD;  Surgeon: Albertine Patricia, DPM;  Location: [place];  Service: Podiatry;  Laterality: Right;  LMA with local Special Needs:  [place]  [employer] Needs to be 2nd patient per office  . LAPAROSCOPIC RIGHT COLECTOMY Right 08/27/2017   Procedure: LAPAROSCOPIC RIGHT COLECTOMY;  Surgeon: Robert Bellow, MD;  Location: [facility];  Service: General;  Laterality: Right;  . POLYPECTOMY  08/05/2017   Procedure: POLYPECTOMY;  Surgeon: Lucilla Lame, MD;  Location: [place];  Service: Endoscopy;;  . POLYPECTOMY  01/06/2018   Procedure: POLYPECTOMY INTESTINAL;  Surgeon: Lucilla Lame, MD;  Location: [place];  Service: Endoscopy;;  . TALC PLEURODESIS  07/2014   left side  . THORACOSCOPY  07/2014   left side  . TUBAL LIGATION    . [place],  with small bowel resection  . WEIL OSTEOTOMY Right 08/06/2016   Procedure: WEIL right 2nd & 3rd;  Surgeon: Albertine Patricia, DPM;  Location: [place];  Service: Podiatry;  Laterality: Right;    Prior to Admission medications   Medication Sig Start Date End Date Taking? Authorizing Provider  acyclovir (ZOVIRAX) 400 MG tablet Take 1 tablet (400 mg total) by mouth daily. Patient taking differently: Take 400 mg by mouth daily  with lunch.  07/15/17  Yes Johnson, Megan P, DO  albuterol (VENTOLIN HFA) 108 (90 Base) MCG/ACT inhaler Inhale 2 puffs into the lungs every 6 (six) hours as needed for wheezing or shortness of breath. 03/02/18  Yes Wilhelmina Mcardle, MD  aspirin EC 81 MG tablet Take 81 mg by mouth daily.   Yes [provider]  buPROPion (WELLBUTRIN XL) 300 MG 24 hr tablet Take 1 tablet (300 mg total) by mouth daily. 10/27/17  Yes Johnson, Megan P, DO  levothyroxine (SYNTHROID, LEVOTHROID) 112 MCG tablet Take 1 tablet (112 mcg total) by mouth daily. 02/23/18  Yes Johnson, Megan P, DO  lisinopril (PRINIVIL,ZESTRIL) 5 MG tablet Take 1 tablet (5 mg total) by mouth daily. 10/27/17  Yes Johnson, Megan P, DO  Melatonin 5 MG TABS Take 10 mg by mouth at bedtime as needed (for sleep.).  08/03/14  Yes [provider]  mercaptopurine (PURINETHOL) 50 MG tablet TAKE 1 AND 1/2 TABLETS EVERY DAY 09/21/17  Yes Lucilla Lame, MD  Multiple Vitamins-Minerals (MULTIVITAMIN WITH MINERALS) tablet Take 1 tablet by mouth daily. One-A-Day for 50+ 03/10/14  Yes [provider]  pantoprazole (PROTONIX) 40 MG tablet Take 1 tablet (40 mg total) by mouth 2 (two) times daily. 02/22/18  Yes Lucilla Lame, MD  traMADol (ULTRAM) 50 MG tablet Take 1 tablet (50 mg total) by mouth 3 (three) times daily. 09/21/17  Yes Lucilla Lame, MD  vitamin B-12 (CYANOCOBALAMIN) 1000 MCG tablet Take 1,000 mcg by mouth daily.   Yes [provider]    Allergies as of 01/27/2018 - Review Complete 01/27/2018  Allergen Reaction Noted  . Nsaids  09/06/2014    Family History  Problem Relation Age of Onset  . Hypertension Mother   . Hypothyroidism Mother   . Alcohol abuse Mother   . Breast cancer Mother 6  . Hyperlipidemia Mother   . Mental illness Mother   . Heart attack Father   . Heart disease Father   . Glaucoma Father   . Colon cancer Father 32  . Diabetes Father   . Hypertension Father   . Heart disease Sister   . Hyperlipidemia  Sister   . Hypertension Sister   . Lung disease Sister     Social History   Socioeconomic History  . Marital status: Divorced    Spouse name: Not on file  . Number of children: Not on file  . Years of education: Not on file  . Highest education level: Not on file  Occupational History  . Occupation: Press photographer    Comment: Sales force Rep - stocking\packing  Social Needs  . Financial resource strain: Not hard at all  . Food insecurity:    Worry: Never true    Inability: Never true  . Transportation needs:    Medical: No    Non-medical: No  Tobacco Use  . Smoking status: Former Smoker    Packs/day: 1.50    Years: 53.00    Pack years: 79.50    Types: Cigarettes    Last attempt to  quit: 08/22/2014    Years since quitting: 3.5  . Smokeless tobacco: Never Used  . Tobacco comment: NOV 16,2015  Substance and Sexual Activity  . Alcohol use: Yes    Alcohol/week: 0.0 standard drinks    Comment: occasional - 2-3x/yr  . Drug use: No  . Sexual activity: Never  Lifestyle  . Physical activity:    Days per week: 0 days    Minutes per session: 0 min  . Stress: Not at all  Relationships  . Social connections:    Talks on phone: More than three times a week    Gets together: More than three times a week    Attends religious service: Never    Active member of club or organization: Yes    Attends meetings of clubs or organizations: More than 4 times per year    Relationship status: Divorced  . Intimate partner violence:    Fear of current or ex partner: No    Emotionally abused: No    Physically abused: No    Forced sexual activity: No  Other Topics Concern  . Not on file  Social History Narrative   Volunteers at senior center     Review of Systems: See HPI, otherwise negative ROS  Physical Exam: BP (!) 155/93   Pulse 99   Temp 98.1 F (36.7 C) (Temporal)   Resp 16   Ht 4' 11"  (1.499 m)   Wt 67.6 kg   LMP 06/03/1972   SpO2 99%   BMI 30.09 kg/m  General:   Alert,   pleasant and cooperative in NAD Head:  Normocephalic and atraumatic. Neck:  Supple; no masses or thyromegaly. Lungs:  Clear throughout to auscultation.    Heart:  Regular rate and rhythm. Abdomen:  Soft, nontender and nondistended. Normal bowel sounds, without guarding, and without rebound.   Neurologic:  Alert and  oriented x4;  grossly normal neurologically.  Impression/Plan: Joanna Rodriguez is here for an endoscopy to be performed for gastric intestinal metaplasia  Risks, benefits, limitations, and alternatives regarding  endoscopy have been reviewed with the patient.  Questions have been answered.  All parties agreeable.   Lucilla Lame, MD  03/03/2018, 8:13 AM

## 2018-03-04 ENCOUNTER — Encounter: Payer: Self-pay | Admitting: Pulmonary Disease

## 2018-03-04 NOTE — Progress Notes (Signed)
PULMONARY OFFICE FOLLOW UP NOTE  PROBLEMS:  Former smoker Moderate COPD History of spontaneous left PTX X 2 - s/p L pleurodesis   DATA: PFTs 10/23/14: moderate obstruction, FEV1 1.30 L (58% pred), normal lung volumes, normal DLCO LDCT 10/25/15: moderate emphysema, sequelae from prior pleurodesis, no suspicious nodules LDCT 10/26/16: No worrisome or new findings  INTERVAL HISTORY: Last seen 07/02/2017.  No major pulmonary events.  In March of this year, she underwent colon cancer resection with a hemicolectomy.  SUBJ: This is a scheduled follow-up.  After her surgery in March, she resumed Spiriva inhaler for approximately 3 weeks which she felt was modestly beneficial.  However, now she is off of it and cannot discern any worsening of her symptoms.  She rarely uses albuterol inhaler.  Her chief complaint today is true vertigo which has become almost disabling.  She is far more limited by vertigo than dyspnea.  She denies CP, fever, purulent sputum, hemoptysis, LE edema and calf tenderness.  OBJ: Vitals:   03/02/18 1006 03/02/18 1009  BP:  (!) 152/88  Pulse:  (!) 104  Resp: 16   SpO2:  97%  Weight: 150 lb (68 kg)   Height: 4' 11.5" (1.511 m)   Room air  Gen: NAD HEENT: NCAT, sclera white Neck: No JVD Lungs: breath sounds mildly diminished, no wheezes or other adventitious sounds Cardiovascular: RRR, no murmurs Abdomen: Soft, nontender, normal BS Ext: without clubbing, cyanosis, edema Neuro: grossly intact Skin: Limited exam, no lesions noted   DATA: BMP Latest Ref Rng & Units 10/27/2017 08/29/2017 08/26/2017  Glucose 65 - 99 mg/dL 113(H) 98 88  BUN 8 - 27 mg/dL 18 13 19   Creatinine 0.57 - 1.00 mg/dL 0.85 0.76 0.91  BUN/Creat Ratio 12 - 28 21 - -  Sodium 134 - 144 mmol/L 138 139 139  Potassium 3.5 - 5.2 mmol/L 4.7 3.9 4.3  Chloride 96 - 106 mmol/L 99 103 103  CO2 20 - 29 mmol/L 23 26 27   Calcium 8.7 - 10.3 mg/dL 9.8 8.8(L) 9.2   CBC Latest Ref Rng & Units 02/17/2018 01/10/2018  11/25/2017  WBC 3.4 - 10.8 x10E3/uL 5.4 5.2 5.4  Hemoglobin 11.1 - 15.9 g/dL 13.4 12.2 9.6(L)  Hematocrit 34.0 - 46.6 % 39.0 36.8 30.3(L)  Platelets 150 - 450 x10E3/uL 404 390 563(H)   CXR: No new film  IMPRESSION: COPD, moderate (HCC)  Vertigo    PLAN: Continue albuterol inhaler as needed for increased shortness of breath, chest tightness, wheezing, cough  I recommended that she seek further evaluation for vertigo by a neurologist or otolaryngologist.  She is to discuss this further with her primary care physician.  Follow-up in 6 months.  Call sooner if needed   Merton Border, MD PCCM service Mobile [phone] Pager [phone] 03/04/2018 9:55 AM

## 2018-03-10 ENCOUNTER — Encounter: Payer: Self-pay | Admitting: Gastroenterology

## 2018-03-14 ENCOUNTER — Ambulatory Visit (INDEPENDENT_AMBULATORY_CARE_PROVIDER_SITE_OTHER): Payer: Medicare HMO

## 2018-03-14 ENCOUNTER — Telehealth: Payer: Self-pay

## 2018-03-14 ENCOUNTER — Encounter: Payer: Self-pay | Admitting: Gastroenterology

## 2018-03-14 VITALS — BP 138/68 | HR 89 | Temp 97.7°F

## 2018-03-14 DIAGNOSIS — Z23 Encounter for immunization: Secondary | ICD-10-CM

## 2018-03-14 NOTE — Patient Instructions (Signed)

## 2018-03-14 NOTE — Telephone Encounter (Signed)
-----   Message from Lucilla Lame, MD sent at 03/14/2018  2:24 PM EDT ----- Please have the patient come in for a follow up.

## 2018-03-17 ENCOUNTER — Other Ambulatory Visit: Payer: Self-pay | Admitting: Family Medicine

## 2018-03-28 ENCOUNTER — Ambulatory Visit: Payer: Medicare HMO | Admitting: Gastroenterology

## 2018-03-28 ENCOUNTER — Encounter: Payer: Self-pay | Admitting: Gastroenterology

## 2018-03-28 VITALS — BP 167/88 | HR 97 | Ht 59.5 in | Wt 150.5 lb

## 2018-03-28 DIAGNOSIS — K22710 Barrett's esophagus with low grade dysplasia: Secondary | ICD-10-CM

## 2018-03-28 NOTE — Progress Notes (Signed)
Primary Care Physician: Valerie Roys, DO  Primary Gastroenterologist:  Dr. Lucilla Lame  Chief Complaint  Patient presents with  . Follow up EGD results    HPI: Joanna Rodriguez is a 67 y.o. female here for follow-up after an EGD.  The patient had the second EGD done because of a finding of low-grade dysplasia on the first EGD.  The patient's most recent EGD again showed her to have Barrett's esophagus with low-grade dysplasia.  There are no focal nodular areas or suspicious lesions seen with narrowband imaging.  The patient has been optimized on her PPI.  She is now here for follow-up of her pathology results and to discuss further options.  Current Outpatient Medications  Medication Sig Dispense Refill  . acyclovir (ZOVIRAX) 400 MG tablet Take 1 tablet (400 mg total) by mouth daily. (Patient taking differently: Take 400 mg by mouth daily with lunch. ) 90 tablet 3  . albuterol (VENTOLIN HFA) 108 (90 Base) MCG/ACT inhaler Inhale 2 puffs into the lungs every 6 (six) hours as needed for wheezing or shortness of breath. 18 g 11  . aspirin EC 81 MG tablet Take 81 mg by mouth daily.    Marland Kitchen buPROPion (WELLBUTRIN XL) 300 MG 24 hr tablet Take 1 tablet (300 mg total) by mouth daily. 90 tablet 1  . levothyroxine (SYNTHROID, LEVOTHROID) 112 MCG tablet TAKE 1 TABLET BY MOUTH EVERY DAY 30 tablet 1  . lisinopril (PRINIVIL,ZESTRIL) 5 MG tablet Take 1 tablet (5 mg total) by mouth daily. 90 tablet 1  . Melatonin 5 MG TABS Take 10 mg by mouth at bedtime as needed (for sleep.).     Marland Kitchen mercaptopurine (PURINETHOL) 50 MG tablet TAKE 1 AND 1/2 TABLETS EVERY DAY 135 tablet 3  . Multiple Vitamins-Minerals (MULTIVITAMIN WITH MINERALS) tablet Take 1 tablet by mouth daily. One-A-Day for 50+    . pantoprazole (PROTONIX) 40 MG tablet Take 1 tablet (40 mg total) by mouth 2 (two) times daily. 60 tablet 6  . Tiotropium Bromide Monohydrate (SPIRIVA RESPIMAT) 2.5 MCG/ACT AERS Inhale into the lungs.    . traMADol  (ULTRAM) 50 MG tablet Take 1 tablet (50 mg total) by mouth 3 (three) times daily. 270 tablet 2  . vitamin B-12 (CYANOCOBALAMIN) 1000 MCG tablet Take 1,000 mcg by mouth daily.    Marland Kitchen levothyroxine (SYNTHROID, LEVOTHROID) 88 MCG tablet Take 88 mcg by mouth daily.  3   No current facility-administered medications for this visit.     Allergies as of 03/28/2018 - Review Complete 03/28/2018  Allergen Reaction Noted  . Nsaids  09/06/2014    ROS:  General: Negative for anorexia, weight loss, fever, chills, fatigue, weakness. ENT: Negative for hoarseness, difficulty swallowing , nasal congestion. CV: Negative for chest pain, angina, palpitations, dyspnea on exertion, peripheral edema.  Respiratory: Negative for dyspnea at rest, dyspnea on exertion, cough, sputum, wheezing.  GI: See history of present illness. GU:  Negative for dysuria, hematuria, urinary incontinence, urinary frequency, nocturnal urination.  Endo: Negative for unusual weight change.    Physical Examination:   BP (!) 167/88   Pulse 97   Ht 4' 11.5" (1.511 m)   Wt 150 lb 8 oz (68.3 kg)   LMP 06/03/1972   BMI 29.89 kg/m   General: Well-nourished, well-developed in no acute distress.  Eyes: No icterus. Conjunctivae pink. Mouth: Oropharyngeal mucosa moist and pink , no lesions erythema or exudate. Lungs: Clear to auscultation bilaterally. Non-labored. Heart: Regular rate and rhythm, no murmurs rubs  or gallops.  Abdomen: Bowel sounds are normal, nontender, nondistended, no hepatosplenomegaly or masses, no abdominal bruits or hernia , no rebound or guarding.   Extremities: No lower extremity edema. No clubbing or deformities. Neuro: Alert and oriented x 3.  Grossly intact. Skin: Warm and dry, no jaundice.   Psych: Alert and cooperative, normal mood and affect.  Labs:    Imaging Studies: No results found.  Assessment and Plan:   Joanna Rodriguez is a 67 y.o. y/o female who comes in today with a history of 2 recent  upper endoscopies that showed her to have low-grade dysplasia.  The patient has been given the option of continued surveillance every 1 to 2 years versus ablation.  The patient states that she would like to be considered for ablation therapy.  The patient will be sent to [facility] for evaluation for possible Barrx treatment.  The patient has been explained the plan and agrees with it.    Lucilla Lame, MD. Marval Regal   Note: This dictation was prepared with Dragon dictation along with smaller phrase technology. Any transcriptional errors that result from this process are unintentional.

## 2018-04-06 ENCOUNTER — Other Ambulatory Visit: Payer: Medicare Other

## 2018-04-06 DIAGNOSIS — E039 Hypothyroidism, unspecified: Secondary | ICD-10-CM

## 2018-04-07 ENCOUNTER — Telehealth: Payer: Self-pay | Admitting: Family Medicine

## 2018-04-07 DIAGNOSIS — E039 Hypothyroidism, unspecified: Secondary | ICD-10-CM

## 2018-04-07 LAB — TSH: TSH: 0.093 u[IU]/mL — AB (ref 0.450–4.500)

## 2018-04-07 MED ORDER — LEVOTHYROXINE SODIUM 100 MCG PO TABS: 100.0000 ug | ORAL_TABLET | Freq: Every day | ORAL | 2 refills | Status: DC

## 2018-04-07 NOTE — Telephone Encounter (Signed)
Please let her know that her thyroid is a little better, but still over treated, I'd like her to decrease her synthroid to 143mg and we'll recheck in 6 weeks. Order in the computer. Rx to pharmacy.

## 2018-04-07 NOTE — Telephone Encounter (Signed)
Message relayed to patient. Verbalized understanding and denied questions.   

## 2018-04-10 ENCOUNTER — Other Ambulatory Visit: Payer: Self-pay | Admitting: Family Medicine

## 2018-04-19 ENCOUNTER — Other Ambulatory Visit: Payer: Self-pay | Admitting: Family Medicine

## 2018-04-25 ENCOUNTER — Telehealth: Payer: Self-pay | Admitting: Gastroenterology

## 2018-04-25 NOTE — Telephone Encounter (Signed)
Pt notified she is being referred to [facility] GI department. Gave pt the phone number to contact them to schedule appt.

## 2018-04-25 NOTE — Telephone Encounter (Signed)
Pt left vm for Ginger to see if she had any luck on an apt for [facility]

## 2018-04-26 DIAGNOSIS — K219 Gastro-esophageal reflux disease without esophagitis: Secondary | ICD-10-CM | POA: Diagnosis not present

## 2018-04-26 DIAGNOSIS — K22719 Barrett's esophagus with dysplasia, unspecified: Secondary | ICD-10-CM | POA: Diagnosis not present

## 2018-05-02 ENCOUNTER — Telehealth: Payer: Self-pay | Admitting: Family Medicine

## 2018-05-02 DIAGNOSIS — G8929 Other chronic pain: Secondary | ICD-10-CM | POA: Diagnosis not present

## 2018-05-02 DIAGNOSIS — M545 Low back pain: Secondary | ICD-10-CM | POA: Diagnosis not present

## 2018-05-02 DIAGNOSIS — M549 Dorsalgia, unspecified: Principal | ICD-10-CM

## 2018-05-02 DIAGNOSIS — M5136 Other intervertebral disc degeneration, lumbar region: Secondary | ICD-10-CM | POA: Diagnosis not present

## 2018-05-02 DIAGNOSIS — M5416 Radiculopathy, lumbar region: Secondary | ICD-10-CM | POA: Diagnosis not present

## 2018-05-02 DIAGNOSIS — M5442 Lumbago with sciatica, left side: Secondary | ICD-10-CM | POA: Diagnosis not present

## 2018-05-02 DIAGNOSIS — M5441 Lumbago with sciatica, right side: Secondary | ICD-10-CM | POA: Diagnosis not present

## 2018-05-02 NOTE — Telephone Encounter (Signed)
Patient was seen today at [facility] for back pain but her insurance changed on Friday requiring the PCP to do a ref but the patient had an appointment today without the ref. Caryl Pina from physiatry states the patient was being seen before they realized. I told her I did not know if there was anything that could be done about it but I would let you know.  Just FYI  Thanks

## 2018-05-02 NOTE — Telephone Encounter (Signed)
Urgent referral generated. Please let referral coordinators know (I'm sorry) and please find out who she saw so we can try to help- if we get it done before end of day it may work.

## 2018-05-02 NOTE — Telephone Encounter (Signed)
Joanna Rodriguez aware and working on referral now.

## 2018-05-04 ENCOUNTER — Telehealth: Payer: Self-pay | Admitting: Gastroenterology

## 2018-05-04 NOTE — Telephone Encounter (Signed)
Pt left vm for Ginger she had her apt with [facility] and they have scheduled her procedure she would like a call please

## 2018-05-04 NOTE — Telephone Encounter (Signed)
Pt just wanted to let me know her procedure with [facility] is scheduled for 05/30/18.

## 2018-05-17 NOTE — Telephone Encounter (Signed)
[facility] called in, requesting another referral for the pt to be seen with Dr. Sharlet Salina for her lower back pain. I was advised they did receive a referral before, but it was only for one day.

## 2018-05-23 ENCOUNTER — Other Ambulatory Visit: Payer: Self-pay | Admitting: Physical Medicine and Rehabilitation

## 2018-05-23 DIAGNOSIS — M5442 Lumbago with sciatica, left side: Principal | ICD-10-CM

## 2018-05-23 DIAGNOSIS — G8929 Other chronic pain: Secondary | ICD-10-CM | POA: Diagnosis not present

## 2018-05-23 DIAGNOSIS — M5441 Lumbago with sciatica, right side: Principal | ICD-10-CM

## 2018-05-23 DIAGNOSIS — M5416 Radiculopathy, lumbar region: Secondary | ICD-10-CM | POA: Diagnosis not present

## 2018-05-23 DIAGNOSIS — M5136 Other intervertebral disc degeneration, lumbar region: Secondary | ICD-10-CM | POA: Diagnosis not present

## 2018-05-30 DIAGNOSIS — I10 Essential (primary) hypertension: Secondary | ICD-10-CM | POA: Diagnosis not present

## 2018-05-30 DIAGNOSIS — E039 Hypothyroidism, unspecified: Secondary | ICD-10-CM | POA: Diagnosis not present

## 2018-05-30 DIAGNOSIS — K22711 Barrett's esophagus with high grade dysplasia: Secondary | ICD-10-CM | POA: Diagnosis not present

## 2018-05-30 DIAGNOSIS — Z87891 Personal history of nicotine dependence: Secondary | ICD-10-CM | POA: Diagnosis not present

## 2018-05-30 DIAGNOSIS — Z79899 Other long term (current) drug therapy: Secondary | ICD-10-CM | POA: Diagnosis not present

## 2018-05-30 DIAGNOSIS — J449 Chronic obstructive pulmonary disease, unspecified: Secondary | ICD-10-CM | POA: Diagnosis not present

## 2018-05-30 DIAGNOSIS — K449 Diaphragmatic hernia without obstruction or gangrene: Secondary | ICD-10-CM | POA: Diagnosis not present

## 2018-06-07 ENCOUNTER — Other Ambulatory Visit: Payer: Self-pay | Admitting: Family Medicine

## 2018-06-08 NOTE — Telephone Encounter (Signed)
Left message to call office and make appointment. Courtesy refill.

## 2018-06-11 ENCOUNTER — Ambulatory Visit
Admission: RE | Admit: 2018-06-11 | Discharge: 2018-06-11 | Disposition: A | Discharge status: Home / Self Care | Payer: Medicare Other | Source: Ambulatory Visit | Attending: Physical Medicine and Rehabilitation | Admitting: Physical Medicine and Rehabilitation

## 2018-06-11 DIAGNOSIS — M5116 Intervertebral disc disorders with radiculopathy, lumbar region: Secondary | ICD-10-CM | POA: Insufficient documentation

## 2018-06-11 DIAGNOSIS — M5442 Lumbago with sciatica, left side: Secondary | ICD-10-CM

## 2018-06-11 DIAGNOSIS — G8929 Other chronic pain: Secondary | ICD-10-CM | POA: Diagnosis not present

## 2018-06-11 DIAGNOSIS — M419 Scoliosis, unspecified: Secondary | ICD-10-CM | POA: Diagnosis not present

## 2018-06-11 DIAGNOSIS — M5441 Lumbago with sciatica, right side: Secondary | ICD-10-CM

## 2018-06-11 DIAGNOSIS — M48061 Spinal stenosis, lumbar region without neurogenic claudication: Secondary | ICD-10-CM | POA: Diagnosis not present

## 2018-06-11 DIAGNOSIS — M545 Low back pain: Secondary | ICD-10-CM | POA: Diagnosis not present

## 2018-06-14 DIAGNOSIS — M5136 Other intervertebral disc degeneration, lumbar region: Secondary | ICD-10-CM | POA: Diagnosis not present

## 2018-06-14 DIAGNOSIS — M5416 Radiculopathy, lumbar region: Secondary | ICD-10-CM | POA: Diagnosis not present

## 2018-06-17 DIAGNOSIS — M25612 Stiffness of left shoulder, not elsewhere classified: Secondary | ICD-10-CM | POA: Diagnosis not present

## 2018-06-17 DIAGNOSIS — M25511 Pain in right shoulder: Secondary | ICD-10-CM | POA: Diagnosis not present

## 2018-06-17 DIAGNOSIS — M25611 Stiffness of right shoulder, not elsewhere classified: Secondary | ICD-10-CM | POA: Diagnosis not present

## 2018-06-17 DIAGNOSIS — M25512 Pain in left shoulder: Secondary | ICD-10-CM | POA: Diagnosis not present

## 2018-06-20 DIAGNOSIS — M25511 Pain in right shoulder: Secondary | ICD-10-CM | POA: Diagnosis not present

## 2018-06-20 DIAGNOSIS — M25512 Pain in left shoulder: Secondary | ICD-10-CM | POA: Diagnosis not present

## 2018-06-20 DIAGNOSIS — M25611 Stiffness of right shoulder, not elsewhere classified: Secondary | ICD-10-CM | POA: Diagnosis not present

## 2018-06-20 DIAGNOSIS — M25612 Stiffness of left shoulder, not elsewhere classified: Secondary | ICD-10-CM | POA: Diagnosis not present

## 2018-06-27 DIAGNOSIS — M25511 Pain in right shoulder: Secondary | ICD-10-CM | POA: Diagnosis not present

## 2018-06-27 DIAGNOSIS — M25612 Stiffness of left shoulder, not elsewhere classified: Secondary | ICD-10-CM | POA: Diagnosis not present

## 2018-06-27 DIAGNOSIS — M25512 Pain in left shoulder: Secondary | ICD-10-CM | POA: Diagnosis not present

## 2018-06-27 DIAGNOSIS — M25611 Stiffness of right shoulder, not elsewhere classified: Secondary | ICD-10-CM | POA: Diagnosis not present

## 2018-07-01 ENCOUNTER — Other Ambulatory Visit: Payer: Self-pay | Admitting: Family Medicine

## 2018-07-01 DIAGNOSIS — M25512 Pain in left shoulder: Secondary | ICD-10-CM | POA: Diagnosis not present

## 2018-07-01 DIAGNOSIS — M25511 Pain in right shoulder: Secondary | ICD-10-CM | POA: Diagnosis not present

## 2018-07-01 DIAGNOSIS — M25611 Stiffness of right shoulder, not elsewhere classified: Secondary | ICD-10-CM | POA: Diagnosis not present

## 2018-07-01 DIAGNOSIS — M25612 Stiffness of left shoulder, not elsewhere classified: Secondary | ICD-10-CM | POA: Diagnosis not present

## 2018-07-01 NOTE — Telephone Encounter (Signed)
Requested Prescriptions  Pending Prescriptions Disp Refills  . acyclovir (ZOVIRAX) 400 MG tablet [Pharmacy Med Name: ACYCLOVIR 400 MG TABLET] 90 tablet 3    Sig: TAKE 1 TABLET BY MOUTH EVERY DAY     Antimicrobials:  Antiviral Agents - Anti-Herpetic Passed - 07/01/2018  1:52 AM      Passed - Valid encounter within last 12 months    Recent Outpatient Visits          7 months ago [place], Megan P, DO   8 months ago Essential hypertension   [place], [place], DO   1 year ago Essential hypertension   [place], Megan P, DO   1 year ago Upper respiratory tract infection, unspecified type   [place], Megan P, DO   1 year ago Medicare annual wellness visit, subsequent   [employer], [place], DO      Future Appointments            In 5 days Wynetta Emery, Barb Merino, DO [employer], [place]   In 4 months  [employer], [place]

## 2018-07-05 DIAGNOSIS — M25611 Stiffness of right shoulder, not elsewhere classified: Secondary | ICD-10-CM | POA: Diagnosis not present

## 2018-07-05 DIAGNOSIS — M25512 Pain in left shoulder: Secondary | ICD-10-CM | POA: Diagnosis not present

## 2018-07-05 DIAGNOSIS — M25612 Stiffness of left shoulder, not elsewhere classified: Secondary | ICD-10-CM | POA: Diagnosis not present

## 2018-07-05 DIAGNOSIS — M25511 Pain in right shoulder: Secondary | ICD-10-CM | POA: Diagnosis not present

## 2018-07-06 ENCOUNTER — Ambulatory Visit (INDEPENDENT_AMBULATORY_CARE_PROVIDER_SITE_OTHER): Payer: Medicare Other | Admitting: Family Medicine

## 2018-07-06 ENCOUNTER — Encounter: Payer: Self-pay | Admitting: Family Medicine

## 2018-07-06 VITALS — BP 138/72 | HR 92 | Temp 97.9°F | Ht 59.5 in | Wt 150.4 lb

## 2018-07-06 DIAGNOSIS — E039 Hypothyroidism, unspecified: Secondary | ICD-10-CM

## 2018-07-06 DIAGNOSIS — D500 Iron deficiency anemia secondary to blood loss (chronic): Secondary | ICD-10-CM

## 2018-07-06 DIAGNOSIS — E538 Deficiency of other specified B group vitamins: Secondary | ICD-10-CM

## 2018-07-06 DIAGNOSIS — J432 Centrilobular emphysema: Secondary | ICD-10-CM

## 2018-07-06 DIAGNOSIS — M5136 Other intervertebral disc degeneration, lumbar region: Secondary | ICD-10-CM | POA: Diagnosis not present

## 2018-07-06 DIAGNOSIS — I10 Essential (primary) hypertension: Secondary | ICD-10-CM

## 2018-07-06 DIAGNOSIS — M5416 Radiculopathy, lumbar region: Secondary | ICD-10-CM | POA: Diagnosis not present

## 2018-07-06 DIAGNOSIS — M48062 Spinal stenosis, lumbar region with neurogenic claudication: Secondary | ICD-10-CM | POA: Diagnosis not present

## 2018-07-06 DIAGNOSIS — F4321 Adjustment disorder with depressed mood: Secondary | ICD-10-CM

## 2018-07-06 MED ORDER — BUPROPION HCL ER (XL) 300 MG PO TB24: 300.0000 mg | ORAL_TABLET | Freq: Every day | ORAL | 1 refills | Status: DC

## 2018-07-06 MED ORDER — LISINOPRIL 5 MG PO TABS: 5.0000 mg | ORAL_TABLET | Freq: Every day | ORAL | 1 refills | Status: DC

## 2018-07-06 NOTE — Assessment & Plan Note (Signed)
Stable. Continue to follow with pulmonology. Call with any concerns.

## 2018-07-06 NOTE — Assessment & Plan Note (Signed)
Rechecking levels today. Await results. Treat as needed.  

## 2018-07-06 NOTE — Assessment & Plan Note (Signed)
Stable on current regimen. Continue current regimen. Continue to monitor. Call with any concerns.

## 2018-07-06 NOTE — Progress Notes (Signed)
BP 138/72 (BP Location: Left Arm, Cuff Size: Normal)   Pulse 92   Temp 97.9 F (36.6 C)   Ht 4' 11.5" (1.511 m)   Wt 150 lb 7 oz (68.2 kg)   LMP 06/03/1972   SpO2 99%   BMI 29.88 kg/m    Subjective:    Patient ID: Joanna Rodriguez, female    DOB: Aug 16, 1950, 68 y.o.   MRN: [id number]  HPI: Joanna Rodriguez is a 68 y.o. female  Chief Complaint  Patient presents with  . Follow-up    patient was seen by the nurse through her insurance, has information to discuss  . Hypertension   Seeing Physiatrist for back pain, going to be discussing shots. Back has been hurting a lot and making it hard for her to stand and and move around. She notes that the tramadol is not helping her back anymore. To see what is going on with them.   HYPERTENSION Hypertension status: controlled  Satisfied with current treatment? yes Duration of hypertension: chronic BP monitoring frequency:  not checking BP medication side effects:  no Medication compliance: excellent compliance Previous BP meds: lisinopril Aspirin: yes Recurrent headaches: no Visual changes: no Palpitations: no Dyspnea: no Chest pain: no Lower extremity edema: no Dizzy/lightheaded: no  DEPRESSION- having issues with her mother. She is caring for her with her sisters, but her mother abandoned her when she was 30. This has been a big issue for her. Mood status: stable Satisfied with current treatment?: yes Symptom severity: moderate  Duration of current treatment : months Side effects: no Medication compliance: excellent compliance Psychotherapy/counseling: no  Previous psychiatric medications: wellbutrin Depressed mood: yes Anxious mood: no Anhedonia: no Significant weight loss or gain: no Insomnia: no  Fatigue: no Feelings of worthlessness or guilt: no Impaired concentration/indecisiveness: no Suicidal ideations: no Hopelessness: no Crying spells: no Depression screen [facility] 2/9 07/06/2018 10/27/2017 03/22/2017  12/28/2016 10/27/2016  Decreased Interest 1 1 0 0 0  Down, Depressed, Hopeless 1 1 0 0 0  PHQ - 2 Score 2 2 0 0 0  Altered sleeping 0 1 1 0 -  Tired, decreased energy 1 1 1 1  -  Change in appetite 0 1 1 2  -  Feeling bad or failure about yourself  0 1 0 0 -  Trouble concentrating 0 1 0 2 -  Moving slowly or fidgety/restless 0 1 0 2 -  Suicidal thoughts 0 1 0 0 -  PHQ-9 Score 3 9 3 7  -  Difficult doing work/chores Somewhat difficult Somewhat difficult Somewhat difficult Very difficult -   HYPOTHYROIDISM Thyroid control status:stable Satisfied with current treatment? yes Medication side effects: no Medication compliance: excellent compliance Recent dose adjustment:yes Fatigue: no Cold intolerance: no Heat intolerance: no Weight gain: no Weight loss: no Constipation: no Diarrhea/loose stools: no Palpitations: no Lower extremity edema: no Anxiety/depressed mood: no  Relevant past medical, surgical, family and social history reviewed and updated as indicated. Interim medical history since our last visit reviewed. Allergies and medications reviewed and updated.  Review of Systems  Constitutional: Negative.   Respiratory: Negative.   Cardiovascular: Negative.   Musculoskeletal: Negative.   Neurological: Negative.   Psychiatric/Behavioral: Negative.     Per HPI unless specifically indicated above     Objective:    BP 138/72 (BP Location: Left Arm, Cuff Size: Normal)   Pulse 92   Temp 97.9 F (36.6 C)   Ht 4' 11.5" (1.511 m)   Wt 150 lb 7 oz (68.2  kg)   LMP 06/03/1972   SpO2 99%   BMI 29.88 kg/m   Wt Readings from Last 3 Encounters:  07/06/18 150 lb 7 oz (68.2 kg)  03/28/18 150 lb 8 oz (68.3 kg)  03/03/18 149 lb (67.6 kg)    Physical Exam Vitals signs and nursing note reviewed.  Constitutional:      General: She is not in acute distress.    Appearance: Normal appearance. She is not ill-appearing, toxic-appearing or diaphoretic.  HENT:     Head: Normocephalic and  atraumatic.     Right Ear: External ear normal.     Left Ear: External ear normal.     Nose: Nose normal.     Mouth/Throat:     Mouth: Mucous membranes are moist.     Pharynx: Oropharynx is clear.  Eyes:     General: No scleral icterus.       Right eye: No discharge.        Left eye: No discharge.     Extraocular Movements: Extraocular movements intact.     Conjunctiva/sclera: Conjunctivae normal.     Pupils: Pupils are equal, round, and reactive to light.  Neck:     Musculoskeletal: Normal range of motion and neck supple.  Cardiovascular:     Rate and Rhythm: Normal rate and regular rhythm.     Pulses: Normal pulses.     Heart sounds: Normal heart sounds. No murmur. No friction rub. No gallop.   Pulmonary:     Effort: Pulmonary effort is normal. No respiratory distress.     Breath sounds: Normal breath sounds. No stridor. No wheezing, rhonchi or rales.  Chest:     Chest wall: No tenderness.  Musculoskeletal: Normal range of motion.  Skin:    General: Skin is warm and dry.     Capillary Refill: Capillary refill takes less than 2 seconds.     Coloration: Skin is not jaundiced or pale.     Findings: No bruising, erythema, lesion or rash.  Neurological:     General: No focal deficit present.     Mental Status: She is alert and oriented to person, place, and time. Mental status is at baseline.  Psychiatric:        Mood and Affect: Mood normal.        Behavior: Behavior normal.        Thought Content: Thought content normal.        Judgment: Judgment normal.     Results for orders placed or performed in visit on 04/06/18  TSH  Result Value Ref Range   TSH 0.093 (L) 0.450 - 4.500 uIU/mL      Assessment & Plan:   Problem List Items Addressed This Visit      Cardiovascular and Mediastinum   HTN (hypertension) - Primary    Better on recheck. Continue lisinopril. Continue to monitor. Call with any concerns.       Relevant Medications   lisinopril (PRINIVIL,ZESTRIL) 5  MG tablet   Other Relevant Orders   Basic metabolic panel     Respiratory   COPD (chronic obstructive pulmonary disease) (HCC)    Stable. Continue to follow with pulmonology. Call with any concerns.         Endocrine   Thyroid activity decreased    Rechecking levels today. Await results. Treat as needed.       Relevant Orders   TSH     Other   B12 deficiency    Rechecking levels today.  Await results. Treat as needed.       Relevant Orders   CBC with Differential/Platelet   B12 and Folate Panel   Adjustment disorder with depressed mood    Stable on current regimen. Continue current regimen. Continue to monitor. Call with any concerns.       Iron deficiency anemia due to chronic blood loss    Rechecking levels today. Await results. Treat as needed.       Relevant Orders   CBC with Differential/Platelet       Follow up plan: Return in about 6 months (around 01/04/2019) for wellness/physical.

## 2018-07-06 NOTE — Assessment & Plan Note (Signed)
Better on recheck. Continue lisinopril. Continue to monitor. Call with any concerns.

## 2018-07-07 ENCOUNTER — Other Ambulatory Visit: Payer: Self-pay | Admitting: Family Medicine

## 2018-07-07 DIAGNOSIS — M25612 Stiffness of left shoulder, not elsewhere classified: Secondary | ICD-10-CM | POA: Diagnosis not present

## 2018-07-07 DIAGNOSIS — M25512 Pain in left shoulder: Secondary | ICD-10-CM | POA: Diagnosis not present

## 2018-07-07 DIAGNOSIS — M25611 Stiffness of right shoulder, not elsewhere classified: Secondary | ICD-10-CM | POA: Diagnosis not present

## 2018-07-07 DIAGNOSIS — M25511 Pain in right shoulder: Secondary | ICD-10-CM | POA: Diagnosis not present

## 2018-07-07 LAB — CBC WITH DIFFERENTIAL/PLATELET
Basophils Absolute: 0.1 10*3/uL (ref 0.0–0.2)
Basos: 1 %
EOS (ABSOLUTE): 0 10*3/uL (ref 0.0–0.4)
EOS: 1 %
Hematocrit: 38.6 % (ref 34.0–46.6)
Hemoglobin: 13.2 g/dL (ref 11.1–15.9)
Immature Grans (Abs): 0 10*3/uL (ref 0.0–0.1)
Immature Granulocytes: 0 %
Lymphocytes Absolute: 1.4 10*3/uL (ref 0.7–3.1)
Lymphs: 26 %
MCH: 34.9 pg — ABNORMAL HIGH (ref 26.6–33.0)
MCHC: 34.2 g/dL (ref 31.5–35.7)
MCV: 102 fL — ABNORMAL HIGH (ref 79–97)
Monocytes Absolute: 0.4 10*3/uL (ref 0.1–0.9)
Monocytes: 7 %
Neutrophils Absolute: 3.5 10*3/uL (ref 1.4–7.0)
Neutrophils: 65 %
Platelets: 440 10*3/uL (ref 150–450)
RBC: 3.78 x10E6/uL (ref 3.77–5.28)
RDW: 12.9 % (ref 11.7–15.4)
WBC: 5.4 10*3/uL (ref 3.4–10.8)

## 2018-07-07 LAB — BASIC METABOLIC PANEL
BUN/Creatinine Ratio: 16 (ref 12–28)
BUN: 14 mg/dL (ref 8–27)
CO2: 26 mmol/L (ref 20–29)
CREATININE: 0.89 mg/dL (ref 0.57–1.00)
Calcium: 10.2 mg/dL (ref 8.7–10.3)
Chloride: 101 mmol/L (ref 96–106)
GFR calc Af Amer: 76 mL/min/{1.73_m2} (ref >59)
GFR calc non Af Amer: 66 mL/min/{1.73_m2} (ref >59)
Glucose: 104 mg/dL — ABNORMAL HIGH (ref 65–99)
Potassium: 5.4 mmol/L — ABNORMAL HIGH (ref 3.5–5.2)
Sodium: 143 mmol/L (ref 134–144)

## 2018-07-07 LAB — TSH: TSH: 1.74 u[IU]/mL (ref 0.450–4.500)

## 2018-07-07 LAB — B12 AND FOLATE PANEL
Folate: 19.4 ng/mL (ref >3.0)
Vitamin B-12: >2000 pg/mL — ABNORMAL HIGH (ref 232–1245)

## 2018-07-07 MED ORDER — LEVOTHYROXINE SODIUM 100 MCG PO TABS: 100.0000 ug | ORAL_TABLET | Freq: Every day | ORAL | 3 refills | Status: AC

## 2018-07-12 DIAGNOSIS — M25512 Pain in left shoulder: Secondary | ICD-10-CM | POA: Diagnosis not present

## 2018-07-12 DIAGNOSIS — M25612 Stiffness of left shoulder, not elsewhere classified: Secondary | ICD-10-CM | POA: Diagnosis not present

## 2018-07-12 DIAGNOSIS — M25611 Stiffness of right shoulder, not elsewhere classified: Secondary | ICD-10-CM | POA: Diagnosis not present

## 2018-07-12 DIAGNOSIS — M25511 Pain in right shoulder: Secondary | ICD-10-CM | POA: Diagnosis not present

## 2018-07-14 DIAGNOSIS — M25512 Pain in left shoulder: Secondary | ICD-10-CM | POA: Diagnosis not present

## 2018-07-14 DIAGNOSIS — M25511 Pain in right shoulder: Secondary | ICD-10-CM | POA: Diagnosis not present

## 2018-07-14 DIAGNOSIS — M25611 Stiffness of right shoulder, not elsewhere classified: Secondary | ICD-10-CM | POA: Diagnosis not present

## 2018-07-14 DIAGNOSIS — M25612 Stiffness of left shoulder, not elsewhere classified: Secondary | ICD-10-CM | POA: Diagnosis not present

## 2018-07-15 ENCOUNTER — Telehealth: Payer: Self-pay | Admitting: Family Medicine

## 2018-07-15 DIAGNOSIS — H8110 Benign paroxysmal vertigo, unspecified ear: Secondary | ICD-10-CM

## 2018-07-15 NOTE — Telephone Encounter (Signed)
Copied from [place] [id number]. Topic: General - Inquiry >> Jul 15, 2018 11:29 AM Conception Chancy, NT wrote: Reason for CRM: patient is calling and would like to know if Dr. Wynetta Emery can refer her to a physical therapist in regards to her vertigo. She states she has seen Dr. Wynetta Emery for this before. Please advise.

## 2018-07-19 DIAGNOSIS — M25611 Stiffness of right shoulder, not elsewhere classified: Secondary | ICD-10-CM | POA: Diagnosis not present

## 2018-07-19 DIAGNOSIS — M25512 Pain in left shoulder: Secondary | ICD-10-CM | POA: Diagnosis not present

## 2018-07-19 DIAGNOSIS — M25511 Pain in right shoulder: Secondary | ICD-10-CM | POA: Diagnosis not present

## 2018-07-19 DIAGNOSIS — M25612 Stiffness of left shoulder, not elsewhere classified: Secondary | ICD-10-CM | POA: Diagnosis not present

## 2018-07-19 NOTE — Telephone Encounter (Signed)
Please change PT location to [facility]

## 2018-07-19 NOTE — Telephone Encounter (Signed)
Patient calling to check on the status of her referral for physical therapy. States that she would like the referral to be sent to [facility].  Fax#: [phone]

## 2018-07-20 NOTE — Telephone Encounter (Signed)
Thank you :)

## 2018-07-21 DIAGNOSIS — M25611 Stiffness of right shoulder, not elsewhere classified: Secondary | ICD-10-CM | POA: Diagnosis not present

## 2018-07-21 DIAGNOSIS — M25612 Stiffness of left shoulder, not elsewhere classified: Secondary | ICD-10-CM | POA: Diagnosis not present

## 2018-07-21 DIAGNOSIS — M25511 Pain in right shoulder: Secondary | ICD-10-CM | POA: Diagnosis not present

## 2018-07-21 DIAGNOSIS — M25512 Pain in left shoulder: Secondary | ICD-10-CM | POA: Diagnosis not present

## 2018-07-26 DIAGNOSIS — R279 Unspecified lack of coordination: Secondary | ICD-10-CM | POA: Diagnosis not present

## 2018-07-26 DIAGNOSIS — R2689 Other abnormalities of gait and mobility: Secondary | ICD-10-CM | POA: Diagnosis not present

## 2018-07-26 DIAGNOSIS — R262 Difficulty in walking, not elsewhere classified: Secondary | ICD-10-CM | POA: Diagnosis not present

## 2018-07-28 DIAGNOSIS — R279 Unspecified lack of coordination: Secondary | ICD-10-CM | POA: Diagnosis not present

## 2018-07-28 DIAGNOSIS — R2689 Other abnormalities of gait and mobility: Secondary | ICD-10-CM | POA: Diagnosis not present

## 2018-07-28 DIAGNOSIS — R262 Difficulty in walking, not elsewhere classified: Secondary | ICD-10-CM | POA: Diagnosis not present

## 2018-08-02 DIAGNOSIS — R279 Unspecified lack of coordination: Secondary | ICD-10-CM | POA: Diagnosis not present

## 2018-08-02 DIAGNOSIS — R262 Difficulty in walking, not elsewhere classified: Secondary | ICD-10-CM | POA: Diagnosis not present

## 2018-08-02 DIAGNOSIS — R2689 Other abnormalities of gait and mobility: Secondary | ICD-10-CM | POA: Diagnosis not present

## 2018-08-04 DIAGNOSIS — R262 Difficulty in walking, not elsewhere classified: Secondary | ICD-10-CM | POA: Diagnosis not present

## 2018-08-04 DIAGNOSIS — R279 Unspecified lack of coordination: Secondary | ICD-10-CM | POA: Diagnosis not present

## 2018-08-04 DIAGNOSIS — R2689 Other abnormalities of gait and mobility: Secondary | ICD-10-CM | POA: Diagnosis not present

## 2018-08-08 DIAGNOSIS — M5136 Other intervertebral disc degeneration, lumbar region: Secondary | ICD-10-CM | POA: Diagnosis not present

## 2018-08-08 DIAGNOSIS — M5416 Radiculopathy, lumbar region: Secondary | ICD-10-CM | POA: Diagnosis not present

## 2018-08-08 DIAGNOSIS — M48062 Spinal stenosis, lumbar region with neurogenic claudication: Secondary | ICD-10-CM | POA: Diagnosis not present

## 2018-08-11 DIAGNOSIS — R262 Difficulty in walking, not elsewhere classified: Secondary | ICD-10-CM | POA: Diagnosis not present

## 2018-08-11 DIAGNOSIS — R2689 Other abnormalities of gait and mobility: Secondary | ICD-10-CM | POA: Diagnosis not present

## 2018-08-11 DIAGNOSIS — R279 Unspecified lack of coordination: Secondary | ICD-10-CM | POA: Diagnosis not present

## 2018-08-14 ENCOUNTER — Other Ambulatory Visit: Payer: Self-pay | Admitting: Gastroenterology

## 2018-08-16 DIAGNOSIS — R2689 Other abnormalities of gait and mobility: Secondary | ICD-10-CM | POA: Diagnosis not present

## 2018-08-16 DIAGNOSIS — R279 Unspecified lack of coordination: Secondary | ICD-10-CM | POA: Diagnosis not present

## 2018-08-16 DIAGNOSIS — R262 Difficulty in walking, not elsewhere classified: Secondary | ICD-10-CM | POA: Diagnosis not present

## 2018-08-18 DIAGNOSIS — R279 Unspecified lack of coordination: Secondary | ICD-10-CM | POA: Diagnosis not present

## 2018-08-18 DIAGNOSIS — R2689 Other abnormalities of gait and mobility: Secondary | ICD-10-CM | POA: Diagnosis not present

## 2018-08-18 DIAGNOSIS — R262 Difficulty in walking, not elsewhere classified: Secondary | ICD-10-CM | POA: Diagnosis not present

## 2018-08-22 DIAGNOSIS — J449 Chronic obstructive pulmonary disease, unspecified: Secondary | ICD-10-CM | POA: Diagnosis not present

## 2018-08-22 DIAGNOSIS — K219 Gastro-esophageal reflux disease without esophagitis: Secondary | ICD-10-CM | POA: Diagnosis not present

## 2018-08-22 DIAGNOSIS — Z87891 Personal history of nicotine dependence: Secondary | ICD-10-CM | POA: Diagnosis not present

## 2018-08-22 DIAGNOSIS — I10 Essential (primary) hypertension: Secondary | ICD-10-CM | POA: Diagnosis not present

## 2018-08-22 DIAGNOSIS — K449 Diaphragmatic hernia without obstruction or gangrene: Secondary | ICD-10-CM | POA: Diagnosis not present

## 2018-08-22 DIAGNOSIS — K22711 Barrett's esophagus with high grade dysplasia: Secondary | ICD-10-CM | POA: Diagnosis not present

## 2018-08-22 DIAGNOSIS — R0602 Shortness of breath: Secondary | ICD-10-CM | POA: Diagnosis not present

## 2018-08-26 DIAGNOSIS — R262 Difficulty in walking, not elsewhere classified: Secondary | ICD-10-CM | POA: Diagnosis not present

## 2018-08-26 DIAGNOSIS — R2689 Other abnormalities of gait and mobility: Secondary | ICD-10-CM | POA: Diagnosis not present

## 2018-08-26 DIAGNOSIS — R279 Unspecified lack of coordination: Secondary | ICD-10-CM | POA: Diagnosis not present

## 2018-09-05 DIAGNOSIS — M48062 Spinal stenosis, lumbar region with neurogenic claudication: Secondary | ICD-10-CM | POA: Diagnosis not present

## 2018-09-05 DIAGNOSIS — M5136 Other intervertebral disc degeneration, lumbar region: Secondary | ICD-10-CM | POA: Diagnosis not present

## 2018-09-05 DIAGNOSIS — M5416 Radiculopathy, lumbar region: Secondary | ICD-10-CM | POA: Diagnosis not present

## 2018-09-06 ENCOUNTER — Other Ambulatory Visit: Payer: Self-pay | Admitting: Gastroenterology

## 2018-09-23 DIAGNOSIS — R279 Unspecified lack of coordination: Secondary | ICD-10-CM | POA: Diagnosis not present

## 2018-09-23 DIAGNOSIS — R262 Difficulty in walking, not elsewhere classified: Secondary | ICD-10-CM | POA: Diagnosis not present

## 2018-09-23 DIAGNOSIS — R2689 Other abnormalities of gait and mobility: Secondary | ICD-10-CM | POA: Diagnosis not present

## 2018-09-26 DIAGNOSIS — M5136 Other intervertebral disc degeneration, lumbar region: Secondary | ICD-10-CM | POA: Diagnosis not present

## 2018-10-20 ENCOUNTER — Encounter: Payer: Self-pay | Admitting: *Deleted

## 2018-10-31 ENCOUNTER — Ambulatory Visit: Payer: Self-pay

## 2018-11-02 ENCOUNTER — Telehealth: Payer: Self-pay

## 2018-11-02 ENCOUNTER — Ambulatory Visit (INDEPENDENT_AMBULATORY_CARE_PROVIDER_SITE_OTHER): Payer: Medicare Other

## 2018-11-02 ENCOUNTER — Other Ambulatory Visit: Payer: Self-pay

## 2018-11-02 VITALS — BP 171/96 | HR 88 | Ht 59.5 in | Wt 150.0 lb

## 2018-11-02 DIAGNOSIS — Z0000 Encounter for general adult medical examination without abnormal findings: Secondary | ICD-10-CM | POA: Diagnosis not present

## 2018-11-02 NOTE — Patient Instructions (Addendum)
Joanna Rodriguez , Thank you for taking time to come for your Medicare Wellness Visit. I appreciate your ongoing commitment to your health goals. Please review the following plan we discussed and let me know if I can assist you in the future.   Screening recommendations/referrals: Colonoscopy: due 07/2019 Mammogram: due 01/2019 Bone Density: completed  Recommended yearly ophthalmology/optometry visit for glaucoma screening and checkup Recommended yearly dental visit for hygiene and checkup  Vaccinations: Influenza vaccine: up to date Pneumococcal vaccine: up to date Tdap vaccine: up to date Shingles vaccine: not indicated    Advanced directives: copy on file  Conditions/risks identified: none   Next appointment: Follow up in one year for your annual wellness exam.    Preventive Care 90 Years and Older, Female Preventive care refers to lifestyle choices and visits with your health care provider that can promote health and wellness. What does preventive care include?  A yearly physical exam. This is also called an annual well check.  Dental exams once or twice a year.  Routine eye exams. Ask your health care provider how often you should have your eyes checked.  Personal lifestyle choices, including:  Daily care of your teeth and gums.  Regular physical activity.  Eating a healthy diet.  Avoiding tobacco and drug use.  Limiting alcohol use.  Practicing safe sex.  Taking low-dose aspirin every day.  Taking vitamin and mineral supplements as recommended by your health care provider. What happens during an annual well check? The services and screenings done by your health care provider during your annual well check will depend on your age, overall health, lifestyle risk factors, and family history of disease. Counseling  Your health care provider may ask you questions about your:  Alcohol use.  Tobacco use.  Drug use.  Emotional well-being.  Home and relationship  well-being.  Sexual activity.  Eating habits.  History of falls.  Memory and ability to understand (cognition).  Work and work Statistician.  Reproductive health. Screening  You may have the following tests or measurements:  Height, weight, and BMI.  Blood pressure.  Lipid and cholesterol levels. These may be checked every 5 years, or more frequently if you are over 46 years old.  Skin check.  Lung cancer screening. You may have this screening every year starting at age 60 if you have a 30-pack-year history of smoking and currently smoke or have quit within the past 15 years.  Fecal occult blood test (FOBT) of the stool. You may have this test every year starting at age 61.  Flexible sigmoidoscopy or colonoscopy. You may have a sigmoidoscopy every 5 years or a colonoscopy every 10 years starting at age 61.  Hepatitis C blood test.  Hepatitis B blood test.  Sexually transmitted disease (STD) testing.  Diabetes screening. This is done by checking your blood sugar (glucose) after you have not eaten for a while (fasting). You may have this done every 1-3 years.  Bone density scan. This is done to screen for osteoporosis. You may have this done starting at age 82.  Mammogram. This may be done every 1-2 years. Talk to your health care provider about how often you should have regular mammograms. Talk with your health care provider about your test results, treatment options, and if necessary, the need for more tests. Vaccines  Your health care provider may recommend certain vaccines, such as:  Influenza vaccine. This is recommended every year.  Tetanus, diphtheria, and acellular pertussis (Tdap, Td) vaccine. You may need  a Td booster every 10 years.  Zoster vaccine. You may need this after age 57.  Pneumococcal 13-valent conjugate (PCV13) vaccine. One dose is recommended after age 97.  Pneumococcal polysaccharide (PPSV23) vaccine. One dose is recommended after age 32.  Talk to your health care provider about which screenings and vaccines you need and how often you need them. This information is not intended to replace advice given to you by your health care provider. Make sure you discuss any questions you have with your health care provider. Document Released: 07/12/2015 Document Revised: 03/04/2016 Document Reviewed: 04/16/2015 Elsevier Interactive Patient Education  2017 Ages Prevention in the Home Falls can cause injuries. They can happen to people of all ages. There are many things you can do to make your home safe and to help prevent falls. What can I do on the outside of my home?  Regularly fix the edges of walkways and driveways and fix any cracks.  Remove anything that might make you trip as you walk through a door, such as a raised step or threshold.  Trim any bushes or trees on the path to your home.  Use bright outdoor lighting.  Clear any walking paths of anything that might make someone trip, such as rocks or tools.  Regularly check to see if handrails are loose or broken. Make sure that both sides of any steps have handrails.  Any raised decks and porches should have guardrails on the edges.  Have any leaves, snow, or ice cleared regularly.  Use sand or salt on walking paths during winter.  Clean up any spills in your garage right away. This includes oil or grease spills. What can I do in the bathroom?  Use night lights.  Install grab bars by the toilet and in the tub and shower. Do not use towel bars as grab bars.  Use non-skid mats or decals in the tub or shower.  If you need to sit down in the shower, use a plastic, non-slip stool.  Keep the floor dry. Clean up any water that spills on the floor as soon as it happens.  Remove soap buildup in the tub or shower regularly.  Attach bath mats securely with double-sided non-slip rug tape.  Do not have throw rugs and other things on the floor that can make you  trip. What can I do in the bedroom?  Use night lights.  Make sure that you have a light by your bed that is easy to reach.  Do not use any sheets or blankets that are too big for your bed. They should not hang down onto the floor.  Have a firm chair that has side arms. You can use this for support while you get dressed.  Do not have throw rugs and other things on the floor that can make you trip. What can I do in the kitchen?  Clean up any spills right away.  Avoid walking on wet floors.  Keep items that you use a lot in easy-to-reach places.  If you need to reach something above you, use a strong step stool that has a grab bar.  Keep electrical cords out of the way.  Do not use floor polish or wax that makes floors slippery. If you must use wax, use non-skid floor wax.  Do not have throw rugs and other things on the floor that can make you trip. What can I do with my stairs?  Do not leave any items on the  stairs.  Make sure that there are handrails on both sides of the stairs and use them. Fix handrails that are broken or loose. Make sure that handrails are as long as the stairways.  Check any carpeting to make sure that it is firmly attached to the stairs. Fix any carpet that is loose or worn.  Avoid having throw rugs at the top or bottom of the stairs. If you do have throw rugs, attach them to the floor with carpet tape.  Make sure that you have a light switch at the top of the stairs and the bottom of the stairs. If you do not have them, ask someone to add them for you. What else can I do to help prevent falls?  Wear shoes that:  Do not have high heels.  Have rubber bottoms.  Are comfortable and fit you well.  Are closed at the toe. Do not wear sandals.  If you use a stepladder:  Make sure that it is fully opened. Do not climb a closed stepladder.  Make sure that both sides of the stepladder are locked into place.  Ask someone to hold it for you, if  possible.  Clearly mark and make sure that you can see:  Any grab bars or handrails.  First and last steps.  Where the edge of each step is.  Use tools that help you move around (mobility aids) if they are needed. These include:  Canes.  Walkers.  Scooters.  Crutches.  Turn on the lights when you go into a dark area. Replace any light bulbs as soon as they burn out.  Set up your furniture so you have a clear path. Avoid moving your furniture around.  If any of your floors are uneven, fix them.  If there are any pets around you, be aware of where they are.  Review your medicines with your doctor. Some medicines can make you feel dizzy. This can increase your chance of falling. Ask your doctor what other things that you can do to help prevent falls. This information is not intended to replace advice given to you by your health care provider. Make sure you discuss any questions you have with your health care provider. Document Released: 04/11/2009 Document Revised: 11/21/2015 Document Reviewed: 07/20/2014 Elsevier Interactive Patient Education  2017 [employer].

## 2018-11-02 NOTE — Progress Notes (Signed)
Subjective:   Joanna Rodriguez is a 68 y.o. female who presents for Medicare Annual (Subsequent) preventive examination.  This visit is being conducted via phone call  - after an attmept to do on video chat - due to the COVID-19 pandemic. This patient has given me verbal consent via phone to conduct this visit, patient states they are participating from their home address. Some vital signs may be absent or patient reported.   Patient identification: identified by name, DOB, and current address.    Review of Systems:  Cardiac Risk Factors include: advanced age (>52mn, >>65women);hypertension;smoking/ tobacco exposure     Objective:     Vitals: BP (!) 171/96 Comment: patient reported  Pulse 88   Ht 4' 11.5" (1.511 m) Comment: patient reported  Wt 150 lb (68 kg) Comment: patient reported  LMP 06/03/1972   BMI 29.79 kg/m   Body mass index is 29.79 kg/m.  Advanced Directives 11/02/2018 03/03/2018 01/06/2018 10/27/2017 08/27/2017 08/26/2017 08/05/2017  Does Patient Have a Medical Advance Directive? Yes Yes Yes Yes Yes Yes Yes  Type of Advance Directive Living will;Healthcare Power of [place] will [place]Living will [place] Clay Far[place]Living will Healthcare Power of [place] will [place]Living will  Does patient want to make changes to medical advance directive? - No - Patient declined No - Patient declined - No - Patient declined - No - Patient declined  Copy of Healthcare Power of Attorney in Chart? - No - copy requested No - copy requested Yes No - copy requested No - copy requested No - copy requested    Tobacco Social History   Tobacco Use  Smoking Status Former Smoker  . Packs/day: 1.50  . Years: 53.00  . Pack years: 79.50  . Types: Cigarettes  . Last attempt to quit: 08/22/2014  . Years since quitting: 4.2  Smokeless Tobacco Never Used     Counseling  given: Not Answered   Clinical Intake:  Pre-visit preparation completed: Yes  Pain : No/denies pain     Nutritional Risks: None Diabetes: No  How often do you need to have someone help you when you read instructions, pamphlets, or other written materials from your doctor or pharmacy?: 1 - Never What is the last grade level you completed in school?: GED, vocational nursing, pharmacy tech   Interpreter Needed?: No  Information entered by :: Tiffany Hill,LPN   Past Medical History:  Diagnosis Date  . Arthritis   . Cancer ([facility]    face  . Complication of anesthesia    all anesthesia events in 2019 have worsened vertigo issues  . COPD (chronic obstructive pulmonary disease) ([place]   . Crohn's disease ([place]    1989 ?  .Marland KitchenDyspnea on exertion   . Gastritis   . Genital herpes   . H/O pneumothorax    x 2  . History of kidney stones   . Hypertension   . Hypothyroid   . Intestinal metaplasia of gastric mucosa   . Kidney stones   . Osteopenia   . Personal history of tobacco use, presenting hazards to health 10/23/2015  . Polyp of sigmoid colon   . Scoliosis   . Spontaneous pneumothorax   . Trouble swallowing   . Vertigo       . Volvulus ([facility]    Past Surgical History:  Procedure Laterality Date  . ABDOMINAL HYSTERECTOMY    . APPENDECTOMY    .  BREAST EXCISIONAL BIOPSY Bilateral 1990's   NEG  . CAPSULOTOMY METATARSOPHALANGEAL Left 03/19/2016   Procedure: CAPSULOTOMY METATARSOPHALANGEAL;  Surgeon: Albertine Patricia, DPM;  Location: [place];  Service: Podiatry;  Laterality: Left;  Second left toe.  Marland Kitchen COLONOSCOPY WITH PROPOFOL N/A 08/05/2017   Procedure: COLONOSCOPY WITH PROPOFOL;  Surgeon: Lucilla Lame, MD;  Location: [place];  Service: Endoscopy;  Laterality: N/A;  . COLONOSCOPY WITH PROPOFOL N/A 01/06/2018   Procedure: COLONOSCOPY WITH PROPOFOL;  Surgeon: Lucilla Lame, MD;  Location: [place];  Service: Endoscopy;  Laterality: N/A;  .  ESOPHAGOGASTRODUODENOSCOPY (EGD) WITH PROPOFOL N/A 11/21/2015   Procedure: ESOPHAGOGASTRODUODENOSCOPY (EGD) WITH PROPOFOL with dialation;  Surgeon: Lucilla Lame, MD;  Location: [place];  Service: Endoscopy;  Laterality: N/A;  . ESOPHAGOGASTRODUODENOSCOPY (EGD) WITH PROPOFOL N/A 01/06/2018   Procedure: ESOPHAGOGASTRODUODENOSCOPY (EGD) WITH biopsy.;  Surgeon: Lucilla Lame, MD;  Location: [place];  Service: Endoscopy;  Laterality: N/A;  . ESOPHAGOGASTRODUODENOSCOPY (EGD) WITH PROPOFOL N/A 03/03/2018   Procedure: ESOPHAGOGASTRODUODENOSCOPY (EGD) WITH PROPOFOL with biopsies;  Surgeon: Lucilla Lame, MD;  Location: [place];  Service: Endoscopy;  Laterality: N/A;  . FLEXOR TENDON REPAIR Left 03/19/2016   Procedure: FLEXOR TENDON release, percutaneous.;  Surgeon: Albertine Patricia, DPM;  Location: [place];  Service: Podiatry;  Laterality: Left;  4th left toe.  Marland Kitchen HAMMER TOE SURGERY Left 03/19/2016   Procedure: HAMMER TOE CORRECTION with PIPjoint fusion;  Surgeon: Albertine Patricia, DPM;  Location: [place];  Service: Podiatry;  Laterality: Left;  Third left toe.  Marland Kitchen HAMMER TOE SURGERY Right 08/06/2016   Procedure: HAMMER TOE CORRECTION  RIGHT 2ND AND 3RD;  Surgeon: Albertine Patricia, DPM;  Location: [place];  Service: Podiatry;  Laterality: Right;  LMA with local Special Needs:  [place]  [employer] Needs to be 2nd patient per office  . LAPAROSCOPIC RIGHT COLECTOMY Right 08/27/2017   Procedure: LAPAROSCOPIC RIGHT COLECTOMY;  Surgeon: Robert Bellow, MD;  Location: [facility];  Service: General;  Laterality: Right;  . POLYPECTOMY  08/05/2017   Procedure: POLYPECTOMY;  Surgeon: Lucilla Lame, MD;  Location: [place];  Service: Endoscopy;;  . POLYPECTOMY  01/06/2018   Procedure: POLYPECTOMY INTESTINAL;  Surgeon: Lucilla Lame, MD;  Location: [place];  Service: Endoscopy;;  . TALC PLEURODESIS  07/2014   left side  . THORACOSCOPY  07/2014    left side  . TUBAL LIGATION    . [place], with small bowel resection  . WEIL OSTEOTOMY Right 08/06/2016   Procedure: WEIL right 2nd & 3rd;  Surgeon: Albertine Patricia, DPM;  Location: [place];  Service: Podiatry;  Laterality: Right;   Family History  Problem Relation Age of Onset  . Hypertension Mother   . Hypothyroidism Mother   . Alcohol abuse Mother   . Breast cancer Mother 10  . Hyperlipidemia Mother   . Mental illness Mother   . Heart attack Father   . Heart disease Father   . Glaucoma Father   . Colon cancer Father 7  . Diabetes Father   . Hypertension Father   . Heart disease Sister   . Hyperlipidemia Sister   . Hypertension Sister   . Lung disease Sister    Social History   Socioeconomic History  . Marital status: Divorced    Spouse name: Not on file  . Number of children: Not on file  . Years of education: GED, LPN, pharmacy tech   . Highest  education level: Associate degree: occupational, Hotel manager, or vocational program  Occupational History  . Occupation: retired    Comment: Forensic scientist Rep - stocking\packing  Social Needs  . Financial resource strain: Not hard at all  . Food insecurity:    Worry: Never true    Inability: Never true  . Transportation needs:    Medical: No    Non-medical: No  Tobacco Use  . Smoking status: Former Smoker    Packs/day: 1.50    Years: 53.00    Pack years: 79.50    Types: Cigarettes    Last attempt to quit: 08/22/2014    Years since quitting: 4.2  . Smokeless tobacco: Never Used  Substance and Sexual Activity  . Alcohol use: Not Currently    Alcohol/week: 0.0 standard drinks    Comment: occasional - 2-3x a year   . Drug use: No  . Sexual activity: Never  Lifestyle  . Physical activity:    Days per week: 3 days    Minutes per session: 30 min  . Stress: Not at all  Relationships  . Social connections:    Talks on phone: More than three times a week    Gets together: More than  three times a week    Attends religious service: Never    Active member of club or organization: Yes    Attends meetings of clubs or organizations: More than 4 times per year    Relationship status: Divorced  Other Topics Concern  . Not on file  Social History Narrative   Volunteers at senior center     Outpatient Encounter Medications as of 11/02/2018  Medication Sig  . acyclovir (ZOVIRAX) 400 MG tablet Take 400 mg by mouth 5 (five) times daily. One a day  . albuterol (VENTOLIN HFA) 108 (90 Base) MCG/ACT inhaler Inhale 2 puffs into the lungs every 6 (six) hours as needed for wheezing or shortness of breath.  Marland Kitchen aspirin EC 81 MG tablet Take 81 mg by mouth daily.  Marland Kitchen buPROPion (WELLBUTRIN XL) 300 MG 24 hr tablet Take 1 tablet (300 mg total) by mouth daily.  . calcium carbonate (OSCAL) 1500 (600 Ca) MG TABS tablet Take by mouth 2 (two) times daily with a meal.  . Glucosamine-Chondroit-Vit C-Mn (GLUCOSAMINE-CHONDROITIN) TABS Take by mouth.  . levothyroxine (SYNTHROID, LEVOTHROID) 100 MCG tablet Take 1 tablet (100 mcg total) by mouth daily.  Marland Kitchen lisinopril (PRINIVIL,ZESTRIL) 5 MG tablet Take 1 tablet (5 mg total) by mouth daily.  . Melatonin 5 MG TABS Take 10 mg by mouth at bedtime as needed (for sleep.).   Marland Kitchen mercaptopurine (PURINETHOL) 50 MG tablet TAKE 1 AND 1/2 TABLETS EVERY DAY  . Multiple Vitamins-Minerals (MULTIVITAMIN WITH MINERALS) tablet Take 1 tablet by mouth daily. One-A-Day for 50+  . pantoprazole (PROTONIX) 40 MG tablet TAKE 1 TABLET BY MOUTH TWICE A DAY  . Polyethyl Glycol-Propyl Glycol (SYSTANE) 0.4-0.3 % SOLN Apply to eye.  Marland Kitchen tiZANidine (ZANAFLEX) 2 MG tablet Take by mouth.  . vitamin B-12 (CYANOCOBALAMIN) 1000 MCG tablet Take 1,000 mcg by mouth daily.  . [DISCONTINUED] traMADol (ULTRAM) 50 MG tablet Take 1 tablet (50 mg total) by mouth 3 (three) times daily. (Patient not taking: Reported on 11/02/2018)   No facility-administered encounter medications on file as of 11/02/2018.      Activities of Daily Living In your present state of health, do you have any difficulty performing the following activities: 11/02/2018 03/03/2018  Hearing? Y N  Comment has right hearing aid -  Vision? N N  Difficulty concentrating or making decisions? N N  Walking or climbing stairs? Y N  Dressing or bathing? N N  Doing errands, shopping? N -  Preparing Food and eating ? N -  Using the Toilet? N -  In the past six months, have you accidently leaked urine? N -  Do you have problems with loss of bowel control? N -  Managing your Medications? N -  Managing your Finances? N -  Housekeeping or managing your Housekeeping? N -  Some recent data might be hidden    Patient Care Team: Valerie Roys, DO as PCP - General (Family Medicine) Lucilla Lame, MD as Consulting Physician (Gastroenterology) Wilhelmina Mcardle, MD as Consulting Physician (Pulmonary Disease)    Assessment:   This is a routine wellness examination for Irini.  Exercise Activities and Dietary recommendations Current Exercise Habits: Home exercise routine, Type of exercise: walking, Time (Minutes): 30, Frequency (Times/Week): 3, Weekly Exercise (Minutes/Week): 90, Intensity: Mild, Exercise limited by: None identified  Goals    . DIET - INCREASE WATER INTAKE     Recommend drinking at least 6-8 glasses of water        Fall Risk: Fall Risk  11/02/2018 11/25/2017 10/27/2017 03/22/2017 12/28/2016  Falls in the past year? 1 Yes No No No  Number falls in past yr: 1 1 - - -  Injury with Fall? 0 No - - -  Risk for fall due to : History of fall(s);Impaired balance/gait - - - -  Follow up Education provided;Falls prevention discussed Falls evaluation completed - - -    FALL RISK PREVENTION PERTAINING TO THE HOME:  Any stairs in or around the home? Yes  If so, are there any without handrails? no  Home free of loose throw rugs in walkways, pet beds, electrical cords, etc? Yes  Adequate lighting in your home to reduce risk of  falls? Yes   ASSISTIVE DEVICES UTILIZED TO PREVENT FALLS:  Life alert? Yes  Use of a cane, walker or w/c? No  Grab bars in the bathroom? Yes  Shower chair or bench in shower? No  Elevated toilet seat or a handicapped toilet? Yes     TIMED UP AND GO:  Unable to perform   DME/home health order needed?  No    Depression Screen PHQ 2/9 Scores 11/02/2018 07/06/2018 10/27/2017 03/22/2017  PHQ - 2 Score 0 2 2 0  PHQ- 9 Score - 3 9 3      Cognitive Function     6CIT Screen 10/27/2017 10/27/2016  What Year? 0 points 0 points  What month? 0 points 0 points  What time? 0 points 0 points  Count back from 20 0 points 0 points  Months in reverse 0 points 0 points  Repeat phrase 2 points 0 points  Total Score 2 0    Immunization History  Administered Date(s) Administered  . Influenza, High Dose Seasonal PF 04/29/2017, 03/14/2018  . Influenza-Unspecified 05/13/2012, 03/23/2013, 03/08/2014, 03/09/2015, 02/07/2016  . PPD Test 01/26/2014  . Pneumococcal Conjugate-13 03/18/2015  . Pneumococcal Polysaccharide-23 02/07/2016  . Pneumococcal-Unspecified 02/07/2014, 03/08/2014  . Tdap 07/30/2009    Qualifies for Shingles Vaccine? Yes  Zostavax completed n/a. Due for Shingrix. Education has been provided regarding the importance of this vaccine. Pt has been advised to call insurance company to determine out of pocket expense. Advised may also receive vaccine at local pharmacy or Health Dept. Verbalized acceptance and understanding.  Tdap: up to date   Flu  Vaccine: up to date   Pneumococcal Vaccine: up to date   Screening Tests Health Maintenance  Topic Date Due  . COLONOSCOPY  01/07/2019  . INFLUENZA VACCINE  01/28/2019  . TETANUS/TDAP  07/31/2019  . MAMMOGRAM  02/03/2020  . DEXA SCAN  Completed  . Hepatitis C Screening  Completed  . PNA vac Low Risk Adult  Completed    Cancer Screenings:  Colorectal Screening: Completed 01/06/2018. Repeat every 1 year. EGD 07/2018  Mammogram:  Completed 02/02/2018. Repeat every year  Bone Density: Completed 07/04/2015.   Lung Cancer Screening: (Low Dose CT Chest recommended if Age 3-80 years, 30 pack-year currently smoking OR have quit w/in 15years.) does qualify.  Completed 10/27/2017  Additional Screening:  Hepatitis C Screening: does qualify; Completed 05/20/2015  Vision Screening: Recommended annual ophthalmology exams for early detection of glaucoma and other disorders of the eye. Is the patient up to date with their annual eye exam?  Yes  Who is the provider or what is the name of the office in which the pt attends annual eye exams? [facility]   Dental Screening: Recommended annual dental exams for proper oral hygiene  Community Resource Referral:  CRR required this visit?  No       Plan:  I have personally reviewed and addressed the Medicare Annual Wellness questionnaire and have noted the following in the patient's chart:  A. Medical and social history B. Use of alcohol, tobacco or illicit drugs  C. Current medications and supplements D. Functional ability and status E.  Nutritional status F.  Physical activity G. Advance directives H. List of other physicians I.  Hospitalizations, surgeries, and ER visits in previous 12 months J.  [place] such as hearing and vision if needed, cognitive and depression L. Referrals and appointments   In addition, I have reviewed and discussed with patient certain preventive protocols, quality metrics, and best practice recommendations. A written personalized care plan for preventive services as well as general preventive health recommendations were provided to patient via mail.   Signed,    Bevelyn Ngo, LPN  10/31/4918 Nurse Health Advisor   Nurse Notes:  Patient complained of non- productive cough, declines fever- checks temp daily. No other symptoms.   BP elevated  Using at home BP cuff, patient advised to recheck in 30 minutes and call if still elevated.    Patient requesting to stop wellbutrin- advised to schedule appt with Dr.Johnson to discuss.

## 2018-11-02 NOTE — Telephone Encounter (Signed)
Called patient back and confirmed BP reading. Patient confirmed BP 134/91 done on wrist at home. denies any further questions or concerns at this time. Will fwd to Dr.Johnson as follow up from [facility].   Copied from [place] [id number]. Topic: General - Other >> Nov 02, 2018  9:27 AM Joanna Rodriguez wrote: Reason for CRM:   Pt is calling back In to update Braxtin Bamba with her BP reading.  134/91 pulse 76 for Medicare Wellness Visit

## 2018-12-09 DIAGNOSIS — H905 Unspecified sensorineural hearing loss: Secondary | ICD-10-CM | POA: Diagnosis not present

## 2018-12-13 ENCOUNTER — Telehealth: Payer: Self-pay | Admitting: *Deleted

## 2018-12-13 DIAGNOSIS — Z122 Encounter for screening for malignant neoplasm of respiratory organs: Secondary | ICD-10-CM

## 2018-12-13 DIAGNOSIS — Z87891 Personal history of nicotine dependence: Secondary | ICD-10-CM

## 2018-12-13 NOTE — Telephone Encounter (Signed)
Patient has been notified that annual lung cancer screening low dose CT scan is due currently or will be in near future. Confirmed that patient is within the age range of 55-77, and asymptomatic, (no signs or symptoms of lung cancer). Patient denies illness that would prevent curative treatment for lung cancer if found. Verified smoking history, (former, quit 2016, 53 pack year). The shared decision making visit was done 10/25/15. Patient is agreeable for CT scan being scheduled.

## 2018-12-16 DIAGNOSIS — Z1159 Encounter for screening for other viral diseases: Secondary | ICD-10-CM | POA: Diagnosis not present

## 2018-12-19 DIAGNOSIS — Z09 Encounter for follow-up examination after completed treatment for conditions other than malignant neoplasm: Secondary | ICD-10-CM | POA: Diagnosis not present

## 2018-12-19 DIAGNOSIS — K210 Gastro-esophageal reflux disease with esophagitis: Secondary | ICD-10-CM | POA: Diagnosis not present

## 2018-12-19 DIAGNOSIS — I10 Essential (primary) hypertension: Secondary | ICD-10-CM | POA: Diagnosis not present

## 2018-12-19 DIAGNOSIS — K22711 Barrett's esophagus with high grade dysplasia: Secondary | ICD-10-CM | POA: Diagnosis not present

## 2018-12-19 DIAGNOSIS — E039 Hypothyroidism, unspecified: Secondary | ICD-10-CM | POA: Diagnosis not present

## 2018-12-19 DIAGNOSIS — D509 Iron deficiency anemia, unspecified: Secondary | ICD-10-CM | POA: Diagnosis not present

## 2018-12-19 DIAGNOSIS — K2930 Chronic superficial gastritis without bleeding: Secondary | ICD-10-CM | POA: Diagnosis not present

## 2018-12-19 DIAGNOSIS — Z79899 Other long term (current) drug therapy: Secondary | ICD-10-CM | POA: Diagnosis not present

## 2018-12-19 DIAGNOSIS — J449 Chronic obstructive pulmonary disease, unspecified: Secondary | ICD-10-CM | POA: Diagnosis not present

## 2018-12-19 DIAGNOSIS — K449 Diaphragmatic hernia without obstruction or gangrene: Secondary | ICD-10-CM | POA: Diagnosis not present

## 2018-12-19 DIAGNOSIS — Z87891 Personal history of nicotine dependence: Secondary | ICD-10-CM | POA: Diagnosis not present

## 2018-12-20 ENCOUNTER — Ambulatory Visit
Admission: RE | Admit: 2018-12-20 | Discharge: 2018-12-20 | Disposition: A | Discharge status: Home / Self Care | Payer: Medicare Other | Source: Ambulatory Visit | Attending: Oncology | Admitting: Oncology

## 2018-12-20 ENCOUNTER — Other Ambulatory Visit: Payer: Self-pay

## 2018-12-20 DIAGNOSIS — Z122 Encounter for screening for malignant neoplasm of respiratory organs: Secondary | ICD-10-CM | POA: Diagnosis not present

## 2018-12-20 DIAGNOSIS — Z87891 Personal history of nicotine dependence: Secondary | ICD-10-CM | POA: Insufficient documentation

## 2018-12-21 ENCOUNTER — Encounter: Payer: Self-pay | Admitting: *Deleted

## 2018-12-22 DIAGNOSIS — H25013 Cortical age-related cataract, bilateral: Secondary | ICD-10-CM | POA: Diagnosis not present

## 2018-12-22 DIAGNOSIS — H02883 Meibomian gland dysfunction of right eye, unspecified eyelid: Secondary | ICD-10-CM | POA: Diagnosis not present

## 2018-12-22 DIAGNOSIS — H02886 Meibomian gland dysfunction of left eye, unspecified eyelid: Secondary | ICD-10-CM | POA: Diagnosis not present

## 2018-12-22 DIAGNOSIS — H2513 Age-related nuclear cataract, bilateral: Secondary | ICD-10-CM | POA: Diagnosis not present

## 2018-12-22 DIAGNOSIS — H02889 Meibomian gland dysfunction of unspecified eye, unspecified eyelid: Secondary | ICD-10-CM | POA: Diagnosis not present

## 2018-12-23 ENCOUNTER — Telehealth: Payer: Self-pay | Admitting: Family Medicine

## 2018-12-23 NOTE — Telephone Encounter (Signed)
Forwarding medication refills to PCP  For review.

## 2018-12-27 NOTE — Telephone Encounter (Signed)
Due for physical.

## 2018-12-28 ENCOUNTER — Encounter: Payer: Self-pay | Admitting: Pulmonary Disease

## 2018-12-28 ENCOUNTER — Ambulatory Visit (INDEPENDENT_AMBULATORY_CARE_PROVIDER_SITE_OTHER): Payer: Medicare Other | Admitting: Pulmonary Disease

## 2018-12-28 DIAGNOSIS — J449 Chronic obstructive pulmonary disease, unspecified: Secondary | ICD-10-CM | POA: Diagnosis not present

## 2018-12-28 NOTE — Patient Instructions (Signed)
Continue albuterol inhaler as needed for increased shortness of breath, chest tightness, cough, wheezing  Follow up in this office as needed

## 2018-12-28 NOTE — Progress Notes (Signed)
PULMONARY OFFICE FOLLOW UP NOTE  PROBLEMS:  Former smoker Moderate COPD History of spontaneous left PTX X 2 - s/p L pleurodesis   DATA: PFTs 10/23/14: moderate obstruction, FEV1 1.30 L (58% pred), normal lung volumes, normal DLCO LDCT 10/25/15: moderate emphysema, sequelae from prior pleurodesis, no suspicious nodules LDCT 10/26/16: No worrisome or new findings LDCT 12/20/18:   INTERVAL HISTORY: Last seen 03/02/2018.  No major pulmonary events.    Virtual Visit via Telephone Note I connected with Joanna Rodriguez on 12/28/18 at  9:30 AM EDT by telephone and verified that I am speaking with the correct person using two identifiers. I discussed the limitations, risks, security and privacy concerns of performing an evaluation and management service by telephone and the availability of in person appointments. I also discussed with the patient that there may be a patient responsible charge related to this service. The patient expressed understanding and agreed to proceed.   SUBJ: Walking regularly. No change in level of DOE. No new complaints. Denies CP, fever, purulent sputum, hemoptysis, LE edema and calf tenderness. Remains off of Spiriva. Uses albuterol inhaler 0-2 times per day (most days none).   OBJ: There were no vitals filed for this visit.  EXAM: Due to the remote nature of this encounter, n o physical examination could be performed  DATA: BMP Latest Ref Rng & Units 07/06/2018 10/27/2017 08/29/2017  Glucose 65 - 99 mg/dL 104(H) 113(H) 98  BUN 8 - 27 mg/dL 14 18 13   Creatinine 0.57 - 1.00 mg/dL 0.89 0.85 0.76  BUN/Creat Ratio 12 - 28 16 21  -  Sodium 134 - 144 mmol/L 143 138 139  Potassium 3.5 - 5.2 mmol/L 5.4(H) 4.7 3.9  Chloride 96 - 106 mmol/L 101 99 103  CO2 20 - 29 mmol/L 26 23 26   Calcium 8.7 - 10.3 mg/dL 10.2 9.8 8.8(L)   CBC Latest Ref Rng & Units 07/06/2018 02/17/2018 01/10/2018  WBC 3.4 - 10.8 x10E3/uL 5.4 5.4 5.2  Hemoglobin 11.1 - 15.9 g/dL 13.2 13.4 12.2   Hematocrit 34.0 - 46.6 % 38.6 39.0 36.8  Platelets 150 - 450 x10E3/uL 440 404 390   CXR: No new film  IMPRESSION: Moderate COPD - well compensated. Minimal airway hyperreactivity    PLAN: Continue albuterol inhaler as needed for increased shortness of breath, chest tightness, wheezing, cough  Follow-up as needed.    Merton Border, MD PCCM service Mobile [phone] Pager [phone] 12/28/2018 9:54 AM

## 2018-12-29 NOTE — Telephone Encounter (Signed)
Called pt to schedule physical no answer left vm

## 2018-12-29 NOTE — Telephone Encounter (Signed)
Pt called back to schedule physical. She states that she is wanting to stop taking bupropion and is wanting to know the best way to do it. Please advise.

## 2019-01-02 NOTE — Telephone Encounter (Signed)
Left message on machine for pt to return call to the office.  

## 2019-01-02 NOTE — Telephone Encounter (Signed)
Given the dose she is on, we will need to change Rx- please schedule appointment so se can discuss coming off meds, OK for virtual.

## 2019-01-02 NOTE — Telephone Encounter (Signed)
Pt scheduled  

## 2019-01-05 ENCOUNTER — Ambulatory Visit (INDEPENDENT_AMBULATORY_CARE_PROVIDER_SITE_OTHER): Payer: Medicare Other | Admitting: Family Medicine

## 2019-01-05 ENCOUNTER — Encounter: Payer: Self-pay | Admitting: Family Medicine

## 2019-01-05 ENCOUNTER — Other Ambulatory Visit: Payer: Self-pay

## 2019-01-05 DIAGNOSIS — F4321 Adjustment disorder with depressed mood: Secondary | ICD-10-CM

## 2019-01-05 MED ORDER — BUPROPION HCL ER (SR) 150 MG PO TB12: ORAL_TABLET | ORAL | 0 refills | Status: DC

## 2019-01-05 NOTE — Patient Instructions (Signed)
To come off the Wellbutrin- 2 tabs qAM 1 tab qPM for 3-5 days, then 1 tab BID for 3-5 days, then 1 tab qAM for 3-5 days, then stop

## 2019-01-05 NOTE — Assessment & Plan Note (Signed)
Doing better and would like to come off the wellbutrin. Discussed how to wean. Will call if starts to feel worse off the medicine. Follow up as needed. Call with any concerns.

## 2019-01-05 NOTE — Progress Notes (Signed)
BP (!) 143/90   Temp 98.7 F (37.1 C) (Oral)   Ht 4' 11"  (1.499 m)   Wt 150 lb (68 kg)   LMP 06/03/1972   BMI 30.30 kg/m    Subjective:    Patient ID: Joanna Rodriguez, female    DOB: 08/10/1950, 68 y.o.   MRN: [id number]  HPI: Joanna Rodriguez is a 68 y.o. female  Chief Complaint  Patient presents with  . Medication Management    Wellbutrin (doesnt feel like it helps)   DEPRESSION Mood status: controlled Satisfied with current treatment?: no Symptom severity: mild  Duration of current treatment : months Side effects: no Medication compliance: excellent compliance Psychotherapy/counseling: no  Previous psychiatric medications: wellbutrin Depressed mood: no Anxious mood: no Anhedonia: no Significant weight loss or gain: no Insomnia: no  Fatigue: no Feelings of worthlessness or guilt: no Impaired concentration/indecisiveness: no Suicidal ideations: no Hopelessness: no Crying spells: no Depression screen [facility] 2/9 01/05/2019 11/02/2018 07/06/2018 10/27/2017 03/22/2017  Decreased Interest 0 0 1 1 0  Down, Depressed, Hopeless 0 0 1 1 0  PHQ - 2 Score 0 0 2 2 0  Altered sleeping 0 - 0 1 1  Tired, decreased energy 1 - 1 1 1   Change in appetite 0 - 0 1 1  Feeling bad or failure about yourself  0 - 0 1 0  Trouble concentrating 0 - 0 1 0  Moving slowly or fidgety/restless 0 - 0 1 0  Suicidal thoughts 0 - 0 1 0  PHQ-9 Score 1 - 3 9 3   Difficult doing work/chores Not difficult at all - Somewhat difficult Somewhat difficult Somewhat difficult    Relevant past medical, surgical, family and social history reviewed and updated as indicated. Interim medical history since our last visit reviewed. Allergies and medications reviewed and updated.  Review of Systems  Constitutional: Negative.   Respiratory: Negative.   Cardiovascular: Negative.   Musculoskeletal: Negative.   Neurological: Negative.   Psychiatric/Behavioral: Negative.     Per HPI unless specifically  indicated above     Objective:    BP (!) 143/90   Temp 98.7 F (37.1 C) (Oral)   Ht 4' 11"  (1.499 m)   Wt 150 lb (68 kg)   LMP 06/03/1972   BMI 30.30 kg/m   Wt Readings from Last 3 Encounters:  01/05/19 150 lb (68 kg)  12/20/18 153 lb (69.4 kg)  11/02/18 150 lb (68 kg)    Physical Exam Vitals signs and nursing note reviewed.  Pulmonary:     Effort: Pulmonary effort is normal. No respiratory distress.     Comments: Speaking in full sentences Neurological:     Mental Status: She is alert.  Psychiatric:        Mood and Affect: Mood normal.        Behavior: Behavior normal.        Thought Content: Thought content normal.        Judgment: Judgment normal.     Results for orders placed or performed in visit on 07/06/18  TSH  Result Value Ref Range   TSH 1.740 0.450 - 4.500 uIU/mL  CBC with Differential/Platelet  Result Value Ref Range   WBC 5.4 3.4 - 10.8 x10E3/uL   RBC 3.78 3.77 - 5.28 x10E6/uL   Hemoglobin 13.2 11.1 - 15.9 g/dL   Hematocrit 38.6 34.0 - 46.6 %   MCV 102 (H) 79 - 97 fL   MCH 34.9 (H) 26.6 - 33.0 pg  MCHC 34.2 31.5 - 35.7 g/dL   RDW 12.9 11.7 - 15.4 %   Platelets 440 150 - 450 x10E3/uL   Neutrophils 65 Not Estab. %   Lymphs 26 Not Estab. %   Monocytes 7 Not Estab. %   Eos 1 Not Estab. %   Basos 1 Not Estab. %   Neutrophils Absolute 3.5 1.4 - 7.0 x10E3/uL   Lymphocytes Absolute 1.4 0.7 - 3.1 x10E3/uL   Monocytes Absolute 0.4 0.1 - 0.9 x10E3/uL   EOS (ABSOLUTE) 0.0 0.0 - 0.4 x10E3/uL   Basophils Absolute 0.1 0.0 - 0.2 x10E3/uL   Immature Granulocytes 0 Not Estab. %   Immature Grans (Abs) 0.0 0.0 - 0.1 S08H3/GI  Basic metabolic panel  Result Value Ref Range   Glucose 104 (H) 65 - 99 mg/dL   BUN 14 8 - 27 mg/dL   Creatinine, Ser 0.89 0.57 - 1.00 mg/dL   GFR calc non Af Amer 66 >59 mL/min/1.73   GFR calc Af Amer 76 >59 mL/min/1.73   BUN/Creatinine Ratio 16 12 - 28   Sodium 143 134 - 144 mmol/L   Potassium 5.4 (H) 3.5 - 5.2 mmol/L   Chloride  101 96 - 106 mmol/L   CO2 26 20 - 29 mmol/L   Calcium 10.2 8.7 - 10.3 mg/dL  B12 and Folate Panel  Result Value Ref Range   Vitamin B-12 >2000 (H) 232 - 1245 pg/mL   Folate 19.4 >3.0 ng/mL      Assessment & Plan:   Problem List Items Addressed This Visit      Other   Adjustment disorder with depressed mood    Doing better and would like to come off the wellbutrin. Discussed how to wean. Will call if starts to feel worse off the medicine. Follow up as needed. Call with any concerns.           Follow up plan: Return As scheduled.    . This visit was completed via telephone due to the restrictions of the COVID-19 pandemic. All issues as above were discussed and addressed but no physical exam was performed. If it was felt that the patient should be evaluated in the office, they were directed there. The patient verbally consented to this visit. Patient was unable to complete an audio/visual visit due to Lack of equipment. Due to the catastrophic nature of the COVID-19 pandemic, this visit was done through audio contact only. . Location of the patient: home . Location of the provider: work . Those involved with this call:  . Provider: Park Liter, DO . CMA: Gerda Diss, CMA . Front Desk/Registration: Don Perking  . Time spent on call: 15 minutes on the phone discussing health concerns. 23 minutes total spent in review of patient's record and preparation of their chart.

## 2019-01-21 ENCOUNTER — Other Ambulatory Visit: Payer: Self-pay | Admitting: Family Medicine

## 2019-01-21 NOTE — Telephone Encounter (Signed)
Requested Prescriptions  Pending Prescriptions Disp Refills  . lisinopril (ZESTRIL) 5 MG tablet [Pharmacy Med Name: LISINOPRIL 5 MG TABLET] 30 tablet 0    Sig: TAKE 1 TABLET BY MOUTH EVERY DAY     Cardiovascular:  ACE Inhibitors Failed - 01/21/2019  1:10 PM      Failed - Cr in normal range and within 180 days    Creatinine  Date Value Ref Range Status  07/24/2014 0.71 0.60 - 1.30 mg/dL Final   Creatinine, Ser  Date Value Ref Range Status  07/06/2018 0.89 0.57 - 1.00 mg/dL Final         Failed - K in normal range and within 180 days    Potassium  Date Value Ref Range Status  07/06/2018 5.4 (H) 3.5 - 5.2 mmol/L Final  07/24/2014 3.6 3.5 - 5.1 mmol/L Final         Failed - Last BP in normal range    BP Readings from Last 1 Encounters:  01/05/19 (!) 143/90         Passed - Patient is not pregnant      Passed - Valid encounter within last 6 months    Recent Outpatient Visits          2 weeks ago Adjustment disorder with depressed mood   [place], [place], DO   6 months ago Essential hypertension   [place], [place], DO   1 year ago [place], [place], DO   1 year ago Essential hypertension   [place], [place], DO   1 year ago Essential hypertension   [place], [place], DO      Future Appointments            In 1 week Wynetta Emery, Barb Merino, DO [employer], [place]   In 1 month [place], Barb Merino, DO [employer], [facility]

## 2019-01-30 ENCOUNTER — Ambulatory Visit (INDEPENDENT_AMBULATORY_CARE_PROVIDER_SITE_OTHER): Payer: Medicare Other | Admitting: Family Medicine

## 2019-01-30 ENCOUNTER — Encounter: Payer: Self-pay | Admitting: Family Medicine

## 2019-01-30 ENCOUNTER — Other Ambulatory Visit: Payer: Self-pay

## 2019-01-30 VITALS — BP 140/84 | HR 96 | Temp 98.6°F | Wt 159.0 lb

## 2019-01-30 DIAGNOSIS — R8271 Bacteriuria: Secondary | ICD-10-CM | POA: Diagnosis not present

## 2019-01-30 DIAGNOSIS — E538 Deficiency of other specified B group vitamins: Secondary | ICD-10-CM | POA: Diagnosis not present

## 2019-01-30 DIAGNOSIS — M8580 Other specified disorders of bone density and structure, unspecified site: Secondary | ICD-10-CM

## 2019-01-30 DIAGNOSIS — E039 Hypothyroidism, unspecified: Secondary | ICD-10-CM

## 2019-01-30 DIAGNOSIS — R413 Other amnesia: Secondary | ICD-10-CM

## 2019-01-30 DIAGNOSIS — Z13220 Encounter for screening for lipoid disorders: Secondary | ICD-10-CM

## 2019-01-30 DIAGNOSIS — R296 Repeated falls: Secondary | ICD-10-CM | POA: Insufficient documentation

## 2019-01-30 DIAGNOSIS — C182 Malignant neoplasm of ascending colon: Secondary | ICD-10-CM | POA: Diagnosis not present

## 2019-01-30 DIAGNOSIS — G3281 Cerebellar ataxia in diseases classified elsewhere: Secondary | ICD-10-CM | POA: Diagnosis not present

## 2019-01-30 DIAGNOSIS — M791 Myalgia, unspecified site: Secondary | ICD-10-CM | POA: Diagnosis not present

## 2019-01-30 DIAGNOSIS — D500 Iron deficiency anemia secondary to blood loss (chronic): Secondary | ICD-10-CM

## 2019-01-30 LAB — CBC WITH DIFFERENTIAL/PLATELET
Hematocrit: 40.3 % (ref 34.0–46.6)
Hemoglobin: 14.3 g/dL (ref 11.1–15.9)
Lymphocytes Absolute: 1.8 10*3/uL (ref 0.7–3.1)
Lymphs: 29 %
MCH: 35.9 pg — ABNORMAL HIGH (ref 26.6–33.0)
MCHC: 35.5 g/dL (ref 31.5–35.7)
MCV: 101 fL — ABNORMAL HIGH (ref 79–97)
MID (Absolute): 0.6 10*3/uL (ref 0.1–1.6)
MID: 9 %
Neutrophils Absolute: 4 10*3/uL (ref 1.4–7.0)
Neutrophils: 62 %
Platelets: 374 10*3/uL (ref 150–450)
RBC: 3.98 x10E6/uL (ref 3.77–5.28)
RDW: 14.3 % (ref 11.7–15.4)
WBC: 6.4 10*3/uL (ref 3.4–10.8)

## 2019-01-30 NOTE — Assessment & Plan Note (Signed)
Rechecking labs. Will treat as needed. Call with any concerns.

## 2019-01-30 NOTE — Addendum Note (Signed)
Addended by: Valerie Roys on: 01/30/2019 12:08 PM   Modules accepted: Orders

## 2019-01-30 NOTE — Assessment & Plan Note (Signed)
Now with memory issues and recurrent falls- will arrange MRI ASAP. Await results.

## 2019-01-30 NOTE — Assessment & Plan Note (Signed)
Getting progressively worse over the last couple of months, also including memory lapses as in HPI. Given history of colon cancer- will check MRI of brain to r/o mets and get home health PT in to help with balance now. Await results. Referral to neurology made today.

## 2019-01-30 NOTE — Progress Notes (Signed)
BP 140/84   Pulse 96   Temp 98.6 F (37 C)   Wt 159 lb (72.1 kg)   LMP 06/03/1972   SpO2 96%   BMI 32.11 kg/m    Subjective:    Patient ID: Joanna Rodriguez, female    DOB: 1950-08-05, 68 y.o.   MRN: [id number]  HPI: Joanna Rodriguez is a 68 y.o. female  Chief Complaint  Patient presents with  . Fall   Joanna Rodriguez presents today with her daughter. She has not been doing well. She notes that over the last couple of months, she has been falling a lot. She is unable to count how many times she has fallen. Thankfully, she has not injured herself with the falls. She has no idea that it's going to happen. She does not get dizzy, she does not feel funny. No chest pain. No SOB. She just falls. It is not just when she's moving or walking- she has fallen just while standing still. She has also fallen off the toilet and gotten tangled in the shower curtain. She has also been having issues with her memory. She is forgetting things at home, but has also gotten very lost. She had to go to [facility] for a COVID test prior to a procedure, and got lost going home and ended up 50 miles away from [facility] (about 4.5 hours away from her home). Her daughter is very concerned. She is concerned about her driving. She is otherwise feeling well with no other concerns or complaints at this time.   Relevant past medical, surgical, family and social history reviewed and updated as indicated. Interim medical history since our last visit reviewed. Allergies and medications reviewed and updated.  Review of Systems  Constitutional: Negative for activity change, appetite change, chills, diaphoresis, fatigue, fever and unexpected weight change.  HENT: Negative.   Respiratory: Negative.   Cardiovascular: Negative.   Gastrointestinal: Negative.   Musculoskeletal: Negative.   Skin: Negative.   Neurological: Negative for dizziness, tremors, seizures, syncope, facial asymmetry, speech difficulty, weakness,  light-headedness, numbness and headaches.       Increased falls Memory issues   Psychiatric/Behavioral: Positive for confusion. Negative for agitation, behavioral problems, decreased concentration, dysphoric mood, hallucinations, self-injury, sleep disturbance and suicidal ideas. The patient is not nervous/anxious and is not hyperactive.     Per HPI unless specifically indicated above     Objective:    BP 140/84   Pulse 96   Temp 98.6 F (37 C)   Wt 159 lb (72.1 kg)   LMP 06/03/1972   SpO2 96%   BMI 32.11 kg/m   Wt Readings from Last 3 Encounters:  01/30/19 159 lb (72.1 kg)  01/05/19 150 lb (68 kg)  12/20/18 153 lb (69.4 kg)    Physical Exam Vitals signs and nursing note reviewed.  Constitutional:      General: She is not in acute distress.    Appearance: Normal appearance. She is not ill-appearing, toxic-appearing or diaphoretic.  HENT:     Head: Normocephalic and atraumatic.     Right Ear: External ear normal.     Left Ear: External ear normal.     Nose: Nose normal.     Mouth/Throat:     Mouth: Mucous membranes are moist.     Pharynx: Oropharynx is clear.  Eyes:     General: No scleral icterus.       Right eye: No discharge.        Left eye: No discharge.  Extraocular Movements: Extraocular movements intact.     Conjunctiva/sclera: Conjunctivae normal.     Pupils: Pupils are equal, round, and reactive to light.  Neck:     Musculoskeletal: Normal range of motion and neck supple.  Cardiovascular:     Rate and Rhythm: Normal rate and regular rhythm.     Pulses: Normal pulses.     Heart sounds: Normal heart sounds. No murmur. No friction rub. No gallop.   Pulmonary:     Effort: Pulmonary effort is normal. No respiratory distress.     Breath sounds: Normal breath sounds. No stridor. No wheezing, rhonchi or rales.  Chest:     Chest wall: No tenderness.  Musculoskeletal: Normal range of motion.  Skin:    General: Skin is warm and dry.     Capillary Refill:  Capillary refill takes less than 2 seconds.     Coloration: Skin is not jaundiced or pale.     Findings: No bruising, erythema, lesion or rash.  Neurological:     General: No focal deficit present.     Mental Status: She is alert and oriented to person, place, and time. Mental status is at baseline.  Psychiatric:        Mood and Affect: Mood normal.        Behavior: Behavior normal.        Thought Content: Thought content normal.        Judgment: Judgment normal.     Results for orders placed or performed in visit on 07/06/18  TSH  Result Value Ref Range   TSH 1.740 0.450 - 4.500 uIU/mL  CBC with Differential/Platelet  Result Value Ref Range   WBC 5.4 3.4 - 10.8 x10E3/uL   RBC 3.78 3.77 - 5.28 x10E6/uL   Hemoglobin 13.2 11.1 - 15.9 g/dL   Hematocrit 38.6 34.0 - 46.6 %   MCV 102 (H) 79 - 97 fL   MCH 34.9 (H) 26.6 - 33.0 pg   MCHC 34.2 31.5 - 35.7 g/dL   RDW 12.9 11.7 - 15.4 %   Platelets 440 150 - 450 x10E3/uL   Neutrophils 65 Not Estab. %   Lymphs 26 Not Estab. %   Monocytes 7 Not Estab. %   Eos 1 Not Estab. %   Basos 1 Not Estab. %   Neutrophils Absolute 3.5 1.4 - 7.0 x10E3/uL   Lymphocytes Absolute 1.4 0.7 - 3.1 x10E3/uL   Monocytes Absolute 0.4 0.1 - 0.9 x10E3/uL   EOS (ABSOLUTE) 0.0 0.0 - 0.4 x10E3/uL   Basophils Absolute 0.1 0.0 - 0.2 x10E3/uL   Immature Granulocytes 0 Not Estab. %   Immature Grans (Abs) 0.0 0.0 - 0.1 H08M5/HQ  Basic metabolic panel  Result Value Ref Range   Glucose 104 (H) 65 - 99 mg/dL   BUN 14 8 - 27 mg/dL   Creatinine, Ser 0.89 0.57 - 1.00 mg/dL   GFR calc non Af Amer 66 >59 mL/min/1.73   GFR calc Af Amer 76 >59 mL/min/1.73   BUN/Creatinine Ratio 16 12 - 28   Sodium 143 134 - 144 mmol/L   Potassium 5.4 (H) 3.5 - 5.2 mmol/L   Chloride 101 96 - 106 mmol/L   CO2 26 20 - 29 mmol/L   Calcium 10.2 8.7 - 10.3 mg/dL  B12 and Folate Panel  Result Value Ref Range   Vitamin B-12 >2000 (H) 232 - 1245 pg/mL   Folate 19.4 >3.0 ng/mL       Assessment & Plan:   Problem List  Items Addressed This Visit      Digestive   Primary adenocarcinoma of ascending colon [facility])    Now with memory issues and recurrent falls- will arrange MRI ASAP. Await results.       Relevant Orders   MR Brain W Wo Contrast   Comprehensive metabolic panel     Endocrine   Thyroid activity decreased    Rechecking labs. Will treat as needed. Call with any concerns.       Relevant Orders   Comprehensive metabolic panel   TSH     Musculoskeletal and Integument   Osteopenia    Rechecking labs. Will treat as needed. Call with any concerns.       Relevant Orders   Comprehensive metabolic panel   VITAMIN D 25 Hydroxy (Vit-D Deficiency, Fractures)     Other   B12 deficiency    Rechecking labs. Will treat as needed. Call with any concerns.       Relevant Orders   Comprehensive metabolic panel   O27 and Folate Panel   Iron deficiency anemia due to chronic blood loss    Rechecking labs. Will treat as needed. Call with any concerns.       Relevant Orders   Comprehensive metabolic panel   CBC with Differential/Platelet   Recurrent falls    Getting progressively worse over the last couple of months, also including memory lapses as in HPI. Given history of colon cancer- will check MRI of brain to r/o mets and get home health PT in to help with balance now. Await results. Referral to neurology made today.      Relevant Orders   Comprehensive metabolic panel   UA/M w/rflx Culture, Routine   Ambulatory referral to [place]   Ambulatory referral to Neurology    Other Visit Diagnoses    Cerebellar ataxia in diseases classified elsewhere [facility])    -  Primary   Given history of colon cancer- will obtain MRI of the brain to r/o mets. Will get her into neurology, check labs and get home PT out. Call with any concerns.    Relevant Orders   MR Brain W Wo Contrast   Comprehensive metabolic panel   Ambulatory referral to Neurology   Screening for  cholesterol level       Rechecking labs. Will treat as needed. Call with any concerns.    Relevant Orders   Comprehensive metabolic panel   Lipid Panel w/o Chol/HDL Ratio   Memory deficits       New onset. Will get MRI of her brain and get her into see neurology. Checking labs. Monitor closely.   Relevant Orders   Ambulatory referral to Neurology       Follow up plan: Return Pending results (1 month).

## 2019-01-31 LAB — COMPREHENSIVE METABOLIC PANEL
ALT: 49 IU/L — ABNORMAL HIGH (ref 0–32)
AST: 35 IU/L (ref 0–40)
Albumin/Globulin Ratio: 1.6 (ref 1.2–2.2)
Albumin: 4.2 g/dL (ref 3.8–4.8)
Alkaline Phosphatase: 64 IU/L (ref 39–117)
BUN/Creatinine Ratio: 21 (ref 12–28)
BUN: 15 mg/dL (ref 8–27)
Bilirubin Total: 0.3 mg/dL (ref 0.0–1.2)
CO2: 23 mmol/L (ref 20–29)
Calcium: 9.5 mg/dL (ref 8.7–10.3)
Chloride: 101 mmol/L (ref 96–106)
Creatinine, Ser: 0.73 mg/dL (ref 0.57–1.00)
GFR calc Af Amer: 96 mL/min/{1.73_m2} (ref >59)
GFR calc non Af Amer: 84 mL/min/{1.73_m2} (ref >59)
Globulin, Total: 2.6 g/dL (ref 1.5–4.5)
Glucose: 88 mg/dL (ref 65–99)
Potassium: 4.6 mmol/L (ref 3.5–5.2)
Sodium: 142 mmol/L (ref 134–144)
Total Protein: 6.8 g/dL (ref 6.0–8.5)

## 2019-01-31 LAB — LIPID PANEL W/O CHOL/HDL RATIO
Cholesterol, Total: 170 mg/dL (ref 100–199)
HDL: 36 mg/dL — ABNORMAL LOW (ref >39)
LDL Calculated: 92 mg/dL (ref 0–99)
Triglycerides: 209 mg/dL — ABNORMAL HIGH (ref 0–149)
VLDL Cholesterol Cal: 42 mg/dL — ABNORMAL HIGH (ref 5–40)

## 2019-01-31 LAB — B12 AND FOLATE PANEL
Folate: >20 ng/mL (ref >3.0)
Vitamin B-12: 1851 pg/mL — ABNORMAL HIGH (ref 232–1245)

## 2019-01-31 LAB — TSH: TSH: 2.44 u[IU]/mL (ref 0.450–4.500)

## 2019-01-31 LAB — VITAMIN D 25 HYDROXY (VIT D DEFICIENCY, FRACTURES): Vit D, 25-Hydroxy: 76.7 ng/mL (ref 30.0–100.0)

## 2019-02-01 LAB — UA/M W/RFLX CULTURE, ROUTINE
Bilirubin, UA: NEGATIVE
Glucose, UA: NEGATIVE
Ketones, UA: NEGATIVE
Leukocytes,UA: NEGATIVE
Nitrite, UA: POSITIVE — AB
Protein,UA: NEGATIVE
RBC, UA: NEGATIVE
Specific Gravity, UA: <1.005 — ABNORMAL LOW (ref 1.005–1.030)
Urobilinogen, Ur: 0.2 mg/dL (ref 0.2–1.0)
pH, UA: 5 (ref 5.0–7.5)

## 2019-02-01 LAB — MICROSCOPIC EXAMINATION: RBC, Urine: NONE SEEN /hpf (ref 0–2)

## 2019-02-01 LAB — URINE CULTURE, REFLEX

## 2019-02-04 ENCOUNTER — Other Ambulatory Visit: Payer: Self-pay

## 2019-02-04 ENCOUNTER — Ambulatory Visit
Admission: RE | Admit: 2019-02-04 | Discharge: 2019-02-04 | Disposition: A | Discharge status: Home / Self Care | Payer: Medicare Other | Source: Ambulatory Visit | Attending: Family Medicine | Admitting: Family Medicine

## 2019-02-04 DIAGNOSIS — Z85038 Personal history of other malignant neoplasm of large intestine: Secondary | ICD-10-CM | POA: Diagnosis not present

## 2019-02-04 DIAGNOSIS — R296 Repeated falls: Secondary | ICD-10-CM | POA: Diagnosis not present

## 2019-02-04 DIAGNOSIS — M8580 Other specified disorders of bone density and structure, unspecified site: Secondary | ICD-10-CM | POA: Diagnosis not present

## 2019-02-04 DIAGNOSIS — Z9181 History of falling: Secondary | ICD-10-CM | POA: Diagnosis not present

## 2019-02-04 DIAGNOSIS — C182 Malignant neoplasm of ascending colon: Secondary | ICD-10-CM | POA: Diagnosis not present

## 2019-02-04 DIAGNOSIS — D538 Other specified nutritional anemias: Secondary | ICD-10-CM | POA: Diagnosis not present

## 2019-02-04 DIAGNOSIS — J634 Siderosis: Secondary | ICD-10-CM | POA: Diagnosis not present

## 2019-02-04 DIAGNOSIS — R413 Other amnesia: Secondary | ICD-10-CM | POA: Diagnosis not present

## 2019-02-04 DIAGNOSIS — G3281 Cerebellar ataxia in diseases classified elsewhere: Secondary | ICD-10-CM | POA: Insufficient documentation

## 2019-02-04 DIAGNOSIS — D500 Iron deficiency anemia secondary to blood loss (chronic): Secondary | ICD-10-CM | POA: Diagnosis not present

## 2019-02-04 DIAGNOSIS — G919 Hydrocephalus, unspecified: Secondary | ICD-10-CM | POA: Diagnosis not present

## 2019-02-04 DIAGNOSIS — E039 Hypothyroidism, unspecified: Secondary | ICD-10-CM | POA: Diagnosis not present

## 2019-02-04 MED ORDER — GADOBUTROL 1 MMOL/ML IV SOLN
7.0000 mL | Freq: Once | INTRAVENOUS | Status: AC | PRN
  Administered 2019-02-04: 7 mL via INTRAVENOUS

## 2019-02-05 ENCOUNTER — Telehealth: Payer: Self-pay | Admitting: Family Medicine

## 2019-02-05 NOTE — Telephone Encounter (Signed)
Please let her know- great news! No sign of cancer or stroke on her MRI. I do want her to see the neurologist, as she has something called hydrocephalus, or some swelling around the brain- this is likely what's causing her symptoms. It is fine for her to keep her appointment with Dr. Melrose Nakayama next week. Thanks!

## 2019-02-06 ENCOUNTER — Telehealth: Payer: Self-pay | Admitting: Family Medicine

## 2019-02-06 NOTE — Telephone Encounter (Signed)
Called and provided verbal order

## 2019-02-06 NOTE — Telephone Encounter (Signed)
Home Health Verbal Orders - Caller/Agency: [facility]   Requesting PT   Frequency: 1x1 2x2

## 2019-02-06 NOTE — Telephone Encounter (Signed)
Do we have number to call Merry Proud at to provider verbal?

## 2019-02-06 NOTE — Telephone Encounter (Signed)
Called and spoke to patient's daughter Sherlyn Lick [facility] reviewed) message relayed. Beverlee Nims verbalized understanding.

## 2019-02-08 ENCOUNTER — Telehealth: Payer: Self-pay | Admitting: Family Medicine

## 2019-02-08 DIAGNOSIS — D538 Other specified nutritional anemias: Secondary | ICD-10-CM | POA: Diagnosis not present

## 2019-02-08 DIAGNOSIS — D500 Iron deficiency anemia secondary to blood loss (chronic): Secondary | ICD-10-CM | POA: Diagnosis not present

## 2019-02-08 DIAGNOSIS — Z9181 History of falling: Secondary | ICD-10-CM | POA: Diagnosis not present

## 2019-02-08 DIAGNOSIS — R413 Other amnesia: Secondary | ICD-10-CM | POA: Diagnosis not present

## 2019-02-08 DIAGNOSIS — C182 Malignant neoplasm of ascending colon: Secondary | ICD-10-CM | POA: Diagnosis not present

## 2019-02-08 DIAGNOSIS — M8580 Other specified disorders of bone density and structure, unspecified site: Secondary | ICD-10-CM | POA: Diagnosis not present

## 2019-02-08 DIAGNOSIS — G3281 Cerebellar ataxia in diseases classified elsewhere: Secondary | ICD-10-CM | POA: Diagnosis not present

## 2019-02-08 DIAGNOSIS — R296 Repeated falls: Secondary | ICD-10-CM | POA: Diagnosis not present

## 2019-02-08 DIAGNOSIS — E039 Hypothyroidism, unspecified: Secondary | ICD-10-CM | POA: Diagnosis not present

## 2019-02-08 NOTE — Telephone Encounter (Signed)
Joanna Rodriguez with Joanna Rodriguez called to report that pt fell yesterday at her sisters home and has some scratches on her knees.  Per Konrad Dolores pt is fine, they just have to report falls. Konrad Dolores can be reached at [phone] if needed

## 2019-02-08 NOTE — Telephone Encounter (Signed)
Was at sister's house and walking up steps, scratched knees "barely".  Joanna Rodriguez reports he checked areas and only minor scratches.  No other injuries noted. Stated appreciation for call back.

## 2019-02-11 ENCOUNTER — Ambulatory Visit: Payer: Medicare Other

## 2019-02-15 DIAGNOSIS — R2689 Other abnormalities of gait and mobility: Secondary | ICD-10-CM | POA: Diagnosis not present

## 2019-02-15 DIAGNOSIS — G968 Other specified disorders of central nervous system: Secondary | ICD-10-CM | POA: Diagnosis not present

## 2019-02-16 ENCOUNTER — Telehealth: Payer: Self-pay | Admitting: Family Medicine

## 2019-02-16 DIAGNOSIS — C182 Malignant neoplasm of ascending colon: Secondary | ICD-10-CM | POA: Diagnosis not present

## 2019-02-16 DIAGNOSIS — D500 Iron deficiency anemia secondary to blood loss (chronic): Secondary | ICD-10-CM | POA: Diagnosis not present

## 2019-02-16 DIAGNOSIS — E039 Hypothyroidism, unspecified: Secondary | ICD-10-CM | POA: Diagnosis not present

## 2019-02-16 DIAGNOSIS — R413 Other amnesia: Secondary | ICD-10-CM | POA: Diagnosis not present

## 2019-02-16 DIAGNOSIS — M8580 Other specified disorders of bone density and structure, unspecified site: Secondary | ICD-10-CM | POA: Diagnosis not present

## 2019-02-16 DIAGNOSIS — Z9181 History of falling: Secondary | ICD-10-CM | POA: Diagnosis not present

## 2019-02-16 DIAGNOSIS — R296 Repeated falls: Secondary | ICD-10-CM | POA: Diagnosis not present

## 2019-02-16 DIAGNOSIS — D538 Other specified nutritional anemias: Secondary | ICD-10-CM | POA: Diagnosis not present

## 2019-02-16 DIAGNOSIS — G3281 Cerebellar ataxia in diseases classified elsewhere: Secondary | ICD-10-CM | POA: Diagnosis not present

## 2019-02-16 NOTE — Telephone Encounter (Signed)
Home Health Verbal Orders - Caller/Agency: [facility] Callback Number: [phone] secure Requesting OT/PT/Skilled Nursing/Social Work/Speech Therapy: Extending PT  Frequency: 1x for 2wks starting next week.  Cancelling visit this week. Please advise.

## 2019-02-16 NOTE — Telephone Encounter (Signed)
OK for verblas

## 2019-02-16 NOTE — Telephone Encounter (Signed)
Patient notified

## 2019-02-21 ENCOUNTER — Other Ambulatory Visit: Payer: Self-pay

## 2019-02-21 ENCOUNTER — Encounter: Payer: Self-pay | Admitting: Family Medicine

## 2019-02-21 ENCOUNTER — Ambulatory Visit (INDEPENDENT_AMBULATORY_CARE_PROVIDER_SITE_OTHER): Payer: Medicare Other | Admitting: Family Medicine

## 2019-02-21 VITALS — BP 137/83 | HR 90 | Temp 99.2°F | Ht 59.0 in | Wt 159.0 lb

## 2019-02-21 DIAGNOSIS — Z1239 Encounter for other screening for malignant neoplasm of breast: Secondary | ICD-10-CM

## 2019-02-21 DIAGNOSIS — I10 Essential (primary) hypertension: Secondary | ICD-10-CM | POA: Diagnosis not present

## 2019-02-21 DIAGNOSIS — Z23 Encounter for immunization: Secondary | ICD-10-CM

## 2019-02-21 DIAGNOSIS — J432 Centrilobular emphysema: Secondary | ICD-10-CM

## 2019-02-21 DIAGNOSIS — Z13220 Encounter for screening for lipoid disorders: Secondary | ICD-10-CM

## 2019-02-21 DIAGNOSIS — E039 Hypothyroidism, unspecified: Secondary | ICD-10-CM | POA: Diagnosis not present

## 2019-02-21 DIAGNOSIS — I2510 Atherosclerotic heart disease of native coronary artery without angina pectoris: Secondary | ICD-10-CM | POA: Diagnosis not present

## 2019-02-21 DIAGNOSIS — K50119 Crohn's disease of large intestine with unspecified complications: Secondary | ICD-10-CM | POA: Diagnosis not present

## 2019-02-21 DIAGNOSIS — F4321 Adjustment disorder with depressed mood: Secondary | ICD-10-CM

## 2019-02-21 DIAGNOSIS — R824 Acetonuria: Secondary | ICD-10-CM | POA: Diagnosis not present

## 2019-02-21 DIAGNOSIS — Z0000 Encounter for general adult medical examination without abnormal findings: Secondary | ICD-10-CM | POA: Diagnosis not present

## 2019-02-21 DIAGNOSIS — E538 Deficiency of other specified B group vitamins: Secondary | ICD-10-CM

## 2019-02-21 DIAGNOSIS — R8281 Pyuria: Secondary | ICD-10-CM | POA: Diagnosis not present

## 2019-02-21 DIAGNOSIS — K22710 Barrett's esophagus with low grade dysplasia: Secondary | ICD-10-CM

## 2019-02-21 DIAGNOSIS — D7289 Other specified disorders of white blood cells: Secondary | ICD-10-CM | POA: Diagnosis not present

## 2019-02-21 DIAGNOSIS — D500 Iron deficiency anemia secondary to blood loss (chronic): Secondary | ICD-10-CM

## 2019-02-21 DIAGNOSIS — G912 (Idiopathic) normal pressure hydrocephalus: Secondary | ICD-10-CM | POA: Insufficient documentation

## 2019-02-21 DIAGNOSIS — J634 Siderosis: Secondary | ICD-10-CM

## 2019-02-21 MED ORDER — LISINOPRIL 5 MG PO TABS: 5.0000 mg | ORAL_TABLET | Freq: Every day | ORAL | 1 refills | Status: AC

## 2019-02-21 NOTE — Assessment & Plan Note (Signed)
Doing OK right now. Continue to monitor. Call with any concerns. Continue to monitor.

## 2019-02-21 NOTE — Assessment & Plan Note (Signed)
Rechecking levels today. Await results. Treat as needed. Call with any concerns.

## 2019-02-21 NOTE — Assessment & Plan Note (Signed)
Under good control on current regimen. Continue current regimen. Continue to monitor. Call with any concerns. Refills given. Continue to follow with pulmonology as needed.

## 2019-02-21 NOTE — Assessment & Plan Note (Signed)
Stable. Continue to follow with GI. Call with any concerns.

## 2019-02-21 NOTE — Assessment & Plan Note (Signed)
Following with neurology- to have another MRI. Await results. Continue to follow with neurology.

## 2019-02-21 NOTE — Assessment & Plan Note (Signed)
Will keep BP and cholesterol under good control. Continue to monitor.  

## 2019-02-21 NOTE — Assessment & Plan Note (Signed)
Under good control on current regimen. Continue current regimen. Continue to monitor. Call with any concerns. Refills given. Labs drawn today.   

## 2019-02-21 NOTE — Progress Notes (Signed)
BP 137/83    Pulse 90    Temp 99.2 F (37.3 C) (Oral)    Ht 4' 11"  (1.499 m)    Wt 159 lb (72.1 kg)    LMP 06/03/1972    SpO2 91%    BMI 32.11 kg/m    Subjective:    Patient ID: Joanna Rodriguez, female    DOB: [birth year], 68 y.o.   MRN: [id number]  HPI: Joanna Rodriguez is a 68 y.o. female presenting on 02/21/2019 for comprehensive medical examination. Current medical complaints include:  HYPERTENSION Hypertension status: controlled  Satisfied with current treatment? yes Duration of hypertension: chronic BP monitoring frequency:  not checking BP medication side effects:  no Medication compliance: excellent compliance Previous BP meds: lisinopril Aspirin: no Recurrent headaches: no Visual changes: no Palpitations: no Dyspnea: no Chest pain: no Lower extremity edema: no Dizzy/lightheaded: no  HYPOTHYROIDISM Thyroid control status:controlled Satisfied with current treatment? yes Medication side effects: no Medication compliance: excellent compliance Recent dose adjustment:no Fatigue: no Cold intolerance: no Heat intolerance: no Weight gain: yes Weight loss: no Constipation: no Diarrhea/loose stools: no Palpitations: no Lower extremity edema: no Anxiety/depressed mood: no  ANXIETY/STRESS Duration:controlled Anxious mood: no  Excessive worrying: no Irritability: no  Sweating: no Nausea: no Palpitations:no Hyperventilation: no Panic attacks: no Agoraphobia: no  Obscessions/compulsions: no Depressed mood: no Depression screen [facility] 2/9 02/21/2019 01/05/2019 11/02/2018 07/06/2018 10/27/2017  Decreased Interest 0 0 0 1 1  Down, Depressed, Hopeless 1 0 0 1 1  PHQ - 2 Score 1 0 0 2 2  Altered sleeping 0 0 - 0 1  Tired, decreased energy 1 1 - 1 1  Change in appetite 2 0 - 0 1  Feeling bad or failure about yourself  1 0 - 0 1  Trouble concentrating 1 0 - 0 1  Moving slowly or fidgety/restless 0 0 - 0 1  Suicidal thoughts 0 0 - 0 1  PHQ-9 Score 6 1 - 3 9    Difficult doing work/chores Somewhat difficult Not difficult at all - Somewhat difficult Somewhat difficult   GAD 7 : Generalized Anxiety Score 02/21/2019  Nervous, Anxious, on Edge 0  Control/stop worrying 0  Worry too much - different things 0  Trouble relaxing 0  Restless 0  Easily annoyed or irritable 0  Anxiety Difficulty Not difficult at all   Anhedonia: no Weight changes: no Insomnia: no   Hypersomnia: no Fatigue/loss of energy: no Feelings of worthlessness: no Feelings of guilt: no Impaired concentration/indecisiveness: no Suicidal ideations: no  Crying spells: no Recent Stressors/Life Changes: yes   Relationship problems: no   Family stress: yes     Financial stress: no    Job stress: no    Recent death/loss: no  Menopausal Symptoms: no  Depression Screen done today and results listed below:  Depression screen [facility] 2/9 02/21/2019 01/05/2019 11/02/2018 07/06/2018 10/27/2017  Decreased Interest 0 0 0 1 1  Down, Depressed, Hopeless 1 0 0 1 1  PHQ - 2 Score 1 0 0 2 2  Altered sleeping 0 0 - 0 1  Tired, decreased energy 1 1 - 1 1  Change in appetite 2 0 - 0 1  Feeling bad or failure about yourself  1 0 - 0 1  Trouble concentrating 1 0 - 0 1  Moving slowly or fidgety/restless 0 0 - 0 1  Suicidal thoughts 0 0 - 0 1  PHQ-9 Score 6 1 - 3 9  Difficult doing work/chores Somewhat difficult  Not difficult at all - Somewhat difficult Somewhat difficult    Past Medical History:  Past Medical History:  Diagnosis Date   Arthritis    Cancer ([place])    face   Complication of anesthesia    all anesthesia events in 2019 have worsened vertigo issues   COPD (chronic obstructive pulmonary disease) (HCC)    Crohn's disease ([place])    1989 ?   Dyspnea on exertion    Gastritis    Genital herpes    H/O pneumothorax    x 2   History of kidney stones    Hypertension    Hypothyroid    Intestinal metaplasia of gastric mucosa    Kidney stones    Osteopenia    Personal  history of tobacco use, presenting hazards to health 10/23/2015   Polyp of sigmoid colon    Scoliosis    Spontaneous pneumothorax    Trouble swallowing    Vertigo        Volvulus ([place])     Surgical History:  Past Surgical History:  Procedure Laterality Date   ABDOMINAL HYSTERECTOMY     APPENDECTOMY     BREAST EXCISIONAL BIOPSY Bilateral 1990's   NEG   CAPSULOTOMY METATARSOPHALANGEAL Left 03/19/2016   Procedure: CAPSULOTOMY METATARSOPHALANGEAL;  Surgeon: Albertine Patricia, DPM;  Location: [place];  Service: Podiatry;  Laterality: Left;  Second left toe.   COLONOSCOPY WITH PROPOFOL N/A 08/05/2017   Procedure: COLONOSCOPY WITH PROPOFOL;  Surgeon: Lucilla Lame, MD;  Location: [place];  Service: Endoscopy;  Laterality: N/A;   COLONOSCOPY WITH PROPOFOL N/A 01/06/2018   Procedure: COLONOSCOPY WITH PROPOFOL;  Surgeon: Lucilla Lame, MD;  Location: [place];  Service: Endoscopy;  Laterality: N/A;   ESOPHAGOGASTRODUODENOSCOPY (EGD) WITH PROPOFOL N/A 11/21/2015   Procedure: ESOPHAGOGASTRODUODENOSCOPY (EGD) WITH PROPOFOL with dialation;  Surgeon: Lucilla Lame, MD;  Location: [place];  Service: Endoscopy;  Laterality: N/A;   ESOPHAGOGASTRODUODENOSCOPY (EGD) WITH PROPOFOL N/A 01/06/2018   Procedure: ESOPHAGOGASTRODUODENOSCOPY (EGD) WITH biopsy.;  Surgeon: Lucilla Lame, MD;  Location: [place];  Service: Endoscopy;  Laterality: N/A;   ESOPHAGOGASTRODUODENOSCOPY (EGD) WITH PROPOFOL N/A 03/03/2018   Procedure: ESOPHAGOGASTRODUODENOSCOPY (EGD) WITH PROPOFOL with biopsies;  Surgeon: Lucilla Lame, MD;  Location: [place];  Service: Endoscopy;  Laterality: N/A;   FLEXOR TENDON REPAIR Left 03/19/2016   Procedure: FLEXOR TENDON release, percutaneous.;  Surgeon: Albertine Patricia, DPM;  Location: [place];  Service: Podiatry;  Laterality: Left;  4th left toe.   HAMMER TOE SURGERY Left 03/19/2016   Procedure: HAMMER TOE CORRECTION  with PIPjoint fusion;  Surgeon: Albertine Patricia, DPM;  Location: [place];  Service: Podiatry;  Laterality: Left;  Third left toe.   HAMMER TOE SURGERY Right 08/06/2016   Procedure: HAMMER TOE CORRECTION  RIGHT 2ND AND 3RD;  Surgeon: Albertine Patricia, DPM;  Location: [place];  Service: Podiatry;  Laterality: Right;  LMA with local Special Needs:  [place]  [employer] Needs to be 2nd patient per office   LAPAROSCOPIC RIGHT COLECTOMY Right 08/27/2017   Procedure: LAPAROSCOPIC RIGHT COLECTOMY;  Surgeon: Robert Bellow, MD;  Location: [facility];  Service: General;  Laterality: Right;   POLYPECTOMY  08/05/2017   Procedure: POLYPECTOMY;  Surgeon: Lucilla Lame, MD;  Location: [place];  Service: Endoscopy;;   POLYPECTOMY  01/06/2018   Procedure: POLYPECTOMY INTESTINAL;  Surgeon: Lucilla Lame, MD;  Location: [place];  Service: Endoscopy;;   TALC PLEURODESIS  07/2014   left side  THORACOSCOPY  07/2014   left side   TUBAL LIGATION     VOLVULUS REDUCTION  1992   [place], with small bowel resection   WEIL OSTEOTOMY Right 08/06/2016   Procedure: WEIL right 2nd & 3rd;  Surgeon: Albertine Patricia, DPM;  Location: [place];  Service: Podiatry;  Laterality: Right;    Medications:  Current Outpatient Medications on File Prior to Visit  Medication Sig   acyclovir (ZOVIRAX) 400 MG tablet Take 400 mg by mouth 5 (five) times daily. One a day   albuterol (VENTOLIN HFA) 108 (90 Base) MCG/ACT inhaler Inhale 2 puffs into the lungs every 6 (six) hours as needed for wheezing or shortness of breath.   aspirin EC 81 MG tablet Take 81 mg by mouth daily.   calcium carbonate (OSCAL) 1500 (600 Ca) MG TABS tablet Take by mouth 2 (two) times daily with a meal.   Glucosamine-Chondroit-Vit C-Mn (GLUCOSAMINE-CHONDROITIN) TABS Take by mouth.   levothyroxine (SYNTHROID, LEVOTHROID) 100 MCG tablet Take 1 tablet (100 mcg total) by mouth daily.   Melatonin 5 MG TABS  Take 10 mg by mouth at bedtime as needed (for sleep.).    mercaptopurine (PURINETHOL) 50 MG tablet TAKE 1 AND 1/2 TABLETS EVERY DAY   Multiple Vitamins-Minerals (MULTIVITAMIN WITH MINERALS) tablet Take 1 tablet by mouth daily. One-A-Day for 50+   pantoprazole (PROTONIX) 40 MG tablet TAKE 1 TABLET BY MOUTH TWICE A DAY   Polyethyl Glycol-Propyl Glycol (SYSTANE) 0.4-0.3 % SOLN Apply to eye.   tiZANidine (ZANAFLEX) 2 MG tablet Take by mouth.   vitamin B-12 (CYANOCOBALAMIN) 1000 MCG tablet Take 1,000 mcg by mouth daily.   No current facility-administered medications on file prior to visit.     Allergies:  Allergies  Allergen Reactions   Nsaids     Other reaction(s): Caused Crohn's flare up.    Social History:  Social History   Socioeconomic History   Marital status: Divorced    Spouse name: Not on file   Number of children: Not on file   Years of education: GED, LPN, pharmacy tech    Highest education level: Associate degree: occupational, Hotel manager, or vocational program  Occupational History   Occupation: retired    Comment: Forensic scientist Rep - [employer] resource strain: Not hard at all   Food insecurity    Worry: Never true    Inability: Never true   Transportation needs    Medical: No    Non-medical: No  Tobacco Use   Smoking status: Former Smoker    Packs/day: 1.50    Years: 53.00    Pack years: 79.50    Types: Cigarettes    Quit date: 08/22/2014    Years since quitting: 4.5   Smokeless tobacco: Never Used  Substance and Sexual Activity   Alcohol use: Not Currently    Alcohol/week: 0.0 standard drinks    Comment: occasional - 2-3x a year    Drug use: No   Sexual activity: Never  Lifestyle   Physical activity    Days per week: 3 days    Minutes per session: 30 min   Stress: Not at all  Relationships   Social connections    Talks on phone: More than three times a week    Gets together: More than three  times a week    Attends religious service: Never    Active member of club or organization: Yes    Attends meetings of clubs or organizations: More than  4 times per year    Relationship status: Divorced   Intimate partner violence    Fear of current or ex partner: No    Emotionally abused: No    Physically abused: No    Forced sexual activity: No  Other Topics Concern   Not on file  Social History Narrative   Volunteers at senior center    Social History   Tobacco Use  Smoking Status Former Smoker   Packs/day: 1.50   Years: 53.00   Pack years: 79.50   Types: Cigarettes   Quit date: 08/22/2014   Years since quitting: 4.5  Smokeless Tobacco Never Used   Social History   Substance and Sexual Activity  Alcohol Use Not Currently   Alcohol/week: 0.0 standard drinks   Comment: occasional - 2-3x a year     Family History:  Family History  Problem Relation Age of Onset   Hypertension Mother    Hypothyroidism Mother    Alcohol abuse Mother    Breast cancer Mother 57   Hyperlipidemia Mother    Mental illness Mother    Heart attack Father    Heart disease Father    Glaucoma Father    Colon cancer Father 49   Diabetes Father    Hypertension Father    Heart disease Sister    Hyperlipidemia Sister    Hypertension Sister    Lung disease Sister     Past medical history, surgical history, medications, allergies, family history and social history reviewed with patient today and changes made to appropriate areas of the chart.   Review of Systems  Constitutional: Negative.   HENT: Positive for hearing loss. Negative for congestion, ear discharge, ear pain, nosebleeds, sinus pain, sore throat and tinnitus.   Eyes: Negative.   Respiratory: Positive for cough and shortness of breath. Negative for hemoptysis, sputum production, wheezing and stridor.   Cardiovascular: Negative.   Gastrointestinal: Positive for heartburn. Negative for abdominal pain, blood  in stool, constipation, diarrhea, melena, nausea and vomiting.  Genitourinary: Positive for urgency. Negative for dysuria, flank pain, frequency and hematuria.       Incontinence- comes on too quick   Musculoskeletal: Negative.   Skin: Negative.   Neurological: Negative.   Endo/Heme/Allergies: Positive for environmental allergies. Negative for polydipsia. Does not bruise/bleed easily.  Psychiatric/Behavioral: Negative.     All other ROS negative except what is listed above and in the HPI.      Objective:    BP 137/83    Pulse 90    Temp 99.2 F (37.3 C) (Oral)    Ht 4' 11"  (1.499 m)    Wt 159 lb (72.1 kg)    LMP 06/03/1972    SpO2 91%    BMI 32.11 kg/m   Wt Readings from Last 3 Encounters:  02/21/19 159 lb (72.1 kg)  02/04/19 159 lb (72.1 kg)  01/30/19 159 lb (72.1 kg)    Physical Exam Vitals signs and nursing note reviewed.  Constitutional:      General: She is not in acute distress.    Appearance: Normal appearance. She is not ill-appearing, toxic-appearing or diaphoretic.  HENT:     Head: Normocephalic and atraumatic.     Right Ear: Tympanic membrane, ear canal and external ear normal. There is no impacted cerumen.     Left Ear: Tympanic membrane, ear canal and external ear normal. There is no impacted cerumen.     Nose: Nose normal. No congestion or rhinorrhea.     Mouth/Throat:  Mouth: Mucous membranes are moist.     Pharynx: Oropharynx is clear. No oropharyngeal exudate or posterior oropharyngeal erythema.  Eyes:     General: No scleral icterus.       Right eye: No discharge.        Left eye: No discharge.     Extraocular Movements: Extraocular movements intact.     Conjunctiva/sclera: Conjunctivae normal.     Pupils: Pupils are equal, round, and reactive to light.  Neck:     Musculoskeletal: Normal range of motion and neck supple. No neck rigidity or muscular tenderness.     Vascular: No carotid bruit.  Cardiovascular:     Rate and Rhythm: Normal rate and  regular rhythm.     Pulses: Normal pulses.     Heart sounds: No murmur. No friction rub. No gallop.   Pulmonary:     Effort: Pulmonary effort is normal. No respiratory distress.     Breath sounds: Normal breath sounds. No stridor. No wheezing, rhonchi or rales.  Chest:     Chest wall: No tenderness.  Abdominal:     General: Abdomen is flat. Bowel sounds are normal. There is no distension.     Palpations: Abdomen is soft. There is no mass.     Tenderness: There is no abdominal tenderness. There is no right CVA tenderness, left CVA tenderness, guarding or rebound.     Hernia: No hernia is present.  Genitourinary:    Comments: Breast and pelvic exams deferred with shared decision making Musculoskeletal: Normal range of motion.        General: No swelling, tenderness, deformity or signs of injury.     Right lower leg: No edema.     Left lower leg: No edema.  Lymphadenopathy:     Cervical: No cervical adenopathy.  Skin:    General: Skin is warm and dry.     Capillary Refill: Capillary refill takes less than 2 seconds.     Coloration: Skin is not jaundiced or pale.     Findings: No bruising, erythema, lesion or rash.     Comments: Hyperpigmented Moles on R hand and 2 on R outer arm  Neurological:     General: No focal deficit present.     Mental Status: She is alert and oriented to person, place, and time. Mental status is at baseline.     Cranial Nerves: No cranial nerve deficit.     Sensory: No sensory deficit.     Motor: No weakness.     Coordination: Coordination normal.     Gait: Gait normal.     Deep Tendon Reflexes: Reflexes normal.  Psychiatric:        Mood and Affect: Mood normal.        Behavior: Behavior normal.        Thought Content: Thought content normal.        Judgment: Judgment normal.     Results for orders placed or performed in visit on 01/30/19  Microscopic Examination   URINE  Result Value Ref Range   WBC, UA 0-5 0 - 5 /hpf   RBC None seen 0 - 2 /hpf     Epithelial Cells (non renal) 0-10 0 - 10 /hpf   Bacteria, UA Few (A) None seen/Few  Urine Culture, Reflex   URINE  Result Value Ref Range   Urine Culture, Routine Final report    Organism ID, Bacteria Comment   Comprehensive metabolic panel  Result Value Ref Range   Glucose  88 65 - 99 mg/dL   BUN 15 8 - 27 mg/dL   Creatinine, Ser 0.73 0.57 - 1.00 mg/dL   GFR calc non Af Amer 84 >59 mL/min/1.73   GFR calc Af Amer 96 >59 mL/min/1.73   BUN/Creatinine Ratio 21 12 - 28   Sodium 142 134 - 144 mmol/L   Potassium 4.6 3.5 - 5.2 mmol/L   Chloride 101 96 - 106 mmol/L   CO2 23 20 - 29 mmol/L   Calcium 9.5 8.7 - 10.3 mg/dL   Total Protein 6.8 6.0 - 8.5 g/dL   Albumin 4.2 3.8 - 4.8 g/dL   Globulin, Total 2.6 1.5 - 4.5 g/dL   Albumin/Globulin Ratio 1.6 1.2 - 2.2   Bilirubin Total 0.3 0.0 - 1.2 mg/dL   Alkaline Phosphatase 64 39 - 117 IU/L   AST 35 0 - 40 IU/L   ALT 49 (H) 0 - 32 IU/L  Lipid Panel w/o Chol/HDL Ratio  Result Value Ref Range   Cholesterol, Total 170 100 - 199 mg/dL   Triglycerides 209 (H) 0 - 149 mg/dL   HDL 36 (L) >39 mg/dL   VLDL Cholesterol Cal 42 (H) 5 - 40 mg/dL   LDL Calculated 92 0 - 99 mg/dL  TSH  Result Value Ref Range   TSH 2.440 0.450 - 4.500 uIU/mL  UA/M w/rflx Culture, Routine   Specimen: Urine   URINE  Result Value Ref Range   Specific Gravity, UA <1.005 (L) 1.005 - 1.030   pH, UA 5.0 5.0 - 7.5   Color, UA Yellow Yellow   Appearance Ur Clear Clear   Leukocytes,UA Negative Negative   Protein,UA Negative Negative/Trace   Glucose, UA Negative Negative   Ketones, UA Negative Negative   RBC, UA Negative Negative   Bilirubin, UA Negative Negative   Urobilinogen, Ur 0.2 0.2 - 1.0 mg/dL   Nitrite, UA Positive (A) Negative   Microscopic Examination See below:    Urinalysis Reflex Comment   VITAMIN D 25 Hydroxy (Vit-D Deficiency, Fractures)  Result Value Ref Range   Vit D, 25-Hydroxy 76.7 30.0 - 100.0 ng/mL  B12 and Folate Panel  Result Value Ref  Range   Vitamin B-12 1,851 (H) 232 - 1,245 pg/mL   Folate >20.0 >3.0 ng/mL  CBC With Differential/Platelet  Result Value Ref Range   WBC 6.4 3.4 - 10.8 x10E3/uL   RBC 3.98 3.77 - 5.28 x10E6/uL   Hemoglobin 14.3 11.1 - 15.9 g/dL   Hematocrit 40.3 34.0 - 46.6 %   MCV 101 (H) 79 - 97 fL   MCH 35.9 (H) 26.6 - 33.0 pg   MCHC 35.5 31.5 - 35.7 g/dL   RDW 14.3 11.7 - 15.4 %   Platelets 374 150 - 450 x10E3/uL   Neutrophils 62 Not Estab. %   Lymphs 29 Not Estab. %   MID 9 Not Estab. %   Neutrophils Absolute 4.0 1.4 - 7.0 x10E3/uL   Lymphocytes Absolute 1.8 0.7 - 3.1 x10E3/uL   MID (Absolute) 0.6 0.1 - 1.6 X10E3/uL      Assessment & Plan:   Problem List Items Addressed This Visit      Cardiovascular and Mediastinum   HTN (hypertension)    Under good control on current regimen. Continue current regimen. Continue to monitor. Call with any concerns. Refills given. Labs drawn today.       Relevant Medications   lisinopril (ZESTRIL) 5 MG tablet   CAD (coronary artery disease)    Will keep BP and  cholesterol under good control. Continue to monitor.       Relevant Medications   lisinopril (ZESTRIL) 5 MG tablet   Other Relevant Orders   CBC with Differential/Platelet   Comprehensive metabolic panel   UA/M w/rflx Culture, Routine     Respiratory   COPD (chronic obstructive pulmonary disease) ([place])    Under good control on current regimen. Continue current regimen. Continue to monitor. Call with any concerns. Refills given. Continue to follow with pulmonology as needed.        Relevant Orders   CBC with Differential/Platelet   Comprehensive metabolic panel   UA/M w/rflx Culture, Routine     Digestive   CC (Crohn's colitis) ([place])    Stable. Continue to follow with GI. Call with any concerns.       Relevant Orders   CBC with Differential/Platelet   Comprehensive metabolic panel   UA/M w/rflx Culture, Routine   Barrett's esophagus with low grade dysplasia     Endocrine    Thyroid activity decreased    Rechecking levels today. Await results. Treat as needed. Call with any concerns.       Relevant Orders   CBC with Differential/Platelet   Comprehensive metabolic panel   TSH   UA/M w/rflx Culture, Routine     Nervous and Auditory   Normal pressure hydrocephalus (HCC)    Following with neurology- to have another MRI. Await results. Continue to follow with neurology.        Other   B12 deficiency    Rechecking levels today. Await results. Treat as needed. Call with any concerns.       Relevant Orders   CBC with Differential/Platelet   Comprehensive metabolic panel   UA/M w/rflx Culture, Routine   B12   Adjustment disorder with depressed mood    Doing OK right now. Continue to monitor. Call with any concerns. Continue to monitor.       Relevant Orders   CBC with Differential/Platelet   Comprehensive metabolic panel   UA/M w/rflx Culture, Routine   Iron deficiency anemia due to chronic blood loss    Rechecking levels today. Await results. Treat as needed. Call with any concerns.       Relevant Orders   CBC with Differential/Platelet   Comprehensive metabolic panel   Iron and TIBC   UA/M w/rflx Culture, Routine   Ferritin   Superficial siderosis present on magnetic resonance imaging [facility])    Following with neurology- to have another MRI. Await results. Continue to follow with neurology.       Other Visit Diagnoses    Routine general medical examination at a health care facility    -  Primary   Vaccines up to date. Screening labs checked today. Mammogram ordered. Colonoscopy 1 year. Pap N/A. DEXA up to date. Continue diet and exercise. Call w/ concerns   Screening for cholesterol level       Labs drawn today. Await results.    Relevant Orders   Lipid Panel w/o Chol/HDL Ratio   Flu vaccine need       Flu shot given today.   Relevant Orders   Flu Vaccine QUAD High Dose(Fluad) (Completed)   Screening for breast cancer       Mammogram  ordered today.   Relevant Orders   MM DIGITAL SCREENING BILATERAL       Follow up plan: Return ASAP shave biopsy.   LABORATORY TESTING:  - Pap smear: not applicable  IMMUNIZATIONS:   - Tdap:  Tetanus vaccination status reviewed: last tetanus booster within 10 years. - Influenza: Administered today - Pneumovax: Up to date - Prevnar: Up to date  SCREENING: -Mammogram: Ordered today  - Colonoscopy: to get next year due to COVID  - Bone Density: Up to date   PATIENT COUNSELING:   Advised to take 1 mg of folate supplement per day if capable of pregnancy.   Sexuality: Discussed sexually transmitted diseases, partner selection, use of condoms, avoidance of unintended pregnancy  and contraceptive alternatives.   Advised to avoid cigarette smoking.  I discussed with the patient that most people either abstain from alcohol or drink within safe limits (<=14/week and <=4 drinks/occasion for males, <=7/weeks and <= 3 drinks/occasion for females) and that the risk for alcohol disorders and other health effects rises proportionally with the number of drinks per week and how often a drinker exceeds daily limits.  Discussed cessation/primary prevention of drug use and availability of treatment for abuse.   Diet: Encouraged to adjust caloric intake to maintain  or achieve ideal body weight, to reduce intake of dietary saturated fat and total fat, to limit sodium intake by avoiding high sodium foods and not adding table salt, and to maintain adequate dietary potassium and calcium preferably from fresh fruits, vegetables, and low-fat dairy products.    stressed the importance of regular exercise  Injury prevention: Discussed safety belts, safety helmets, smoke detector, smoking near bedding or upholstery.   Dental health: Discussed importance of regular tooth brushing, flossing, and dental visits.    NEXT PREVENTATIVE PHYSICAL DUE IN 1 YEAR. Return ASAP shave biopsy.

## 2019-02-22 ENCOUNTER — Other Ambulatory Visit: Payer: Medicare Other

## 2019-02-22 DIAGNOSIS — C182 Malignant neoplasm of ascending colon: Secondary | ICD-10-CM | POA: Diagnosis not present

## 2019-02-22 DIAGNOSIS — M8580 Other specified disorders of bone density and structure, unspecified site: Secondary | ICD-10-CM | POA: Diagnosis not present

## 2019-02-22 DIAGNOSIS — E039 Hypothyroidism, unspecified: Secondary | ICD-10-CM | POA: Diagnosis not present

## 2019-02-22 DIAGNOSIS — D538 Other specified nutritional anemias: Secondary | ICD-10-CM | POA: Diagnosis not present

## 2019-02-22 DIAGNOSIS — Z9181 History of falling: Secondary | ICD-10-CM | POA: Diagnosis not present

## 2019-02-22 DIAGNOSIS — G3281 Cerebellar ataxia in diseases classified elsewhere: Secondary | ICD-10-CM | POA: Diagnosis not present

## 2019-02-22 DIAGNOSIS — D500 Iron deficiency anemia secondary to blood loss (chronic): Secondary | ICD-10-CM | POA: Diagnosis not present

## 2019-02-22 DIAGNOSIS — R413 Other amnesia: Secondary | ICD-10-CM | POA: Diagnosis not present

## 2019-02-22 DIAGNOSIS — R296 Repeated falls: Secondary | ICD-10-CM | POA: Diagnosis not present

## 2019-02-22 LAB — FERRITIN: Ferritin: 23 ng/mL (ref 15–150)

## 2019-02-22 LAB — CBC WITH DIFFERENTIAL/PLATELET
Basophils Absolute: 0.1 10*3/uL (ref 0.0–0.2)
Basos: 1 %
EOS (ABSOLUTE): 0 10*3/uL (ref 0.0–0.4)
Eos: 1 %
Hematocrit: 39.2 % (ref 34.0–46.6)
Hemoglobin: 13.5 g/dL (ref 11.1–15.9)
Immature Grans (Abs): 0 10*3/uL (ref 0.0–0.1)
Immature Granulocytes: 0 %
Lymphocytes Absolute: 1.9 10*3/uL (ref 0.7–3.1)
Lymphs: 30 %
MCH: 33.8 pg — ABNORMAL HIGH (ref 26.6–33.0)
MCHC: 34.4 g/dL (ref 31.5–35.7)
MCV: 98 fL — ABNORMAL HIGH (ref 79–97)
Monocytes Absolute: 0.7 10*3/uL (ref 0.1–0.9)
Monocytes: 10 %
Neutrophils Absolute: 3.6 10*3/uL (ref 1.4–7.0)
Neutrophils: 58 %
Platelets: 410 10*3/uL (ref 150–450)
RBC: 3.99 x10E6/uL (ref 3.77–5.28)
RDW: 13.4 % (ref 11.7–15.4)
WBC: 6.3 10*3/uL (ref 3.4–10.8)

## 2019-02-22 LAB — COMPREHENSIVE METABOLIC PANEL
ALT: 53 IU/L — ABNORMAL HIGH (ref 0–32)
AST: 34 IU/L (ref 0–40)
Albumin/Globulin Ratio: 1.4 (ref 1.2–2.2)
Albumin: 3.9 g/dL (ref 3.8–4.8)
Alkaline Phosphatase: 58 IU/L (ref 39–117)
BUN/Creatinine Ratio: 20 (ref 12–28)
BUN: 16 mg/dL (ref 8–27)
Bilirubin Total: 0.3 mg/dL (ref 0.0–1.2)
CO2: 26 mmol/L (ref 20–29)
Calcium: 10.4 mg/dL — ABNORMAL HIGH (ref 8.7–10.3)
Chloride: 101 mmol/L (ref 96–106)
Creatinine, Ser: 0.8 mg/dL (ref 0.57–1.00)
GFR calc Af Amer: 86 mL/min/{1.73_m2} (ref >59)
GFR calc non Af Amer: 75 mL/min/{1.73_m2} (ref >59)
Globulin, Total: 2.7 g/dL (ref 1.5–4.5)
Glucose: 93 mg/dL (ref 65–99)
Potassium: 4.7 mmol/L (ref 3.5–5.2)
Sodium: 143 mmol/L (ref 134–144)
Total Protein: 6.6 g/dL (ref 6.0–8.5)

## 2019-02-22 LAB — TSH: TSH: 1.96 u[IU]/mL (ref 0.450–4.500)

## 2019-02-22 LAB — LIPID PANEL W/O CHOL/HDL RATIO
Cholesterol, Total: 183 mg/dL (ref 100–199)
HDL: 36 mg/dL — ABNORMAL LOW (ref >39)
LDL Calculated: 93 mg/dL (ref 0–99)
Triglycerides: 269 mg/dL — ABNORMAL HIGH (ref 0–149)
VLDL Cholesterol Cal: 54 mg/dL — ABNORMAL HIGH (ref 5–40)

## 2019-02-22 LAB — IRON AND TIBC
Iron Saturation: 26 % (ref 15–55)
Iron: 78 ug/dL (ref 27–139)
Total Iron Binding Capacity: 303 ug/dL (ref 250–450)
UIBC: 225 ug/dL (ref 118–369)

## 2019-02-22 LAB — VITAMIN B12: Vitamin B-12: 1541 pg/mL — ABNORMAL HIGH (ref 232–1245)

## 2019-02-25 LAB — UA/M W/RFLX CULTURE, ROUTINE
Bilirubin, UA: NEGATIVE
Glucose, UA: NEGATIVE
Nitrite, UA: NEGATIVE
Protein,UA: NEGATIVE
RBC, UA: NEGATIVE
Specific Gravity, UA: 1.025 (ref 1.005–1.030)
Urobilinogen, Ur: 0.2 mg/dL (ref 0.2–1.0)
pH, UA: 5.5 (ref 5.0–7.5)

## 2019-02-25 LAB — URINE CULTURE, REFLEX

## 2019-02-25 LAB — MICROSCOPIC EXAMINATION: RBC, Urine: NONE SEEN /hpf (ref 0–2)

## 2019-02-28 DIAGNOSIS — Z9181 History of falling: Secondary | ICD-10-CM | POA: Diagnosis not present

## 2019-02-28 DIAGNOSIS — D500 Iron deficiency anemia secondary to blood loss (chronic): Secondary | ICD-10-CM | POA: Diagnosis not present

## 2019-02-28 DIAGNOSIS — R296 Repeated falls: Secondary | ICD-10-CM | POA: Diagnosis not present

## 2019-02-28 DIAGNOSIS — C182 Malignant neoplasm of ascending colon: Secondary | ICD-10-CM | POA: Diagnosis not present

## 2019-02-28 DIAGNOSIS — D538 Other specified nutritional anemias: Secondary | ICD-10-CM | POA: Diagnosis not present

## 2019-02-28 DIAGNOSIS — M8580 Other specified disorders of bone density and structure, unspecified site: Secondary | ICD-10-CM | POA: Diagnosis not present

## 2019-02-28 DIAGNOSIS — R413 Other amnesia: Secondary | ICD-10-CM | POA: Diagnosis not present

## 2019-02-28 DIAGNOSIS — G3281 Cerebellar ataxia in diseases classified elsewhere: Secondary | ICD-10-CM | POA: Diagnosis not present

## 2019-02-28 DIAGNOSIS — E039 Hypothyroidism, unspecified: Secondary | ICD-10-CM | POA: Diagnosis not present

## 2019-03-07 ENCOUNTER — Other Ambulatory Visit (HOSPITAL_COMMUNITY): Payer: Self-pay | Admitting: Neurology

## 2019-03-07 ENCOUNTER — Other Ambulatory Visit: Payer: Self-pay | Admitting: Neurology

## 2019-03-07 DIAGNOSIS — R2689 Other abnormalities of gait and mobility: Secondary | ICD-10-CM

## 2019-03-09 ENCOUNTER — Other Ambulatory Visit: Payer: Self-pay

## 2019-03-09 ENCOUNTER — Encounter: Payer: Self-pay | Admitting: Family Medicine

## 2019-03-09 ENCOUNTER — Ambulatory Visit (INDEPENDENT_AMBULATORY_CARE_PROVIDER_SITE_OTHER): Payer: Medicare Other | Admitting: Family Medicine

## 2019-03-09 ENCOUNTER — Other Ambulatory Visit (HOSPITAL_COMMUNITY)
Admission: RE | Admit: 2019-03-09 | Discharge: 2019-03-09 | Disposition: A | Discharge status: Home / Self Care | Payer: Medicare Other | Source: Ambulatory Visit | Attending: Family Medicine | Admitting: Family Medicine

## 2019-03-09 VITALS — BP 146/86 | HR 86 | Temp 98.7°F

## 2019-03-09 DIAGNOSIS — B079 Viral wart, unspecified: Secondary | ICD-10-CM | POA: Diagnosis not present

## 2019-03-09 DIAGNOSIS — L720 Epidermal cyst: Secondary | ICD-10-CM | POA: Diagnosis not present

## 2019-03-09 DIAGNOSIS — L570 Actinic keratosis: Secondary | ICD-10-CM | POA: Diagnosis not present

## 2019-03-09 DIAGNOSIS — D485 Neoplasm of uncertain behavior of skin: Secondary | ICD-10-CM

## 2019-03-10 ENCOUNTER — Encounter: Payer: Self-pay | Admitting: Family Medicine

## 2019-03-10 ENCOUNTER — Emergency Department: Payer: Medicare Other

## 2019-03-10 ENCOUNTER — Emergency Department
Admission: EM | Admit: 2019-03-10 | Discharge: 2019-03-10 | Disposition: A | Discharge status: Other Acute Inpatient Hospital | Payer: Medicare Other | Attending: Student | Admitting: Student

## 2019-03-10 ENCOUNTER — Encounter: Payer: Self-pay | Admitting: Emergency Medicine

## 2019-03-10 ENCOUNTER — Other Ambulatory Visit: Payer: Self-pay

## 2019-03-10 DIAGNOSIS — E039 Hypothyroidism, unspecified: Secondary | ICD-10-CM | POA: Diagnosis not present

## 2019-03-10 DIAGNOSIS — Z7982 Long term (current) use of aspirin: Secondary | ICD-10-CM | POA: Insufficient documentation

## 2019-03-10 DIAGNOSIS — I2510 Atherosclerotic heart disease of native coronary artery without angina pectoris: Secondary | ICD-10-CM | POA: Insufficient documentation

## 2019-03-10 DIAGNOSIS — R4182 Altered mental status, unspecified: Secondary | ICD-10-CM | POA: Diagnosis present

## 2019-03-10 DIAGNOSIS — I619 Nontraumatic intracerebral hemorrhage, unspecified: Secondary | ICD-10-CM | POA: Insufficient documentation

## 2019-03-10 DIAGNOSIS — Z20828 Contact with and (suspected) exposure to other viral communicable diseases: Secondary | ICD-10-CM | POA: Diagnosis not present

## 2019-03-10 DIAGNOSIS — Z03818 Encounter for observation for suspected exposure to other biological agents ruled out: Secondary | ICD-10-CM | POA: Diagnosis not present

## 2019-03-10 DIAGNOSIS — R000 Tachycardia, unspecified: Secondary | ICD-10-CM | POA: Diagnosis not present

## 2019-03-10 DIAGNOSIS — J449 Chronic obstructive pulmonary disease, unspecified: Secondary | ICD-10-CM | POA: Insufficient documentation

## 2019-03-10 DIAGNOSIS — Z87891 Personal history of nicotine dependence: Secondary | ICD-10-CM | POA: Insufficient documentation

## 2019-03-10 DIAGNOSIS — I10 Essential (primary) hypertension: Secondary | ICD-10-CM | POA: Insufficient documentation

## 2019-03-10 DIAGNOSIS — I6200 Nontraumatic subdural hemorrhage, unspecified: Secondary | ICD-10-CM | POA: Diagnosis not present

## 2019-03-10 DIAGNOSIS — R609 Edema, unspecified: Secondary | ICD-10-CM | POA: Diagnosis not present

## 2019-03-10 DIAGNOSIS — I618 Other nontraumatic intracerebral hemorrhage: Secondary | ICD-10-CM | POA: Diagnosis not present

## 2019-03-10 DIAGNOSIS — R58 Hemorrhage, not elsewhere classified: Secondary | ICD-10-CM | POA: Diagnosis not present

## 2019-03-10 DIAGNOSIS — I616 Nontraumatic intracerebral hemorrhage, multiple localized: Secondary | ICD-10-CM | POA: Diagnosis not present

## 2019-03-10 DIAGNOSIS — R531 Weakness: Secondary | ICD-10-CM | POA: Diagnosis not present

## 2019-03-10 DIAGNOSIS — R404 Transient alteration of awareness: Secondary | ICD-10-CM | POA: Diagnosis not present

## 2019-03-10 DIAGNOSIS — Z79899 Other long term (current) drug therapy: Secondary | ICD-10-CM | POA: Insufficient documentation

## 2019-03-10 DIAGNOSIS — M47812 Spondylosis without myelopathy or radiculopathy, cervical region: Secondary | ICD-10-CM | POA: Diagnosis not present

## 2019-03-10 DIAGNOSIS — G936 Cerebral edema: Secondary | ICD-10-CM | POA: Diagnosis not present

## 2019-03-10 DIAGNOSIS — R0902 Hypoxemia: Secondary | ICD-10-CM | POA: Diagnosis not present

## 2019-03-10 DIAGNOSIS — M4802 Spinal stenosis, cervical region: Secondary | ICD-10-CM | POA: Diagnosis not present

## 2019-03-10 LAB — APTT: aPTT: 39 seconds — ABNORMAL HIGH (ref 24–36)

## 2019-03-10 LAB — SARS CORONAVIRUS 2 BY RT PCR (HOSPITAL ORDER, PERFORMED IN ~~LOC~~ HOSPITAL LAB): SARS Coronavirus 2: NEGATIVE

## 2019-03-10 LAB — PROTIME-INR
INR: 1.2 (ref 0.8–1.2)
Prothrombin Time: 14.9 seconds (ref 11.4–15.2)

## 2019-03-10 MED ORDER — ONDANSETRON HCL 4 MG/2ML IJ SOLN
4.0000 mg | Freq: Once | INTRAMUSCULAR | Status: AC
  Administered 2019-03-10: 4 mg via INTRAVENOUS
  Filled 2019-03-10: qty 2

## 2019-03-10 MED ORDER — SODIUM CHLORIDE 0.9 % IV BOLUS
500.0000 mL | Freq: Once | INTRAVENOUS | Status: AC
  Administered 2019-03-10: 500 mL via INTRAVENOUS

## 2019-03-10 MED ORDER — LABETALOL HCL 5 MG/ML IV SOLN
10.0000 mg | INTRAVENOUS | Status: DC | PRN
  Filled 2019-03-10: qty 4

## 2019-03-10 MED ORDER — NICARDIPINE HCL IN NACL 20-0.86 MG/200ML-% IV SOLN
3.0000 mg/h | INTRAVENOUS | Status: DC
  Filled 2019-03-10: qty 200

## 2019-03-10 MED ORDER — FENTANYL CITRATE (PF) 100 MCG/2ML IJ SOLN
25.0000 ug | Freq: Once | INTRAMUSCULAR | Status: AC
  Administered 2019-03-10: 25 ug via INTRAVENOUS
  Filled 2019-03-10: qty 2

## 2019-03-10 MED ORDER — LEVETIRACETAM IN NACL 1000 MG/100ML IV SOLN
1000.0000 mg | Freq: Once | INTRAVENOUS | Status: DC
  Filled 2019-03-10: qty 100

## 2019-03-10 NOTE — ED Notes (Signed)
Unable to obtain IV access, IV team consulted. Lab called unable to run urine specime. Reports QNS.

## 2019-03-10 NOTE — ED Notes (Signed)
REPORTS CALLED TO RECEIVING NURSE RACHEL AT [facility] AWAITING LOGISTIC CALL BACK WITH ETA.

## 2019-03-10 NOTE — ED Notes (Signed)
Patient very irtitable in bed. Awaiting transfer to [facility]. Family at bedside. labs drawn and sent.

## 2019-03-10 NOTE — Progress Notes (Signed)
BP (!) 146/86   Pulse 86   Temp 98.7 F (37.1 C)   LMP 06/03/1972   SpO2 96%    Subjective:    Patient ID: Joanna Rodriguez, female    DOB: [birth year], 68 y.o.   MRN: [id number]  HPI: Joanna Rodriguez is a 68 y.o. female  Chief Complaint  Patient presents with  . mole removal   SKIN LESIONS Duration: months Location: R hand and arm Painful: no Itching: no Onset: gradual Context: bigger Associated signs and symptoms: the one on her hand gets crusty and will bleed History of skin cancer: yes History of precancerous skin lesions: yes Family history of skin cancer: yes   Relevant past medical, surgical, family and social history reviewed and updated as indicated. Interim medical history since our last visit reviewed. Allergies and medications reviewed and updated.  Review of Systems  Constitutional: Negative.   Respiratory: Negative.   Cardiovascular: Negative.   Skin: Negative.   Psychiatric/Behavioral: Negative.     Per HPI unless specifically indicated above     Objective:    BP (!) 146/86   Pulse 86   Temp 98.7 F (37.1 C)   LMP 06/03/1972   SpO2 96%   Wt Readings from Last 3 Encounters:  02/21/19 159 lb (72.1 kg)  02/04/19 159 lb (72.1 kg)  01/30/19 159 lb (72.1 kg)    Physical Exam Vitals signs and nursing note reviewed.  Constitutional:      General: She is not in acute distress.    Appearance: Normal appearance. She is not ill-appearing, toxic-appearing or diaphoretic.  HENT:     Head: Normocephalic and atraumatic.     Right Ear: External ear normal.     Left Ear: External ear normal.     Nose: Nose normal.     Mouth/Throat:     Mouth: Mucous membranes are moist.     Pharynx: Oropharynx is clear.  Eyes:     General: No scleral icterus.       Right eye: No discharge.        Left eye: No discharge.     Extraocular Movements: Extraocular movements intact.     Conjunctiva/sclera: Conjunctivae normal.     Pupils: Pupils are  equal, round, and reactive to light.  Neck:     Musculoskeletal: Normal range of motion and neck supple.  Cardiovascular:     Rate and Rhythm: Normal rate and regular rhythm.     Pulses: Normal pulses.     Heart sounds: Normal heart sounds. No murmur. No friction rub. No gallop.   Pulmonary:     Effort: Pulmonary effort is normal. No respiratory distress.     Breath sounds: Normal breath sounds. No stridor. No wheezing, rhonchi or rales.  Chest:     Chest wall: No tenderness.  Musculoskeletal: Normal range of motion.  Skin:    General: Skin is warm and dry.     Capillary Refill: Capillary refill takes less than 2 seconds.     Coloration: Skin is not jaundiced or pale.     Findings: No bruising, erythema, lesion or rash.     Comments: Numerous SKs on arms bilaterally  Neurological:     General: No focal deficit present.     Mental Status: She is alert and oriented to person, place, and time. Mental status is at baseline.  Psychiatric:        Mood and Affect: Mood normal.        Behavior:  Behavior normal.        Thought Content: Thought content normal.        Judgment: Judgment normal.     Results for orders placed or performed in visit on 02/21/19  Microscopic Examination   URINE  Result Value Ref Range   WBC, UA 0-5 0 - 5 /hpf   RBC None seen 0 - 2 /hpf   Epithelial Cells (non renal) 0-10 0 - 10 /hpf   Crystals Present N/A   Crystal Type Calcium Oxalate N/A   Bacteria, UA Few (A) None seen/Few  Urine Culture, Reflex   URINE  Result Value Ref Range   Urine Culture, Routine Final report    Organism ID, Bacteria Comment   CBC with Differential/Platelet  Result Value Ref Range   WBC 6.3 3.4 - 10.8 x10E3/uL   RBC 3.99 3.77 - 5.28 x10E6/uL   Hemoglobin 13.5 11.1 - 15.9 g/dL   Hematocrit 39.2 34.0 - 46.6 %   MCV 98 (H) 79 - 97 fL   MCH 33.8 (H) 26.6 - 33.0 pg   MCHC 34.4 31.5 - 35.7 g/dL   RDW 13.4 11.7 - 15.4 %   Platelets 410 150 - 450 x10E3/uL   Neutrophils 58 Not  Estab. %   Lymphs 30 Not Estab. %   Monocytes 10 Not Estab. %   Eos 1 Not Estab. %   Basos 1 Not Estab. %   Neutrophils Absolute 3.6 1.4 - 7.0 x10E3/uL   Lymphocytes Absolute 1.9 0.7 - 3.1 x10E3/uL   Monocytes Absolute 0.7 0.1 - 0.9 x10E3/uL   EOS (ABSOLUTE) 0.0 0.0 - 0.4 x10E3/uL   Basophils Absolute 0.1 0.0 - 0.2 x10E3/uL   Immature Granulocytes 0 Not Estab. %   Immature Grans (Abs) 0.0 0.0 - 0.1 x10E3/uL  Comprehensive metabolic panel  Result Value Ref Range   Glucose 93 65 - 99 mg/dL   BUN 16 8 - 27 mg/dL   Creatinine, Ser 0.80 0.57 - 1.00 mg/dL   GFR calc non Af Amer 75 >59 mL/min/1.73   GFR calc Af Amer 86 >59 mL/min/1.73   BUN/Creatinine Ratio 20 12 - 28   Sodium 143 134 - 144 mmol/L   Potassium 4.7 3.5 - 5.2 mmol/L   Chloride 101 96 - 106 mmol/L   CO2 26 20 - 29 mmol/L   Calcium 10.4 (H) 8.7 - 10.3 mg/dL   Total Protein 6.6 6.0 - 8.5 g/dL   Albumin 3.9 3.8 - 4.8 g/dL   Globulin, Total 2.7 1.5 - 4.5 g/dL   Albumin/Globulin Ratio 1.4 1.2 - 2.2   Bilirubin Total 0.3 0.0 - 1.2 mg/dL   Alkaline Phosphatase 58 39 - 117 IU/L   AST 34 0 - 40 IU/L   ALT 53 (H) 0 - 32 IU/L  Iron and TIBC  Result Value Ref Range   Total Iron Binding Capacity 303 250 - 450 ug/dL   UIBC 225 118 - 369 ug/dL   Iron 78 27 - 139 ug/dL   Iron Saturation 26 15 - 55 %  Lipid Panel w/o Chol/HDL Ratio  Result Value Ref Range   Cholesterol, Total 183 100 - 199 mg/dL   Triglycerides 269 (H) 0 - 149 mg/dL   HDL 36 (L) >39 mg/dL   VLDL Cholesterol Cal 54 (H) 5 - 40 mg/dL   LDL Calculated 93 0 - 99 mg/dL  TSH  Result Value Ref Range   TSH 1.960 0.450 - 4.500 uIU/mL  UA/M w/rflx Culture, Routine  Specimen: Urine   URINE  Result Value Ref Range   Specific Gravity, UA 1.025 1.005 - 1.030   pH, UA 5.5 5.0 - 7.5   Color, UA Yellow Yellow   Appearance Ur Clear Clear   Leukocytes,UA Trace (A) Negative   Protein,UA Negative Negative/Trace   Glucose, UA Negative Negative   Ketones, UA Trace (A)  Negative   RBC, UA Negative Negative   Bilirubin, UA Negative Negative   Urobilinogen, Ur 0.2 0.2 - 1.0 mg/dL   Nitrite, UA Negative Negative   Microscopic Examination See below:    Urinalysis Reflex Comment   B12  Result Value Ref Range   Vitamin B-12 1,541 (H) 232 - 1,245 pg/mL  Ferritin  Result Value Ref Range   Ferritin 23 15 - 150 ng/mL      Assessment & Plan:   Problem List Items Addressed This Visit    None    Visit Diagnoses    Neoplasm of uncertain behavior of skin    -  Primary   Removed today as below. Sent for pathology. Await results.    Relevant Orders   Dermatology pathology   Dermatology pathology   Dermatology pathology      Skin Procedure  Procedure: Informed consent given.  Sterile prep of the area.  Area infiltrated with lidocaine with epinephrine.  Using a surgical blade, part of the upper dermis shaved off and sent  for pathology.  Area cauterized. Pt ed on scarring     Diagnosis:   ICD-10-CM   1. Neoplasm of uncertain behavior of skin  D48.5 Dermatology pathology    Dermatology pathology    Dermatology pathology   Removed today as below. Sent for pathology. Await results.     Lesion Location/Size: R proximal forearm hyperpigmented lesion with area of darker within about 1 cm, R distal forearm hyperpigmented lesion about 70m, nodule on the R hand about 528m consistent with  Physician: MJ Consent:  Risks, benefits, and alternative treatments discussed and all questions were answered.  Patient elected to proceed and verbal consent obtained.  Description: Area prepped and draped using semi-sterile technique. Area locally anesthetized using 9 cc's of lidocaine 1% with epi. Shave biopsy of lesion performed using a dermablade.  Adequate hemostastis achieved using Silver Nitrate. Wound dressed after application of bacitracin ointment.  Instructions:  Wound care instructions discussed and patient was instructed to keep area clean and dry.  Signs and  symptoms of infection discussed, patient agrees to contact the office ASAP should they occur.  Dressing change recommended every other day.   Follow up plan: Return if symptoms worsen or fail to improve.

## 2019-03-10 NOTE — ED Notes (Addendum)
Patient incontinent of urine. Peri care provided Pure whick applied. Blue top drawn and sent. Daughter at bedside. Aware patient will be transfer to [facility] for Dx of frontal bleed. Patient confused , restless  in the bed. Unable to lay still reach for things in the air, answers questions but poor historian, follows commands and responds to name.

## 2019-03-10 NOTE — ED Notes (Signed)
Patient unable to sign emtela form due to change of mental status. No family at bedside.

## 2019-03-10 NOTE — ED Notes (Signed)
[facility] unable to pick patient up due to weather conditions. Awaiting call back from [facility] with [facility] ground transport.

## 2019-03-10 NOTE — ED Provider Notes (Signed)
[facility] Emergency Department Provider Note  ____________________________________________   First MD Initiated Contact with Patient 03/10/19 1724     (approximate)  I have reviewed the triage vital signs and the nursing notes.  History  Chief Complaint Altered Mental Status    HPI Joanna Rodriguez is a 68 y.o. female who presents emergency department for altered mental status.  According to family, patient has had a generalized decline over the last month or so in terms of her memory.  Today, however, she was acutely different than normal.  She did not answer her phone at all throughout the day which is abnormal for her.  When her son went to check on her, he found her sitting in a chair, altered (not answering questions normally), and dry heaving  On arrival, the patient is oriented to self and place, not to time.  This is abnormal per family member at bedside.  The patient herself denies any acute complaints, but does seem to be nauseous/dry heaving.  No known falls or trauma, however the patient lives alone.          Past Medical Hx Past Medical History:  Diagnosis Date  . Arthritis   . Cancer [facility])    face  . Complication of anesthesia    all anesthesia events in 2019 have worsened vertigo issues  . COPD (chronic obstructive pulmonary disease) ([place])   . Crohn's disease ([place])    1989 ?  Marland Kitchen Dyspnea on exertion   . Gastritis   . Genital herpes   . H/O pneumothorax    x 2  . History of kidney stones   . Hypertension   . Hypothyroid   . Intestinal metaplasia of gastric mucosa   . Kidney stones   . Osteopenia   . Personal history of tobacco use, presenting hazards to health 10/23/2015  . Polyp of sigmoid colon   . Scoliosis   . Spontaneous pneumothorax   . Trouble swallowing   . Vertigo       . Volvulus [facility])     Problem List Patient Active Problem List   Diagnosis Date Noted  . Superficial siderosis present on magnetic  resonance imaging ([place]) 02/21/2019  . Normal pressure hydrocephalus ([place]) 02/21/2019  . Recurrent falls 01/30/2019  . Barrett's esophagus with low grade dysplasia   . Occult blood in stools   . Iron deficiency anemia due to chronic blood loss 11/28/2017  . Adjustment disorder with depressed mood 10/28/2017  . Primary adenocarcinoma of ascending colon ([place]) 09/02/2017  . Personal history of colonic polyps   . Benign neoplasm of descending colon   . Benign neoplasm of transverse colon   . CAD (coronary artery disease) 10/27/2016  . Tobacco abuse, in remission 07/27/2016  . Stricture and stenosis of esophagus   . Reflux esophagitis   . Osteopenia 07/04/2015  . HTN (hypertension) 05/20/2015  . Thyroid activity decreased 05/20/2015  . B12 deficiency 03/06/2015  . CC (Crohn's colitis) ([place]) 03/06/2015  . Genital herpes 03/06/2015  . Spontaneous pneumothorax 09/17/2014  . COPD (chronic obstructive pulmonary disease) ([place]) 09/06/2014    Past Surgical Hx Past Surgical History:  Procedure Laterality Date  . ABDOMINAL HYSTERECTOMY    . APPENDECTOMY    . BREAST EXCISIONAL BIOPSY Bilateral 1990's   NEG  . CAPSULOTOMY METATARSOPHALANGEAL Left 03/19/2016   Procedure: CAPSULOTOMY METATARSOPHALANGEAL;  Surgeon: Albertine Patricia, DPM;  Location: [place];  Service: Podiatry;  Laterality: Left;  Second left toe.  Marland Kitchen  COLONOSCOPY WITH PROPOFOL N/A 08/05/2017   Procedure: COLONOSCOPY WITH PROPOFOL;  Surgeon: Lucilla Lame, MD;  Location: [place];  Service: Endoscopy;  Laterality: N/A;  . COLONOSCOPY WITH PROPOFOL N/A 01/06/2018   Procedure: COLONOSCOPY WITH PROPOFOL;  Surgeon: Lucilla Lame, MD;  Location: [place];  Service: Endoscopy;  Laterality: N/A;  . ESOPHAGOGASTRODUODENOSCOPY (EGD) WITH PROPOFOL N/A 11/21/2015   Procedure: ESOPHAGOGASTRODUODENOSCOPY (EGD) WITH PROPOFOL with dialation;  Surgeon: Lucilla Lame, MD;  Location: [place];  Service: Endoscopy;   Laterality: N/A;  . ESOPHAGOGASTRODUODENOSCOPY (EGD) WITH PROPOFOL N/A 01/06/2018   Procedure: ESOPHAGOGASTRODUODENOSCOPY (EGD) WITH biopsy.;  Surgeon: Lucilla Lame, MD;  Location: [place];  Service: Endoscopy;  Laterality: N/A;  . ESOPHAGOGASTRODUODENOSCOPY (EGD) WITH PROPOFOL N/A 03/03/2018   Procedure: ESOPHAGOGASTRODUODENOSCOPY (EGD) WITH PROPOFOL with biopsies;  Surgeon: Lucilla Lame, MD;  Location: [place];  Service: Endoscopy;  Laterality: N/A;  . FLEXOR TENDON REPAIR Left 03/19/2016   Procedure: FLEXOR TENDON release, percutaneous.;  Surgeon: Albertine Patricia, DPM;  Location: [place];  Service: Podiatry;  Laterality: Left;  4th left toe.  Marland Kitchen HAMMER TOE SURGERY Left 03/19/2016   Procedure: HAMMER TOE CORRECTION with PIPjoint fusion;  Surgeon: Albertine Patricia, DPM;  Location: [place];  Service: Podiatry;  Laterality: Left;  Third left toe.  Marland Kitchen HAMMER TOE SURGERY Right 08/06/2016   Procedure: HAMMER TOE CORRECTION  RIGHT 2ND AND 3RD;  Surgeon: Albertine Patricia, DPM;  Location: [place];  Service: Podiatry;  Laterality: Right;  LMA with local Special Needs:  [place]  [employer] Needs to be 2nd patient per office  . LAPAROSCOPIC RIGHT COLECTOMY Right 08/27/2017   Procedure: LAPAROSCOPIC RIGHT COLECTOMY;  Surgeon: Robert Bellow, MD;  Location: [facility];  Service: General;  Laterality: Right;  . POLYPECTOMY  08/05/2017   Procedure: POLYPECTOMY;  Surgeon: Lucilla Lame, MD;  Location: [place];  Service: Endoscopy;;  . POLYPECTOMY  01/06/2018   Procedure: POLYPECTOMY INTESTINAL;  Surgeon: Lucilla Lame, MD;  Location: [place];  Service: Endoscopy;;  . TALC PLEURODESIS  07/2014   left side  . THORACOSCOPY  07/2014   left side  . TUBAL LIGATION    . [place], with small bowel resection  . WEIL OSTEOTOMY Right 08/06/2016   Procedure: WEIL right 2nd & 3rd;  Surgeon: Albertine Patricia, DPM;  Location:  [place];  Service: Podiatry;  Laterality: Right;    Medications Prior to Admission medications   Medication Sig Start Date End Date Taking? Authorizing Provider  acyclovir (ZOVIRAX) 400 MG tablet Take 400 mg by mouth 5 (five) times daily. One a day    [provider]  albuterol (VENTOLIN HFA) 108 (90 Base) MCG/ACT inhaler Inhale 2 puffs into the lungs every 6 (six) hours as needed for wheezing or shortness of breath. 03/02/18   Wilhelmina Mcardle, MD  aspirin EC 81 MG tablet Take 81 mg by mouth daily.    [provider]  calcium carbonate (OSCAL) 1500 (600 Ca) MG TABS tablet Take by mouth 2 (two) times daily with a meal.    [provider]  Glucosamine-Chondroit-Vit C-Mn (GLUCOSAMINE-CHONDROITIN) TABS Take by mouth.    [provider]  levothyroxine (SYNTHROID, LEVOTHROID) 100 MCG tablet Take 1 tablet (100 mcg total) by mouth daily. 07/07/18   Johnson, Megan P, DO  lisinopril (ZESTRIL) 5 MG tablet Take 1 tablet (5 mg total) by mouth daily. 02/21/19   Valerie Roys, DO  Melatonin 5 MG TABS Take 10 mg by mouth at bedtime as needed (for sleep.).  08/03/14   [provider]  mercaptopurine (PURINETHOL) 50 MG tablet TAKE 1 AND 1/2 TABLETS EVERY DAY 09/07/18   Lucilla Lame, MD  Multiple Vitamins-Minerals (MULTIVITAMIN WITH MINERALS) tablet Take 1 tablet by mouth daily. One-A-Day for 50+ 03/10/14   [provider]  pantoprazole (PROTONIX) 40 MG tablet TAKE 1 TABLET BY MOUTH TWICE A DAY 08/16/18   Lucilla Lame, MD  Polyethyl Glycol-Propyl Glycol (SYSTANE) 0.4-0.3 % SOLN Apply to eye.    [provider]  tiZANidine (ZANAFLEX) 2 MG tablet Take by mouth. 05/25/18 05/25/19  [provider]  vitamin B-12 (CYANOCOBALAMIN) 1000 MCG tablet Take 1,000 mcg by mouth daily.    [provider]    Allergies Nsaids  Family Hx Family History  Problem Relation Age of Onset  . Hypertension Mother   . Hypothyroidism Mother   .  Alcohol abuse Mother   . Breast cancer Mother 84  . Hyperlipidemia Mother   . Mental illness Mother   . Heart attack Father   . Heart disease Father   . Glaucoma Father   . Colon cancer Father 59  . Diabetes Father   . Hypertension Father   . Heart disease Sister   . Hyperlipidemia Sister   . Hypertension Sister   . Lung disease Sister     Social Hx Social History   Tobacco Use  . Smoking status: Former Smoker    Packs/day: 1.50    Years: 53.00    Pack years: 79.50    Types: Cigarettes    Quit date: 08/22/2014    Years since quitting: 4.5  . Smokeless tobacco: Never Used  Substance Use Topics  . Alcohol use: Not Currently    Alcohol/week: 0.0 standard drinks    Comment: occasional - 2-3x a year   . Drug use: No     Review of Systems  Constitutional: Negative for fever. Negative for chills. Eyes: Negative for visual changes. ENT: Negative for sore throat. Cardiovascular: Negative for chest pain. Respiratory: Negative for shortness of breath. Gastrointestinal: Negative for abdominal pain. Negative for nausea. Negative for vomiting. Genitourinary: Negative for dysuria. Musculoskeletal: Negative for leg swelling. Skin: Negative for rash. Neurological: Negative for for headaches.  Patient does deny all of the above, though with a caveat of her encephalopathy.    Physical Exam  Vital Signs: ED Triage Vitals  Enc Vitals Group     BP 03/10/19 1722 (!) 164/70     Pulse Rate 03/10/19 1722 (!) 110     Resp 03/10/19 1722 20     Temp 03/10/19 1722 99.4 F (37.4 C)     Temp Source 03/10/19 1722 Oral     SpO2 03/10/19 1722 97 %     Weight 03/10/19 1725 160 lb (72.6 kg)     Height 03/10/19 1725 4' 11"  (1.499 m)     Head Circumference --      Peak Flow --      Pain Score --      Pain Loc --      Pain Edu? --      Excl. in [place]? --     Constitutional: Alert and oriented to person and place, not to time.  Eyes: Conjunctivae clear. Sclera anicteric. PERRL.  Head:  Normocephalic. Atraumatic. Nose: No congestion. No rhinorrhea. Mouth/Throat: Mucous membranes are moist.  Neck: No stridor.   Cardiovascular: Tachycardic, regular rhythm. No murmurs. Extremities well  perfused. Respiratory: Normal respiratory effort.  Lungs CTAB. Gastrointestinal: Soft and non-tender. No distention.  Musculoskeletal: No lower extremity edema. Neurologic:  Normal speech. Face symmetric. Needs repeated instruction for following commands. Strength grossly in tact. No lateralizing signs.  Skin: Skin is warm, dry and intact. No rash noted. Psychiatric: Mood and affect are appropriate for situation.  EKG  Personally reviewed.   Rate: 106 Rhythm: Sinus Axis: Normal Intervals: Within normal limits No acute ischemic changes, sinus tachycardia No STEMI    Radiology  CT head/CS: IMPRESSION: 1. Parenchymal hemorrhage centered in the right frontal lobe with associated subdural and subarachnoid blood and surrounding edema with 4 mm of right to left midline shift. 2. Associated intraventricular hemorrhage. Ventricular caliber and configuration is similar to prior studies, unlikely to reflect acute worsening of the mild hydrocephaly seen on comparison study. 3. Background of parenchymal volume loss and chronic white matter microangiopathy. 4. No acute cervical spine fracture or traumatic listhesis. 5. Multilevel cervical spondylosis resulting in at most mild multilevel foraminal stenoses but without severe foraminal or spinal canal stenosis.  XR chest: IMPRESSION: No active disease.   Procedures  Procedure(s) performed (including critical care):  .Critical Care Performed by: Lilia Pro., MD Authorized by: Lilia Pro., MD   Critical care provider statement:    Critical care time (minutes):  45   Critical care was necessary to treat or prevent imminent or life-threatening deterioration of the following conditions:  CNS failure or compromise   Critical  care was time spent personally by me on the following activities:  Discussions with consultants, evaluation of patient's response to treatment, examination of patient, ordering and performing treatments and interventions, ordering and review of laboratory studies, ordering and review of radiographic studies, pulse oximetry, re-evaluation of patient's condition, obtaining history from patient or surrogate and review of old charts     Initial Impression / Assessment and Plan / ED Course  68 y.o. female who presents to the ED for altered mental status.  Ddx: intracranial etiology, electrolyte abnormality, infection, amongst otherse  Plan: labs, urine, imaging  Work-up reveals right frontal parenchymal hemorrhage, with associated subdural and subarachnoid.  Patient is not on any anticoagulation or antiplatelets that require reversal.  We will keep goal blood pressure between systolic 001 and 749, with IV antihypertensives as needed.  Updated patient and family at bedside on results.  Discussed the need for transfer to another facility for Neurosurgery.  At this time, family requests [facility], as the patient has had prior care there for her Barrett's esophagus.  Discussed with [facility], who accepts the patient for transfer.    Final Clinical Impression(s) / ED Diagnosis  Final diagnoses:  Altered mental status, unspecified altered mental status type  Intraparenchymal hemorrhage of brain [facility])       Note:  This document was prepared using Dragon voice recognition software and may include unintentional dictation errors.   Lilia Pro., MD 03/10/19 2102

## 2019-03-10 NOTE — ED Triage Notes (Signed)
Frequent falls over past few months.  Family called and patient was not answering phone, when they went to check on her she was spacing out and not answering questions. Per EMS pt disoriented to year but is alert and responsive.  Has had fluid on brain and seen dr Manuella Ghazi but unsure of reason;  No shunt. Per daughter pt memory has been forgetful over past few months. Pt went for covid test at [facility] and ended up 50 miles from [employer].  When asked daughter unsure if fluid on brain is csf or blood.

## 2019-03-11 DIAGNOSIS — M6281 Muscle weakness (generalized): Secondary | ICD-10-CM | POA: Diagnosis not present

## 2019-03-11 DIAGNOSIS — N390 Urinary tract infection, site not specified: Secondary | ICD-10-CM | POA: Diagnosis not present

## 2019-03-11 DIAGNOSIS — G8194 Hemiplegia, unspecified affecting left nondominant side: Secondary | ICD-10-CM | POA: Diagnosis not present

## 2019-03-11 DIAGNOSIS — M0580 Other rheumatoid arthritis with rheumatoid factor of unspecified site: Secondary | ICD-10-CM | POA: Diagnosis not present

## 2019-03-11 DIAGNOSIS — G935 Compression of brain: Secondary | ICD-10-CM | POA: Diagnosis not present

## 2019-03-11 DIAGNOSIS — I348 Other nonrheumatic mitral valve disorders: Secondary | ICD-10-CM | POA: Diagnosis not present

## 2019-03-11 DIAGNOSIS — K5000 Crohn's disease of small intestine without complications: Secondary | ICD-10-CM | POA: Diagnosis not present

## 2019-03-11 DIAGNOSIS — R350 Frequency of micturition: Secondary | ICD-10-CM | POA: Diagnosis not present

## 2019-03-11 DIAGNOSIS — I609 Nontraumatic subarachnoid hemorrhage, unspecified: Secondary | ICD-10-CM | POA: Diagnosis not present

## 2019-03-11 DIAGNOSIS — R262 Difficulty in walking, not elsewhere classified: Secondary | ICD-10-CM | POA: Diagnosis not present

## 2019-03-11 DIAGNOSIS — I059 Rheumatic mitral valve disease, unspecified: Secondary | ICD-10-CM | POA: Diagnosis not present

## 2019-03-11 DIAGNOSIS — R279 Unspecified lack of coordination: Secondary | ICD-10-CM | POA: Diagnosis not present

## 2019-03-11 DIAGNOSIS — J449 Chronic obstructive pulmonary disease, unspecified: Secondary | ICD-10-CM | POA: Diagnosis not present

## 2019-03-11 DIAGNOSIS — Z85038 Personal history of other malignant neoplasm of large intestine: Secondary | ICD-10-CM | POA: Diagnosis not present

## 2019-03-11 DIAGNOSIS — K219 Gastro-esophageal reflux disease without esophagitis: Secondary | ICD-10-CM | POA: Diagnosis not present

## 2019-03-11 DIAGNOSIS — R52 Pain, unspecified: Secondary | ICD-10-CM | POA: Diagnosis not present

## 2019-03-11 DIAGNOSIS — R109 Unspecified abdominal pain: Secondary | ICD-10-CM | POA: Diagnosis not present

## 2019-03-11 DIAGNOSIS — K5090 Crohn's disease, unspecified, without complications: Secondary | ICD-10-CM | POA: Diagnosis not present

## 2019-03-11 DIAGNOSIS — I358 Other nonrheumatic aortic valve disorders: Secondary | ICD-10-CM | POA: Diagnosis not present

## 2019-03-11 DIAGNOSIS — Z8679 Personal history of other diseases of the circulatory system: Secondary | ICD-10-CM | POA: Diagnosis not present

## 2019-03-11 DIAGNOSIS — K5900 Constipation, unspecified: Secondary | ICD-10-CM | POA: Diagnosis not present

## 2019-03-11 DIAGNOSIS — R402413 Glasgow coma scale score 13-15, at hospital admission: Secondary | ICD-10-CM | POA: Diagnosis not present

## 2019-03-11 DIAGNOSIS — Z7982 Long term (current) use of aspirin: Secondary | ICD-10-CM | POA: Diagnosis not present

## 2019-03-11 DIAGNOSIS — G919 Hydrocephalus, unspecified: Secondary | ICD-10-CM | POA: Diagnosis not present

## 2019-03-11 DIAGNOSIS — I619 Nontraumatic intracerebral hemorrhage, unspecified: Secondary | ICD-10-CM | POA: Diagnosis not present

## 2019-03-11 DIAGNOSIS — I615 Nontraumatic intracerebral hemorrhage, intraventricular: Secondary | ICD-10-CM | POA: Diagnosis not present

## 2019-03-11 DIAGNOSIS — I69228 Other speech and language deficits following other nontraumatic intracranial hemorrhage: Secondary | ICD-10-CM | POA: Diagnosis not present

## 2019-03-11 DIAGNOSIS — I629 Nontraumatic intracranial hemorrhage, unspecified: Secondary | ICD-10-CM | POA: Diagnosis not present

## 2019-03-11 DIAGNOSIS — R531 Weakness: Secondary | ICD-10-CM | POA: Diagnosis not present

## 2019-03-11 DIAGNOSIS — E569 Vitamin deficiency, unspecified: Secondary | ICD-10-CM | POA: Diagnosis not present

## 2019-03-11 DIAGNOSIS — G9340 Encephalopathy, unspecified: Secondary | ICD-10-CM | POA: Diagnosis not present

## 2019-03-11 DIAGNOSIS — Z9181 History of falling: Secondary | ICD-10-CM | POA: Diagnosis not present

## 2019-03-11 DIAGNOSIS — I618 Other nontraumatic intracerebral hemorrhage: Secondary | ICD-10-CM | POA: Diagnosis not present

## 2019-03-11 DIAGNOSIS — I671 Cerebral aneurysm, nonruptured: Secondary | ICD-10-CM | POA: Diagnosis not present

## 2019-03-11 DIAGNOSIS — E854 Organ-limited amyloidosis: Secondary | ICD-10-CM | POA: Diagnosis not present

## 2019-03-11 DIAGNOSIS — I611 Nontraumatic intracerebral hemorrhage in hemisphere, cortical: Secondary | ICD-10-CM | POA: Diagnosis not present

## 2019-03-11 DIAGNOSIS — E039 Hypothyroidism, unspecified: Secondary | ICD-10-CM | POA: Diagnosis not present

## 2019-03-11 DIAGNOSIS — Z87891 Personal history of nicotine dependence: Secondary | ICD-10-CM | POA: Diagnosis not present

## 2019-03-11 DIAGNOSIS — R2681 Unsteadiness on feet: Secondary | ICD-10-CM | POA: Diagnosis not present

## 2019-03-11 DIAGNOSIS — I10 Essential (primary) hypertension: Secondary | ICD-10-CM | POA: Diagnosis not present

## 2019-03-11 DIAGNOSIS — R5381 Other malaise: Secondary | ICD-10-CM | POA: Diagnosis not present

## 2019-03-11 DIAGNOSIS — R000 Tachycardia, unspecified: Secondary | ICD-10-CM | POA: Diagnosis not present

## 2019-03-11 DIAGNOSIS — G936 Cerebral edema: Secondary | ICD-10-CM | POA: Diagnosis not present

## 2019-03-11 MED ORDER — SODIUM CHLORIDE 0.9 % IV SOLN
75.00 | INTRAVENOUS | Status: DC
Start: ? — End: 2019-03-11

## 2019-03-11 MED ORDER — HYDRALAZINE HCL 20 MG/ML IJ SOLN
10.00 | INTRAMUSCULAR | Status: DC
Start: ? — End: 2019-03-11

## 2019-03-11 MED ORDER — LABETALOL HCL 5 MG/ML IV SOLN
10.00 | INTRAVENOUS | Status: DC
Start: ? — End: 2019-03-11

## 2019-03-11 MED ORDER — NICOTINE 21 MG/24HR TD PT24
1.00 | MEDICATED_PATCH | TRANSDERMAL | Status: DC
Start: 2019-03-12 — End: 2019-03-11

## 2019-03-11 MED ORDER — FENTANYL CITRATE (PF) 50 MCG/ML IJ SOLN
25.00 | INTRAMUSCULAR | Status: DC
Start: ? — End: 2019-03-11

## 2019-03-11 MED ORDER — Medication
5.00 | Status: DC
Start: ? — End: 2019-03-11

## 2019-03-11 MED ORDER — LEVOTHYROXINE SODIUM 100 MCG PO TABS
100.00 | ORAL_TABLET | ORAL | Status: DC
Start: 2019-03-18 — End: 2019-03-11

## 2019-03-11 MED ORDER — LISINOPRIL 10 MG PO TABS
10.00 | ORAL_TABLET | ORAL | Status: DC
Start: 2019-03-12 — End: 2019-03-11

## 2019-03-11 MED ORDER — ACETAMINOPHEN 325 MG PO TABS
650.00 | ORAL_TABLET | ORAL | Status: DC
Start: ? — End: 2019-03-11

## 2019-03-11 MED ORDER — DEXTROSE 50 % IV SOLN
12.50 | INTRAVENOUS | Status: DC
Start: ? — End: 2019-03-11

## 2019-03-11 MED ORDER — ALBUTEROL SULFATE (2.5 MG/3ML) 0.083% IN NEBU
2.50 | INHALATION_SOLUTION | RESPIRATORY_TRACT | Status: DC
Start: ? — End: 2019-03-11

## 2019-03-11 MED ORDER — POLYETHYLENE GLYCOL 3350 17 G PO PACK
17.00 | PACK | ORAL | Status: DC
Start: 2019-03-12 — End: 2019-03-11

## 2019-03-13 ENCOUNTER — Telehealth: Payer: Self-pay | Admitting: Family Medicine

## 2019-03-13 NOTE — Telephone Encounter (Signed)
Noted  

## 2019-03-13 NOTE — Telephone Encounter (Signed)
Patient's daughter calling as FYI to let Dr. Wynetta Emery know that patient is currently in ICU at [place] for brain bleed.

## 2019-03-14 ENCOUNTER — Encounter: Payer: Self-pay | Admitting: Family Medicine

## 2019-03-17 DIAGNOSIS — N390 Urinary tract infection, site not specified: Secondary | ICD-10-CM | POA: Diagnosis not present

## 2019-03-17 DIAGNOSIS — Z1159 Encounter for screening for other viral diseases: Secondary | ICD-10-CM | POA: Diagnosis not present

## 2019-03-17 DIAGNOSIS — I69228 Other speech and language deficits following other nontraumatic intracranial hemorrhage: Secondary | ICD-10-CM | POA: Diagnosis not present

## 2019-03-17 DIAGNOSIS — R279 Unspecified lack of coordination: Secondary | ICD-10-CM | POA: Diagnosis not present

## 2019-03-17 DIAGNOSIS — K5000 Crohn's disease of small intestine without complications: Secondary | ICD-10-CM | POA: Diagnosis not present

## 2019-03-17 DIAGNOSIS — D519 Vitamin B12 deficiency anemia, unspecified: Secondary | ICD-10-CM | POA: Diagnosis not present

## 2019-03-17 DIAGNOSIS — I10 Essential (primary) hypertension: Secondary | ICD-10-CM | POA: Diagnosis not present

## 2019-03-17 DIAGNOSIS — R262 Difficulty in walking, not elsewhere classified: Secondary | ICD-10-CM | POA: Diagnosis not present

## 2019-03-17 DIAGNOSIS — M0580 Other rheumatoid arthritis with rheumatoid factor of unspecified site: Secondary | ICD-10-CM | POA: Diagnosis not present

## 2019-03-17 DIAGNOSIS — K219 Gastro-esophageal reflux disease without esophagitis: Secondary | ICD-10-CM | POA: Diagnosis not present

## 2019-03-17 DIAGNOSIS — D649 Anemia, unspecified: Secondary | ICD-10-CM | POA: Diagnosis not present

## 2019-03-17 DIAGNOSIS — I619 Nontraumatic intracerebral hemorrhage, unspecified: Secondary | ICD-10-CM | POA: Diagnosis not present

## 2019-03-17 DIAGNOSIS — E569 Vitamin deficiency, unspecified: Secondary | ICD-10-CM | POA: Diagnosis not present

## 2019-03-17 DIAGNOSIS — R52 Pain, unspecified: Secondary | ICD-10-CM | POA: Diagnosis not present

## 2019-03-17 DIAGNOSIS — R6889 Other general symptoms and signs: Secondary | ICD-10-CM | POA: Diagnosis not present

## 2019-03-17 DIAGNOSIS — I629 Nontraumatic intracranial hemorrhage, unspecified: Secondary | ICD-10-CM | POA: Diagnosis not present

## 2019-03-17 DIAGNOSIS — M6281 Muscle weakness (generalized): Secondary | ICD-10-CM | POA: Diagnosis not present

## 2019-03-17 DIAGNOSIS — R319 Hematuria, unspecified: Secondary | ICD-10-CM | POA: Diagnosis not present

## 2019-03-17 DIAGNOSIS — Z79899 Other long term (current) drug therapy: Secondary | ICD-10-CM | POA: Diagnosis not present

## 2019-03-17 DIAGNOSIS — I615 Nontraumatic intracerebral hemorrhage, intraventricular: Secondary | ICD-10-CM | POA: Diagnosis not present

## 2019-03-17 DIAGNOSIS — R296 Repeated falls: Secondary | ICD-10-CM | POA: Diagnosis not present

## 2019-03-17 DIAGNOSIS — E039 Hypothyroidism, unspecified: Secondary | ICD-10-CM | POA: Diagnosis not present

## 2019-03-17 DIAGNOSIS — R2681 Unsteadiness on feet: Secondary | ICD-10-CM | POA: Diagnosis not present

## 2019-03-17 DIAGNOSIS — R5381 Other malaise: Secondary | ICD-10-CM | POA: Diagnosis not present

## 2019-03-17 DIAGNOSIS — K5090 Crohn's disease, unspecified, without complications: Secondary | ICD-10-CM | POA: Diagnosis not present

## 2019-03-17 MED ORDER — ASPIRIN 81 MG PO CHEW
81.00 | CHEWABLE_TABLET | ORAL | Status: DC
Start: 2019-03-18 — End: 2019-03-17

## 2019-03-17 MED ORDER — AMLODIPINE BESYLATE 10 MG PO TABS
10.00 | ORAL_TABLET | ORAL | Status: DC
Start: 2019-03-18 — End: 2019-03-17

## 2019-03-17 MED ORDER — GENERIC EXTERNAL MEDICATION
100.00 | Status: DC
Start: 2019-03-18 — End: 2019-03-17

## 2019-03-17 MED ORDER — FOLIC ACID 1 MG PO TABS
1.00 | ORAL_TABLET | ORAL | Status: DC
Start: 2019-03-18 — End: 2019-03-17

## 2019-03-17 MED ORDER — ACETAMINOPHEN 325 MG PO TABS
650.00 | ORAL_TABLET | ORAL | Status: DC
Start: ? — End: 2019-03-17

## 2019-03-17 MED ORDER — POLYETHYLENE GLYCOL 3350 17 G PO PACK
17.00 | PACK | ORAL | Status: DC
Start: 2019-03-17 — End: 2019-03-17

## 2019-03-17 MED ORDER — ENOXAPARIN SODIUM 40 MG/0.4ML ~~LOC~~ SOLN
40.00 | SUBCUTANEOUS | Status: DC
Start: 2019-03-18 — End: 2019-03-17

## 2019-03-17 MED ORDER — GENERIC EXTERNAL MEDICATION
1.00 | Status: DC
Start: 2019-03-17 — End: 2019-03-17

## 2019-03-17 MED ORDER — GENERIC EXTERNAL MEDICATION
1.00 | Status: DC
Start: 2019-03-18 — End: 2019-03-17

## 2019-03-17 MED ORDER — LISINOPRIL 40 MG PO TABS
40.00 | ORAL_TABLET | ORAL | Status: DC
Start: 2019-03-18 — End: 2019-03-17

## 2019-03-17 MED ORDER — GENERIC EXTERNAL MEDICATION
75.00 | Status: DC
Start: 2019-03-18 — End: 2019-03-17

## 2019-03-17 MED ORDER — PANTOPRAZOLE SODIUM 40 MG PO TBEC
40.00 | DELAYED_RELEASE_TABLET | ORAL | Status: DC
Start: 2019-03-17 — End: 2019-03-17

## 2019-03-17 MED ORDER — ACYCLOVIR 200 MG PO CAPS
400.00 | ORAL_CAPSULE | ORAL | Status: DC
Start: 2019-03-18 — End: 2019-03-17

## 2019-03-17 MED ORDER — NITROFURANTOIN MONOHYD MACRO 100 MG PO CAPS
100.00 | ORAL_CAPSULE | ORAL | Status: DC
Start: 2019-03-17 — End: 2019-03-17

## 2019-03-18 ENCOUNTER — Ambulatory Visit: Payer: Medicare Other

## 2019-03-21 DIAGNOSIS — K5090 Crohn's disease, unspecified, without complications: Secondary | ICD-10-CM | POA: Diagnosis not present

## 2019-03-21 DIAGNOSIS — I619 Nontraumatic intracerebral hemorrhage, unspecified: Secondary | ICD-10-CM | POA: Diagnosis not present

## 2019-03-21 DIAGNOSIS — R296 Repeated falls: Secondary | ICD-10-CM | POA: Diagnosis not present

## 2019-03-21 DIAGNOSIS — I10 Essential (primary) hypertension: Secondary | ICD-10-CM | POA: Diagnosis not present

## 2019-03-21 DIAGNOSIS — M6281 Muscle weakness (generalized): Secondary | ICD-10-CM | POA: Diagnosis not present

## 2019-04-02 DIAGNOSIS — M6281 Muscle weakness (generalized): Secondary | ICD-10-CM | POA: Diagnosis not present

## 2019-04-02 DIAGNOSIS — R296 Repeated falls: Secondary | ICD-10-CM | POA: Diagnosis not present

## 2019-04-02 DIAGNOSIS — K5090 Crohn's disease, unspecified, without complications: Secondary | ICD-10-CM | POA: Diagnosis not present

## 2019-04-02 DIAGNOSIS — K219 Gastro-esophageal reflux disease without esophagitis: Secondary | ICD-10-CM | POA: Diagnosis not present

## 2019-04-02 DIAGNOSIS — I619 Nontraumatic intracerebral hemorrhage, unspecified: Secondary | ICD-10-CM | POA: Diagnosis not present

## 2019-04-18 DIAGNOSIS — M6281 Muscle weakness (generalized): Secondary | ICD-10-CM | POA: Diagnosis not present

## 2019-04-18 DIAGNOSIS — K219 Gastro-esophageal reflux disease without esophagitis: Secondary | ICD-10-CM | POA: Diagnosis not present

## 2019-04-18 DIAGNOSIS — I619 Nontraumatic intracerebral hemorrhage, unspecified: Secondary | ICD-10-CM | POA: Diagnosis not present

## 2019-04-18 DIAGNOSIS — K5090 Crohn's disease, unspecified, without complications: Secondary | ICD-10-CM | POA: Diagnosis not present

## 2019-04-18 DIAGNOSIS — R296 Repeated falls: Secondary | ICD-10-CM | POA: Diagnosis not present

## 2019-04-28 DIAGNOSIS — D649 Anemia, unspecified: Secondary | ICD-10-CM | POA: Diagnosis not present

## 2019-04-28 DIAGNOSIS — N390 Urinary tract infection, site not specified: Secondary | ICD-10-CM | POA: Diagnosis not present

## 2019-04-28 DIAGNOSIS — R319 Hematuria, unspecified: Secondary | ICD-10-CM | POA: Diagnosis not present

## 2019-04-28 DIAGNOSIS — I10 Essential (primary) hypertension: Secondary | ICD-10-CM | POA: Diagnosis not present

## 2019-05-03 ENCOUNTER — Other Ambulatory Visit: Payer: Self-pay | Admitting: Gastroenterology

## 2019-05-03 DIAGNOSIS — U071 COVID-19: Secondary | ICD-10-CM | POA: Diagnosis not present

## 2019-05-03 DIAGNOSIS — R262 Difficulty in walking, not elsewhere classified: Secondary | ICD-10-CM | POA: Diagnosis not present

## 2019-05-03 DIAGNOSIS — K5090 Crohn's disease, unspecified, without complications: Secondary | ICD-10-CM | POA: Diagnosis not present

## 2019-05-03 DIAGNOSIS — I69228 Other speech and language deficits following other nontraumatic intracranial hemorrhage: Secondary | ICD-10-CM | POA: Diagnosis not present

## 2019-05-03 DIAGNOSIS — N390 Urinary tract infection, site not specified: Secondary | ICD-10-CM | POA: Diagnosis not present

## 2019-05-03 DIAGNOSIS — M6281 Muscle weakness (generalized): Secondary | ICD-10-CM | POA: Diagnosis not present

## 2019-05-03 DIAGNOSIS — Z8619 Personal history of other infectious and parasitic diseases: Secondary | ICD-10-CM | POA: Diagnosis not present

## 2019-05-03 DIAGNOSIS — E569 Vitamin deficiency, unspecified: Secondary | ICD-10-CM | POA: Diagnosis not present

## 2019-05-03 DIAGNOSIS — R2681 Unsteadiness on feet: Secondary | ICD-10-CM | POA: Diagnosis not present

## 2019-05-03 DIAGNOSIS — M0580 Other rheumatoid arthritis with rheumatoid factor of unspecified site: Secondary | ICD-10-CM | POA: Diagnosis not present

## 2019-05-03 DIAGNOSIS — E039 Hypothyroidism, unspecified: Secondary | ICD-10-CM | POA: Diagnosis not present

## 2019-05-03 DIAGNOSIS — K5000 Crohn's disease of small intestine without complications: Secondary | ICD-10-CM | POA: Diagnosis not present

## 2019-05-03 DIAGNOSIS — I619 Nontraumatic intracerebral hemorrhage, unspecified: Secondary | ICD-10-CM | POA: Diagnosis not present

## 2019-05-03 DIAGNOSIS — I629 Nontraumatic intracranial hemorrhage, unspecified: Secondary | ICD-10-CM | POA: Diagnosis not present

## 2019-05-03 DIAGNOSIS — R52 Pain, unspecified: Secondary | ICD-10-CM | POA: Diagnosis not present

## 2019-05-03 DIAGNOSIS — I10 Essential (primary) hypertension: Secondary | ICD-10-CM | POA: Diagnosis not present

## 2019-05-03 DIAGNOSIS — K219 Gastro-esophageal reflux disease without esophagitis: Secondary | ICD-10-CM | POA: Diagnosis not present

## 2019-05-03 DIAGNOSIS — I720 Aneurysm of carotid artery: Secondary | ICD-10-CM | POA: Diagnosis not present

## 2019-05-04 DIAGNOSIS — D649 Anemia, unspecified: Secondary | ICD-10-CM | POA: Diagnosis not present

## 2019-05-04 DIAGNOSIS — R52 Pain, unspecified: Secondary | ICD-10-CM | POA: Diagnosis not present

## 2019-05-04 DIAGNOSIS — I10 Essential (primary) hypertension: Secondary | ICD-10-CM | POA: Diagnosis not present

## 2019-05-04 DIAGNOSIS — R262 Difficulty in walking, not elsewhere classified: Secondary | ICD-10-CM | POA: Diagnosis not present

## 2019-05-04 DIAGNOSIS — U071 COVID-19: Secondary | ICD-10-CM | POA: Diagnosis not present

## 2019-05-04 DIAGNOSIS — M0580 Other rheumatoid arthritis with rheumatoid factor of unspecified site: Secondary | ICD-10-CM | POA: Diagnosis not present

## 2019-05-04 DIAGNOSIS — E569 Vitamin deficiency, unspecified: Secondary | ICD-10-CM | POA: Diagnosis not present

## 2019-05-04 DIAGNOSIS — R2681 Unsteadiness on feet: Secondary | ICD-10-CM | POA: Diagnosis not present

## 2019-05-04 DIAGNOSIS — I69228 Other speech and language deficits following other nontraumatic intracranial hemorrhage: Secondary | ICD-10-CM | POA: Diagnosis not present

## 2019-05-04 DIAGNOSIS — K5000 Crohn's disease of small intestine without complications: Secondary | ICD-10-CM | POA: Diagnosis not present

## 2019-05-04 DIAGNOSIS — I629 Nontraumatic intracranial hemorrhage, unspecified: Secondary | ICD-10-CM | POA: Diagnosis not present

## 2019-05-04 DIAGNOSIS — E039 Hypothyroidism, unspecified: Secondary | ICD-10-CM | POA: Diagnosis not present

## 2019-05-04 DIAGNOSIS — R6889 Other general symptoms and signs: Secondary | ICD-10-CM | POA: Diagnosis not present

## 2019-05-04 DIAGNOSIS — K219 Gastro-esophageal reflux disease without esophagitis: Secondary | ICD-10-CM | POA: Diagnosis not present

## 2019-05-04 DIAGNOSIS — N390 Urinary tract infection, site not specified: Secondary | ICD-10-CM | POA: Diagnosis not present

## 2019-05-05 DIAGNOSIS — R262 Difficulty in walking, not elsewhere classified: Secondary | ICD-10-CM | POA: Diagnosis not present

## 2019-05-05 DIAGNOSIS — M0580 Other rheumatoid arthritis with rheumatoid factor of unspecified site: Secondary | ICD-10-CM | POA: Diagnosis not present

## 2019-05-05 DIAGNOSIS — K5000 Crohn's disease of small intestine without complications: Secondary | ICD-10-CM | POA: Diagnosis not present

## 2019-05-05 DIAGNOSIS — E569 Vitamin deficiency, unspecified: Secondary | ICD-10-CM | POA: Diagnosis not present

## 2019-05-05 DIAGNOSIS — I629 Nontraumatic intracranial hemorrhage, unspecified: Secondary | ICD-10-CM | POA: Diagnosis not present

## 2019-05-05 DIAGNOSIS — U071 COVID-19: Secondary | ICD-10-CM | POA: Diagnosis not present

## 2019-05-05 DIAGNOSIS — R2681 Unsteadiness on feet: Secondary | ICD-10-CM | POA: Diagnosis not present

## 2019-05-05 DIAGNOSIS — R52 Pain, unspecified: Secondary | ICD-10-CM | POA: Diagnosis not present

## 2019-05-05 DIAGNOSIS — N390 Urinary tract infection, site not specified: Secondary | ICD-10-CM | POA: Diagnosis not present

## 2019-05-05 DIAGNOSIS — E039 Hypothyroidism, unspecified: Secondary | ICD-10-CM | POA: Diagnosis not present

## 2019-05-05 DIAGNOSIS — I10 Essential (primary) hypertension: Secondary | ICD-10-CM | POA: Diagnosis not present

## 2019-05-05 DIAGNOSIS — I69228 Other speech and language deficits following other nontraumatic intracranial hemorrhage: Secondary | ICD-10-CM | POA: Diagnosis not present

## 2019-05-05 DIAGNOSIS — K219 Gastro-esophageal reflux disease without esophagitis: Secondary | ICD-10-CM | POA: Diagnosis not present

## 2019-05-08 DIAGNOSIS — U071 COVID-19: Secondary | ICD-10-CM | POA: Diagnosis not present

## 2019-05-08 DIAGNOSIS — I629 Nontraumatic intracranial hemorrhage, unspecified: Secondary | ICD-10-CM | POA: Diagnosis not present

## 2019-05-08 DIAGNOSIS — I69228 Other speech and language deficits following other nontraumatic intracranial hemorrhage: Secondary | ICD-10-CM | POA: Diagnosis not present

## 2019-05-08 DIAGNOSIS — I10 Essential (primary) hypertension: Secondary | ICD-10-CM | POA: Diagnosis not present

## 2019-05-08 DIAGNOSIS — E039 Hypothyroidism, unspecified: Secondary | ICD-10-CM | POA: Diagnosis not present

## 2019-05-08 DIAGNOSIS — R262 Difficulty in walking, not elsewhere classified: Secondary | ICD-10-CM | POA: Diagnosis not present

## 2019-05-08 DIAGNOSIS — K219 Gastro-esophageal reflux disease without esophagitis: Secondary | ICD-10-CM | POA: Diagnosis not present

## 2019-05-08 DIAGNOSIS — N390 Urinary tract infection, site not specified: Secondary | ICD-10-CM | POA: Diagnosis not present

## 2019-05-08 DIAGNOSIS — M0580 Other rheumatoid arthritis with rheumatoid factor of unspecified site: Secondary | ICD-10-CM | POA: Diagnosis not present

## 2019-05-08 DIAGNOSIS — R52 Pain, unspecified: Secondary | ICD-10-CM | POA: Diagnosis not present

## 2019-05-08 DIAGNOSIS — R2681 Unsteadiness on feet: Secondary | ICD-10-CM | POA: Diagnosis not present

## 2019-05-08 DIAGNOSIS — E569 Vitamin deficiency, unspecified: Secondary | ICD-10-CM | POA: Diagnosis not present

## 2019-05-08 DIAGNOSIS — K5000 Crohn's disease of small intestine without complications: Secondary | ICD-10-CM | POA: Diagnosis not present

## 2019-05-09 DIAGNOSIS — R2681 Unsteadiness on feet: Secondary | ICD-10-CM | POA: Diagnosis not present

## 2019-05-09 DIAGNOSIS — E569 Vitamin deficiency, unspecified: Secondary | ICD-10-CM | POA: Diagnosis not present

## 2019-05-09 DIAGNOSIS — I10 Essential (primary) hypertension: Secondary | ICD-10-CM | POA: Diagnosis not present

## 2019-05-09 DIAGNOSIS — R52 Pain, unspecified: Secondary | ICD-10-CM | POA: Diagnosis not present

## 2019-05-09 DIAGNOSIS — K5000 Crohn's disease of small intestine without complications: Secondary | ICD-10-CM | POA: Diagnosis not present

## 2019-05-09 DIAGNOSIS — R262 Difficulty in walking, not elsewhere classified: Secondary | ICD-10-CM | POA: Diagnosis not present

## 2019-05-09 DIAGNOSIS — I629 Nontraumatic intracranial hemorrhage, unspecified: Secondary | ICD-10-CM | POA: Diagnosis not present

## 2019-05-09 DIAGNOSIS — M0580 Other rheumatoid arthritis with rheumatoid factor of unspecified site: Secondary | ICD-10-CM | POA: Diagnosis not present

## 2019-05-09 DIAGNOSIS — K219 Gastro-esophageal reflux disease without esophagitis: Secondary | ICD-10-CM | POA: Diagnosis not present

## 2019-05-09 DIAGNOSIS — E039 Hypothyroidism, unspecified: Secondary | ICD-10-CM | POA: Diagnosis not present

## 2019-05-09 DIAGNOSIS — I69228 Other speech and language deficits following other nontraumatic intracranial hemorrhage: Secondary | ICD-10-CM | POA: Diagnosis not present

## 2019-05-09 DIAGNOSIS — U071 COVID-19: Secondary | ICD-10-CM | POA: Diagnosis not present

## 2019-05-09 DIAGNOSIS — N390 Urinary tract infection, site not specified: Secondary | ICD-10-CM | POA: Diagnosis not present

## 2019-05-10 DIAGNOSIS — K219 Gastro-esophageal reflux disease without esophagitis: Secondary | ICD-10-CM | POA: Diagnosis not present

## 2019-05-10 DIAGNOSIS — N390 Urinary tract infection, site not specified: Secondary | ICD-10-CM | POA: Diagnosis not present

## 2019-05-10 DIAGNOSIS — I629 Nontraumatic intracranial hemorrhage, unspecified: Secondary | ICD-10-CM | POA: Diagnosis not present

## 2019-05-10 DIAGNOSIS — R262 Difficulty in walking, not elsewhere classified: Secondary | ICD-10-CM | POA: Diagnosis not present

## 2019-05-10 DIAGNOSIS — U071 COVID-19: Secondary | ICD-10-CM | POA: Diagnosis not present

## 2019-05-10 DIAGNOSIS — I10 Essential (primary) hypertension: Secondary | ICD-10-CM | POA: Diagnosis not present

## 2019-05-10 DIAGNOSIS — E569 Vitamin deficiency, unspecified: Secondary | ICD-10-CM | POA: Diagnosis not present

## 2019-05-10 DIAGNOSIS — K5000 Crohn's disease of small intestine without complications: Secondary | ICD-10-CM | POA: Diagnosis not present

## 2019-05-10 DIAGNOSIS — I69228 Other speech and language deficits following other nontraumatic intracranial hemorrhage: Secondary | ICD-10-CM | POA: Diagnosis not present

## 2019-05-10 DIAGNOSIS — M0580 Other rheumatoid arthritis with rheumatoid factor of unspecified site: Secondary | ICD-10-CM | POA: Diagnosis not present

## 2019-05-10 DIAGNOSIS — R52 Pain, unspecified: Secondary | ICD-10-CM | POA: Diagnosis not present

## 2019-05-10 DIAGNOSIS — E039 Hypothyroidism, unspecified: Secondary | ICD-10-CM | POA: Diagnosis not present

## 2019-05-10 DIAGNOSIS — R2681 Unsteadiness on feet: Secondary | ICD-10-CM | POA: Diagnosis not present

## 2019-05-11 DIAGNOSIS — U071 COVID-19: Secondary | ICD-10-CM | POA: Diagnosis not present

## 2019-05-11 DIAGNOSIS — E569 Vitamin deficiency, unspecified: Secondary | ICD-10-CM | POA: Diagnosis not present

## 2019-05-11 DIAGNOSIS — R262 Difficulty in walking, not elsewhere classified: Secondary | ICD-10-CM | POA: Diagnosis not present

## 2019-05-11 DIAGNOSIS — K219 Gastro-esophageal reflux disease without esophagitis: Secondary | ICD-10-CM | POA: Diagnosis not present

## 2019-05-11 DIAGNOSIS — R52 Pain, unspecified: Secondary | ICD-10-CM | POA: Diagnosis not present

## 2019-05-11 DIAGNOSIS — I69228 Other speech and language deficits following other nontraumatic intracranial hemorrhage: Secondary | ICD-10-CM | POA: Diagnosis not present

## 2019-05-11 DIAGNOSIS — I10 Essential (primary) hypertension: Secondary | ICD-10-CM | POA: Diagnosis not present

## 2019-05-11 DIAGNOSIS — N390 Urinary tract infection, site not specified: Secondary | ICD-10-CM | POA: Diagnosis not present

## 2019-05-11 DIAGNOSIS — I629 Nontraumatic intracranial hemorrhage, unspecified: Secondary | ICD-10-CM | POA: Diagnosis not present

## 2019-05-11 DIAGNOSIS — M0580 Other rheumatoid arthritis with rheumatoid factor of unspecified site: Secondary | ICD-10-CM | POA: Diagnosis not present

## 2019-05-11 DIAGNOSIS — K5000 Crohn's disease of small intestine without complications: Secondary | ICD-10-CM | POA: Diagnosis not present

## 2019-05-11 DIAGNOSIS — R2681 Unsteadiness on feet: Secondary | ICD-10-CM | POA: Diagnosis not present

## 2019-05-11 DIAGNOSIS — E039 Hypothyroidism, unspecified: Secondary | ICD-10-CM | POA: Diagnosis not present

## 2019-05-12 DIAGNOSIS — R2681 Unsteadiness on feet: Secondary | ICD-10-CM | POA: Diagnosis not present

## 2019-05-12 DIAGNOSIS — K5000 Crohn's disease of small intestine without complications: Secondary | ICD-10-CM | POA: Diagnosis not present

## 2019-05-12 DIAGNOSIS — I69228 Other speech and language deficits following other nontraumatic intracranial hemorrhage: Secondary | ICD-10-CM | POA: Diagnosis not present

## 2019-05-12 DIAGNOSIS — N390 Urinary tract infection, site not specified: Secondary | ICD-10-CM | POA: Diagnosis not present

## 2019-05-12 DIAGNOSIS — R262 Difficulty in walking, not elsewhere classified: Secondary | ICD-10-CM | POA: Diagnosis not present

## 2019-05-12 DIAGNOSIS — U071 COVID-19: Secondary | ICD-10-CM | POA: Diagnosis not present

## 2019-05-12 DIAGNOSIS — E039 Hypothyroidism, unspecified: Secondary | ICD-10-CM | POA: Diagnosis not present

## 2019-05-12 DIAGNOSIS — M0580 Other rheumatoid arthritis with rheumatoid factor of unspecified site: Secondary | ICD-10-CM | POA: Diagnosis not present

## 2019-05-12 DIAGNOSIS — R52 Pain, unspecified: Secondary | ICD-10-CM | POA: Diagnosis not present

## 2019-05-12 DIAGNOSIS — K219 Gastro-esophageal reflux disease without esophagitis: Secondary | ICD-10-CM | POA: Diagnosis not present

## 2019-05-12 DIAGNOSIS — E569 Vitamin deficiency, unspecified: Secondary | ICD-10-CM | POA: Diagnosis not present

## 2019-05-12 DIAGNOSIS — I10 Essential (primary) hypertension: Secondary | ICD-10-CM | POA: Diagnosis not present

## 2019-05-12 DIAGNOSIS — I629 Nontraumatic intracranial hemorrhage, unspecified: Secondary | ICD-10-CM | POA: Diagnosis not present

## 2019-05-13 DIAGNOSIS — I10 Essential (primary) hypertension: Secondary | ICD-10-CM | POA: Diagnosis not present

## 2019-05-13 DIAGNOSIS — D649 Anemia, unspecified: Secondary | ICD-10-CM | POA: Diagnosis not present

## 2019-05-15 DIAGNOSIS — R52 Pain, unspecified: Secondary | ICD-10-CM | POA: Diagnosis not present

## 2019-05-15 DIAGNOSIS — E569 Vitamin deficiency, unspecified: Secondary | ICD-10-CM | POA: Diagnosis not present

## 2019-05-15 DIAGNOSIS — I69228 Other speech and language deficits following other nontraumatic intracranial hemorrhage: Secondary | ICD-10-CM | POA: Diagnosis not present

## 2019-05-15 DIAGNOSIS — M0580 Other rheumatoid arthritis with rheumatoid factor of unspecified site: Secondary | ICD-10-CM | POA: Diagnosis not present

## 2019-05-15 DIAGNOSIS — K5000 Crohn's disease of small intestine without complications: Secondary | ICD-10-CM | POA: Diagnosis not present

## 2019-05-15 DIAGNOSIS — E039 Hypothyroidism, unspecified: Secondary | ICD-10-CM | POA: Diagnosis not present

## 2019-05-15 DIAGNOSIS — I10 Essential (primary) hypertension: Secondary | ICD-10-CM | POA: Diagnosis not present

## 2019-05-15 DIAGNOSIS — I629 Nontraumatic intracranial hemorrhage, unspecified: Secondary | ICD-10-CM | POA: Diagnosis not present

## 2019-05-15 DIAGNOSIS — N390 Urinary tract infection, site not specified: Secondary | ICD-10-CM | POA: Diagnosis not present

## 2019-05-15 DIAGNOSIS — R2681 Unsteadiness on feet: Secondary | ICD-10-CM | POA: Diagnosis not present

## 2019-05-15 DIAGNOSIS — K219 Gastro-esophageal reflux disease without esophagitis: Secondary | ICD-10-CM | POA: Diagnosis not present

## 2019-05-15 DIAGNOSIS — R262 Difficulty in walking, not elsewhere classified: Secondary | ICD-10-CM | POA: Diagnosis not present

## 2019-05-15 DIAGNOSIS — U071 COVID-19: Secondary | ICD-10-CM | POA: Diagnosis not present

## 2019-05-16 DIAGNOSIS — E039 Hypothyroidism, unspecified: Secondary | ICD-10-CM | POA: Diagnosis not present

## 2019-05-16 DIAGNOSIS — K5000 Crohn's disease of small intestine without complications: Secondary | ICD-10-CM | POA: Diagnosis not present

## 2019-05-16 DIAGNOSIS — E569 Vitamin deficiency, unspecified: Secondary | ICD-10-CM | POA: Diagnosis not present

## 2019-05-16 DIAGNOSIS — I69228 Other speech and language deficits following other nontraumatic intracranial hemorrhage: Secondary | ICD-10-CM | POA: Diagnosis not present

## 2019-05-16 DIAGNOSIS — U071 COVID-19: Secondary | ICD-10-CM | POA: Diagnosis not present

## 2019-05-16 DIAGNOSIS — R2681 Unsteadiness on feet: Secondary | ICD-10-CM | POA: Diagnosis not present

## 2019-05-16 DIAGNOSIS — R262 Difficulty in walking, not elsewhere classified: Secondary | ICD-10-CM | POA: Diagnosis not present

## 2019-05-16 DIAGNOSIS — R6889 Other general symptoms and signs: Secondary | ICD-10-CM | POA: Diagnosis not present

## 2019-05-16 DIAGNOSIS — K219 Gastro-esophageal reflux disease without esophagitis: Secondary | ICD-10-CM | POA: Diagnosis not present

## 2019-05-16 DIAGNOSIS — R52 Pain, unspecified: Secondary | ICD-10-CM | POA: Diagnosis not present

## 2019-05-16 DIAGNOSIS — I629 Nontraumatic intracranial hemorrhage, unspecified: Secondary | ICD-10-CM | POA: Diagnosis not present

## 2019-05-16 DIAGNOSIS — M0580 Other rheumatoid arthritis with rheumatoid factor of unspecified site: Secondary | ICD-10-CM | POA: Diagnosis not present

## 2019-05-16 DIAGNOSIS — I10 Essential (primary) hypertension: Secondary | ICD-10-CM | POA: Diagnosis not present

## 2019-05-16 DIAGNOSIS — N390 Urinary tract infection, site not specified: Secondary | ICD-10-CM | POA: Diagnosis not present

## 2019-05-17 DIAGNOSIS — I629 Nontraumatic intracranial hemorrhage, unspecified: Secondary | ICD-10-CM | POA: Diagnosis not present

## 2019-05-17 DIAGNOSIS — U071 COVID-19: Secondary | ICD-10-CM | POA: Diagnosis not present

## 2019-05-17 DIAGNOSIS — R2681 Unsteadiness on feet: Secondary | ICD-10-CM | POA: Diagnosis not present

## 2019-05-17 DIAGNOSIS — K219 Gastro-esophageal reflux disease without esophagitis: Secondary | ICD-10-CM | POA: Diagnosis not present

## 2019-05-17 DIAGNOSIS — E569 Vitamin deficiency, unspecified: Secondary | ICD-10-CM | POA: Diagnosis not present

## 2019-05-17 DIAGNOSIS — R52 Pain, unspecified: Secondary | ICD-10-CM | POA: Diagnosis not present

## 2019-05-17 DIAGNOSIS — E039 Hypothyroidism, unspecified: Secondary | ICD-10-CM | POA: Diagnosis not present

## 2019-05-17 DIAGNOSIS — I69228 Other speech and language deficits following other nontraumatic intracranial hemorrhage: Secondary | ICD-10-CM | POA: Diagnosis not present

## 2019-05-17 DIAGNOSIS — N390 Urinary tract infection, site not specified: Secondary | ICD-10-CM | POA: Diagnosis not present

## 2019-05-17 DIAGNOSIS — R262 Difficulty in walking, not elsewhere classified: Secondary | ICD-10-CM | POA: Diagnosis not present

## 2019-05-17 DIAGNOSIS — I10 Essential (primary) hypertension: Secondary | ICD-10-CM | POA: Diagnosis not present

## 2019-05-17 DIAGNOSIS — M0580 Other rheumatoid arthritis with rheumatoid factor of unspecified site: Secondary | ICD-10-CM | POA: Diagnosis not present

## 2019-05-17 DIAGNOSIS — K5000 Crohn's disease of small intestine without complications: Secondary | ICD-10-CM | POA: Diagnosis not present

## 2019-05-18 DIAGNOSIS — I10 Essential (primary) hypertension: Secondary | ICD-10-CM | POA: Diagnosis not present

## 2019-05-18 DIAGNOSIS — R262 Difficulty in walking, not elsewhere classified: Secondary | ICD-10-CM | POA: Diagnosis not present

## 2019-05-18 DIAGNOSIS — I69228 Other speech and language deficits following other nontraumatic intracranial hemorrhage: Secondary | ICD-10-CM | POA: Diagnosis not present

## 2019-05-18 DIAGNOSIS — U071 COVID-19: Secondary | ICD-10-CM | POA: Diagnosis not present

## 2019-05-18 DIAGNOSIS — M0580 Other rheumatoid arthritis with rheumatoid factor of unspecified site: Secondary | ICD-10-CM | POA: Diagnosis not present

## 2019-05-18 DIAGNOSIS — K219 Gastro-esophageal reflux disease without esophagitis: Secondary | ICD-10-CM | POA: Diagnosis not present

## 2019-05-18 DIAGNOSIS — R2681 Unsteadiness on feet: Secondary | ICD-10-CM | POA: Diagnosis not present

## 2019-05-18 DIAGNOSIS — E569 Vitamin deficiency, unspecified: Secondary | ICD-10-CM | POA: Diagnosis not present

## 2019-05-18 DIAGNOSIS — R52 Pain, unspecified: Secondary | ICD-10-CM | POA: Diagnosis not present

## 2019-05-18 DIAGNOSIS — N390 Urinary tract infection, site not specified: Secondary | ICD-10-CM | POA: Diagnosis not present

## 2019-05-18 DIAGNOSIS — E039 Hypothyroidism, unspecified: Secondary | ICD-10-CM | POA: Diagnosis not present

## 2019-05-18 DIAGNOSIS — K5000 Crohn's disease of small intestine without complications: Secondary | ICD-10-CM | POA: Diagnosis not present

## 2019-05-18 DIAGNOSIS — I629 Nontraumatic intracranial hemorrhage, unspecified: Secondary | ICD-10-CM | POA: Diagnosis not present

## 2019-05-19 DIAGNOSIS — I629 Nontraumatic intracranial hemorrhage, unspecified: Secondary | ICD-10-CM | POA: Diagnosis not present

## 2019-05-19 DIAGNOSIS — E569 Vitamin deficiency, unspecified: Secondary | ICD-10-CM | POA: Diagnosis not present

## 2019-05-19 DIAGNOSIS — I69228 Other speech and language deficits following other nontraumatic intracranial hemorrhage: Secondary | ICD-10-CM | POA: Diagnosis not present

## 2019-05-19 DIAGNOSIS — R262 Difficulty in walking, not elsewhere classified: Secondary | ICD-10-CM | POA: Diagnosis not present

## 2019-05-19 DIAGNOSIS — E039 Hypothyroidism, unspecified: Secondary | ICD-10-CM | POA: Diagnosis not present

## 2019-05-19 DIAGNOSIS — U071 COVID-19: Secondary | ICD-10-CM | POA: Diagnosis not present

## 2019-05-19 DIAGNOSIS — M0580 Other rheumatoid arthritis with rheumatoid factor of unspecified site: Secondary | ICD-10-CM | POA: Diagnosis not present

## 2019-05-19 DIAGNOSIS — R2681 Unsteadiness on feet: Secondary | ICD-10-CM | POA: Diagnosis not present

## 2019-05-19 DIAGNOSIS — N390 Urinary tract infection, site not specified: Secondary | ICD-10-CM | POA: Diagnosis not present

## 2019-05-19 DIAGNOSIS — R52 Pain, unspecified: Secondary | ICD-10-CM | POA: Diagnosis not present

## 2019-05-19 DIAGNOSIS — R296 Repeated falls: Secondary | ICD-10-CM | POA: Diagnosis not present

## 2019-05-19 DIAGNOSIS — I729 Aneurysm of unspecified site: Secondary | ICD-10-CM | POA: Diagnosis not present

## 2019-05-19 DIAGNOSIS — K5000 Crohn's disease of small intestine without complications: Secondary | ICD-10-CM | POA: Diagnosis not present

## 2019-05-19 DIAGNOSIS — K219 Gastro-esophageal reflux disease without esophagitis: Secondary | ICD-10-CM | POA: Diagnosis not present

## 2019-05-19 DIAGNOSIS — I10 Essential (primary) hypertension: Secondary | ICD-10-CM | POA: Diagnosis not present

## 2019-05-19 DIAGNOSIS — I618 Other nontraumatic intracerebral hemorrhage: Secondary | ICD-10-CM | POA: Diagnosis not present

## 2019-05-22 ENCOUNTER — Other Ambulatory Visit: Payer: Self-pay | Admitting: Neurology

## 2019-05-22 DIAGNOSIS — E569 Vitamin deficiency, unspecified: Secondary | ICD-10-CM | POA: Diagnosis not present

## 2019-05-22 DIAGNOSIS — N390 Urinary tract infection, site not specified: Secondary | ICD-10-CM | POA: Diagnosis not present

## 2019-05-22 DIAGNOSIS — R2681 Unsteadiness on feet: Secondary | ICD-10-CM | POA: Diagnosis not present

## 2019-05-22 DIAGNOSIS — I629 Nontraumatic intracranial hemorrhage, unspecified: Secondary | ICD-10-CM | POA: Diagnosis not present

## 2019-05-22 DIAGNOSIS — M0580 Other rheumatoid arthritis with rheumatoid factor of unspecified site: Secondary | ICD-10-CM | POA: Diagnosis not present

## 2019-05-22 DIAGNOSIS — K5000 Crohn's disease of small intestine without complications: Secondary | ICD-10-CM | POA: Diagnosis not present

## 2019-05-22 DIAGNOSIS — R52 Pain, unspecified: Secondary | ICD-10-CM | POA: Diagnosis not present

## 2019-05-22 DIAGNOSIS — I10 Essential (primary) hypertension: Secondary | ICD-10-CM | POA: Diagnosis not present

## 2019-05-22 DIAGNOSIS — E039 Hypothyroidism, unspecified: Secondary | ICD-10-CM | POA: Diagnosis not present

## 2019-05-22 DIAGNOSIS — R262 Difficulty in walking, not elsewhere classified: Secondary | ICD-10-CM | POA: Diagnosis not present

## 2019-05-22 DIAGNOSIS — R296 Repeated falls: Secondary | ICD-10-CM

## 2019-05-22 DIAGNOSIS — I69228 Other speech and language deficits following other nontraumatic intracranial hemorrhage: Secondary | ICD-10-CM | POA: Diagnosis not present

## 2019-05-22 DIAGNOSIS — U071 COVID-19: Secondary | ICD-10-CM | POA: Diagnosis not present

## 2019-05-22 DIAGNOSIS — K219 Gastro-esophageal reflux disease without esophagitis: Secondary | ICD-10-CM | POA: Diagnosis not present

## 2019-05-23 DIAGNOSIS — R2681 Unsteadiness on feet: Secondary | ICD-10-CM | POA: Diagnosis not present

## 2019-05-23 DIAGNOSIS — J449 Chronic obstructive pulmonary disease, unspecified: Secondary | ICD-10-CM | POA: Diagnosis not present

## 2019-05-23 DIAGNOSIS — I629 Nontraumatic intracranial hemorrhage, unspecified: Secondary | ICD-10-CM | POA: Diagnosis not present

## 2019-05-23 DIAGNOSIS — E569 Vitamin deficiency, unspecified: Secondary | ICD-10-CM | POA: Diagnosis not present

## 2019-05-23 DIAGNOSIS — U071 COVID-19: Secondary | ICD-10-CM | POA: Diagnosis not present

## 2019-05-23 DIAGNOSIS — K5000 Crohn's disease of small intestine without complications: Secondary | ICD-10-CM | POA: Diagnosis not present

## 2019-05-23 DIAGNOSIS — R52 Pain, unspecified: Secondary | ICD-10-CM | POA: Diagnosis not present

## 2019-05-23 DIAGNOSIS — E039 Hypothyroidism, unspecified: Secondary | ICD-10-CM | POA: Diagnosis not present

## 2019-05-23 DIAGNOSIS — N390 Urinary tract infection, site not specified: Secondary | ICD-10-CM | POA: Diagnosis not present

## 2019-05-23 DIAGNOSIS — I10 Essential (primary) hypertension: Secondary | ICD-10-CM | POA: Diagnosis not present

## 2019-05-23 DIAGNOSIS — K219 Gastro-esophageal reflux disease without esophagitis: Secondary | ICD-10-CM | POA: Diagnosis not present

## 2019-05-23 DIAGNOSIS — M0580 Other rheumatoid arthritis with rheumatoid factor of unspecified site: Secondary | ICD-10-CM | POA: Diagnosis not present

## 2019-05-23 DIAGNOSIS — I69311 Memory deficit following cerebral infarction: Secondary | ICD-10-CM | POA: Diagnosis not present

## 2019-05-23 DIAGNOSIS — R262 Difficulty in walking, not elsewhere classified: Secondary | ICD-10-CM | POA: Diagnosis not present

## 2019-05-23 DIAGNOSIS — I69228 Other speech and language deficits following other nontraumatic intracranial hemorrhage: Secondary | ICD-10-CM | POA: Diagnosis not present

## 2019-05-24 DIAGNOSIS — I10 Essential (primary) hypertension: Secondary | ICD-10-CM | POA: Diagnosis not present

## 2019-05-24 DIAGNOSIS — U071 COVID-19: Secondary | ICD-10-CM | POA: Diagnosis not present

## 2019-05-24 DIAGNOSIS — I69228 Other speech and language deficits following other nontraumatic intracranial hemorrhage: Secondary | ICD-10-CM | POA: Diagnosis not present

## 2019-05-24 DIAGNOSIS — R2681 Unsteadiness on feet: Secondary | ICD-10-CM | POA: Diagnosis not present

## 2019-05-24 DIAGNOSIS — R52 Pain, unspecified: Secondary | ICD-10-CM | POA: Diagnosis not present

## 2019-05-24 DIAGNOSIS — N390 Urinary tract infection, site not specified: Secondary | ICD-10-CM | POA: Diagnosis not present

## 2019-05-24 DIAGNOSIS — E039 Hypothyroidism, unspecified: Secondary | ICD-10-CM | POA: Diagnosis not present

## 2019-05-24 DIAGNOSIS — I629 Nontraumatic intracranial hemorrhage, unspecified: Secondary | ICD-10-CM | POA: Diagnosis not present

## 2019-05-24 DIAGNOSIS — M0580 Other rheumatoid arthritis with rheumatoid factor of unspecified site: Secondary | ICD-10-CM | POA: Diagnosis not present

## 2019-05-24 DIAGNOSIS — E569 Vitamin deficiency, unspecified: Secondary | ICD-10-CM | POA: Diagnosis not present

## 2019-05-24 DIAGNOSIS — K5000 Crohn's disease of small intestine without complications: Secondary | ICD-10-CM | POA: Diagnosis not present

## 2019-05-24 DIAGNOSIS — K219 Gastro-esophageal reflux disease without esophagitis: Secondary | ICD-10-CM | POA: Diagnosis not present

## 2019-05-24 DIAGNOSIS — R262 Difficulty in walking, not elsewhere classified: Secondary | ICD-10-CM | POA: Diagnosis not present

## 2019-05-25 DIAGNOSIS — I69228 Other speech and language deficits following other nontraumatic intracranial hemorrhage: Secondary | ICD-10-CM | POA: Diagnosis not present

## 2019-05-25 DIAGNOSIS — R52 Pain, unspecified: Secondary | ICD-10-CM | POA: Diagnosis not present

## 2019-05-25 DIAGNOSIS — M0580 Other rheumatoid arthritis with rheumatoid factor of unspecified site: Secondary | ICD-10-CM | POA: Diagnosis not present

## 2019-05-25 DIAGNOSIS — R2681 Unsteadiness on feet: Secondary | ICD-10-CM | POA: Diagnosis not present

## 2019-05-25 DIAGNOSIS — I10 Essential (primary) hypertension: Secondary | ICD-10-CM | POA: Diagnosis not present

## 2019-05-25 DIAGNOSIS — E569 Vitamin deficiency, unspecified: Secondary | ICD-10-CM | POA: Diagnosis not present

## 2019-05-25 DIAGNOSIS — K5000 Crohn's disease of small intestine without complications: Secondary | ICD-10-CM | POA: Diagnosis not present

## 2019-05-25 DIAGNOSIS — R262 Difficulty in walking, not elsewhere classified: Secondary | ICD-10-CM | POA: Diagnosis not present

## 2019-05-25 DIAGNOSIS — U071 COVID-19: Secondary | ICD-10-CM | POA: Diagnosis not present

## 2019-05-25 DIAGNOSIS — K219 Gastro-esophageal reflux disease without esophagitis: Secondary | ICD-10-CM | POA: Diagnosis not present

## 2019-05-25 DIAGNOSIS — I629 Nontraumatic intracranial hemorrhage, unspecified: Secondary | ICD-10-CM | POA: Diagnosis not present

## 2019-05-25 DIAGNOSIS — N390 Urinary tract infection, site not specified: Secondary | ICD-10-CM | POA: Diagnosis not present

## 2019-05-25 DIAGNOSIS — E039 Hypothyroidism, unspecified: Secondary | ICD-10-CM | POA: Diagnosis not present

## 2019-05-26 DIAGNOSIS — R2681 Unsteadiness on feet: Secondary | ICD-10-CM | POA: Diagnosis not present

## 2019-05-26 DIAGNOSIS — M0580 Other rheumatoid arthritis with rheumatoid factor of unspecified site: Secondary | ICD-10-CM | POA: Diagnosis not present

## 2019-05-26 DIAGNOSIS — E569 Vitamin deficiency, unspecified: Secondary | ICD-10-CM | POA: Diagnosis not present

## 2019-05-26 DIAGNOSIS — I69228 Other speech and language deficits following other nontraumatic intracranial hemorrhage: Secondary | ICD-10-CM | POA: Diagnosis not present

## 2019-05-26 DIAGNOSIS — K5000 Crohn's disease of small intestine without complications: Secondary | ICD-10-CM | POA: Diagnosis not present

## 2019-05-26 DIAGNOSIS — U071 COVID-19: Secondary | ICD-10-CM | POA: Diagnosis not present

## 2019-05-26 DIAGNOSIS — R262 Difficulty in walking, not elsewhere classified: Secondary | ICD-10-CM | POA: Diagnosis not present

## 2019-05-26 DIAGNOSIS — R52 Pain, unspecified: Secondary | ICD-10-CM | POA: Diagnosis not present

## 2019-05-26 DIAGNOSIS — I629 Nontraumatic intracranial hemorrhage, unspecified: Secondary | ICD-10-CM | POA: Diagnosis not present

## 2019-05-26 DIAGNOSIS — E039 Hypothyroidism, unspecified: Secondary | ICD-10-CM | POA: Diagnosis not present

## 2019-05-26 DIAGNOSIS — I10 Essential (primary) hypertension: Secondary | ICD-10-CM | POA: Diagnosis not present

## 2019-05-26 DIAGNOSIS — K219 Gastro-esophageal reflux disease without esophagitis: Secondary | ICD-10-CM | POA: Diagnosis not present

## 2019-05-26 DIAGNOSIS — N390 Urinary tract infection, site not specified: Secondary | ICD-10-CM | POA: Diagnosis not present

## 2019-05-28 DIAGNOSIS — I619 Nontraumatic intracerebral hemorrhage, unspecified: Secondary | ICD-10-CM | POA: Diagnosis not present

## 2019-05-28 DIAGNOSIS — I10 Essential (primary) hypertension: Secondary | ICD-10-CM | POA: Diagnosis not present

## 2019-05-28 DIAGNOSIS — I720 Aneurysm of carotid artery: Secondary | ICD-10-CM | POA: Diagnosis not present

## 2019-05-28 DIAGNOSIS — K5090 Crohn's disease, unspecified, without complications: Secondary | ICD-10-CM | POA: Diagnosis not present

## 2019-05-28 DIAGNOSIS — E039 Hypothyroidism, unspecified: Secondary | ICD-10-CM | POA: Diagnosis not present

## 2019-05-29 DIAGNOSIS — I69228 Other speech and language deficits following other nontraumatic intracranial hemorrhage: Secondary | ICD-10-CM | POA: Diagnosis not present

## 2019-05-29 DIAGNOSIS — E569 Vitamin deficiency, unspecified: Secondary | ICD-10-CM | POA: Diagnosis not present

## 2019-05-29 DIAGNOSIS — M0580 Other rheumatoid arthritis with rheumatoid factor of unspecified site: Secondary | ICD-10-CM | POA: Diagnosis not present

## 2019-05-29 DIAGNOSIS — R52 Pain, unspecified: Secondary | ICD-10-CM | POA: Diagnosis not present

## 2019-05-29 DIAGNOSIS — R2681 Unsteadiness on feet: Secondary | ICD-10-CM | POA: Diagnosis not present

## 2019-05-29 DIAGNOSIS — R262 Difficulty in walking, not elsewhere classified: Secondary | ICD-10-CM | POA: Diagnosis not present

## 2019-05-29 DIAGNOSIS — I629 Nontraumatic intracranial hemorrhage, unspecified: Secondary | ICD-10-CM | POA: Diagnosis not present

## 2019-05-29 DIAGNOSIS — E039 Hypothyroidism, unspecified: Secondary | ICD-10-CM | POA: Diagnosis not present

## 2019-05-29 DIAGNOSIS — I10 Essential (primary) hypertension: Secondary | ICD-10-CM | POA: Diagnosis not present

## 2019-05-29 DIAGNOSIS — K5000 Crohn's disease of small intestine without complications: Secondary | ICD-10-CM | POA: Diagnosis not present

## 2019-05-29 DIAGNOSIS — K219 Gastro-esophageal reflux disease without esophagitis: Secondary | ICD-10-CM | POA: Diagnosis not present

## 2019-05-29 DIAGNOSIS — U071 COVID-19: Secondary | ICD-10-CM | POA: Diagnosis not present

## 2019-05-29 DIAGNOSIS — N390 Urinary tract infection, site not specified: Secondary | ICD-10-CM | POA: Diagnosis not present

## 2019-05-30 DIAGNOSIS — K219 Gastro-esophageal reflux disease without esophagitis: Secondary | ICD-10-CM | POA: Diagnosis not present

## 2019-05-30 DIAGNOSIS — E039 Hypothyroidism, unspecified: Secondary | ICD-10-CM | POA: Diagnosis not present

## 2019-05-30 DIAGNOSIS — R2681 Unsteadiness on feet: Secondary | ICD-10-CM | POA: Diagnosis not present

## 2019-05-30 DIAGNOSIS — J449 Chronic obstructive pulmonary disease, unspecified: Secondary | ICD-10-CM | POA: Diagnosis not present

## 2019-05-30 DIAGNOSIS — R52 Pain, unspecified: Secondary | ICD-10-CM | POA: Diagnosis not present

## 2019-05-30 DIAGNOSIS — I629 Nontraumatic intracranial hemorrhage, unspecified: Secondary | ICD-10-CM | POA: Diagnosis not present

## 2019-05-30 DIAGNOSIS — K5000 Crohn's disease of small intestine without complications: Secondary | ICD-10-CM | POA: Diagnosis not present

## 2019-05-30 DIAGNOSIS — R262 Difficulty in walking, not elsewhere classified: Secondary | ICD-10-CM | POA: Diagnosis not present

## 2019-05-30 DIAGNOSIS — U071 COVID-19: Secondary | ICD-10-CM | POA: Diagnosis not present

## 2019-05-30 DIAGNOSIS — I69311 Memory deficit following cerebral infarction: Secondary | ICD-10-CM | POA: Diagnosis not present

## 2019-05-30 DIAGNOSIS — I10 Essential (primary) hypertension: Secondary | ICD-10-CM | POA: Diagnosis not present

## 2019-05-30 DIAGNOSIS — I69228 Other speech and language deficits following other nontraumatic intracranial hemorrhage: Secondary | ICD-10-CM | POA: Diagnosis not present

## 2019-05-30 DIAGNOSIS — N390 Urinary tract infection, site not specified: Secondary | ICD-10-CM | POA: Diagnosis not present

## 2019-05-30 DIAGNOSIS — E569 Vitamin deficiency, unspecified: Secondary | ICD-10-CM | POA: Diagnosis not present

## 2019-05-30 DIAGNOSIS — M0580 Other rheumatoid arthritis with rheumatoid factor of unspecified site: Secondary | ICD-10-CM | POA: Diagnosis not present

## 2019-05-31 DIAGNOSIS — K219 Gastro-esophageal reflux disease without esophagitis: Secondary | ICD-10-CM | POA: Diagnosis not present

## 2019-05-31 DIAGNOSIS — R262 Difficulty in walking, not elsewhere classified: Secondary | ICD-10-CM | POA: Diagnosis not present

## 2019-05-31 DIAGNOSIS — N390 Urinary tract infection, site not specified: Secondary | ICD-10-CM | POA: Diagnosis not present

## 2019-05-31 DIAGNOSIS — R2681 Unsteadiness on feet: Secondary | ICD-10-CM | POA: Diagnosis not present

## 2019-05-31 DIAGNOSIS — E569 Vitamin deficiency, unspecified: Secondary | ICD-10-CM | POA: Diagnosis not present

## 2019-05-31 DIAGNOSIS — I69228 Other speech and language deficits following other nontraumatic intracranial hemorrhage: Secondary | ICD-10-CM | POA: Diagnosis not present

## 2019-05-31 DIAGNOSIS — U071 COVID-19: Secondary | ICD-10-CM | POA: Diagnosis not present

## 2019-05-31 DIAGNOSIS — K5000 Crohn's disease of small intestine without complications: Secondary | ICD-10-CM | POA: Diagnosis not present

## 2019-05-31 DIAGNOSIS — I10 Essential (primary) hypertension: Secondary | ICD-10-CM | POA: Diagnosis not present

## 2019-05-31 DIAGNOSIS — R52 Pain, unspecified: Secondary | ICD-10-CM | POA: Diagnosis not present

## 2019-05-31 DIAGNOSIS — M0580 Other rheumatoid arthritis with rheumatoid factor of unspecified site: Secondary | ICD-10-CM | POA: Diagnosis not present

## 2019-05-31 DIAGNOSIS — E039 Hypothyroidism, unspecified: Secondary | ICD-10-CM | POA: Diagnosis not present

## 2019-06-01 DIAGNOSIS — R2681 Unsteadiness on feet: Secondary | ICD-10-CM | POA: Diagnosis not present

## 2019-06-01 DIAGNOSIS — E569 Vitamin deficiency, unspecified: Secondary | ICD-10-CM | POA: Diagnosis not present

## 2019-06-01 DIAGNOSIS — K219 Gastro-esophageal reflux disease without esophagitis: Secondary | ICD-10-CM | POA: Diagnosis not present

## 2019-06-01 DIAGNOSIS — N390 Urinary tract infection, site not specified: Secondary | ICD-10-CM | POA: Diagnosis not present

## 2019-06-01 DIAGNOSIS — R262 Difficulty in walking, not elsewhere classified: Secondary | ICD-10-CM | POA: Diagnosis not present

## 2019-06-01 DIAGNOSIS — R52 Pain, unspecified: Secondary | ICD-10-CM | POA: Diagnosis not present

## 2019-06-01 DIAGNOSIS — K5000 Crohn's disease of small intestine without complications: Secondary | ICD-10-CM | POA: Diagnosis not present

## 2019-06-01 DIAGNOSIS — M0580 Other rheumatoid arthritis with rheumatoid factor of unspecified site: Secondary | ICD-10-CM | POA: Diagnosis not present

## 2019-06-01 DIAGNOSIS — I69228 Other speech and language deficits following other nontraumatic intracranial hemorrhage: Secondary | ICD-10-CM | POA: Diagnosis not present

## 2019-06-01 DIAGNOSIS — I10 Essential (primary) hypertension: Secondary | ICD-10-CM | POA: Diagnosis not present

## 2019-06-01 DIAGNOSIS — U071 COVID-19: Secondary | ICD-10-CM | POA: Diagnosis not present

## 2019-06-01 DIAGNOSIS — E039 Hypothyroidism, unspecified: Secondary | ICD-10-CM | POA: Diagnosis not present

## 2019-06-02 DIAGNOSIS — M0580 Other rheumatoid arthritis with rheumatoid factor of unspecified site: Secondary | ICD-10-CM | POA: Diagnosis not present

## 2019-06-02 DIAGNOSIS — E569 Vitamin deficiency, unspecified: Secondary | ICD-10-CM | POA: Diagnosis not present

## 2019-06-02 DIAGNOSIS — I10 Essential (primary) hypertension: Secondary | ICD-10-CM | POA: Diagnosis not present

## 2019-06-02 DIAGNOSIS — E039 Hypothyroidism, unspecified: Secondary | ICD-10-CM | POA: Diagnosis not present

## 2019-06-02 DIAGNOSIS — K5000 Crohn's disease of small intestine without complications: Secondary | ICD-10-CM | POA: Diagnosis not present

## 2019-06-02 DIAGNOSIS — R52 Pain, unspecified: Secondary | ICD-10-CM | POA: Diagnosis not present

## 2019-06-02 DIAGNOSIS — N390 Urinary tract infection, site not specified: Secondary | ICD-10-CM | POA: Diagnosis not present

## 2019-06-02 DIAGNOSIS — R262 Difficulty in walking, not elsewhere classified: Secondary | ICD-10-CM | POA: Diagnosis not present

## 2019-06-02 DIAGNOSIS — U071 COVID-19: Secondary | ICD-10-CM | POA: Diagnosis not present

## 2019-06-02 DIAGNOSIS — R2681 Unsteadiness on feet: Secondary | ICD-10-CM | POA: Diagnosis not present

## 2019-06-02 DIAGNOSIS — K219 Gastro-esophageal reflux disease without esophagitis: Secondary | ICD-10-CM | POA: Diagnosis not present

## 2019-06-02 DIAGNOSIS — I69228 Other speech and language deficits following other nontraumatic intracranial hemorrhage: Secondary | ICD-10-CM | POA: Diagnosis not present

## 2019-06-05 ENCOUNTER — Ambulatory Visit
Admission: RE | Admit: 2019-06-05 | Discharge: 2019-06-05 | Disposition: A | Discharge status: Home / Self Care | Payer: Medicare Other | Source: Ambulatory Visit | Attending: Neurology | Admitting: Neurology

## 2019-06-05 ENCOUNTER — Other Ambulatory Visit: Payer: Self-pay

## 2019-06-05 DIAGNOSIS — R296 Repeated falls: Secondary | ICD-10-CM | POA: Diagnosis not present

## 2019-06-05 DIAGNOSIS — M5124 Other intervertebral disc displacement, thoracic region: Secondary | ICD-10-CM | POA: Diagnosis not present

## 2019-06-05 DIAGNOSIS — I10 Essential (primary) hypertension: Secondary | ICD-10-CM | POA: Diagnosis not present

## 2019-06-05 DIAGNOSIS — K5000 Crohn's disease of small intestine without complications: Secondary | ICD-10-CM | POA: Diagnosis not present

## 2019-06-05 DIAGNOSIS — N390 Urinary tract infection, site not specified: Secondary | ICD-10-CM | POA: Diagnosis not present

## 2019-06-05 DIAGNOSIS — R262 Difficulty in walking, not elsewhere classified: Secondary | ICD-10-CM | POA: Diagnosis not present

## 2019-06-05 DIAGNOSIS — U071 COVID-19: Secondary | ICD-10-CM | POA: Diagnosis not present

## 2019-06-05 DIAGNOSIS — I69228 Other speech and language deficits following other nontraumatic intracranial hemorrhage: Secondary | ICD-10-CM | POA: Diagnosis not present

## 2019-06-05 DIAGNOSIS — R2681 Unsteadiness on feet: Secondary | ICD-10-CM | POA: Diagnosis not present

## 2019-06-05 DIAGNOSIS — E569 Vitamin deficiency, unspecified: Secondary | ICD-10-CM | POA: Diagnosis not present

## 2019-06-05 DIAGNOSIS — R52 Pain, unspecified: Secondary | ICD-10-CM | POA: Diagnosis not present

## 2019-06-05 DIAGNOSIS — E039 Hypothyroidism, unspecified: Secondary | ICD-10-CM | POA: Diagnosis not present

## 2019-06-05 DIAGNOSIS — M0580 Other rheumatoid arthritis with rheumatoid factor of unspecified site: Secondary | ICD-10-CM | POA: Diagnosis not present

## 2019-06-05 DIAGNOSIS — K219 Gastro-esophageal reflux disease without esophagitis: Secondary | ICD-10-CM | POA: Diagnosis not present

## 2019-06-05 LAB — POCT I-STAT CREATININE: Creatinine, Ser: 0.9 mg/dL (ref 0.44–1.00)

## 2019-06-05 MED ORDER — GADOBUTROL 1 MMOL/ML IV SOLN
7.0000 mL | Freq: Once | INTRAVENOUS | Status: AC | PRN
  Administered 2019-06-05: 7 mL via INTRAVENOUS

## 2019-06-06 DIAGNOSIS — I10 Essential (primary) hypertension: Secondary | ICD-10-CM | POA: Diagnosis not present

## 2019-06-06 DIAGNOSIS — R262 Difficulty in walking, not elsewhere classified: Secondary | ICD-10-CM | POA: Diagnosis not present

## 2019-06-06 DIAGNOSIS — K5000 Crohn's disease of small intestine without complications: Secondary | ICD-10-CM | POA: Diagnosis not present

## 2019-06-06 DIAGNOSIS — N390 Urinary tract infection, site not specified: Secondary | ICD-10-CM | POA: Diagnosis not present

## 2019-06-06 DIAGNOSIS — K219 Gastro-esophageal reflux disease without esophagitis: Secondary | ICD-10-CM | POA: Diagnosis not present

## 2019-06-06 DIAGNOSIS — R52 Pain, unspecified: Secondary | ICD-10-CM | POA: Diagnosis not present

## 2019-06-06 DIAGNOSIS — I69228 Other speech and language deficits following other nontraumatic intracranial hemorrhage: Secondary | ICD-10-CM | POA: Diagnosis not present

## 2019-06-06 DIAGNOSIS — M0580 Other rheumatoid arthritis with rheumatoid factor of unspecified site: Secondary | ICD-10-CM | POA: Diagnosis not present

## 2019-06-06 DIAGNOSIS — E039 Hypothyroidism, unspecified: Secondary | ICD-10-CM | POA: Diagnosis not present

## 2019-06-06 DIAGNOSIS — R2681 Unsteadiness on feet: Secondary | ICD-10-CM | POA: Diagnosis not present

## 2019-06-06 DIAGNOSIS — U071 COVID-19: Secondary | ICD-10-CM | POA: Diagnosis not present

## 2019-06-06 DIAGNOSIS — E569 Vitamin deficiency, unspecified: Secondary | ICD-10-CM | POA: Diagnosis not present

## 2019-06-07 DIAGNOSIS — I69228 Other speech and language deficits following other nontraumatic intracranial hemorrhage: Secondary | ICD-10-CM | POA: Diagnosis not present

## 2019-06-07 DIAGNOSIS — K219 Gastro-esophageal reflux disease without esophagitis: Secondary | ICD-10-CM | POA: Diagnosis not present

## 2019-06-07 DIAGNOSIS — E039 Hypothyroidism, unspecified: Secondary | ICD-10-CM | POA: Diagnosis not present

## 2019-06-07 DIAGNOSIS — K5000 Crohn's disease of small intestine without complications: Secondary | ICD-10-CM | POA: Diagnosis not present

## 2019-06-07 DIAGNOSIS — U071 COVID-19: Secondary | ICD-10-CM | POA: Diagnosis not present

## 2019-06-07 DIAGNOSIS — E569 Vitamin deficiency, unspecified: Secondary | ICD-10-CM | POA: Diagnosis not present

## 2019-06-07 DIAGNOSIS — M0580 Other rheumatoid arthritis with rheumatoid factor of unspecified site: Secondary | ICD-10-CM | POA: Diagnosis not present

## 2019-06-07 DIAGNOSIS — R52 Pain, unspecified: Secondary | ICD-10-CM | POA: Diagnosis not present

## 2019-06-07 DIAGNOSIS — N390 Urinary tract infection, site not specified: Secondary | ICD-10-CM | POA: Diagnosis not present

## 2019-06-07 DIAGNOSIS — I10 Essential (primary) hypertension: Secondary | ICD-10-CM | POA: Diagnosis not present

## 2019-06-07 DIAGNOSIS — R2681 Unsteadiness on feet: Secondary | ICD-10-CM | POA: Diagnosis not present

## 2019-06-07 DIAGNOSIS — R262 Difficulty in walking, not elsewhere classified: Secondary | ICD-10-CM | POA: Diagnosis not present

## 2019-06-08 DIAGNOSIS — R52 Pain, unspecified: Secondary | ICD-10-CM | POA: Diagnosis not present

## 2019-06-08 DIAGNOSIS — M0580 Other rheumatoid arthritis with rheumatoid factor of unspecified site: Secondary | ICD-10-CM | POA: Diagnosis not present

## 2019-06-08 DIAGNOSIS — U071 COVID-19: Secondary | ICD-10-CM | POA: Diagnosis not present

## 2019-06-08 DIAGNOSIS — K5000 Crohn's disease of small intestine without complications: Secondary | ICD-10-CM | POA: Diagnosis not present

## 2019-06-08 DIAGNOSIS — R262 Difficulty in walking, not elsewhere classified: Secondary | ICD-10-CM | POA: Diagnosis not present

## 2019-06-08 DIAGNOSIS — E039 Hypothyroidism, unspecified: Secondary | ICD-10-CM | POA: Diagnosis not present

## 2019-06-08 DIAGNOSIS — I10 Essential (primary) hypertension: Secondary | ICD-10-CM | POA: Diagnosis not present

## 2019-06-08 DIAGNOSIS — R2681 Unsteadiness on feet: Secondary | ICD-10-CM | POA: Diagnosis not present

## 2019-06-08 DIAGNOSIS — E569 Vitamin deficiency, unspecified: Secondary | ICD-10-CM | POA: Diagnosis not present

## 2019-06-08 DIAGNOSIS — I69228 Other speech and language deficits following other nontraumatic intracranial hemorrhage: Secondary | ICD-10-CM | POA: Diagnosis not present

## 2019-06-08 DIAGNOSIS — N390 Urinary tract infection, site not specified: Secondary | ICD-10-CM | POA: Diagnosis not present

## 2019-06-08 DIAGNOSIS — K219 Gastro-esophageal reflux disease without esophagitis: Secondary | ICD-10-CM | POA: Diagnosis not present

## 2019-06-09 DIAGNOSIS — I10 Essential (primary) hypertension: Secondary | ICD-10-CM | POA: Diagnosis not present

## 2019-06-09 DIAGNOSIS — M0580 Other rheumatoid arthritis with rheumatoid factor of unspecified site: Secondary | ICD-10-CM | POA: Diagnosis not present

## 2019-06-09 DIAGNOSIS — E039 Hypothyroidism, unspecified: Secondary | ICD-10-CM | POA: Diagnosis not present

## 2019-06-09 DIAGNOSIS — R52 Pain, unspecified: Secondary | ICD-10-CM | POA: Diagnosis not present

## 2019-06-09 DIAGNOSIS — R262 Difficulty in walking, not elsewhere classified: Secondary | ICD-10-CM | POA: Diagnosis not present

## 2019-06-09 DIAGNOSIS — R2681 Unsteadiness on feet: Secondary | ICD-10-CM | POA: Diagnosis not present

## 2019-06-09 DIAGNOSIS — N390 Urinary tract infection, site not specified: Secondary | ICD-10-CM | POA: Diagnosis not present

## 2019-06-09 DIAGNOSIS — U071 COVID-19: Secondary | ICD-10-CM | POA: Diagnosis not present

## 2019-06-09 DIAGNOSIS — K219 Gastro-esophageal reflux disease without esophagitis: Secondary | ICD-10-CM | POA: Diagnosis not present

## 2019-06-09 DIAGNOSIS — E569 Vitamin deficiency, unspecified: Secondary | ICD-10-CM | POA: Diagnosis not present

## 2019-06-09 DIAGNOSIS — K5000 Crohn's disease of small intestine without complications: Secondary | ICD-10-CM | POA: Diagnosis not present

## 2019-06-09 DIAGNOSIS — I69228 Other speech and language deficits following other nontraumatic intracranial hemorrhage: Secondary | ICD-10-CM | POA: Diagnosis not present

## 2019-06-13 DIAGNOSIS — I629 Nontraumatic intracranial hemorrhage, unspecified: Secondary | ICD-10-CM | POA: Diagnosis not present

## 2019-06-13 DIAGNOSIS — K5000 Crohn's disease of small intestine without complications: Secondary | ICD-10-CM | POA: Diagnosis not present

## 2019-06-13 DIAGNOSIS — J449 Chronic obstructive pulmonary disease, unspecified: Secondary | ICD-10-CM | POA: Diagnosis not present

## 2019-06-13 DIAGNOSIS — I69311 Memory deficit following cerebral infarction: Secondary | ICD-10-CM | POA: Diagnosis not present

## 2019-06-20 ENCOUNTER — Inpatient Hospital Stay: Payer: Medicare Other

## 2019-06-20 ENCOUNTER — Encounter: Payer: Self-pay | Admitting: Internal Medicine

## 2019-06-20 ENCOUNTER — Inpatient Hospital Stay: Payer: Medicare Other | Attending: Internal Medicine | Admitting: Internal Medicine

## 2019-06-20 ENCOUNTER — Other Ambulatory Visit: Payer: Self-pay

## 2019-06-20 VITALS — BP 99/67 | HR 83 | Temp 97.4°F

## 2019-06-20 DIAGNOSIS — M899 Disorder of bone, unspecified: Secondary | ICD-10-CM | POA: Insufficient documentation

## 2019-06-20 DIAGNOSIS — Z87891 Personal history of nicotine dependence: Secondary | ICD-10-CM | POA: Diagnosis not present

## 2019-06-20 DIAGNOSIS — Z86010 Personal history of colonic polyps: Secondary | ICD-10-CM | POA: Insufficient documentation

## 2019-06-20 DIAGNOSIS — C182 Malignant neoplasm of ascending colon: Secondary | ICD-10-CM | POA: Diagnosis not present

## 2019-06-20 DIAGNOSIS — G8929 Other chronic pain: Secondary | ICD-10-CM | POA: Insufficient documentation

## 2019-06-20 DIAGNOSIS — M545 Low back pain: Secondary | ICD-10-CM | POA: Insufficient documentation

## 2019-06-20 DIAGNOSIS — Z87442 Personal history of urinary calculi: Secondary | ICD-10-CM | POA: Insufficient documentation

## 2019-06-20 DIAGNOSIS — J449 Chronic obstructive pulmonary disease, unspecified: Secondary | ICD-10-CM | POA: Insufficient documentation

## 2019-06-20 DIAGNOSIS — Z79899 Other long term (current) drug therapy: Secondary | ICD-10-CM | POA: Insufficient documentation

## 2019-06-20 DIAGNOSIS — I10 Essential (primary) hypertension: Secondary | ICD-10-CM | POA: Insufficient documentation

## 2019-06-20 DIAGNOSIS — E039 Hypothyroidism, unspecified: Secondary | ICD-10-CM | POA: Insufficient documentation

## 2019-06-20 DIAGNOSIS — M2578 Osteophyte, vertebrae: Secondary | ICD-10-CM | POA: Insufficient documentation

## 2019-06-20 DIAGNOSIS — M8580 Other specified disorders of bone density and structure, unspecified site: Secondary | ICD-10-CM | POA: Diagnosis not present

## 2019-06-20 DIAGNOSIS — M419 Scoliosis, unspecified: Secondary | ICD-10-CM | POA: Diagnosis not present

## 2019-06-20 DIAGNOSIS — Z803 Family history of malignant neoplasm of breast: Secondary | ICD-10-CM | POA: Insufficient documentation

## 2019-06-20 DIAGNOSIS — K5090 Crohn's disease, unspecified, without complications: Secondary | ICD-10-CM | POA: Insufficient documentation

## 2019-06-20 DIAGNOSIS — Z8719 Personal history of other diseases of the digestive system: Secondary | ICD-10-CM | POA: Diagnosis not present

## 2019-06-20 DIAGNOSIS — Z7982 Long term (current) use of aspirin: Secondary | ICD-10-CM | POA: Diagnosis not present

## 2019-06-20 DIAGNOSIS — M4804 Spinal stenosis, thoracic region: Secondary | ICD-10-CM | POA: Insufficient documentation

## 2019-06-20 DIAGNOSIS — F0150 Vascular dementia without behavioral disturbance: Secondary | ICD-10-CM | POA: Insufficient documentation

## 2019-06-20 LAB — CBC WITH DIFFERENTIAL/PLATELET
Abs Immature Granulocytes: 0.02 10*3/uL (ref 0.00–0.07)
Basophils Absolute: 0.1 10*3/uL (ref 0.0–0.1)
Basophils Relative: 1 %
Eosinophils Absolute: 0.1 10*3/uL (ref 0.0–0.5)
Eosinophils Relative: 1 %
HCT: 42.6 % (ref 36.0–46.0)
Hemoglobin: 13.9 g/dL (ref 12.0–15.0)
Immature Granulocytes: 0 %
Lymphocytes Relative: 24 %
Lymphs Abs: 1.9 10*3/uL (ref 0.7–4.0)
MCH: 34 pg (ref 26.0–34.0)
MCHC: 32.6 g/dL (ref 30.0–36.0)
MCV: 104.2 fL — ABNORMAL HIGH (ref 80.0–100.0)
Monocytes Absolute: 0.5 10*3/uL (ref 0.1–1.0)
Monocytes Relative: 7 %
Neutro Abs: 5.2 10*3/uL (ref 1.7–7.7)
Neutrophils Relative %: 67 %
Platelets: 303 10*3/uL (ref 150–400)
RBC: 4.09 MIL/uL (ref 3.87–5.11)
RDW: 16.4 % — ABNORMAL HIGH (ref 11.5–15.5)
WBC: 7.9 10*3/uL (ref 4.0–10.5)
nRBC: 0 % (ref 0.0–0.2)

## 2019-06-20 LAB — COMPREHENSIVE METABOLIC PANEL
ALT: 22 U/L (ref 0–44)
AST: 26 U/L (ref 15–41)
Albumin: 4.1 g/dL (ref 3.5–5.0)
Alkaline Phosphatase: 66 U/L (ref 38–126)
Anion gap: 11 (ref 5–15)
BUN: 14 mg/dL (ref 8–23)
CO2: 25 mmol/L (ref 22–32)
Calcium: 9.8 mg/dL (ref 8.9–10.3)
Chloride: 103 mmol/L (ref 98–111)
Creatinine, Ser: 0.78 mg/dL (ref 0.44–1.00)
GFR calc Af Amer: >60 mL/min (ref >60)
GFR calc non Af Amer: >60 mL/min (ref >60)
Glucose, Bld: 119 mg/dL — ABNORMAL HIGH (ref 70–99)
Potassium: 4 mmol/L (ref 3.5–5.1)
Sodium: 139 mmol/L (ref 135–145)
Total Bilirubin: 0.7 mg/dL (ref 0.3–1.2)
Total Protein: 7.9 g/dL (ref 6.5–8.1)

## 2019-06-20 LAB — LACTATE DEHYDROGENASE: LDH: 163 U/L (ref 98–192)

## 2019-06-20 NOTE — Assessment & Plan Note (Signed)
#  C-7 vertebral body lesion enhancing 7-9 mm; incidental finding noted on imaging [chronic back pain].  Etiology unclear.  Discussed given the small size of the lesion-potentially artifactual versus metastatic disease-given history of colon cancer [however statistically less likely given stage I colon cancer].  Recommend checking CBC CMP CEA/multiple myeloma panel kappa lambda light chain.  #Colon cancer stage I-March 2019; statistically more than 90% chance of cure with surgery itself.  Unlikely a cause of above lesion.  However recommend a PET scan for further evaluation.  #Vascular dementia hydrocephalus followed by neurology;   #Chronic back pain-arthritis versus others.  Await above work-up.  Thank you, Ms.Konz for allowing me to participate in the care of your pleasant patient. Please do not hesitate to contact me with questions or concerns in the interim.  Discussed with the patient's daughter.  # DISPOSITION: # labs today # PET scan 1-2 weeks # follow up TBD- dr.B  Ml: Dr.Shah/Konz

## 2019-06-20 NOTE — Progress Notes (Signed)
[place] CONSULT NOTE  Patient Care Team: Valerie Roys, DO as PCP - General (Family Medicine) Lucilla Lame, MD as Consulting Physician (Gastroenterology) Wilhelmina Mcardle, MD as Consulting Physician (Pulmonary Disease)  CHIEF COMPLAINTS/PURPOSE OF CONSULTATION: Bone lesion  #  Oncology History Overview Note  # VASCULAR DEMENTIA [Dr.Shah]- Indeterminate 9 mm somewhat rounded focus of edema signal and enhancement within the anterosuperior C6 vertebral body. Contrast-enhanced MRI follow-up is recommended to ensure stability, particularly given the provided history of colon cancer.  #  MARCH 2019- COLON cancer [surgery ] pT2  pN0 - Stage I; MMR- STABLE.    Primary adenocarcinoma of ascending colon ([place])  09/02/2017 Initial Diagnosis   Primary adenocarcinoma of ascending colon ([place])      HISTORY OF PRESENTING ILLNESS:  Joanna Rodriguez 68 y.o.  female with a history of smoking; and also history of vascular dementia; history of stage I colon cancer currently at [facility] here given the recent abnormal findings noted on the MRI cervical spine.  Patient has chronic low back pain.  Patient also has dementia for which she is currently on Aricept.  Patient is in [facility].  Patient is ambulatory by herself.  Denies any new onset of headaches.  Denies any new onset of neck pain.   No weight loss.  No shortness of breath.  No chest pain.  Patient's last mammogram August 2019 normal.  Review of Systems  Constitutional: Positive for malaise/fatigue and weight loss. Negative for chills, diaphoresis and fever.  HENT: Negative for nosebleeds and sore throat.   Eyes: Negative for double vision.  Respiratory: Negative for cough, hemoptysis, sputum production, shortness of breath and wheezing.   Cardiovascular: Negative for chest pain, palpitations, orthopnea and leg swelling.  Gastrointestinal: Positive for abdominal pain. Negative for blood in stool,  constipation, diarrhea, heartburn, melena, nausea and vomiting.  Genitourinary: Negative for dysuria, frequency and urgency.  Musculoskeletal: Positive for back pain and joint pain.  Skin: Negative.  Negative for itching and rash.  Neurological: Positive for weakness. Negative for dizziness, tingling, focal weakness and headaches.  Endo/Heme/Allergies: Does not bruise/bleed easily.  Psychiatric/Behavioral: Negative for depression. The patient is not nervous/anxious and does not have insomnia.      MEDICAL HISTORY:  Past Medical History:  Diagnosis Date  . Arthritis   . Cancer [facility])    face  . Complication of anesthesia    all anesthesia events in 2019 have worsened vertigo issues  . COPD (chronic obstructive pulmonary disease) ([place])   . Crohn's disease ([place])    1989 ?  Marland Kitchen Dyspnea on exertion   . Gastritis   . Genital herpes   . H/O pneumothorax    x 2  . History of kidney stones   . Hypertension   . Hypothyroid   . Intestinal metaplasia of gastric mucosa   . Kidney stones   . Osteopenia   . Personal history of tobacco use, presenting hazards to health 10/23/2015  . Polyp of sigmoid colon   . Scoliosis   . Spontaneous pneumothorax   . Trouble swallowing   . Vertigo       . Volvulus ([place])     SURGICAL HISTORY: Past Surgical History:  Procedure Laterality Date  . ABDOMINAL HYSTERECTOMY    . APPENDECTOMY    . BREAST EXCISIONAL BIOPSY Bilateral 1990's   NEG  . CAPSULOTOMY METATARSOPHALANGEAL Left 03/19/2016   Procedure: CAPSULOTOMY METATARSOPHALANGEAL;  Surgeon: Albertine Patricia, DPM;  Location: [place];  Service:  Podiatry;  Laterality: Left;  Second left toe.  Marland Kitchen COLONOSCOPY WITH PROPOFOL N/A 08/05/2017   Procedure: COLONOSCOPY WITH PROPOFOL;  Surgeon: Lucilla Lame, MD;  Location: [place];  Service: Endoscopy;  Laterality: N/A;  . COLONOSCOPY WITH PROPOFOL N/A 01/06/2018   Procedure: COLONOSCOPY WITH PROPOFOL;  Surgeon: Lucilla Lame, MD;  Location:  [place];  Service: Endoscopy;  Laterality: N/A;  . ESOPHAGOGASTRODUODENOSCOPY (EGD) WITH PROPOFOL N/A 11/21/2015   Procedure: ESOPHAGOGASTRODUODENOSCOPY (EGD) WITH PROPOFOL with dialation;  Surgeon: Lucilla Lame, MD;  Location: [place];  Service: Endoscopy;  Laterality: N/A;  . ESOPHAGOGASTRODUODENOSCOPY (EGD) WITH PROPOFOL N/A 01/06/2018   Procedure: ESOPHAGOGASTRODUODENOSCOPY (EGD) WITH biopsy.;  Surgeon: Lucilla Lame, MD;  Location: [place];  Service: Endoscopy;  Laterality: N/A;  . ESOPHAGOGASTRODUODENOSCOPY (EGD) WITH PROPOFOL N/A 03/03/2018   Procedure: ESOPHAGOGASTRODUODENOSCOPY (EGD) WITH PROPOFOL with biopsies;  Surgeon: Lucilla Lame, MD;  Location: [place];  Service: Endoscopy;  Laterality: N/A;  . FLEXOR TENDON REPAIR Left 03/19/2016   Procedure: FLEXOR TENDON release, percutaneous.;  Surgeon: Albertine Patricia, DPM;  Location: [place];  Service: Podiatry;  Laterality: Left;  4th left toe.  Marland Kitchen HAMMER TOE SURGERY Left 03/19/2016   Procedure: HAMMER TOE CORRECTION with PIPjoint fusion;  Surgeon: Albertine Patricia, DPM;  Location: [place];  Service: Podiatry;  Laterality: Left;  Third left toe.  Marland Kitchen HAMMER TOE SURGERY Right 08/06/2016   Procedure: HAMMER TOE CORRECTION  RIGHT 2ND AND 3RD;  Surgeon: Albertine Patricia, DPM;  Location: [place];  Service: Podiatry;  Laterality: Right;  LMA with local Special Needs:  [place]  [employer] Needs to be 2nd patient per office  . LAPAROSCOPIC RIGHT COLECTOMY Right 08/27/2017   Procedure: LAPAROSCOPIC RIGHT COLECTOMY;  Surgeon: Robert Bellow, MD;  Location: [facility];  Service: General;  Laterality: Right;  . POLYPECTOMY  08/05/2017   Procedure: POLYPECTOMY;  Surgeon: Lucilla Lame, MD;  Location: [place];  Service: Endoscopy;;  . POLYPECTOMY  01/06/2018   Procedure: POLYPECTOMY INTESTINAL;  Surgeon: Lucilla Lame, MD;  Location: [place];  Service: Endoscopy;;   . TALC PLEURODESIS  07/2014   left side  . THORACOSCOPY  07/2014   left side  . TUBAL LIGATION    . [place], with small bowel resection  . WEIL OSTEOTOMY Right 08/06/2016   Procedure: WEIL right 2nd & 3rd;  Surgeon: Albertine Patricia, DPM;  Location: [place];  Service: Podiatry;  Laterality: Right;    SOCIAL HISTORY: Social History   Socioeconomic History  . Marital status: Divorced    Spouse name: Not on file  . Number of children: Not on file  . Years of education: GED, LPN, pharmacy tech   . Highest education level: Associate degree: occupational, Hotel manager, or vocational program  Occupational History  . Occupation: retired    Comment: Forensic scientist Rep - stocking\packing  Tobacco Use  . Smoking status: Former Smoker    Packs/day: 1.50    Years: 53.00    Pack years: 79.50    Types: Cigarettes    Quit date: 08/22/2014    Years since quitting: 4.8  . Smokeless tobacco: Never Used  Substance and Sexual Activity  . Alcohol use: Not Currently    Alcohol/week: 0.0 standard drinks    Comment: occasional - 2-3x a year   . Drug use: No  . Sexual activity: Never  Other Topics Concern  . Not on file  Social History Narrative  Currently at [facility].  Walks by herself.  History of smoking.  No alcohol.    Social Determinants of Health   Financial Resource Strain:   . Difficulty of Paying Living Expenses: Not on file  Food Insecurity:   . Worried About Charity fundraiser in the Last Year: Not on file  . Ran Out of Food in the Last Year: Not on file  Transportation Needs:   . Lack of Transportation (Medical): Not on file  . Lack of Transportation (Non-Medical): Not on file  Physical Activity: Insufficiently Active  . Days of Exercise per Week: 3 days  . Minutes of Exercise per Session: 30 min  Stress:   . Feeling of Stress : Not on file  Social Connections:   . Frequency of Communication with Friends and Family: Not on file  .  Frequency of Social Gatherings with Friends and Family: Not on file  . Attends Religious Services: Not on file  . Active Member of Clubs or Organizations: Not on file  . Attends Archivist Meetings: Not on file  . Marital Status: Not on file  Intimate Partner Violence:   . Fear of Current or Ex-Partner: Not on file  . Emotionally Abused: Not on file  . Physically Abused: Not on file  . Sexually Abused: Not on file    FAMILY HISTORY: Family History  Problem Relation Age of Onset  . Hypertension Mother   . Hypothyroidism Mother   . Alcohol abuse Mother   . Breast cancer Mother 42  . Hyperlipidemia Mother   . Mental illness Mother   . Heart attack Father   . Heart disease Father   . Glaucoma Father   . Colon cancer Father 45  . Diabetes Father   . Hypertension Father   . Heart disease Sister   . Hyperlipidemia Sister   . Hypertension Sister   . Lung disease Sister     ALLERGIES:  is allergic to nsaids.  MEDICATIONS:  Current Outpatient Medications  Medication Sig Dispense Refill  . acyclovir (ZOVIRAX) 400 MG tablet Take 400 mg by mouth 5 (five) times daily. One a day    . amLODipine (NORVASC) 10 MG tablet Take 1 tablet by mouth daily.    Marland Kitchen aspirin EC 81 MG tablet Take 81 mg by mouth daily.    . calcium carbonate (OSCAL) 1500 (600 Ca) MG TABS tablet Take by mouth 2 (two) times daily with a meal.    . donepezil (ARICEPT) 5 MG tablet Take 1 tablet by mouth daily.    . Glucosamine-Chondroit-Vit C-Mn (GLUCOSAMINE-CHONDROITIN) TABS Take by mouth.    . levothyroxine (SYNTHROID, LEVOTHROID) 100 MCG tablet Take 1 tablet (100 mcg total) by mouth daily. 90 tablet 3  . lisinopril (ZESTRIL) 5 MG tablet Take 1 tablet (5 mg total) by mouth daily. 90 tablet 1  . mercaptopurine (PURINETHOL) 50 MG tablet TAKE 1 AND 1/2 TABLETS EVERY DAY 135 tablet 3  . Multiple Vitamins-Minerals (MULTIVITAMIN WITH MINERALS) tablet Take 1 tablet by mouth daily. One-A-Day for 50+    . pantoprazole  (PROTONIX) 40 MG tablet TAKE 1 TABLET BY MOUTH TWICE A DAY 180 tablet 2  . Polyethyl Glycol-Propyl Glycol (SYSTANE) 0.4-0.3 % SOLN Apply to eye.    . vitamin B-12 (CYANOCOBALAMIN) 1000 MCG tablet Take 1,000 mcg by mouth daily.    Marland Kitchen albuterol (VENTOLIN HFA) 108 (90 Base) MCG/ACT inhaler Inhale 2 puffs into the lungs every 6 (six) hours as needed for wheezing or shortness  of breath. 18 g 11  . Melatonin 5 MG TABS Take 10 mg by mouth at bedtime as needed (for sleep.).      No current facility-administered medications for this visit.      Marland Kitchen  PHYSICAL EXAMINATION: Vitals:   06/20/19 1045  BP: 99/67  Pulse: 83  Temp: (!) 97.4 F (36.3 C)   There were no vitals filed for this visit.  Physical Exam  Constitutional: She is oriented to person, place, and time and well-developed, well-nourished, and in no distress.  In a wheelchair.  Accompanied by daughter.  HENT:  Head: Normocephalic and atraumatic.  Mouth/Throat: Oropharynx is clear and moist. No oropharyngeal exudate.  Eyes: Pupils are equal, round, and reactive to light.  Cardiovascular: Normal rate and regular rhythm.  Pulmonary/Chest: Effort normal and breath sounds normal. No respiratory distress. She has no wheezes.  Abdominal: Soft. Bowel sounds are normal. She exhibits no distension and no mass. There is no abdominal tenderness. There is no rebound and no guarding.  Musculoskeletal:        General: No tenderness or edema. Normal range of motion.     Cervical back: Normal range of motion and neck supple.  Neurological: She is alert and oriented to person, place, and time.  Skin: Skin is warm.  Psychiatric: Affect normal.     LABORATORY DATA:  I have reviewed the data as listed Lab Results  Component Value Date   WBC 7.9 06/20/2019   HGB 13.9 06/20/2019   HCT 42.6 06/20/2019   MCV 104.2 (H) 06/20/2019   PLT 303 06/20/2019   Recent Labs    01/30/19 1428 02/21/19 1550 06/05/19 1726 06/20/19 1133  NA 142 143  --   139  K 4.6 4.7  --  4.0  CL 101 101  --  103  CO2 23 26  --  25  GLUCOSE 88 93  --  119*  BUN 15 16  --  14  CREATININE 0.73 0.80 0.90 0.78  CALCIUM 9.5 10.4*  --  9.8  GFRNONAA 84 75  --  >60  GFRAA 96 86  --  >60  PROT 6.8 6.6  --  7.9  ALBUMIN 4.2 3.9  --  4.1  AST 35 34  --  26  ALT 49* 53*  --  22  ALKPHOS 64 58  --  66  BILITOT 0.3 0.3  --  0.7    RADIOGRAPHIC STUDIES: I have personally reviewed the radiological images as listed and agreed with the findings in the report. MR CERVICAL SPINE W WO CONTRAST  Result Date: 06/05/2019 CLINICAL DATA:  Falls frequently. Additional history provided: History of colon cancer. EXAM: MRI CERVICAL SPINE WITHOUT AND WITH CONTRAST TECHNIQUE: Multiplanar and multiecho pulse sequences of the cervical spine, to include the craniocervical junction and cervicothoracic junction, were obtained without and with intravenous contrast. CONTRAST:  40m GADAVIST GADOBUTROL 1 MMOL/ML IV SOLN COMPARISON:  CT cervical spine 03/10/2019 FINDINGS: The examination is significantly motion degraded, limiting evaluation. Alignment: Straightening of the expected cervical lordosis. Trace C3-C4 retrolisthesis. 2 mm C4-C5 grade 1 anterolisthesis. Vertebrae: Vertebral body height is maintained. There is an indeterminate 7 mm somewhat round focus of edema signal and enhancement within the anterior C6 vertebral body. Multilevel degenerative endplate irregularity. Mild multilevel degenerative endplate edema and enhancement. Cord: No spinal cord signal abnormality or abnormal cord enhancement identified on motion degraded imaging. Posterior Fossa, vertebral arteries, paraspinal tissues: No abnormality identified within included portions of the posterior fossa. Paraspinal  soft tissues within normal limits. Disc levels: Moderate disc degeneration at the C3-C4 through C6-C7 levels. Mild disc degeneration at the remaining levels. C2-C3: No disc herniation. No significant canal or foraminal  stenosis. C3-C4: Disc bulge. Uncovertebral and facet hypertrophy. Mild relative spinal canal and bilateral neural foraminal narrowing. C4-C5: Posterior disc osteophyte complex. Uncovertebral and facet hypertrophy. Mild spinal canal stenosis. Mild/moderate bilateral neural foraminal narrowing. C5-C6: Posterior disc osteophyte complex. Uncovertebral and facet hypertrophy (greater on the right). Mild spinal canal stenosis. Moderate right neural foraminal narrowing. C6-C7: Disc bulge. Uncovertebral hypertrophy. Mild effacement of the ventral thecal sac without significant central canal stenosis. Mild left neural foraminal narrowing. C7-T1: Small to moderate sized central disc protrusion with slight cephalad migration. Partial effacement of the ventral thecal sac and possible contact upon the ventral spinal cord. Mild relative spinal canal narrowing. No significant neural foraminal narrowing. IMPRESSION: Significantly motion degraded examination, limiting evaluation. Indeterminate 9 mm somewhat rounded focus of edema signal and enhancement within the anterosuperior C6 vertebral body. Contrast-enhanced MRI follow-up is recommended to ensure stability, particularly given the provided history of colon cancer. Cervical spondylosis as detailed. No more than mild spinal canal stenosis at any level. At C7-T1, there is a small to moderate-sized central disc protrusion with mild cephalad migration. Resultant effacement of the ventral thecal sac and possible contact upon the ventral spinal cord. Multilevel neural foraminal narrowing greatest bilaterally at C4-C5 (mild/moderate) and on the right at C5-C6 (moderate). Electronically Signed   By: Kellie Simmering DO   On: 06/05/2019 20:19   MR THORACIC SPINE W WO CONTRAST  Result Date: 06/05/2019 CLINICAL DATA:  Falls frequently. EXAM: MRI THORACIC WITHOUT AND WITH CONTRAST TECHNIQUE: Multiplanar and multiecho pulse sequences of the thoracic spine were obtained without and with  intravenous contrast. CONTRAST:  55m GADAVIST GADOBUTROL 1 MMOL/ML IV SOLN COMPARISON:  Chest CT 07/21/2018 FINDINGS: MRI THORACIC SPINE FINDINGS Alignment: Thoracic dextrocurvature. Slightly exaggerated thoracic kyphosis. Trace T1-T2 and T2-T3 grade 1 anterolisthesis. Mild T11-T12 and T12-L1 grade 1 retrolisthesis. Vertebrae: Vertebral body height is maintained. Mild multilevel degenerative endplate irregularity with small Schmorl nodes. Mild multilevel degenerative endplate edema and enhancement, greatest at T11-T12. Cord: No spinal cord signal abnormality. No abnormal cord enhancement. Paraspinal and other soft tissues: No abnormality identified within included portions of the thorax/upper abdomen. Paraspinal soft tissues within normal limits. Disc levels: Moderate multilevel disc degeneration. Multilevel facet hypertrophy, greatest within the lower thoracic spine. Multilevel disc bulges. Superimposed disc protrusions at several levels, as follows. Small T5-T6 right center disc protrusion which mildly effaces the ventral thecal sac, without significant central canal stenosis. Small to moderate T6-T7 right central disc protrusion which partially effaces the right ventral thecal sac, contacting and mildly flattening the right ventral spinal cord, although without significant central canal stenosis. Small T7-T8 right center disc protrusion partially effacing the right ventral thecal sac and contacting the ventral spinal cord, although without significant central canal stenosis. Small T8-T9 central disc protrusion mildly effacing the ventral thecal sac, without significant central canal stenosis. Small T9-T10 left center disc protrusion mildly effacing the left ventral thecal sac, without significant central canal stenosis. At T11-T12, disc bulge asymmetric to the left with superimposed broad-based left center/foraminal disc protrusion. Mild effacement of the left anterior thecal sac without significant central canal  stenosis. Mild left neural foraminal narrowing. Portions of the upper lumbar spine, included on sagittal imaging only. Multilevel disc bulges at the visualized upper lumbar levels with no more than mild spinal canal stenosis. IMPRESSION: No compression  deformity. Thoracic spondylosis as detailed. This includes multiple small and small to moderate-sized disc protrusions which partially efface the ventral thecal sac, some contacting and mildly flattening the ventral spinal cord. However, no significant central canal stenosis at any level. Multifactorial mild left neural foraminal narrowing at T11-T12. Electronically Signed   By: Kellie Simmering DO   On: 06/05/2019 20:03    ASSESSMENT & PLAN:   Bone lesion # C-7 vertebral body lesion enhancing 7-9 mm; incidental finding noted on imaging [chronic back pain].  Etiology unclear.  Discussed given the small size of the lesion-potentially artifactual versus metastatic disease-given history of colon cancer [however statistically less likely given stage I colon cancer].  Recommend checking CBC CMP CEA/multiple myeloma panel kappa lambda light chain.  #Colon cancer stage I-March 2019; statistically more than 90% chance of cure with surgery itself.  Unlikely a cause of above lesion.  However recommend a PET scan for further evaluation.  #Vascular dementia hydrocephalus followed by neurology;   #Chronic back pain-arthritis versus others.  Await above work-up.  Thank you, Ms.Konz for allowing me to participate in the care of your pleasant patient. Please do not hesitate to contact me with questions or concerns in the interim.  Discussed with the patient's daughter.  # DISPOSITION: # labs today # PET scan 1-2 weeks # follow up TBD- dr.B  Ml: Dr.Shah/Konz  All questions were answered. The patient knows to call the clinic with any problems, questions or concerns.    Cammie Sickle, MD 06/20/2019 12:24 PM

## 2019-06-21 LAB — KAPPA/LAMBDA LIGHT CHAINS
Kappa free light chain: 22 mg/L — ABNORMAL HIGH (ref 3.3–19.4)
Kappa, lambda light chain ratio: 1.41 (ref 0.26–1.65)
Lambda free light chains: 15.6 mg/L (ref 5.7–26.3)

## 2019-06-21 LAB — MULTIPLE MYELOMA PANEL, SERUM
Albumin SerPl Elph-Mcnc: 3.5 g/dL (ref 2.9–4.4)
Albumin/Glob SerPl: 1.1 (ref 0.7–1.7)
Alpha 1: 0.4 g/dL (ref 0.0–0.4)
Alpha2 Glob SerPl Elph-Mcnc: 0.9 g/dL (ref 0.4–1.0)
B-Globulin SerPl Elph-Mcnc: 1.3 g/dL (ref 0.7–1.3)
Gamma Glob SerPl Elph-Mcnc: 0.9 g/dL (ref 0.4–1.8)
Globulin, Total: 3.4 g/dL (ref 2.2–3.9)
IgA: 418 mg/dL — ABNORMAL HIGH (ref 87–352)
IgG (Immunoglobin G), Serum: 971 mg/dL (ref 586–1602)
IgM (Immunoglobulin M), Srm: 81 mg/dL (ref 26–217)
Total Protein ELP: 6.9 g/dL (ref 6.0–8.5)

## 2019-06-21 LAB — CEA: CEA: 38.8 ng/mL — ABNORMAL HIGH (ref 0.0–4.7)

## 2019-06-22 DIAGNOSIS — I619 Nontraumatic intracerebral hemorrhage, unspecified: Secondary | ICD-10-CM | POA: Diagnosis not present

## 2019-06-22 DIAGNOSIS — K5090 Crohn's disease, unspecified, without complications: Secondary | ICD-10-CM | POA: Diagnosis not present

## 2019-06-22 DIAGNOSIS — I10 Essential (primary) hypertension: Secondary | ICD-10-CM | POA: Diagnosis not present

## 2019-06-22 DIAGNOSIS — I720 Aneurysm of carotid artery: Secondary | ICD-10-CM | POA: Diagnosis not present

## 2019-06-22 DIAGNOSIS — E039 Hypothyroidism, unspecified: Secondary | ICD-10-CM | POA: Diagnosis not present

## 2019-06-27 ENCOUNTER — Telehealth: Payer: Self-pay | Admitting: Family Medicine

## 2019-06-27 DIAGNOSIS — K5000 Crohn's disease of small intestine without complications: Secondary | ICD-10-CM | POA: Diagnosis not present

## 2019-06-27 DIAGNOSIS — J449 Chronic obstructive pulmonary disease, unspecified: Secondary | ICD-10-CM | POA: Diagnosis not present

## 2019-06-27 DIAGNOSIS — I69311 Memory deficit following cerebral infarction: Secondary | ICD-10-CM | POA: Diagnosis not present

## 2019-06-27 DIAGNOSIS — I629 Nontraumatic intracranial hemorrhage, unspecified: Secondary | ICD-10-CM | POA: Diagnosis not present

## 2019-06-27 NOTE — Chronic Care Management (AMB) (Signed)
Chronic Care Management   Note  06/27/2019 Name: Joanna Rodriguez MRN: [id number] DOB: 10-Mar-1951  Joanna Rodriguez is a 68 y.o. year old female who is a primary care patient of Valerie Roys, DO. I reached out to Joanna Rodriguez by phone today in response to a referral sent by Joanna Rodriguez's health plan.     Joanna Rodriguez was given information about Chronic Care Management services today including:  1. CCM service includes personalized support from designated clinical staff supervised by her physician, including individualized plan of care and coordination with other care providers 2. 24/7 contact phone numbers for assistance for urgent and routine care needs. 3. Service will only be billed when office clinical staff spend 20 minutes or more in a month to coordinate care. 4. Only one practitioner may furnish and bill the service in a calendar month. 5. The patient may stop CCM services at any time (effective at the end of the month) by phone call to the office staff. 6. The patient will be responsible for cost sharing (co-pay) of up to 20% of the service fee (after annual deductible is met).  Patients daughter Joanna Rodriguez did not agree to enrollment in care management services and does not wish to consider at this time.  Follow up plan: No further follow needed.   [place], [place] [address] Direct Dial: [email or web address]  Website: [email or web address]

## 2019-06-28 ENCOUNTER — Other Ambulatory Visit: Payer: Self-pay | Admitting: Neurology

## 2019-06-28 DIAGNOSIS — M5090 Cervical disc disorder, unspecified, unspecified cervical region: Secondary | ICD-10-CM

## 2019-06-28 DIAGNOSIS — Z79899 Other long term (current) drug therapy: Secondary | ICD-10-CM | POA: Diagnosis not present

## 2019-06-28 DIAGNOSIS — E785 Hyperlipidemia, unspecified: Secondary | ICD-10-CM | POA: Diagnosis not present

## 2019-06-28 DIAGNOSIS — I10 Essential (primary) hypertension: Secondary | ICD-10-CM | POA: Diagnosis not present

## 2019-06-28 DIAGNOSIS — E039 Hypothyroidism, unspecified: Secondary | ICD-10-CM | POA: Diagnosis not present

## 2019-06-28 DIAGNOSIS — R6889 Other general symptoms and signs: Secondary | ICD-10-CM | POA: Diagnosis not present

## 2019-06-28 DIAGNOSIS — D649 Anemia, unspecified: Secondary | ICD-10-CM | POA: Diagnosis not present

## 2019-06-28 DIAGNOSIS — D519 Vitamin B12 deficiency anemia, unspecified: Secondary | ICD-10-CM | POA: Diagnosis not present

## 2019-07-03 ENCOUNTER — Other Ambulatory Visit: Payer: Self-pay

## 2019-07-03 ENCOUNTER — Encounter
Admission: RE | Admit: 2019-07-03 | Discharge: 2019-07-03 | Disposition: A | Discharge status: Home / Self Care | Payer: Medicare Other | Source: Ambulatory Visit | Attending: Internal Medicine | Admitting: Internal Medicine

## 2019-07-03 ENCOUNTER — Other Ambulatory Visit: Payer: Self-pay | Admitting: Internal Medicine

## 2019-07-03 DIAGNOSIS — I10 Essential (primary) hypertension: Secondary | ICD-10-CM | POA: Insufficient documentation

## 2019-07-03 DIAGNOSIS — C182 Malignant neoplasm of ascending colon: Secondary | ICD-10-CM | POA: Diagnosis not present

## 2019-07-03 DIAGNOSIS — Z79899 Other long term (current) drug therapy: Secondary | ICD-10-CM | POA: Diagnosis not present

## 2019-07-03 DIAGNOSIS — M899 Disorder of bone, unspecified: Secondary | ICD-10-CM | POA: Insufficient documentation

## 2019-07-03 DIAGNOSIS — Z9049 Acquired absence of other specified parts of digestive tract: Secondary | ICD-10-CM | POA: Diagnosis not present

## 2019-07-03 DIAGNOSIS — Z87891 Personal history of nicotine dependence: Secondary | ICD-10-CM | POA: Diagnosis not present

## 2019-07-03 DIAGNOSIS — Z7982 Long term (current) use of aspirin: Secondary | ICD-10-CM | POA: Insufficient documentation

## 2019-07-03 DIAGNOSIS — J449 Chronic obstructive pulmonary disease, unspecified: Secondary | ICD-10-CM | POA: Insufficient documentation

## 2019-07-03 DIAGNOSIS — C189 Malignant neoplasm of colon, unspecified: Secondary | ICD-10-CM | POA: Diagnosis not present

## 2019-07-03 LAB — GLUCOSE, CAPILLARY: Glucose-Capillary: 81 mg/dL (ref 70–99)

## 2019-07-03 MED ORDER — FLUDEOXYGLUCOSE F - 18 (FDG) INJECTION
8.3000 | Freq: Once | INTRAVENOUS | Status: AC | PRN
  Administered 2019-07-03: 8.9 via INTRAVENOUS

## 2019-07-04 ENCOUNTER — Telehealth: Payer: Self-pay | Admitting: Internal Medicine

## 2019-07-04 ENCOUNTER — Other Ambulatory Visit: Payer: Self-pay | Admitting: Internal Medicine

## 2019-07-04 DIAGNOSIS — K769 Liver disease, unspecified: Secondary | ICD-10-CM

## 2019-07-04 NOTE — Telephone Encounter (Signed)
On 1/04-spoke to patient's daughter Shauna Hugh regarding the results of the PET scan-concerning for metastatic lesions in the liver.  Question recurrent colon cancer/given elevated CEA.  Recommend further work-up with MRI of the liver/ultrasound-guided biopsy.  #Please schedule-MRI liver ASAP; ultrasound guided liver biopsy ASAP  #Follow-up-MD; no labs-3 days post biopsy.   -------------------------------------------------------------------------- FYI-Dr.Johnson.

## 2019-07-04 NOTE — Telephone Encounter (Signed)
Joanna Rodriguez spoke with daughter. Patient currently resides at [facility]. Daughter asks that all apts be coordinated through her and she would communicate the apts to [facility].

## 2019-07-04 NOTE — Progress Notes (Signed)
MRI/US biopsy orders are in.

## 2019-07-06 ENCOUNTER — Other Ambulatory Visit: Payer: Medicare Other

## 2019-07-06 ENCOUNTER — Telehealth: Payer: Self-pay | Admitting: Internal Medicine

## 2019-07-06 NOTE — Telephone Encounter (Signed)
Reviewed the imaging at the tumor conference-  H/T- please inform pt/daughter, Diane that it is recommended to have the MRI liver PRIOR to liver biopsy.  C- please RE-schedule the US liver biopsy AFTER the MRI liver is done.   RE-Schedule the appt with me 2-3 days after the liver biopsy.   GB

## 2019-07-06 NOTE — Telephone Encounter (Signed)
RN Spoke with Pamala Hurry in scheduling. Will r/s apts for bx.

## 2019-07-06 NOTE — Progress Notes (Signed)
Tumor Board Documentation  Joanna Rodriguez was presented by Dr Rogue Bussing at our Tumor Board on 07/06/2019, which included representatives from medical oncology, radiation oncology, navigation, pathology, radiology, surgical oncology, surgical, internal medicine, pharmacy, genetics, palliative care, research.  Joanna Rodriguez currently presents as a new patient, for discussion with history of the following treatments: active survellience.  Additionally, we reviewed previous medical and familial history, history of present illness, and recent lab results along with all available histopathologic and imaging studies. The tumor board considered available treatment options and made the following recommendations: Additional screening, Biopsy Get MRI then schedule biopsy  The following procedures/referrals were also placed: No orders of the defined types were placed in this encounter.   Clinical Trial Status: not discussed   Staging used:    National site-specific guidelines   were discussed with respect to the case.  Tumor board is a meeting of clinicians from various specialty areas who evaluate and discuss patients for whom a multidisciplinary approach is being considered. Final determinations in the plan of care are those of the provider(s). The responsibility for follow up of recommendations given during tumor board is that of the provider.   Today's extended care, comprehensive team conference, Joanna Rodriguez was not present for the discussion and was not examined.   Multidisciplinary Tumor Board is a multidisciplinary case peer review process.  Decisions discussed in the Multidisciplinary Tumor Board reflect the opinions of the specialists present at the conference without having examined the patient.  Ultimately, treatment and diagnostic decisions rest with the primary provider(s) and the patient.

## 2019-07-07 ENCOUNTER — Ambulatory Visit: Payer: Medicare Other

## 2019-07-07 DIAGNOSIS — E039 Hypothyroidism, unspecified: Secondary | ICD-10-CM | POA: Diagnosis not present

## 2019-07-07 DIAGNOSIS — I10 Essential (primary) hypertension: Secondary | ICD-10-CM | POA: Diagnosis not present

## 2019-07-07 DIAGNOSIS — I629 Nontraumatic intracranial hemorrhage, unspecified: Secondary | ICD-10-CM | POA: Diagnosis not present

## 2019-07-07 DIAGNOSIS — I619 Nontraumatic intracerebral hemorrhage, unspecified: Secondary | ICD-10-CM | POA: Diagnosis not present

## 2019-07-07 DIAGNOSIS — I671 Cerebral aneurysm, nonruptured: Secondary | ICD-10-CM | POA: Diagnosis not present

## 2019-07-07 DIAGNOSIS — G9389 Other specified disorders of brain: Secondary | ICD-10-CM | POA: Diagnosis not present

## 2019-07-07 DIAGNOSIS — I729 Aneurysm of unspecified site: Secondary | ICD-10-CM | POA: Diagnosis not present

## 2019-07-08 ENCOUNTER — Ambulatory Visit: Payer: Medicare Other

## 2019-07-10 ENCOUNTER — Ambulatory Visit: Admission: RE | Admit: 2019-07-10 | Payer: Medicare Other | Source: Ambulatory Visit

## 2019-07-12 NOTE — Telephone Encounter (Signed)
Patient lives in facility. Spoke with Dr. Rogue Bussing - inquired if md wanted to do virtual visit with patient in facility and a place a secondary phone call with daughter or just speak to daughter alone.  MD states to leave apt as scheduled.

## 2019-07-12 NOTE — Telephone Encounter (Signed)
C- Please check with Diane/ pt's daughter- if we can have a virtual visit on 1/15 to discuss the results of MRI liver; and as we don't have the biopsy yet. I will be happy to talk to daughter if she wants.   Thanks GB

## 2019-07-13 ENCOUNTER — Telehealth: Payer: Self-pay | Admitting: *Deleted

## 2019-07-13 ENCOUNTER — Ambulatory Visit
Admission: RE | Admit: 2019-07-13 | Discharge: 2019-07-13 | Disposition: A | Discharge status: Home / Self Care | Payer: Medicare Other | Source: Ambulatory Visit | Attending: Internal Medicine | Admitting: Internal Medicine

## 2019-07-13 ENCOUNTER — Other Ambulatory Visit: Payer: Self-pay

## 2019-07-13 ENCOUNTER — Telehealth: Payer: Self-pay | Admitting: Internal Medicine

## 2019-07-13 DIAGNOSIS — K7689 Other specified diseases of liver: Secondary | ICD-10-CM | POA: Diagnosis not present

## 2019-07-13 DIAGNOSIS — K769 Liver disease, unspecified: Secondary | ICD-10-CM | POA: Insufficient documentation

## 2019-07-13 MED ORDER — GADOBUTROL 1 MMOL/ML IV SOLN
6.0000 mL | Freq: Once | INTRAVENOUS | Status: AC | PRN
  Administered 2019-07-13: 6 mL via INTRAVENOUS

## 2019-07-13 NOTE — Telephone Encounter (Signed)
apts obtained for US liver biopsy. Daughter preferred the following potential biopsy dates: 1/22, 1/28, 1/29 (first choice) or 1/20/, 1/21, 1/25 (2nd choice). Information-scheduling preference given to scheduling. Arranged apt for bx on 1/22 at 10 am (arrival) for a 11am procedure with Dr. Kathlene Cote. RN Spoke with patient's daughter, Shauna Hugh. Patient lives at facility. She will communicate to facility that patient needs to be NPO 6-8 hours prior to the apt. Pt may take her heart/bp meds (if necessary with a sip of water only). Daughter gave verbal understanding of the plan of care.

## 2019-07-13 NOTE — Telephone Encounter (Signed)
On 1/14- I spoke to pt's daughter re: MRI liver s/o malignancy. Would recommend follow up post biopsy. Cancel appts for 1/15.   Will discuss at MDT on 1/28.

## 2019-07-14 ENCOUNTER — Inpatient Hospital Stay: Payer: Medicare Other | Admitting: Internal Medicine

## 2019-07-18 DIAGNOSIS — Z1159 Encounter for screening for other viral diseases: Secondary | ICD-10-CM | POA: Diagnosis not present

## 2019-07-21 ENCOUNTER — Other Ambulatory Visit: Payer: Self-pay

## 2019-07-21 ENCOUNTER — Ambulatory Visit
Admission: RE | Admit: 2019-07-21 | Discharge: 2019-07-21 | Disposition: A | Discharge status: Home / Self Care | Payer: Medicare Other | Source: Ambulatory Visit | Attending: Internal Medicine | Admitting: Internal Medicine

## 2019-07-21 DIAGNOSIS — Z79899 Other long term (current) drug therapy: Secondary | ICD-10-CM | POA: Insufficient documentation

## 2019-07-21 DIAGNOSIS — I69228 Other speech and language deficits following other nontraumatic intracranial hemorrhage: Secondary | ICD-10-CM | POA: Diagnosis not present

## 2019-07-21 DIAGNOSIS — K769 Liver disease, unspecified: Secondary | ICD-10-CM | POA: Insufficient documentation

## 2019-07-21 DIAGNOSIS — K5000 Crohn's disease of small intestine without complications: Secondary | ICD-10-CM | POA: Diagnosis not present

## 2019-07-21 DIAGNOSIS — C787 Secondary malignant neoplasm of liver and intrahepatic bile duct: Secondary | ICD-10-CM | POA: Diagnosis not present

## 2019-07-21 DIAGNOSIS — M419 Scoliosis, unspecified: Secondary | ICD-10-CM | POA: Insufficient documentation

## 2019-07-21 DIAGNOSIS — R160 Hepatomegaly, not elsewhere classified: Secondary | ICD-10-CM | POA: Insufficient documentation

## 2019-07-21 DIAGNOSIS — Z7982 Long term (current) use of aspirin: Secondary | ICD-10-CM | POA: Insufficient documentation

## 2019-07-21 DIAGNOSIS — C189 Malignant neoplasm of colon, unspecified: Secondary | ICD-10-CM | POA: Diagnosis not present

## 2019-07-21 DIAGNOSIS — J449 Chronic obstructive pulmonary disease, unspecified: Secondary | ICD-10-CM | POA: Diagnosis not present

## 2019-07-21 DIAGNOSIS — C19 Malignant neoplasm of rectosigmoid junction: Secondary | ICD-10-CM | POA: Diagnosis not present

## 2019-07-21 DIAGNOSIS — Z7901 Long term (current) use of anticoagulants: Secondary | ICD-10-CM | POA: Diagnosis not present

## 2019-07-21 DIAGNOSIS — E039 Hypothyroidism, unspecified: Secondary | ICD-10-CM | POA: Diagnosis not present

## 2019-07-21 DIAGNOSIS — M0580 Other rheumatoid arthritis with rheumatoid factor of unspecified site: Secondary | ICD-10-CM | POA: Diagnosis not present

## 2019-07-21 DIAGNOSIS — R52 Pain, unspecified: Secondary | ICD-10-CM | POA: Diagnosis not present

## 2019-07-21 DIAGNOSIS — Z85038 Personal history of other malignant neoplasm of large intestine: Secondary | ICD-10-CM | POA: Diagnosis not present

## 2019-07-21 DIAGNOSIS — J9383 Other pneumothorax: Secondary | ICD-10-CM | POA: Insufficient documentation

## 2019-07-21 DIAGNOSIS — I10 Essential (primary) hypertension: Secondary | ICD-10-CM | POA: Insufficient documentation

## 2019-07-21 DIAGNOSIS — Z87891 Personal history of nicotine dependence: Secondary | ICD-10-CM | POA: Insufficient documentation

## 2019-07-21 DIAGNOSIS — E569 Vitamin deficiency, unspecified: Secondary | ICD-10-CM | POA: Diagnosis not present

## 2019-07-21 DIAGNOSIS — Z9049 Acquired absence of other specified parts of digestive tract: Secondary | ICD-10-CM | POA: Diagnosis not present

## 2019-07-21 DIAGNOSIS — N390 Urinary tract infection, site not specified: Secondary | ICD-10-CM | POA: Diagnosis not present

## 2019-07-21 DIAGNOSIS — K219 Gastro-esophageal reflux disease without esophagitis: Secondary | ICD-10-CM | POA: Diagnosis not present

## 2019-07-21 DIAGNOSIS — C182 Malignant neoplasm of ascending colon: Secondary | ICD-10-CM | POA: Diagnosis not present

## 2019-07-21 DIAGNOSIS — I629 Nontraumatic intracranial hemorrhage, unspecified: Secondary | ICD-10-CM | POA: Diagnosis not present

## 2019-07-21 LAB — CBC
HCT: 41.5 % (ref 36.0–46.0)
Hemoglobin: 13.8 g/dL (ref 12.0–15.0)
MCH: 34.5 pg — ABNORMAL HIGH (ref 26.0–34.0)
MCHC: 33.3 g/dL (ref 30.0–36.0)
MCV: 103.8 fL — ABNORMAL HIGH (ref 80.0–100.0)
Platelets: 414 10*3/uL — ABNORMAL HIGH (ref 150–400)
RBC: 4 MIL/uL (ref 3.87–5.11)
RDW: 14.9 % (ref 11.5–15.5)
WBC: 7.7 10*3/uL (ref 4.0–10.5)
nRBC: 0 % (ref 0.0–0.2)

## 2019-07-21 LAB — PROTIME-INR
INR: 1.1 (ref 0.8–1.2)
Prothrombin Time: 13.8 seconds (ref 11.4–15.2)

## 2019-07-21 LAB — APTT: aPTT: 39 seconds — ABNORMAL HIGH (ref 24–36)

## 2019-07-21 MED ORDER — MIDAZOLAM HCL 2 MG/2ML IJ SOLN
INTRAMUSCULAR | Status: AC | PRN
  Administered 2019-07-21 (×2): 1 mg via INTRAVENOUS

## 2019-07-21 MED ORDER — FENTANYL CITRATE (PF) 100 MCG/2ML IJ SOLN
INTRAMUSCULAR | Status: AC
  Filled 2019-07-21: qty 2

## 2019-07-21 MED ORDER — SODIUM CHLORIDE 0.9 % IV SOLN: INTRAVENOUS | Status: DC

## 2019-07-21 MED ORDER — MIDAZOLAM HCL 2 MG/2ML IJ SOLN
INTRAMUSCULAR | Status: AC
  Filled 2019-07-21: qty 2

## 2019-07-21 MED ORDER — FENTANYL CITRATE (PF) 100 MCG/2ML IJ SOLN
INTRAMUSCULAR | Status: AC | PRN
  Administered 2019-07-21 (×2): 50 ug via INTRAVENOUS

## 2019-07-21 NOTE — Procedures (Signed)
Interventional Radiology Procedure Note  Procedure: US Guided Biopsy of liver mass  Complications: None  Estimated Blood Loss: < 10 mL  Findings: 18 G core biopsy of right lobe liver lesion performed under US guidance.  Three core samples obtained and sent to Pathology.  Venetia Night. Kathlene Cote, M.D Pager:  [phone]

## 2019-07-21 NOTE — Progress Notes (Signed)
Patient clinically stable post US Liver biopsy with Dr Kathlene Cote, tolerated well with vitals stable throughout. Denies complaints at this time. Awake/alert and oriented post procedure. Received Versed 11m along with Fentanyl 1012m IV for procedure. bandade dressing dry and intact.

## 2019-07-21 NOTE — H&P (Signed)
Chief Complaint: Patient was seen in consultation today for liver biopsy at the request of Brahmanday,Govinda R  Referring Physician(s): Brahmanday,Govinda R  Patient Status: [facility] - Out-pt  History of Present Illness: Joanna Rodriguez is a 69 y.o. female with a history of stage I adenocarcinoma of the ascending colon treated by right hemicolectomy in March, 2019. Recent workup for 9 mm C6 lesion revealed hypermetabolic liver lesions by PET on 07/03/19. MRI on 1/14 shows 2.5 cm right lobe lesion near dome and 3.6 cm lesion in inferior tip of right lobe. CEA elevated. Patient referred for liver biopsy to confirm metastatic disease.  Past Medical History:  Diagnosis Date  . Arthritis   . Cancer [facility])    face  . Complication of anesthesia    all anesthesia events in 2019 have worsened vertigo issues  . COPD (chronic obstructive pulmonary disease) ([place])   . Crohn's disease ([place])    1989 ?  Marland Kitchen Dyspnea on exertion   . Gastritis   . Genital herpes   . H/O pneumothorax    x 2  . History of kidney stones   . Hypertension   . Hypothyroid   . Intestinal metaplasia of gastric mucosa   . Kidney stones   . Osteopenia   . Personal history of tobacco use, presenting hazards to health 10/23/2015  . Polyp of sigmoid colon   . Scoliosis   . Spontaneous pneumothorax   . Trouble swallowing   . Vertigo       . [facility])     Past Surgical History:  Procedure Laterality Date  . ABDOMINAL HYSTERECTOMY    . APPENDECTOMY    . BREAST EXCISIONAL BIOPSY Bilateral 1990's   NEG  . CAPSULOTOMY METATARSOPHALANGEAL Left 03/19/2016   Procedure: CAPSULOTOMY METATARSOPHALANGEAL;  Surgeon: Albertine Patricia, DPM;  Location: [place];  Service: Podiatry;  Laterality: Left;  Second left toe.  Marland Kitchen COLONOSCOPY WITH PROPOFOL N/A 08/05/2017   Procedure: COLONOSCOPY WITH PROPOFOL;  Surgeon: Lucilla Lame, MD;  Location: [place];  Service: Endoscopy;  Laterality: N/A;  . COLONOSCOPY  WITH PROPOFOL N/A 01/06/2018   Procedure: COLONOSCOPY WITH PROPOFOL;  Surgeon: Lucilla Lame, MD;  Location: [place];  Service: Endoscopy;  Laterality: N/A;  . ESOPHAGOGASTRODUODENOSCOPY (EGD) WITH PROPOFOL N/A 11/21/2015   Procedure: ESOPHAGOGASTRODUODENOSCOPY (EGD) WITH PROPOFOL with dialation;  Surgeon: Lucilla Lame, MD;  Location: [place];  Service: Endoscopy;  Laterality: N/A;  . ESOPHAGOGASTRODUODENOSCOPY (EGD) WITH PROPOFOL N/A 01/06/2018   Procedure: ESOPHAGOGASTRODUODENOSCOPY (EGD) WITH biopsy.;  Surgeon: Lucilla Lame, MD;  Location: [place];  Service: Endoscopy;  Laterality: N/A;  . ESOPHAGOGASTRODUODENOSCOPY (EGD) WITH PROPOFOL N/A 03/03/2018   Procedure: ESOPHAGOGASTRODUODENOSCOPY (EGD) WITH PROPOFOL with biopsies;  Surgeon: Lucilla Lame, MD;  Location: [place];  Service: Endoscopy;  Laterality: N/A;  . FLEXOR TENDON REPAIR Left 03/19/2016   Procedure: FLEXOR TENDON release, percutaneous.;  Surgeon: Albertine Patricia, DPM;  Location: [place];  Service: Podiatry;  Laterality: Left;  4th left toe.  Marland Kitchen HAMMER TOE SURGERY Left 03/19/2016   Procedure: HAMMER TOE CORRECTION with PIPjoint fusion;  Surgeon: Albertine Patricia, DPM;  Location: [place];  Service: Podiatry;  Laterality: Left;  Third left toe.  Marland Kitchen HAMMER TOE SURGERY Right 08/06/2016   Procedure: HAMMER TOE CORRECTION  RIGHT 2ND AND 3RD;  Surgeon: Albertine Patricia, DPM;  Location: [place];  Service: Podiatry;  Laterality: Right;  LMA with local Special Needs:  [place]  [employer] Needs to be  2nd patient per office  . LAPAROSCOPIC RIGHT COLECTOMY Right 08/27/2017   Procedure: LAPAROSCOPIC RIGHT COLECTOMY;  Surgeon: Robert Bellow, MD;  Location: [facility];  Service: General;  Laterality: Right;  . POLYPECTOMY  08/05/2017   Procedure: POLYPECTOMY;  Surgeon: Lucilla Lame, MD;  Location: [place];  Service: Endoscopy;;  . POLYPECTOMY  01/06/2018   Procedure:  POLYPECTOMY INTESTINAL;  Surgeon: Lucilla Lame, MD;  Location: [place];  Service: Endoscopy;;  . TALC PLEURODESIS  07/2014   left side  . THORACOSCOPY  07/2014   left side  . TUBAL LIGATION    . [place], with small bowel resection  . WEIL OSTEOTOMY Right 08/06/2016   Procedure: WEIL right 2nd & 3rd;  Surgeon: Albertine Patricia, DPM;  Location: [place];  Service: Podiatry;  Laterality: Right;    Allergies: Nsaids  Medications: Prior to Admission medications   Medication Sig Start Date End Date Taking? Authorizing Provider  acetaminophen (TYLENOL) 325 MG tablet Take 650 mg by mouth every 6 (six) hours as needed.   Yes [facility], [facility]  acyclovir (ZOVIRAX) 400 MG tablet Take 400 mg by mouth 5 (five) times daily. One a day   Yes [provider]  albuterol (VENTOLIN HFA) 108 (90 Base) MCG/ACT inhaler Inhale 2 puffs into the lungs every 6 (six) hours as needed for wheezing or shortness of breath. 03/02/18  Yes Wilhelmina Mcardle, MD  amLODipine (NORVASC) 10 MG tablet Take 1 tablet by mouth daily. 03/17/19  Yes [provider]  aspirin EC 81 MG tablet Take 81 mg by mouth daily.   Yes [provider]  calcium carbonate (OSCAL) 1500 (600 Ca) MG TABS tablet Take by mouth 2 (two) times daily with a meal.   Yes [provider]  donepezil (ARICEPT) 5 MG tablet Take 1 tablet by mouth daily. 05/19/19  Yes [provider]  Glucosamine-Chondroit-Vit C-Mn (GLUCOSAMINE-CHONDROITIN) TABS Take by mouth.   Yes [provider]  levothyroxine (SYNTHROID, LEVOTHROID) 100 MCG tablet Take 1 tablet (100 mcg total) by mouth daily. 07/07/18  Yes Johnson, Megan P, DO  lisinopril (ZESTRIL) 5 MG tablet Take 1 tablet (5 mg total) by mouth daily. 02/21/19  Yes Johnson, Megan P, DO  Melatonin 5 MG TABS Take 10 mg by mouth at bedtime as needed (for sleep.).  08/03/14  Yes [provider]  mercaptopurine (PURINETHOL) 50  MG tablet TAKE 1 AND 1/2 TABLETS EVERY DAY 09/07/18  Yes Lucilla Lame, MD  mirtazapine (REMERON) 7.5 MG tablet Take 7.5 mg by mouth at bedtime.   Yes [place], [facility]  Multiple Vitamins-Minerals (MULTIVITAMIN WITH MINERALS) tablet Take 1 tablet by mouth daily. One-A-Day for 50+ 03/10/14  Yes [provider]  pantoprazole (PROTONIX) 40 MG tablet TAKE 1 TABLET BY MOUTH TWICE A DAY 05/05/19  Yes Lucilla Lame, MD  Polyethyl Glycol-Propyl Glycol (SYSTANE) 0.4-0.3 % SOLN Apply to eye.   Yes [provider]  sertraline (ZOLOFT) 100 MG tablet Take 50 mg by mouth daily.   Yes [place], [facility]  traMADol (ULTRAM) 50 MG tablet Take 50 mg by mouth every 6 (six) hours as needed.   Yes [place], [facility]  vitamin B-12 (CYANOCOBALAMIN) 1000 MCG tablet Take 1,000 mcg by mouth daily.   Yes [provider]     Family History  Problem Relation Age of Onset  . Hypertension Mother   . Hypothyroidism Mother   . Alcohol abuse Mother   .  Breast cancer Mother 34  . Hyperlipidemia Mother   . Mental illness Mother   . Heart attack Father   . Heart disease Father   . Glaucoma Father   . Colon cancer Father 73  . Diabetes Father   . Hypertension Father   . Heart disease Sister   . Hyperlipidemia Sister   . Hypertension Sister   . Lung disease Sister     Social History   Socioeconomic History  . Marital status: Divorced    Spouse name: Not on file  . Number of children: Not on file  . Years of education: GED, LPN, pharmacy tech   . Highest education level: Associate degree: occupational, Hotel manager, or vocational program  Occupational History  . Occupation: retired    Comment: Forensic scientist Rep - stocking\packing  Tobacco Use  . Smoking status: Former Smoker    Packs/day: 1.50    Years: 53.00    Pack years: 79.50    Types: Cigarettes    Quit date: 08/22/2014    Years since quitting: 4.9  . Smokeless tobacco: Never Used  Substance and Sexual Activity  .  Alcohol use: Not Currently    Alcohol/week: 0.0 standard drinks    Comment: occasional - 2-3x a year   . Drug use: No  . Sexual activity: Never  Other Topics Concern  . Not on file  Social History Narrative   Currently at [facility].  Walks by herself.  History of smoking.  No alcohol.    Social Determinants of Health   Financial Resource Strain:   . Difficulty of Paying Living Expenses: Not on file  Food Insecurity:   . Worried About Charity fundraiser in the Last Year: Not on file  . Ran Out of Food in the Last Year: Not on file  Transportation Needs:   . Lack of Transportation (Medical): Not on file  . Lack of Transportation (Non-Medical): Not on file  Physical Activity: Insufficiently Active  . Days of Exercise per Week: 3 days  . Minutes of Exercise per Session: 30 min  Stress:   . Feeling of Stress : Not on file  Social Connections:   . Frequency of Communication with Friends and Family: Not on file  . Frequency of Social Gatherings with Friends and Family: Not on file  . Attends Religious Services: Not on file  . Active Member of Clubs or Organizations: Not on file  . Attends Archivist Meetings: Not on file  . Marital Status: Not on file    ECOG Status: 0 - Asymptomatic  Review of Systems: A 12 point ROS discussed and pertinent positives are indicated in the HPI above.  All other systems are negative.  Review of Systems  Constitutional: Negative.   HENT: Negative.   Respiratory: Negative.   Cardiovascular: Negative.   Gastrointestinal: Negative.   Genitourinary: Negative.   Musculoskeletal: Negative.   Neurological: Negative.   Psychiatric/Behavioral: Positive for confusion.    Vital Signs: BP 112/62   Resp 18   Ht 4' 11"  (1.499 m)   Wt 61.1 kg   LMP 06/03/1972   SpO2 98%   BMI 27.19 kg/m   Physical Exam Vitals reviewed.  Constitutional:      General: She is not in acute distress.    Appearance: She is not ill-appearing,  toxic-appearing or diaphoretic.  HENT:     Head: Normocephalic and atraumatic.  Cardiovascular:     Rate and Rhythm: Normal rate and regular rhythm.  Heart sounds: Normal heart sounds. No murmur. No friction rub. No gallop.   Pulmonary:     Effort: Pulmonary effort is normal. No respiratory distress.     Breath sounds: Normal breath sounds. No stridor. No wheezing, rhonchi or rales.  Abdominal:     General: Abdomen is flat. There is no distension.     Palpations: Abdomen is soft. There is no mass.     Tenderness: There is no abdominal tenderness. There is no guarding or rebound.     Hernia: No hernia is present.  Musculoskeletal:        General: No swelling.     Cervical back: Neck supple.  Skin:    General: Skin is warm and dry.  Neurological:     General: No focal deficit present.     Mental Status: Mental status is at baseline.     Imaging: MR LIVER W WO CONTRAST  Result Date: 07/13/2019 CLINICAL DATA:  History of colon cancer. Subtle hypermetabolic liver lesions on recent PET-CT. EXAM: MRI ABDOMEN WITHOUT AND WITH CONTRAST TECHNIQUE: Multiplanar multisequence MR imaging of the abdomen was performed both before and after the administration of intravenous contrast. CONTRAST:  1m GADAVIST GADOBUTROL 1 MMOL/ML IV SOLN COMPARISON:  PET-CT 07/03/2019 FINDINGS: Lower chest: UNREMARKABLE. Hepatobiliary: 2.5 cm T1 hypointense, T2 hyperintense rim enhancing lesion is identified in the extreme dome of liver. This lesion restricts diffusion and corresponds to the hypermetabolic focus noted on the recent PET-CT. 3.6 x 3.2 cm T1 hypointense, minimally T2 hyperintense lesion is identified in the inferior tip of the right liver. This shows heterogeneous enhancement after IV contrast administration with a slight rim predominant pattern. This lesion also restricts diffusion and was seen to be hypermetabolic on the recent PET-CT. Tiny 3-4 mm cyst noted medial right liver on T2 axial 11 of series 5.  Layering sludge noted in the lumen of the gallbladder No intrahepatic or extrahepatic biliary dilation. Pancreas: No focal mass lesion. No dilatation of the main duct. No intraparenchymal cyst. No peripancreatic edema. Spleen:  No splenomegaly. No focal mass lesion. Adrenals/Urinary Tract: No adrenal nodule or mass. Kidneys unremarkable. Stomach/Bowel: Tiny hiatal hernia. Stomach otherwise unremarkable. Duodenum is normally positioned as is the ligament of Treitz. No small bowel or colonic dilatation within the visualized abdomen. Vascular/Lymphatic: No abdominal aortic aneurysm. No abdominal lymphadenopathy. Other:  No intraperitoneal free fluid. Musculoskeletal: Small midline ventral hernia contains a short segment of transverse colon without complicating features. No abnormal marrow enhancement within the visualized bony anatomy. IMPRESSION: 1. 2.5 cm rim enhancing lesion in the hepatic dome and 3.6 cm lesion in the inferior tip of the right liver correspond to the areas of hypermetabolism seen on the recent PET-CT and are consistent with metastatic disease. No other soft tissue metastases noted in the abdomen. 2. Tiny hiatal hernia. Electronically Signed   By: EMisty StanleyM.D.   On: 07/13/2019 10:32   NM PET Image Initial (PI) Skull Base To Thigh  Result Date: 07/03/2019 CLINICAL DATA:  Subsequent treatment strategy for colon cancer. C7 osseous lesion. EXAM: NUCLEAR MEDICINE PET SKULL BASE TO THIGH TECHNIQUE: 8.9 mCi F-18 FDG was injected intravenously. Full-ring PET imaging was performed from the skull base to thigh after the radiotracer. CT data was obtained and used for attenuation correction and anatomic localization. Fasting blood glucose: 81 mg/dl COMPARISON:  MRI cervical spine dated 06/05/2019. Low-dose lung cancer screening CT chest dated 12/20/2018. FINDINGS: Mediastinal blood pool activity: SUV max 2.7 Liver activity: SUV max NA NECK:  No hypermetabolic cervical lymphadenopathy. Incidental CT  findings: none CHEST: Multifocal pleural-based hypermetabolism in the left hemithorax, including at the left lung apex. Representative max SUV 17.2 at the left lung apex. Underlying calcified pleural base lesions. This appearance favors prior talc pleurodesis. No suspicious pulmonary nodules. No hypermetabolic thoracic lymphadenopathy. Incidental CT findings: Atherosclerotic calcifications of the aortic root/arch. Mild coronary atherosclerosis of the LAD. ABDOMEN/PELVIS: Focal hypermetabolism at the right hepatic dome, max SUV 6.8, with suspected underlying 1.8 cm lesion (series 4/image 110). Additional focal hypermetabolism posteriorly within segment 6, max SUV 12.9, with suspected underlying lesion measuring at least 2.8 cm (series 4/image 154). These findings are suspicious for hepatic metastases. No abnormal hypermetabolism in the spleen, pancreas, or adrenal glands. No hypermetabolic abdominopelvic lymphadenopathy. Status post right hemicolectomy. Incidental CT findings: Tiny hiatal hernia. Atherosclerotic calcifications the abdominal aorta and branch vessels. 4 mm nonobstructing left lower pole renal calculus (series 4/image 145). Mild diastasis of the midline anterior abdominal wall. Status post hysterectomy. SKELETON: No focal hypermetabolic activity to suggest skeletal metastasis. Specifically, no focal hypermetabolism at C7 to correspond to the suspected MRI abnormality. Incidental CT findings: Mild degenerative changes of the visualized thoracolumbar spine. Lumbar levoscoliosis. IMPRESSION: Status post right hemicolectomy. Two suspected hepatic metastases in the right hepatic lobe, as above. Consider MRI abdomen with/without contrast for further evaluation, as clinically warranted. Correlate with CEA. No findings suspicious for osseous metastases. Specifically, no focal hypermetabolism at C7 to correspond to the suspected MRI abnormality. Multifocal pleural-based hypermetabolism in the left hemithorax,  most prominent at the left lung apex, with associated calcified pleural plaques. This appearance favors sequela of prior talc pleurodesis. Electronically Signed   By: Julian Hy M.D.   On: 07/03/2019 12:58    Labs:  CBC: Recent Labs    01/30/19 1210 02/21/19 1550 06/20/19 1133 07/21/19 1009  WBC 6.4 6.3 7.9 7.7  HGB 14.3 13.5 13.9 13.8  HCT 40.3 39.2 42.6 41.5  PLT 374 410 303 414*    COAGS: Recent Labs    03/10/19 2018 07/21/19 1009  INR 1.2 1.1  APTT 39* 39*    BMP: Recent Labs    01/30/19 1428 02/21/19 1550 06/05/19 1726 06/20/19 1133  NA 142 143  --  139  K 4.6 4.7  --  4.0  CL 101 101  --  103  CO2 23 26  --  25  GLUCOSE 88 93  --  119*  BUN 15 16  --  14  CALCIUM 9.5 10.4*  --  9.8  CREATININE 0.73 0.80 0.90 0.78  GFRNONAA 84 75  --  >60  GFRAA 96 86  --  >60    LIVER FUNCTION TESTS: Recent Labs    01/30/19 1428 02/21/19 1550 06/20/19 1133  BILITOT 0.3 0.3 0.7  AST 35 34 26  ALT 49* 53* 22  ALKPHOS 64 58 66  PROT 6.8 6.6 7.9  ALBUMIN 4.2 3.9 4.1    TUMOR MARKERS: CEA 38.8  Assessment and Plan:  For US guided liver lesion biopsy; will target inferior right lobe mass. Consent obtained from daughter as patient has a history of vascular dementia. Risks and benefits of liver biopsy was discussed with the patient and/or patient's family including, but not limited to bleeding, infection, damage to adjacent structures or low yield requiring additional tests.  All of the questions were answered and there is agreement to proceed. Consent signed and in chart.  Thank you for this interesting consult.  I greatly enjoyed meeting Joanna Rodriguez  Lillo and look forward to participating in their care.  A copy of this report was sent to the requesting provider on this date.  Electronically Signed: Azzie Roup, MD 07/21/2019, 10:46 AM     I spent a total of 30 Minutes  in face to face in clinical consultation, greater than 50% of which was  counseling/coordinating care for liver biopsy.

## 2019-07-21 NOTE — Progress Notes (Signed)
Dr. Kathlene Cote at bedside, speaking with pt. And her daughter Diane JU:DILOK biopsy procedure. Both verbalize understanding of conversation.

## 2019-07-24 ENCOUNTER — Other Ambulatory Visit: Payer: Self-pay | Admitting: Pathology

## 2019-07-24 LAB — SURGICAL PATHOLOGY

## 2019-07-25 ENCOUNTER — Other Ambulatory Visit: Payer: Self-pay

## 2019-07-25 DIAGNOSIS — Z1159 Encounter for screening for other viral diseases: Secondary | ICD-10-CM | POA: Diagnosis not present

## 2019-07-25 NOTE — Progress Notes (Signed)
Patient pre screened for office appointment, no questions or concerns today. Patient reminded of upcoming appointment time and date. Patient at [facility] daughter provided information for screening.

## 2019-07-26 ENCOUNTER — Inpatient Hospital Stay: Payer: Medicare Other | Attending: Internal Medicine | Admitting: Internal Medicine

## 2019-07-26 ENCOUNTER — Other Ambulatory Visit: Payer: Self-pay

## 2019-07-26 DIAGNOSIS — M8580 Other specified disorders of bone density and structure, unspecified site: Secondary | ICD-10-CM | POA: Insufficient documentation

## 2019-07-26 DIAGNOSIS — I10 Essential (primary) hypertension: Secondary | ICD-10-CM | POA: Diagnosis not present

## 2019-07-26 DIAGNOSIS — F0150 Vascular dementia without behavioral disturbance: Secondary | ICD-10-CM | POA: Insufficient documentation

## 2019-07-26 DIAGNOSIS — Z86010 Personal history of colonic polyps: Secondary | ICD-10-CM | POA: Diagnosis not present

## 2019-07-26 DIAGNOSIS — G8929 Other chronic pain: Secondary | ICD-10-CM | POA: Diagnosis not present

## 2019-07-26 DIAGNOSIS — K5090 Crohn's disease, unspecified, without complications: Secondary | ICD-10-CM | POA: Diagnosis not present

## 2019-07-26 DIAGNOSIS — C787 Secondary malignant neoplasm of liver and intrahepatic bile duct: Secondary | ICD-10-CM | POA: Insufficient documentation

## 2019-07-26 DIAGNOSIS — Z87891 Personal history of nicotine dependence: Secondary | ICD-10-CM | POA: Insufficient documentation

## 2019-07-26 DIAGNOSIS — Z79899 Other long term (current) drug therapy: Secondary | ICD-10-CM | POA: Diagnosis not present

## 2019-07-26 DIAGNOSIS — J449 Chronic obstructive pulmonary disease, unspecified: Secondary | ICD-10-CM | POA: Insufficient documentation

## 2019-07-26 DIAGNOSIS — E039 Hypothyroidism, unspecified: Secondary | ICD-10-CM | POA: Insufficient documentation

## 2019-07-26 DIAGNOSIS — G919 Hydrocephalus, unspecified: Secondary | ICD-10-CM | POA: Insufficient documentation

## 2019-07-26 DIAGNOSIS — N200 Calculus of kidney: Secondary | ICD-10-CM | POA: Insufficient documentation

## 2019-07-26 DIAGNOSIS — Z87442 Personal history of urinary calculi: Secondary | ICD-10-CM | POA: Insufficient documentation

## 2019-07-26 DIAGNOSIS — Z803 Family history of malignant neoplasm of breast: Secondary | ICD-10-CM | POA: Diagnosis not present

## 2019-07-26 DIAGNOSIS — C182 Malignant neoplasm of ascending colon: Secondary | ICD-10-CM | POA: Diagnosis not present

## 2019-07-26 DIAGNOSIS — C7951 Secondary malignant neoplasm of bone: Secondary | ICD-10-CM | POA: Diagnosis not present

## 2019-07-26 DIAGNOSIS — Z7982 Long term (current) use of aspirin: Secondary | ICD-10-CM | POA: Insufficient documentation

## 2019-07-26 DIAGNOSIS — M129 Arthropathy, unspecified: Secondary | ICD-10-CM | POA: Diagnosis not present

## 2019-07-26 NOTE — Assessment & Plan Note (Addendum)
#  Recurrent/stage IV colon cancer.  Discussed is rather unfortunate that patient's prior stage I colon cancer is currently recurrent in the liver-s/p biopsy-adenocarcinoma; elevated CEA. Send for NGS.   #I discussed the stage and pathology with the patient and family in detail.  Treatment would be palliative not curative.   #Discussed the treatment options include systemic therapy with FOLFOX; I discussed that FOLFOX chemotherapy is given every 2 weeks; discuss the potential side effects including but not limited to nausea vomiting diarrhea, sores in the mouth, hand-foot syndrome; also tingling and numbness/cold sensitivity with oxaliplatin.  However suspect patient will have poor tolerance to oxaliplatin based therapy; I would recommend 5-FU continuous infusion every 2 weeks.  Also discussed option of Xeloda/pill as alternative to infusion.  #Also discussed the option of possible liver directed local therapies with interventional radiology.  Patient is not candidate for resection.  However all treatments will be considered palliative.  Will review tumor conference on 1/28.   #After extensive discussion about the multiple options-patient seems to be reluctant with any therapies.  Discussed that median survival is around 24 months.  However, without treatment survival is in the order of few months.  Palliative care/hosp[facility] evaluation at [facility].  No decisions made yet.  We will proceed with palliative care evaluation in 2 weeks.  #Abdominal pain-likely secondary to progressive malignancy recommend tramadol/Tylenol as needed.  #Vascular dementia hydrocephalus followed by neurology; stable.  #Chronic back pain-arthritis-stable  # # Palliative care evaluation: Introduced palliative care philosophy and services. I discussed the need for palliative care evaluation/symptom management to help quality of life in the context of incurable disease.  Patient is interested; will make referral.  #  DISPOSITION: # Palliative care referral- Josh in 2 weeks # Follow up 2 weeks-MD;  Dr.B   # I reviewed the blood work- with the patient in detail; also reviewed the imaging independently [as summarized above]; and with the patient in detail.    Ml: Dr.Shah/Konz/Dr.Bronsetin/ Dr.Johnson

## 2019-07-26 NOTE — Progress Notes (Signed)
[facility] [facility] CONSULT NOTE  Patient Care Team: Valerie Roys, DO as PCP - General (Family Medicine) Lucilla Lame, MD as Consulting Physician (Gastroenterology) Wilhelmina Mcardle, MD (Inactive) as Consulting Physician (Pulmonary Disease)  CHIEF COMPLAINTS/PURPOSE OF CONSULTATION: Bone lesion  #  Oncology History Overview Note  # VASCULAR DEMENTIA [Dr.Shah]- Indeterminate 9 mm somewhat rounded focus of edema signal and enhancement within the anterosuperior C6 vertebral body. Contrast-enhanced MRI follow-up is recommended to ensure stability, particularly given the provided history of colon cancer.  #  MARCH 2019- COLON cancer [surgery ] pT2  pN0 - Stage I; MMR- STABLE.   #January 2021-MRI liver liver lesions; PET scan-liver lesion.  Ultrasound-guided biopsy-adenocarcinoma; colon primary.  #   # NGS/MOLECULAR TESTS:O  # PALLIATIVE CARE EVALUATION:p  # PAIN MANAGEMENT: na   DIAGNOSIS: Colon cancer  STAGE:  IV       ;  GOALS: Palliative  CURRENT/MOST RECENT THERAPY : p    Primary adenocarcinoma of ascending colon ([place])  09/02/2017 Initial Diagnosis   Primary adenocarcinoma of ascending colon ([place])      HISTORY OF PRESENTING ILLNESS:  Joanna Rodriguez 69 y.o.  female with a history of smoking; and also history of vascular dementia [currently at [facility]] prior history of colon cancer is here for follow-up to discuss recent imaging findings/biopsy.  Patient continues to complain of chronic back pain.  However she noted to have worsening pain in the right flank/upper quadrant.  Hurts a deep breath.  Going on for the last few weeks-getting progressively worse.   Review of Systems  Constitutional: Positive for malaise/fatigue and weight loss. Negative for chills, diaphoresis and fever.  HENT: Negative for nosebleeds and sore throat.   Eyes: Negative for double vision.  Respiratory: Negative for cough, hemoptysis, sputum production, shortness of breath  and wheezing.   Cardiovascular: Negative for chest pain, palpitations, orthopnea and leg swelling.  Gastrointestinal: Positive for abdominal pain. Negative for blood in stool, constipation, diarrhea, heartburn, melena, nausea and vomiting.  Genitourinary: Negative for dysuria, frequency and urgency.  Musculoskeletal: Positive for back pain and joint pain.  Skin: Negative.  Negative for itching and rash.  Neurological: Positive for weakness. Negative for dizziness, tingling, focal weakness and headaches.  Endo/Heme/Allergies: Does not bruise/bleed easily.  Psychiatric/Behavioral: Negative for depression. The patient is not nervous/anxious and does not have insomnia.      MEDICAL HISTORY:  Past Medical History:  Diagnosis Date  . Arthritis   . Cancer [facility])    face  . Complication of anesthesia    all anesthesia events in 2019 have worsened vertigo issues  . COPD (chronic obstructive pulmonary disease) ([place])   . Crohn's disease ([place])    1989 ?  Marland Kitchen Dyspnea on exertion   . Gastritis   . Genital herpes   . H/O pneumothorax    x 2  . History of kidney stones   . Hypertension   . Hypothyroid   . Intestinal metaplasia of gastric mucosa   . Kidney stones   . Osteopenia   . Personal history of tobacco use, presenting hazards to health 10/23/2015  . Polyp of sigmoid colon   . Scoliosis   . Spontaneous pneumothorax   . Trouble swallowing   . Vertigo       . Volvulus ([place])     SURGICAL HISTORY: Past Surgical History:  Procedure Laterality Date  . ABDOMINAL HYSTERECTOMY    . APPENDECTOMY    . BREAST EXCISIONAL BIOPSY Bilateral 1990's  NEG  . CAPSULOTOMY METATARSOPHALANGEAL Left 03/19/2016   Procedure: CAPSULOTOMY METATARSOPHALANGEAL;  Surgeon: Albertine Patricia, DPM;  Location: [place];  Service: Podiatry;  Laterality: Left;  Second left toe.  Marland Kitchen COLONOSCOPY WITH PROPOFOL N/A 08/05/2017   Procedure: COLONOSCOPY WITH PROPOFOL;  Surgeon: Lucilla Lame, MD;  Location: [place];  Service: Endoscopy;  Laterality: N/A;  . COLONOSCOPY WITH PROPOFOL N/A 01/06/2018   Procedure: COLONOSCOPY WITH PROPOFOL;  Surgeon: Lucilla Lame, MD;  Location: [place];  Service: Endoscopy;  Laterality: N/A;  . ESOPHAGOGASTRODUODENOSCOPY (EGD) WITH PROPOFOL N/A 11/21/2015   Procedure: ESOPHAGOGASTRODUODENOSCOPY (EGD) WITH PROPOFOL with dialation;  Surgeon: Lucilla Lame, MD;  Location: [place];  Service: Endoscopy;  Laterality: N/A;  . ESOPHAGOGASTRODUODENOSCOPY (EGD) WITH PROPOFOL N/A 01/06/2018   Procedure: ESOPHAGOGASTRODUODENOSCOPY (EGD) WITH biopsy.;  Surgeon: Lucilla Lame, MD;  Location: [place];  Service: Endoscopy;  Laterality: N/A;  . ESOPHAGOGASTRODUODENOSCOPY (EGD) WITH PROPOFOL N/A 03/03/2018   Procedure: ESOPHAGOGASTRODUODENOSCOPY (EGD) WITH PROPOFOL with biopsies;  Surgeon: Lucilla Lame, MD;  Location: [place];  Service: Endoscopy;  Laterality: N/A;  . FLEXOR TENDON REPAIR Left 03/19/2016   Procedure: FLEXOR TENDON release, percutaneous.;  Surgeon: Albertine Patricia, DPM;  Location: [place];  Service: Podiatry;  Laterality: Left;  4th left toe.  Marland Kitchen HAMMER TOE SURGERY Left 03/19/2016   Procedure: HAMMER TOE CORRECTION with PIPjoint fusion;  Surgeon: Albertine Patricia, DPM;  Location: [place];  Service: Podiatry;  Laterality: Left;  Third left toe.  Marland Kitchen HAMMER TOE SURGERY Right 08/06/2016   Procedure: HAMMER TOE CORRECTION  RIGHT 2ND AND 3RD;  Surgeon: Albertine Patricia, DPM;  Location: [place];  Service: Podiatry;  Laterality: Right;  LMA with local Special Needs:  [place]  [employer] Needs to be 2nd patient per office  . LAPAROSCOPIC RIGHT COLECTOMY Right 08/27/2017   Procedure: LAPAROSCOPIC RIGHT COLECTOMY;  Surgeon: Robert Bellow, MD;  Location: [facility];  Service: General;  Laterality: Right;  . POLYPECTOMY  08/05/2017   Procedure: POLYPECTOMY;  Surgeon: Lucilla Lame, MD;  Location: [place];  Service: Endoscopy;;  . POLYPECTOMY  01/06/2018   Procedure: POLYPECTOMY INTESTINAL;  Surgeon: Lucilla Lame, MD;  Location: [place];  Service: Endoscopy;;  . TALC PLEURODESIS  07/2014   left side  . THORACOSCOPY  07/2014   left side  . TUBAL LIGATION    . [place], with small bowel resection  . WEIL OSTEOTOMY Right 08/06/2016   Procedure: WEIL right 2nd & 3rd;  Surgeon: Albertine Patricia, DPM;  Location: [place];  Service: Podiatry;  Laterality: Right;    SOCIAL HISTORY: Social History   Socioeconomic History  . Marital status: Divorced    Spouse name: Not on file  . Number of children: Not on file  . Years of education: GED, LPN, pharmacy tech   . Highest education level: Associate degree: occupational, Hotel manager, or vocational program  Occupational History  . Occupation: retired    Comment: Forensic scientist Rep - stocking\packing  Tobacco Use  . Smoking status: Former Smoker    Packs/day: 1.50    Years: 53.00    Pack years: 79.50    Types: Cigarettes    Quit date: 08/22/2014    Years since quitting: 4.9  . Smokeless tobacco: Never Used  Substance and Sexual Activity  . Alcohol use: Not Currently    Alcohol/week: 0.0 standard drinks    Comment: occasional - 2-3x a year   .  Drug use: No  . Sexual activity: Never  Other Topics Concern  . Not on file  Social History Narrative   Currently at [facility].  Walks by herself.  History of smoking.  No alcohol.    Social Determinants of Health   Financial Resource Strain:   . Difficulty of Paying Living Expenses: Not on file  Food Insecurity:   . Worried About Charity fundraiser in the Last Year: Not on file  . Ran Out of Food in the Last Year: Not on file  Transportation Needs:   . Lack of Transportation (Medical): Not on file  . Lack of Transportation (Non-Medical): Not on file  Physical Activity: Insufficiently Active  . Days of Exercise per Week: 3 days  . Minutes of  Exercise per Session: 30 min  Stress:   . Feeling of Stress : Not on file  Social Connections:   . Frequency of Communication with Friends and Family: Not on file  . Frequency of Social Gatherings with Friends and Family: Not on file  . Attends Religious Services: Not on file  . Active Member of Clubs or Organizations: Not on file  . Attends Archivist Meetings: Not on file  . Marital Status: Not on file  Intimate Partner Violence:   . Fear of Current or Ex-Partner: Not on file  . Emotionally Abused: Not on file  . Physically Abused: Not on file  . Sexually Abused: Not on file    FAMILY HISTORY: Family History  Problem Relation Age of Onset  . Hypertension Mother   . Hypothyroidism Mother   . Alcohol abuse Mother   . Breast cancer Mother 17  . Hyperlipidemia Mother   . Mental illness Mother   . Heart attack Father   . Heart disease Father   . Glaucoma Father   . Colon cancer Father 32  . Diabetes Father   . Hypertension Father   . Heart disease Sister   . Hyperlipidemia Sister   . Hypertension Sister   . Lung disease Sister     ALLERGIES:  is allergic to nsaids.  MEDICATIONS:  Current Outpatient Medications  Medication Sig Dispense Refill  . acetaminophen (TYLENOL) 325 MG tablet Take 650 mg by mouth every 6 (six) hours as needed.    Marland Kitchen acyclovir (ZOVIRAX) 400 MG tablet Take 400 mg by mouth 5 (five) times daily. One a day    . albuterol (VENTOLIN HFA) 108 (90 Base) MCG/ACT inhaler Inhale 2 puffs into the lungs every 6 (six) hours as needed for wheezing or shortness of breath. 18 g 11  . amLODipine (NORVASC) 10 MG tablet Take 1 tablet by mouth daily.    Marland Kitchen aspirin EC 81 MG tablet Take 81 mg by mouth daily.    . calcium carbonate (OSCAL) 1500 (600 Ca) MG TABS tablet Take by mouth 2 (two) times daily with a meal.    . donepezil (ARICEPT) 5 MG tablet Take 1 tablet by mouth daily.    . Glucosamine-Chondroit-Vit C-Mn (GLUCOSAMINE-CHONDROITIN) TABS Take by mouth.     . levothyroxine (SYNTHROID, LEVOTHROID) 100 MCG tablet Take 1 tablet (100 mcg total) by mouth daily. 90 tablet 3  . lisinopril (ZESTRIL) 5 MG tablet Take 1 tablet (5 mg total) by mouth daily. 90 tablet 1  . Melatonin 5 MG TABS Take 10 mg by mouth at bedtime as needed (for sleep.).     Marland Kitchen mercaptopurine (PURINETHOL) 50 MG tablet TAKE 1 AND 1/2 TABLETS EVERY DAY 135 tablet  3  . mirtazapine (REMERON) 7.5 MG tablet Take 7.5 mg by mouth at bedtime.    . Multiple Vitamins-Minerals (MULTIVITAMIN WITH MINERALS) tablet Take 1 tablet by mouth daily. One-A-Day for 50+    . pantoprazole (PROTONIX) 40 MG tablet TAKE 1 TABLET BY MOUTH TWICE A DAY 180 tablet 2  . Polyethyl Glycol-Propyl Glycol (SYSTANE) 0.4-0.3 % SOLN Apply to eye.    . sertraline (ZOLOFT) 100 MG tablet Take 50 mg by mouth daily.    . traMADol (ULTRAM) 50 MG tablet Take 50 mg by mouth every 6 (six) hours as needed.    . vitamin B-12 (CYANOCOBALAMIN) 1000 MCG tablet Take 1,000 mcg by mouth daily.     No current facility-administered medications for this visit.      Marland Kitchen  PHYSICAL EXAMINATION: Vitals:   07/26/19 1309  BP: 120/72  Pulse: 98  Temp: (!) 97 F (36.1 C)   Filed Weights    Physical Exam  Constitutional: She is oriented to person, place, and time and well-developed, well-nourished, and in no distress.  In a wheelchair.  Accompanied by daughter.  HENT:  Head: Normocephalic and atraumatic.  Mouth/Throat: Oropharynx is clear and moist. No oropharyngeal exudate.  Eyes: Pupils are equal, round, and reactive to light.  Cardiovascular: Normal rate and regular rhythm.  Pulmonary/Chest: Effort normal and breath sounds normal. No respiratory distress. She has no wheezes.  Abdominal: Soft. Bowel sounds are normal. She exhibits no distension and no mass. There is no abdominal tenderness. There is no rebound and no guarding.  No significant tenderness  Musculoskeletal:        General: No tenderness or edema. Normal range of  motion.     Cervical back: Normal range of motion and neck supple.  Neurological: She is alert and oriented to person, place, and time.  Skin: Skin is warm.  Psychiatric: Affect normal.     LABORATORY DATA:  I have reviewed the data as listed Lab Results  Component Value Date   WBC 7.7 07/21/2019   HGB 13.8 07/21/2019   HCT 41.5 07/21/2019   MCV 103.8 (H) 07/21/2019   PLT 414 (H) 07/21/2019   Recent Labs    01/30/19 1428 01/30/19 1428 02/21/19 1550 06/05/19 1726 06/20/19 1133  NA 142  --  143  --  139  K 4.6  --  4.7  --  4.0  CL 101  --  101  --  103  CO2 23  --  26  --  25  GLUCOSE 88  --  93  --  119*  BUN 15  --  16  --  14  CREATININE 0.73   < > 0.80 0.90 0.78  CALCIUM 9.5  --  10.4*  --  9.8  GFRNONAA 84  --  75  --  >60  GFRAA 96  --  86  --  >60  PROT 6.8  --  6.6  --  7.9  ALBUMIN 4.2  --  3.9  --  4.1  AST 35  --  34  --  26  ALT 49*  --  53*  --  22  ALKPHOS 64  --  58  --  66  BILITOT 0.3  --  0.3  --  0.7   < > = values in this interval not displayed.    RADIOGRAPHIC STUDIES: I have personally reviewed the radiological images as listed and agreed with the findings in the report. MR LIVER W WO CONTRAST  Result  Date: 07/13/2019 CLINICAL DATA:  History of colon cancer. Subtle hypermetabolic liver lesions on recent PET-CT. EXAM: MRI ABDOMEN WITHOUT AND WITH CONTRAST TECHNIQUE: Multiplanar multisequence MR imaging of the abdomen was performed both before and after the administration of intravenous contrast. CONTRAST:  14m GADAVIST GADOBUTROL 1 MMOL/ML IV SOLN COMPARISON:  PET-CT 07/03/2019 FINDINGS: Lower chest: UNREMARKABLE. Hepatobiliary: 2.5 cm T1 hypointense, T2 hyperintense rim enhancing lesion is identified in the extreme dome of liver. This lesion restricts diffusion and corresponds to the hypermetabolic focus noted on the recent PET-CT. 3.6 x 3.2 cm T1 hypointense, minimally T2 hyperintense lesion is identified in the inferior tip of the right liver.  This shows heterogeneous enhancement after IV contrast administration with a slight rim predominant pattern. This lesion also restricts diffusion and was seen to be hypermetabolic on the recent PET-CT. Tiny 3-4 mm cyst noted medial right liver on T2 axial 11 of series 5. Layering sludge noted in the lumen of the gallbladder No intrahepatic or extrahepatic biliary dilation. Pancreas: No focal mass lesion. No dilatation of the main duct. No intraparenchymal cyst. No peripancreatic edema. Spleen:  No splenomegaly. No focal mass lesion. Adrenals/Urinary Tract: No adrenal nodule or mass. Kidneys unremarkable. Stomach/Bowel: Tiny hiatal hernia. Stomach otherwise unremarkable. Duodenum is normally positioned as is the ligament of Treitz. No small bowel or colonic dilatation within the visualized abdomen. Vascular/Lymphatic: No abdominal aortic aneurysm. No abdominal lymphadenopathy. Other:  No intraperitoneal free fluid. Musculoskeletal: Small midline ventral hernia contains a short segment of transverse colon without complicating features. No abnormal marrow enhancement within the visualized bony anatomy. IMPRESSION: 1. 2.5 cm rim enhancing lesion in the hepatic dome and 3.6 cm lesion in the inferior tip of the right liver correspond to the areas of hypermetabolism seen on the recent PET-CT and are consistent with metastatic disease. No other soft tissue metastases noted in the abdomen. 2. Tiny hiatal hernia. Electronically Signed   By: EMisty StanleyM.D.   On: 07/13/2019 10:32   NM PET Image Initial (PI) Skull Base To Thigh  Result Date: 07/03/2019 CLINICAL DATA:  Subsequent treatment strategy for colon cancer. C7 osseous lesion. EXAM: NUCLEAR MEDICINE PET SKULL BASE TO THIGH TECHNIQUE: 8.9 mCi F-18 FDG was injected intravenously. Full-ring PET imaging was performed from the skull base to thigh after the radiotracer. CT data was obtained and used for attenuation correction and anatomic localization. Fasting blood  glucose: 81 mg/dl COMPARISON:  MRI cervical spine dated 06/05/2019. Low-dose lung cancer screening CT chest dated 12/20/2018. FINDINGS: Mediastinal blood pool activity: SUV max 2.7 Liver activity: SUV max NA NECK: No hypermetabolic cervical lymphadenopathy. Incidental CT findings: none CHEST: Multifocal pleural-based hypermetabolism in the left hemithorax, including at the left lung apex. Representative max SUV 17.2 at the left lung apex. Underlying calcified pleural base lesions. This appearance favors prior talc pleurodesis. No suspicious pulmonary nodules. No hypermetabolic thoracic lymphadenopathy. Incidental CT findings: Atherosclerotic calcifications of the aortic root/arch. Mild coronary atherosclerosis of the LAD. ABDOMEN/PELVIS: Focal hypermetabolism at the right hepatic dome, max SUV 6.8, with suspected underlying 1.8 cm lesion (series 4/image 110). Additional focal hypermetabolism posteriorly within segment 6, max SUV 12.9, with suspected underlying lesion measuring at least 2.8 cm (series 4/image 154). These findings are suspicious for hepatic metastases. No abnormal hypermetabolism in the spleen, pancreas, or adrenal glands. No hypermetabolic abdominopelvic lymphadenopathy. Status post right hemicolectomy. Incidental CT findings: Tiny hiatal hernia. Atherosclerotic calcifications the abdominal aorta and branch vessels. 4 mm nonobstructing left lower pole renal calculus (series 4/image 145). Mild  diastasis of the midline anterior abdominal wall. Status post hysterectomy. SKELETON: No focal hypermetabolic activity to suggest skeletal metastasis. Specifically, no focal hypermetabolism at C7 to correspond to the suspected MRI abnormality. Incidental CT findings: Mild degenerative changes of the visualized thoracolumbar spine. Lumbar levoscoliosis. IMPRESSION: Status post right hemicolectomy. Two suspected hepatic metastases in the right hepatic lobe, as above. Consider MRI abdomen with/without contrast for  further evaluation, as clinically warranted. Correlate with CEA. No findings suspicious for osseous metastases. Specifically, no focal hypermetabolism at C7 to correspond to the suspected MRI abnormality. Multifocal pleural-based hypermetabolism in the left hemithorax, most prominent at the left lung apex, with associated calcified pleural plaques. This appearance favors sequela of prior talc pleurodesis. Electronically Signed   By: Julian Hy M.D.   On: 07/03/2019 12:58   US Biopsy-Liver  Result Date: 07/21/2019 INDICATION: History of carcinoma of the ascending colon with development of 2 new masses in the liver suspicious for metastatic disease. EXAM: ULTRASOUND GUIDED CORE BIOPSY OF LIVER MEDICATIONS: None. ANESTHESIA/SEDATION: Fentanyl 100 mcg IV; Versed 2.0 mg IV Moderate Sedation Time:  24 minutes. The patient was continuously monitored during the procedure by the interventional radiology nurse under my direct supervision. PROCEDURE: The procedure, risks, benefits, and alternatives were explained to the patient and her daughter. Questions regarding the procedure were encouraged and answered. Both the patient and daughter understand and consent was obtained from the daughter as the patient has a history of dementia. A time-out was performed prior to initiating the procedure. Initial ultrasound was performed to localize an inferior right hepatic mass. The abdominal wall was prepped with chlorhexidine in a sterile fashion, and a sterile drape was applied covering the operative field. A sterile gown and sterile gloves were used for the procedure. Local anesthesia was provided with 1% Lidocaine. Under ultrasound guidance, a 17 gauge trocar needle was advanced to the margin of a mass within the inferior aspect of the right lobe of the liver. After confirming needle tip position, 3 separate 18 gauge core biopsy samples were obtained. Material was submitted in formalin. Gel-Foam pledgets were advanced  through the outer needle as it was retracted. Additional ultrasound was performed. COMPLICATIONS: None immediate. FINDINGS: Rounded mass in the inferior aspect of the right lobe of the liver measures approximately 4.2 x 3.6 x 3.9 cm. Solid tissue was obtained. IMPRESSION: Ultrasound-guided core biopsy performed of a mass in the inferior aspect of the right lobe of the liver measuring 4.2 cm in greatest diameter by ultrasound. Electronically Signed   By: Aletta Edouard M.D.   On: 07/21/2019 13:34    ASSESSMENT & PLAN:   Primary adenocarcinoma of ascending colon ([place]) #Recurrent/stage IV colon cancer.  Discussed is rather unfortunate that patient's prior stage I colon cancer is currently recurrent in the liver-s/p biopsy-adenocarcinoma; elevated CEA. Send for NGS.   #I discussed the stage and pathology with the patient and family in detail.  Treatment would be palliative not curative.   #Discussed the treatment options include systemic therapy with FOLFOX; I discussed that FOLFOX chemotherapy is given every 2 weeks; discuss the potential side effects including but not limited to nausea vomiting diarrhea, sores in the mouth, hand-foot syndrome; also tingling and numbness/cold sensitivity with oxaliplatin.  However suspect patient will have poor tolerance to oxaliplatin based therapy; I would recommend 5-FU continuous infusion every 2 weeks.  Also discussed option of Xeloda/pill as alternative to infusion.  #Also discussed the option of possible liver directed local therapies with interventional radiology.  Patient  is not candidate for resection.  However all treatments will be considered palliative.  Will review tumor conference on 1/28.   #After extensive discussion about the multiple options-patient seems to be reluctant with any therapies.  Discussed that median survival is around 24 months.  However, without treatment survival is in the order of few months.  [facility]all[facility] care[facility].  No decisions made yet.  We will proceed with palliative care evaluation in 2 weeks.  #Abdominal pain-likely secondary to progressive malignancy recommend tramadol/Tylenol as needed.  #Vascular dementia hydrocephalus followed by neurology; stable.  #Chronic back pain-arthritis-stable  # # Palliative care evaluation: Introduced palliative care philosophy and services. I discussed the need for palliative care evaluation/symptom management to help quality of life in the context of incurable disease.  Patient is interested; will make referral.  # DISPOSITION: # Palliative care referral- Josh in 2 weeks # Follow up 2 weeks-MD;  Dr.B   # I reviewed the blood work- with the patient in detail; also reviewed the imaging independently [as summarized above]; and with the patient in detail.    Ml: Dr.Shah/Konz/Dr.Bronsetin/ Dr.Johnson  All questions were answered. The patient knows to call the clinic with any problems, questions or concerns.    Cammie Sickle, MD 07/27/2019 8:17 AM

## 2019-07-27 ENCOUNTER — Telehealth: Payer: Self-pay | Admitting: Internal Medicine

## 2019-07-27 ENCOUNTER — Other Ambulatory Visit: Payer: Medicare Other

## 2019-07-27 DIAGNOSIS — Z9181 History of falling: Secondary | ICD-10-CM | POA: Diagnosis not present

## 2019-07-27 DIAGNOSIS — U071 COVID-19: Secondary | ICD-10-CM | POA: Diagnosis not present

## 2019-07-27 DIAGNOSIS — I10 Essential (primary) hypertension: Secondary | ICD-10-CM | POA: Diagnosis not present

## 2019-07-27 DIAGNOSIS — C189 Malignant neoplasm of colon, unspecified: Secondary | ICD-10-CM | POA: Diagnosis not present

## 2019-07-27 DIAGNOSIS — I619 Nontraumatic intracerebral hemorrhage, unspecified: Secondary | ICD-10-CM | POA: Diagnosis not present

## 2019-07-27 NOTE — Telephone Encounter (Signed)
Form Pending md signature

## 2019-07-27 NOTE — Progress Notes (Signed)
Tumor Board Documentation  Joanna Rodriguez was presented by Dr Rogue Bussing at our Tumor Board on 07/27/2019, which included representatives from medical oncology, radiation oncology, navigation, pathology, radiology, surgical, surgical oncology, internal medicine, genetics, pulmonology, palliative care, research.  Joanna Rodriguez currently presents as a current patient, for Joanna Rodriguez, for new positive pathology with history of the following treatments: active survellience, surgical intervention(s).  Additionally, we reviewed previous medical and familial history, history of present illness, and recent lab results along with all available histopathologic and imaging studies. The tumor board considered available treatment options and made the following recommendations:   Treatment versus best supportive care  The following procedures/referrals were also placed: No orders of the defined types were placed in this encounter.   Clinical Trial Status: not discussed   Staging used: Pathologic Stage  AJCC Staging: T: x N: x M: 1 Group: Stage 4 Metastatic Colon cancer   National site-specific guidelines   were discussed with respect to the case.  Tumor board is a meeting of clinicians from various specialty areas who evaluate and discuss patients for whom a multidisciplinary approach is being considered. Final determinations in the plan of care are those of the provider(s). The responsibility for follow up of recommendations given during tumor board is that of the provider.   Today's extended care, comprehensive team conference, Joanna Rodriguez was not present for the discussion and was not examined.   Multidisciplinary Tumor Board is a multidisciplinary case peer review process.  Decisions discussed in the Multidisciplinary Tumor Board reflect the opinions of the specialists present at the conference without having examined the patient.  Ultimately, treatment and diagnostic decisions rest with the primary  provider(s) and the patient.

## 2019-07-27 NOTE — Telephone Encounter (Signed)
Please order [facility] One on pt most recent liver Biopsy.  GB

## 2019-08-01 DIAGNOSIS — Z1159 Encounter for screening for other viral diseases: Secondary | ICD-10-CM | POA: Diagnosis not present

## 2019-08-02 ENCOUNTER — Telehealth: Payer: Self-pay | Admitting: Internal Medicine

## 2019-08-02 NOTE — Telephone Encounter (Signed)
2/2-spoke to patient daughter-regarding the option of Y 90-if patient chooses to decline systemic chemotherapy.  Understands local therapy-not curable; temporizing only.  As per the daughter-patient not too keen on any therapies at this time.  However no decisions made.  Await follow-up appointment to discuss further.

## 2019-08-04 ENCOUNTER — Encounter: Payer: Self-pay | Admitting: Internal Medicine

## 2019-08-07 ENCOUNTER — Encounter: Payer: Self-pay | Admitting: Internal Medicine

## 2019-08-08 ENCOUNTER — Encounter: Payer: Self-pay | Admitting: Internal Medicine

## 2019-08-08 ENCOUNTER — Other Ambulatory Visit: Payer: Self-pay

## 2019-08-08 DIAGNOSIS — Z1159 Encounter for screening for other viral diseases: Secondary | ICD-10-CM | POA: Diagnosis not present

## 2019-08-09 ENCOUNTER — Other Ambulatory Visit: Payer: Self-pay

## 2019-08-09 ENCOUNTER — Inpatient Hospital Stay: Payer: Medicare Other | Attending: Internal Medicine | Admitting: Internal Medicine

## 2019-08-09 ENCOUNTER — Inpatient Hospital Stay (HOSPITAL_BASED_OUTPATIENT_CLINIC_OR_DEPARTMENT_OTHER): Payer: Medicare Other | Admitting: Hospice and Palliative Medicine

## 2019-08-09 DIAGNOSIS — I10 Essential (primary) hypertension: Secondary | ICD-10-CM | POA: Diagnosis not present

## 2019-08-09 DIAGNOSIS — Z87442 Personal history of urinary calculi: Secondary | ICD-10-CM | POA: Diagnosis not present

## 2019-08-09 DIAGNOSIS — J449 Chronic obstructive pulmonary disease, unspecified: Secondary | ICD-10-CM | POA: Diagnosis not present

## 2019-08-09 DIAGNOSIS — Z8719 Personal history of other diseases of the digestive system: Secondary | ICD-10-CM | POA: Insufficient documentation

## 2019-08-09 DIAGNOSIS — Z7982 Long term (current) use of aspirin: Secondary | ICD-10-CM | POA: Insufficient documentation

## 2019-08-09 DIAGNOSIS — K769 Liver disease, unspecified: Secondary | ICD-10-CM | POA: Diagnosis not present

## 2019-08-09 DIAGNOSIS — E039 Hypothyroidism, unspecified: Secondary | ICD-10-CM | POA: Insufficient documentation

## 2019-08-09 DIAGNOSIS — G8929 Other chronic pain: Secondary | ICD-10-CM | POA: Insufficient documentation

## 2019-08-09 DIAGNOSIS — F0150 Vascular dementia without behavioral disturbance: Secondary | ICD-10-CM | POA: Insufficient documentation

## 2019-08-09 DIAGNOSIS — G893 Neoplasm related pain (acute) (chronic): Secondary | ICD-10-CM | POA: Diagnosis not present

## 2019-08-09 DIAGNOSIS — Z515 Encounter for palliative care: Secondary | ICD-10-CM

## 2019-08-09 DIAGNOSIS — Z7189 Other specified counseling: Secondary | ICD-10-CM

## 2019-08-09 DIAGNOSIS — C182 Malignant neoplasm of ascending colon: Secondary | ICD-10-CM

## 2019-08-09 DIAGNOSIS — Z66 Do not resuscitate: Secondary | ICD-10-CM | POA: Insufficient documentation

## 2019-08-09 DIAGNOSIS — Z87891 Personal history of nicotine dependence: Secondary | ICD-10-CM | POA: Insufficient documentation

## 2019-08-09 DIAGNOSIS — M542 Cervicalgia: Secondary | ICD-10-CM | POA: Insufficient documentation

## 2019-08-09 DIAGNOSIS — R630 Anorexia: Secondary | ICD-10-CM | POA: Diagnosis not present

## 2019-08-09 DIAGNOSIS — M129 Arthropathy, unspecified: Secondary | ICD-10-CM | POA: Diagnosis not present

## 2019-08-09 DIAGNOSIS — Z79899 Other long term (current) drug therapy: Secondary | ICD-10-CM | POA: Diagnosis not present

## 2019-08-09 DIAGNOSIS — M8580 Other specified disorders of bone density and structure, unspecified site: Secondary | ICD-10-CM | POA: Diagnosis not present

## 2019-08-09 DIAGNOSIS — Z86010 Personal history of colonic polyps: Secondary | ICD-10-CM | POA: Insufficient documentation

## 2019-08-09 DIAGNOSIS — K5090 Crohn's disease, unspecified, without complications: Secondary | ICD-10-CM | POA: Diagnosis not present

## 2019-08-09 DIAGNOSIS — M549 Dorsalgia, unspecified: Secondary | ICD-10-CM | POA: Insufficient documentation

## 2019-08-09 DIAGNOSIS — R634 Abnormal weight loss: Secondary | ICD-10-CM | POA: Insufficient documentation

## 2019-08-09 MED ORDER — TRAMADOL HCL 50 MG PO TABS: 50.0000 mg | ORAL_TABLET | Freq: Two times a day (BID) | ORAL | 2 refills | Status: AC

## 2019-08-09 MED ORDER — DEXAMETHASONE 2 MG PO TABS: 2.0000 mg | ORAL_TABLET | Freq: Every day | ORAL | 2 refills | Status: AC

## 2019-08-09 NOTE — Progress Notes (Signed)
[facility]  [phone] [phone]   Name: Joanna Rodriguez Date: 08/09/2019 MRN: [id number]  DOB: 06/06/1951  Patient Care Team: Valerie Roys, DO as PCP - General (Family Medicine) Lucilla Lame, MD as Consulting Physician (Gastroenterology) Wilhelmina Mcardle, MD (Inactive) as Consulting Physician (Pulmonary Disease) Bernell List, CPhT as [facility] (Pharmacy Technician)    REASON FOR CONSULTATION: Joanna Rodriguez is a 69 y.o. female with multiple medical problems including stage IV recurrent colon cancer.  PMH also notable for COPD, not on O2, chronic back pain, history of CVA, and vascular dementia.  Patient was initially diagnosed with stage I colorectal cancer and underwent surgical resection March 2019.  She was found on MRI in January 2021 to have liver lesions.  Patient underwent ultrasound-guided biopsy with findings consistent with recurrent adenocarcinoma of the colon.  Patient was seen by medical oncology and has decided to forego future work-up or treatment.  She is not interested in like prolonging measures.  She was referred to palliative care to address goals and manage ongoing symptoms.  SOCIAL HISTORY:     reports that she quit smoking about 4 years ago. Her smoking use included cigarettes. She has a 79.50 pack-year smoking history. She has never used smokeless tobacco. She reports previous alcohol use. She reports that she does not use drugs.   Patient is not married.  She has 3 children.  Patient is current resident at [employer].  She was formally a Marine scientist.  ADVANCE DIRECTIVES:  On file  CODE STATUS: DNR (MOST form completed on 08/09/2019)  PAST MEDICAL HISTORY: Past Medical History:  Diagnosis Date  . Arthritis   . Cancer [facility])    face  . Complication of anesthesia    all anesthesia events in 2019 have worsened vertigo issues  . COPD  (chronic obstructive pulmonary disease) ([place])   . Crohn's disease ([place])    1989 ?  Marland Kitchen Dyspnea on exertion   . Gastritis   . Genital herpes   . H/O pneumothorax    x 2  . History of kidney stones   . Hypertension   . Hypothyroid   . Intestinal metaplasia of gastric mucosa   . Kidney stones   . Osteopenia   . Personal history of tobacco use, presenting hazards to health 10/23/2015  . Polyp of sigmoid colon   . Scoliosis   . Spontaneous pneumothorax   . Trouble swallowing   . Vertigo       . Volvulus ([place])     PAST SURGICAL HISTORY:  Past Surgical History:  Procedure Laterality Date  . ABDOMINAL HYSTERECTOMY    . APPENDECTOMY    . BREAST EXCISIONAL BIOPSY Bilateral 1990's   NEG  . CAPSULOTOMY METATARSOPHALANGEAL Left 03/19/2016   Procedure: CAPSULOTOMY METATARSOPHALANGEAL;  Surgeon: Albertine Patricia, DPM;  Location: [place];  Service: Podiatry;  Laterality: Left;  Second left toe.  Marland Kitchen COLONOSCOPY WITH PROPOFOL N/A 08/05/2017   Procedure: COLONOSCOPY WITH PROPOFOL;  Surgeon: Lucilla Lame, MD;  Location: [place];  Service: Endoscopy;  Laterality: N/A;  . COLONOSCOPY WITH PROPOFOL N/A 01/06/2018   Procedure: COLONOSCOPY WITH PROPOFOL;  Surgeon: Lucilla Lame, MD;  Location: [place];  Service: Endoscopy;  Laterality: N/A;  . ESOPHAGOGASTRODUODENOSCOPY (EGD) WITH PROPOFOL N/A 11/21/2015   Procedure: ESOPHAGOGASTRODUODENOSCOPY (EGD) WITH PROPOFOL with dialation;  Surgeon: Lucilla Lame, MD;  Location: [place];  Service: Endoscopy;  Laterality: N/A;  .  ESOPHAGOGASTRODUODENOSCOPY (EGD) WITH PROPOFOL N/A 01/06/2018   Procedure: ESOPHAGOGASTRODUODENOSCOPY (EGD) WITH biopsy.;  Surgeon: Lucilla Lame, MD;  Location: [place];  Service: Endoscopy;  Laterality: N/A;  . ESOPHAGOGASTRODUODENOSCOPY (EGD) WITH PROPOFOL N/A 03/03/2018   Procedure: ESOPHAGOGASTRODUODENOSCOPY (EGD) WITH PROPOFOL with biopsies;  Surgeon: Lucilla Lame, MD;  Location: [place];  Service: Endoscopy;  Laterality: N/A;  . FLEXOR TENDON REPAIR Left 03/19/2016   Procedure: FLEXOR TENDON release, percutaneous.;  Surgeon: Albertine Patricia, DPM;  Location: [place];  Service: Podiatry;  Laterality: Left;  4th left toe.  Marland Kitchen HAMMER TOE SURGERY Left 03/19/2016   Procedure: HAMMER TOE CORRECTION with PIPjoint fusion;  Surgeon: Albertine Patricia, DPM;  Location: [place];  Service: Podiatry;  Laterality: Left;  Third left toe.  Marland Kitchen HAMMER TOE SURGERY Right 08/06/2016   Procedure: HAMMER TOE CORRECTION  RIGHT 2ND AND 3RD;  Surgeon: Albertine Patricia, DPM;  Location: [place];  Service: Podiatry;  Laterality: Right;  LMA with local Special Needs:  [place]  [employer] Needs to be 2nd patient per office  . LAPAROSCOPIC RIGHT COLECTOMY Right 08/27/2017   Procedure: LAPAROSCOPIC RIGHT COLECTOMY;  Surgeon: Robert Bellow, MD;  Location: [facility];  Service: General;  Laterality: Right;  . POLYPECTOMY  08/05/2017   Procedure: POLYPECTOMY;  Surgeon: Lucilla Lame, MD;  Location: [place];  Service: Endoscopy;;  . POLYPECTOMY  01/06/2018   Procedure: POLYPECTOMY INTESTINAL;  Surgeon: Lucilla Lame, MD;  Location: [place];  Service: Endoscopy;;  . TALC PLEURODESIS  07/2014   left side  . THORACOSCOPY  07/2014   left side  . TUBAL LIGATION    . [place], with small bowel resection  . WEIL OSTEOTOMY Right 08/06/2016   Procedure: WEIL right 2nd & 3rd;  Surgeon: Albertine Patricia, DPM;  Location: [place];  Service: Podiatry;  Laterality: Right;    HEMATOLOGY/ONCOLOGY HISTORY:  Oncology History Overview Note  # VASCULAR DEMENTIA [Dr.Shah]- Indeterminate 9 mm somewhat rounded focus of edema signal and enhancement within the anterosuperior C6 vertebral body. Contrast-enhanced MRI follow-up is recommended to ensure stability, particularly given the provided history of colon cancer.  #  MARCH 2019- COLON  cancer [surgery ] pT2  pN0 - Stage I; MMR- STABLE.   #January 2021-MRI liver liver lesions; PET scan-liver lesion.  Ultrasound-guided biopsy-adenocarcinoma; colon primary.  #   # NGS/MOLECULAR TESTS:O  # PALLIATIVE CARE EVALUATION:p  # PAIN MANAGEMENT: na   DIAGNOSIS: Colon cancer  STAGE:  IV       ;  GOALS: Palliative  CURRENT/MOST RECENT THERAPY : p    Primary adenocarcinoma of ascending colon ([place])  09/02/2017 Initial Diagnosis   Primary adenocarcinoma of ascending colon ([facility])     ALLERGIES:  is allergic to nsaids.  MEDICATIONS:  Current Outpatient Medications  Medication Sig Dispense Refill  . acetaminophen (TYLENOL) 325 MG tablet Take 650 mg by mouth every 6 (six) hours as needed.    Marland Kitchen acyclovir (ZOVIRAX) 400 MG tablet Take 400 mg by mouth 5 (five) times daily. One a day    . albuterol (VENTOLIN HFA) 108 (90 Base) MCG/ACT inhaler Inhale 2 puffs into the lungs every 6 (six) hours as needed for wheezing or shortness of breath. 18 g 11  . amLODipine (NORVASC) 10 MG tablet Take 1 tablet by mouth daily.    Marland Kitchen aspirin EC 81 MG tablet Take 81 mg by mouth daily.    . calcium carbonate (OSCAL) 1500 (  600 Ca) MG TABS tablet Take by mouth 2 (two) times daily with a meal.    . dexamethasone (DECADRON) 2 MG tablet Take 1 tablet (2 mg total) by mouth daily. 30 tablet 2  . donepezil (ARICEPT) 5 MG tablet Take 1 tablet by mouth daily.    . Glucosamine-Chondroit-Vit C-Mn (GLUCOSAMINE-CHONDROITIN) TABS Take by mouth.    . levothyroxine (SYNTHROID, LEVOTHROID) 100 MCG tablet Take 1 tablet (100 mcg total) by mouth daily. 90 tablet 3  . lisinopril (ZESTRIL) 5 MG tablet Take 1 tablet (5 mg total) by mouth daily. 90 tablet 1  . Melatonin 5 MG TABS Take 10 mg by mouth at bedtime as needed (for sleep.).     Marland Kitchen mercaptopurine (PURINETHOL) 50 MG tablet TAKE 1 AND 1/2 TABLETS EVERY DAY 135 tablet 3  . mirtazapine (REMERON) 7.5 MG tablet Take 7.5 mg by mouth at bedtime.    . Multiple  Vitamins-Minerals (MULTIVITAMIN WITH MINERALS) tablet Take 1 tablet by mouth daily. One-A-Day for 50+    . pantoprazole (PROTONIX) 40 MG tablet TAKE 1 TABLET BY MOUTH TWICE A DAY 180 tablet 2  . Polyethyl Glycol-Propyl Glycol (SYSTANE) 0.4-0.3 % SOLN Apply to eye.    . sertraline (ZOLOFT) 100 MG tablet Take 50 mg by mouth daily.    . traMADol (ULTRAM) 50 MG tablet Take 1 tablet (50 mg total) by mouth 2 (two) times daily. May also take an additional 1 tablet (57m) by mouth every 6 hours as needed for breakthrough pain 90 tablet 2  . vitamin B-12 (CYANOCOBALAMIN) 1000 MCG tablet Take 1,000 mcg by mouth daily.     No current facility-administered medications for this visit.    VITAL SIGNS: LMP 06/03/1972  There were no vitals filed for this visit.  Estimated body mass index is 27.06 kg/m as calculated from the following:   Height as of 07/21/19: 4' 11"  (1.499 m).   Weight as of 08/08/19: 134 lb (60.8 kg).  LABS: CBC:    Component Value Date/Time   WBC 7.7 07/21/2019 1009   HGB 13.8 07/21/2019 1009   HGB 13.5 02/21/2019 1550   HCT 41.5 07/21/2019 1009   HCT 39.2 02/21/2019 1550   PLT 414 (H) 07/21/2019 1009   PLT 410 02/21/2019 1550   MCV 103.8 (H) 07/21/2019 1009   MCV 98 (H) 02/21/2019 1550   MCV 111 (H) 07/24/2014 2103   NEUTROABS 5.2 06/20/2019 1133   NEUTROABS 3.6 02/21/2019 1550   NEUTROABS 3.3 07/24/2014 2103   LYMPHSABS 1.9 06/20/2019 1133   LYMPHSABS 1.9 02/21/2019 1550   LYMPHSABS 1.8 07/24/2014 2103   MONOABS 0.5 06/20/2019 1133   MONOABS 0.4 07/24/2014 2103   EOSABS 0.1 06/20/2019 1133   EOSABS 0.0 02/21/2019 1550   EOSABS 0.0 07/24/2014 2103   BASOSABS 0.1 06/20/2019 1133   BASOSABS 0.1 02/21/2019 1550   BASOSABS 0.1 07/24/2014 2103   Comprehensive Metabolic Panel:    Component Value Date/Time   NA 139 06/20/2019 1133   NA 143 02/21/2019 1550   NA 142 07/24/2014 2103   K 4.0 06/20/2019 1133   K 3.6 07/24/2014 2103   CL 103 06/20/2019 1133   CL 105  07/24/2014 2103   CO2 25 06/20/2019 1133   CO2 27 07/24/2014 2103   BUN 14 06/20/2019 1133   BUN 16 02/21/2019 1550   BUN 9 07/24/2014 2103   CREATININE 0.78 06/20/2019 1133   CREATININE 0.71 07/24/2014 2103   GLUCOSE 119 (H) 06/20/2019 1133   GLUCOSE 83 07/24/2014  2103   CALCIUM 9.8 06/20/2019 1133   CALCIUM 9.3 07/24/2014 2103   AST 26 06/20/2019 1133   ALT 22 06/20/2019 1133   ALKPHOS 66 06/20/2019 1133   BILITOT 0.7 06/20/2019 1133   BILITOT 0.3 02/21/2019 1550   PROT 7.9 06/20/2019 1133   PROT 6.6 02/21/2019 1550   ALBUMIN 4.1 06/20/2019 1133   ALBUMIN 3.9 02/21/2019 1550    RADIOGRAPHIC STUDIES: MR LIVER W WO CONTRAST  Result Date: 07/13/2019 CLINICAL DATA:  History of colon cancer. Subtle hypermetabolic liver lesions on recent PET-CT. EXAM: MRI ABDOMEN WITHOUT AND WITH CONTRAST TECHNIQUE: Multiplanar multisequence MR imaging of the abdomen was performed both before and after the administration of intravenous contrast. CONTRAST:  35m GADAVIST GADOBUTROL 1 MMOL/ML IV SOLN COMPARISON:  PET-CT 07/03/2019 FINDINGS: Lower chest: UNREMARKABLE. Hepatobiliary: 2.5 cm T1 hypointense, T2 hyperintense rim enhancing lesion is identified in the extreme dome of liver. This lesion restricts diffusion and corresponds to the hypermetabolic focus noted on the recent PET-CT. 3.6 x 3.2 cm T1 hypointense, minimally T2 hyperintense lesion is identified in the inferior tip of the right liver. This shows heterogeneous enhancement after IV contrast administration with a slight rim predominant pattern. This lesion also restricts diffusion and was seen to be hypermetabolic on the recent PET-CT. Tiny 3-4 mm cyst noted medial right liver on T2 axial 11 of series 5. Layering sludge noted in the lumen of the gallbladder No intrahepatic or extrahepatic biliary dilation. Pancreas: No focal mass lesion. No dilatation of the main duct. No intraparenchymal cyst. No peripancreatic edema. Spleen:  No splenomegaly. No  focal mass lesion. Adrenals/Urinary Tract: No adrenal nodule or mass. Kidneys unremarkable. Stomach/Bowel: Tiny hiatal hernia. Stomach otherwise unremarkable. Duodenum is normally positioned as is the ligament of Treitz. No small bowel or colonic dilatation within the visualized abdomen. Vascular/Lymphatic: No abdominal aortic aneurysm. No abdominal lymphadenopathy. Other:  No intraperitoneal free fluid. Musculoskeletal: Small midline ventral hernia contains a short segment of transverse colon without complicating features. No abnormal marrow enhancement within the visualized bony anatomy. IMPRESSION: 1. 2.5 cm rim enhancing lesion in the hepatic dome and 3.6 cm lesion in the inferior tip of the right liver correspond to the areas of hypermetabolism seen on the recent PET-CT and are consistent with metastatic disease. No other soft tissue metastases noted in the abdomen. 2. Tiny hiatal hernia. Electronically Signed   By: EMisty StanleyM.D.   On: 07/13/2019 10:32   [place]  Result Date: 07/21/2019 INDICATION: History of carcinoma of the ascending colon with development of 2 new masses in the liver suspicious for metastatic disease. EXAM: ULTRASOUND GUIDED CORE BIOPSY OF LIVER MEDICATIONS: None. ANESTHESIA/SEDATION: Fentanyl 100 mcg IV; Versed 2.0 mg IV Moderate Sedation Time:  24 minutes. The patient was continuously monitored during the procedure by the interventional radiology nurse under my direct supervision. PROCEDURE: The procedure, risks, benefits, and alternatives were explained to the patient and her daughter. Questions regarding the procedure were encouraged and answered. Both the patient and daughter understand and consent was obtained from the daughter as the patient has a history of dementia. A time-out was performed prior to initiating the procedure. Initial ultrasound was performed to localize an inferior right hepatic mass. The abdominal wall was prepped with chlorhexidine in a sterile  fashion, and a sterile drape was applied covering the operative field. A sterile gown and sterile gloves were used for the procedure. Local anesthesia was provided with 1% Lidocaine. Under ultrasound guidance, a 17 gauge trocar needle was  advanced to the margin of a mass within the inferior aspect of the right lobe of the liver. After confirming needle tip position, 3 separate 18 gauge core biopsy samples were obtained. Material was submitted in formalin. Gel-Foam pledgets were advanced through the outer needle as it was retracted. Additional ultrasound was performed. COMPLICATIONS: None immediate. FINDINGS: Rounded mass in the inferior aspect of the right lobe of the liver measures approximately 4.2 x 3.6 x 3.9 cm. Solid tissue was obtained. IMPRESSION: Ultrasound-guided core biopsy performed of a mass in the inferior aspect of the right lobe of the liver measuring 4.2 cm in greatest diameter by ultrasound. Electronically Signed   By: Aletta Edouard M.D.   On: 07/21/2019 13:34    PERFORMANCE STATUS (ECOG) : 3 - Symptomatic, >50% confined to bed  Review of Systems Unless otherwise noted, a complete review of systems is negative.  Physical Exam General: NAD, frail appearing, thin Pulmonary: Unlabored Extremities: no edema, no joint deformities Skin: no rashes Neurological: Weakness but otherwise nonfocal  IMPRESSION: I met with patient and her daughter today in the clinic.  Introduced palliative care services and attempted establish therapeutic rapport.  Patient also met with Dr. Rogue Bussing today.  Treatment options have been discussed but patient has decided to forego any future work-up or treatment.  She says "I am not interested in prolonging things."  She has decided to just focus on comfort measures only and would be interested in pursuing hospice at the facility.  We did speak about what hospice would entail.  Patient would like to initiate hospice at the SNF now with plan for future  transfer to residential hospice when she is in the final weeks of life.  Symptomatically, patient is having right upper quadrant pain likely secondary to liver mets.  Symptoms concerning for capsular pain.  She also has history of chronic back pain for which she has taken tramadol chronically for many years.  Patient says that the tramadol is helping her abdominal pain but she is finding that she has not getting it regularly while at the nursing facility.  We did discuss scheduling her dosing of the tramadol.  I will also plan to start her on dexamethasone for capsular pain.  We discussed CODE STATUS.  Patient says that she is not interested in life prolonging measures such as CPR or intubation.  She is also not interested in hospitalization and only wants to focus on comfort.   I completed a MOST form today. The patient and family outlined their wishes for the following treatment decisions:  Cardiopulmonary Resuscitation: Do Not Attempt Resuscitation (DNR/No CPR)  Medical Interventions: Comfort Measures: Keep clean, warm, and dry. Use medication by any route, positioning, wound care, and other measures to relieve pain and suffering. Use oxygen, suction and manual treatment of airway obstruction as needed for comfort. Do not transfer to the hospital unless comfort needs cannot be met in current location.  Antibiotics: Determine use of limitation of antibiotics when infection occurs  IV Fluids: No IV fluids (provide other measures to ensure comfort)  Feeding Tube: No feeding tube   I attempted to reach out to the physician on record at the SNF -Dr. Juluis Pitch but was unable to reach him.  I did speak with a nurse at the facility and relayed to her our recommendations and orders.  Also spoke with hospice  PLAN: -Best supportive care -Hospice at SNF -Plan for Hospice Home at end of life per daughter's request -Schedule tramadol 74m BID.  May take an additional 49m Q6H as needed for  BTP -Start Dexamethasone 234mdaily -Prophylactic bowel regimen -DNR/DNI -MOST form completed -RTC as needed   Patient expressed understanding and was in agreement with this plan. She also understands that She can call the clinic at any time with any questions, concerns, or complaints.     Time Total: 45 minutes  Visit consisted of counseling and education dealing with the complex and emotionally intense issues of symptom management and palliative care in the setting of serious and potentially life-threatening illness.Greater than 50%  of this time was spent counseling and coordinating care related to the above assessment and plan.  Signed by: JoAltha HarmPhD, NP-C

## 2019-08-09 NOTE — Patient Outreach (Signed)
[facility]) Care Management  08/09/2019  Joanna Rodriguez April 12, 1951 [id number]   Medication Adherence call to Joanna Rodriguez Compliant Voice message left with a call back number. Joanna Rodriguez is showing past due on Lisinopril 40 mg under [place].   [facility] Management Direct Dial [phone]  Fax [phone] [email or web address]

## 2019-08-09 NOTE — Assessment & Plan Note (Addendum)
#  Recurrent/stage IV colon cancer.  Discussed is rather unfortunate that patient's prior stage I colon cancer is currently recurrent in the liver-s/p biopsy-adenocarcinoma; elevated CEA.  #I had a long discussion with the patient and her daughter regarding-the natural history of disease.  The median survival metastatic colon cancer on treatment is approximately 24 months.  Without treatment-I explained the survival in the order of few months.  #Discussed regarding palliative treatment options of rate embolization/chemotherapy.  Patient declines as she is interested in quality of life/symptom management.  #Abdominal pain-likely secondary to progressive malignancy recommend tramadol/Tylenol-overall stable.  #Vascular dementia hydrocephalus followed by neurology; stable  #Chronic back pain-arthritis-stable continue breakthrough pain medication.  # Palliative care evaluation: Met with Josh Borders today; recommend hospice at the facility.  I had a long discussion with the patient and her daughter-then agreement.  Discussed with [employer].  # DISPOSITION: #Follow-up with me as needed.

## 2019-08-09 NOTE — Progress Notes (Signed)
[facility] CONSULT NOTE  Patient Care Team: Valerie Roys, DO as PCP - General (Family Medicine) Lucilla Lame, MD as Consulting Physician (Gastroenterology) Wilhelmina Mcardle, MD (Inactive) as Consulting Physician (Pulmonary Disease)  CHIEF COMPLAINTS/PURPOSE OF CONSULTATION: Bone lesion  #  Oncology History Overview Note  # VASCULAR DEMENTIA [Dr.Shah]- Indeterminate 9 mm somewhat rounded focus of edema signal and enhancement within the anterosuperior C6 vertebral body. Contrast-enhanced MRI follow-up is recommended to ensure stability, particularly given the provided history of colon cancer.  #  MARCH 2019- COLON cancer [surgery ] pT2  pN0 - Stage I; MMR- STABLE.   #January 2021-MRI liver liver lesions; PET scan-liver lesion.  Ultrasound-guided biopsy-adenocarcinoma; colon primary.  #   # NGS/MOLECULAR TESTS:O  # PALLIATIVE CARE EVALUATION:p  # PAIN MANAGEMENT: na   DIAGNOSIS: Colon cancer  STAGE:  IV       ;  GOALS: Palliative  CURRENT/MOST RECENT THERAPY : p    Primary adenocarcinoma of ascending colon ([place])  09/02/2017 Initial Diagnosis   Primary adenocarcinoma of ascending colon ([place])      HISTORY OF PRESENTING ILLNESS:  Joanna Rodriguez 69 y.o.  female with a history of smoking; and also history of vascular dementia [currently at [facility]] prior history of colon cancer is here for follow-up to discuss recent imaging findings/biopsy.   Patient continues to complain of intermittent right upper quadrant pain/flank pain.  It is improved on tramadol.  Poor appetite.  Positive for weight loss.  Otherwise no headaches.  She continues to have chronic neck pain back pain.  She is quite limited in her mobility.  She continues to be in [facility].   Review of Systems  Constitutional: Positive for malaise/fatigue and weight loss. Negative for chills, diaphoresis and fever.  HENT: Negative for nosebleeds and sore throat.   Eyes: Negative for  double vision.  Respiratory: Negative for cough, hemoptysis, sputum production, shortness of breath and wheezing.   Cardiovascular: Negative for chest pain, palpitations, orthopnea and leg swelling.  Gastrointestinal: Positive for abdominal pain. Negative for blood in stool, constipation, diarrhea, heartburn, melena, nausea and vomiting.  Genitourinary: Negative for dysuria, frequency and urgency.  Musculoskeletal: Positive for back pain and joint pain.  Skin: Negative.  Negative for itching and rash.  Neurological: Positive for weakness. Negative for dizziness, tingling, focal weakness and headaches.  Endo/Heme/Allergies: Does not bruise/bleed easily.  Psychiatric/Behavioral: Negative for depression. The patient is not nervous/anxious and does not have insomnia.      MEDICAL HISTORY:  Past Medical History:  Diagnosis Date  . Arthritis   . Cancer [facility])    face  . Complication of anesthesia    all anesthesia events in 2019 have worsened vertigo issues  . COPD (chronic obstructive pulmonary disease) ([place])   . Crohn's disease ([place])    1989 ?  Marland Kitchen Dyspnea on exertion   . Gastritis   . Genital herpes   . H/O pneumothorax    x 2  . History of kidney stones   . Hypertension   . Hypothyroid   . Intestinal metaplasia of gastric mucosa   . Kidney stones   . Osteopenia   . Personal history of tobacco use, presenting hazards to health 10/23/2015  . Polyp of sigmoid colon   . Scoliosis   . Spontaneous pneumothorax   . Trouble swallowing   . Vertigo       . Volvulus ([place])     SURGICAL HISTORY: Past Surgical History:  Procedure Laterality  Date  . ABDOMINAL HYSTERECTOMY    . APPENDECTOMY    . BREAST EXCISIONAL BIOPSY Bilateral 1990's   NEG  . CAPSULOTOMY METATARSOPHALANGEAL Left 03/19/2016   Procedure: CAPSULOTOMY METATARSOPHALANGEAL;  Surgeon: Albertine Patricia, DPM;  Location: [place];  Service: Podiatry;  Laterality: Left;  Second left toe.  Marland Kitchen COLONOSCOPY WITH PROPOFOL  N/A 08/05/2017   Procedure: COLONOSCOPY WITH PROPOFOL;  Surgeon: Lucilla Lame, MD;  Location: [place];  Service: Endoscopy;  Laterality: N/A;  . COLONOSCOPY WITH PROPOFOL N/A 01/06/2018   Procedure: COLONOSCOPY WITH PROPOFOL;  Surgeon: Lucilla Lame, MD;  Location: [place];  Service: Endoscopy;  Laterality: N/A;  . ESOPHAGOGASTRODUODENOSCOPY (EGD) WITH PROPOFOL N/A 11/21/2015   Procedure: ESOPHAGOGASTRODUODENOSCOPY (EGD) WITH PROPOFOL with dialation;  Surgeon: Lucilla Lame, MD;  Location: [place];  Service: Endoscopy;  Laterality: N/A;  . ESOPHAGOGASTRODUODENOSCOPY (EGD) WITH PROPOFOL N/A 01/06/2018   Procedure: ESOPHAGOGASTRODUODENOSCOPY (EGD) WITH biopsy.;  Surgeon: Lucilla Lame, MD;  Location: [place];  Service: Endoscopy;  Laterality: N/A;  . ESOPHAGOGASTRODUODENOSCOPY (EGD) WITH PROPOFOL N/A 03/03/2018   Procedure: ESOPHAGOGASTRODUODENOSCOPY (EGD) WITH PROPOFOL with biopsies;  Surgeon: Lucilla Lame, MD;  Location: [place] [place];  Service: Endoscopy;  Laterality: N/A;  . FLEXOR TENDON REPAIR Left 03/19/2016   Procedure: FLEXOR TENDON release, percutaneous.;  Surgeon: Albertine Patricia, DPM;  Location: [facility];  Service: Podiatry;  Laterality: Left;  4th left toe.  Marland Kitchen HAMMER TOE SURGERY Left 03/19/2016   Procedure: HAMMER TOE CORRECTION with PIPjoint fusion;  Surgeon: Albertine Patricia, DPM;  Location: [place];  Service: Podiatry;  Laterality: Left;  Third left toe.  Marland Kitchen HAMMER TOE SURGERY Right 08/06/2016   Procedure: HAMMER TOE CORRECTION  RIGHT 2ND AND 3RD;  Surgeon: Albertine Patricia, DPM;  Location: [facility];  Service: Podiatry;  Laterality: Right;  LMA with local Special Needs:  [place]  [employer] Needs to be 2nd patient per office  . LAPAROSCOPIC RIGHT COLECTOMY Right 08/27/2017   Procedure: LAPAROSCOPIC RIGHT COLECTOMY;  Surgeon: Robert Bellow, MD;  Location: [facility];  Service: General;  Laterality: Right;  .  POLYPECTOMY  08/05/2017   Procedure: POLYPECTOMY;  Surgeon: Lucilla Lame, MD;  Location: [facility];  Service: Endoscopy;;  . POLYPECTOMY  01/06/2018   Procedure: POLYPECTOMY INTESTINAL;  Surgeon: Lucilla Lame, MD;  Location: [place];  Service: Endoscopy;;  . TALC PLEURODESIS  07/2014   left side  . THORACOSCOPY  07/2014   left side  . TUBAL LIGATION    . [place], with small bowel resection  . WEIL OSTEOTOMY Right 08/06/2016   Procedure: WEIL right 2nd & 3rd;  Surgeon: Albertine Patricia, DPM;  Location: [facility];  Service: Podiatry;  Laterality: Right;    SOCIAL HISTORY: Social History   Socioeconomic History  . Marital status: Divorced    Spouse name: Not on file  . Number of children: Not on file  . Years of education: GED, LPN, pharmacy tech   . Highest education level: Associate degree: occupational, Hotel manager, or vocational program  Occupational History  . Occupation: retired    Comment: Forensic scientist Rep - stocking\packing  Tobacco Use  . Smoking status: Former Smoker    Packs/day: 1.50    Years: 53.00    Pack years: 79.50    Types: Cigarettes    Quit date: 08/22/2014    Years since quitting: 4.9  . Smokeless tobacco: Never Used  Substance and Sexual Activity  .  Alcohol use: Not Currently    Alcohol/week: 0.0 standard drinks    Comment: occasional - 2-3x a year   . Drug use: No  . Sexual activity: Never  Other Topics Concern  . Not on file  Social History Narrative   Currently at [facility].  Walks by herself.  History of smoking.  No alcohol.    Social Determinants of Health   Financial Resource Strain:   . Difficulty of Paying Living Expenses: Not on file  Food Insecurity:   . Worried About Charity fundraiser in the Last Year: Not on file  . Ran Out of Food in the Last Year: Not on file  Transportation Needs:   . Lack of Transportation (Medical): Not on file  . Lack of Transportation (Non-Medical): Not  on file  Physical Activity: Insufficiently Active  . Days of Exercise per Week: 3 days  . Minutes of Exercise per Session: 30 min  Stress:   . Feeling of Stress : Not on file  Social Connections:   . Frequency of Communication with Friends and Family: Not on file  . Frequency of Social Gatherings with Friends and Family: Not on file  . Attends Religious Services: Not on file  . Active Member of Clubs or Organizations: Not on file  . Attends Archivist Meetings: Not on file  . Marital Status: Not on file  Intimate Partner Violence:   . Fear of Current or Ex-Partner: Not on file  . Emotionally Abused: Not on file  . Physically Abused: Not on file  . Sexually Abused: Not on file    FAMILY HISTORY: Family History  Problem Relation Age of Onset  . Hypertension Mother   . Hypothyroidism Mother   . Alcohol abuse Mother   . Breast cancer Mother 22  . Hyperlipidemia Mother   . Mental illness Mother   . Heart attack Father   . Heart disease Father   . Glaucoma Father   . Colon cancer Father 69  . Diabetes Father   . Hypertension Father   . Heart disease Sister   . Hyperlipidemia Sister   . Hypertension Sister   . Lung disease Sister     ALLERGIES:  is allergic to nsaids.  MEDICATIONS:  Current Outpatient Medications  Medication Sig Dispense Refill  . acetaminophen (TYLENOL) 325 MG tablet Take 650 mg by mouth every 6 (six) hours as needed.    Marland Kitchen acyclovir (ZOVIRAX) 400 MG tablet Take 400 mg by mouth 5 (five) times daily. One a day    . albuterol (VENTOLIN HFA) 108 (90 Base) MCG/ACT inhaler Inhale 2 puffs into the lungs every 6 (six) hours as needed for wheezing or shortness of breath. 18 g 11  . amLODipine (NORVASC) 10 MG tablet Take 1 tablet by mouth daily.    Marland Kitchen aspirin EC 81 MG tablet Take 81 mg by mouth daily.    . calcium carbonate (OSCAL) 1500 (600 Ca) MG TABS tablet Take by mouth 2 (two) times daily with a meal.    . donepezil (ARICEPT) 5 MG tablet Take 1  tablet by mouth daily.    . Glucosamine-Chondroit-Vit C-Mn (GLUCOSAMINE-CHONDROITIN) TABS Take by mouth.    . levothyroxine (SYNTHROID, LEVOTHROID) 100 MCG tablet Take 1 tablet (100 mcg total) by mouth daily. 90 tablet 3  . lisinopril (ZESTRIL) 5 MG tablet Take 1 tablet (5 mg total) by mouth daily. 90 tablet 1  . Melatonin 5 MG TABS Take 10 mg by mouth at bedtime  as needed (for sleep.).     Marland Kitchen mercaptopurine (PURINETHOL) 50 MG tablet TAKE 1 AND 1/2 TABLETS EVERY DAY 135 tablet 3  . mirtazapine (REMERON) 7.5 MG tablet Take 7.5 mg by mouth at bedtime.    . Multiple Vitamins-Minerals (MULTIVITAMIN WITH MINERALS) tablet Take 1 tablet by mouth daily. One-A-Day for 50+    . pantoprazole (PROTONIX) 40 MG tablet TAKE 1 TABLET BY MOUTH TWICE A DAY 180 tablet 2  . Polyethyl Glycol-Propyl Glycol (SYSTANE) 0.4-0.3 % SOLN Apply to eye.    . sertraline (ZOLOFT) 100 MG tablet Take 50 mg by mouth daily.    . vitamin B-12 (CYANOCOBALAMIN) 1000 MCG tablet Take 1,000 mcg by mouth daily.    Marland Kitchen dexamethasone (DECADRON) 2 MG tablet Take 1 tablet (2 mg total) by mouth daily. 30 tablet 2  . traMADol (ULTRAM) 50 MG tablet Take 1 tablet (50 mg total) by mouth 2 (two) times daily. May also take an additional 1 tablet (63m) by mouth every 6 hours as needed for breakthrough pain 90 tablet 2   No current facility-administered medications for this visit.      .Marland Kitchen PHYSICAL EXAMINATION: Vitals:   08/08/19 1500  BP: 97/60  Pulse: (!) 104  Temp: (!) 96.4 F (35.8 C)   Filed Weights   08/08/19 1500  Weight: 134 lb (60.8 kg)    Physical Exam  Constitutional: She is oriented to person, place, and time and well-developed, well-nourished, and in no distress.  In a wheelchair.  Accompanied by daughter.  HENT:  Head: Normocephalic and atraumatic.  Mouth/Throat: Oropharynx is clear and moist. No oropharyngeal exudate.  Eyes: Pupils are equal, round, and reactive to light.  Cardiovascular: Normal rate and regular rhythm.   Pulmonary/Chest: Effort normal and breath sounds normal. No respiratory distress. She has no wheezes.  Abdominal: Soft. Bowel sounds are normal. She exhibits no distension and no mass. There is no abdominal tenderness. There is no rebound and no guarding.  No significant tenderness  Musculoskeletal:        General: No tenderness or edema. Normal range of motion.     Cervical back: Normal range of motion and neck supple.  Neurological: She is alert and oriented to person, place, and time.  Skin: Skin is warm.  Psychiatric: Affect normal.     LABORATORY DATA:  I have reviewed the data as listed Lab Results  Component Value Date   WBC 7.7 07/21/2019   HGB 13.8 07/21/2019   HCT 41.5 07/21/2019   MCV 103.8 (H) 07/21/2019   PLT 414 (H) 07/21/2019   Recent Labs    01/30/19 1428 01/30/19 1428 02/21/19 1550 06/05/19 1726 06/20/19 1133  NA 142  --  143  --  139  K 4.6  --  4.7  --  4.0  CL 101  --  101  --  103  CO2 23  --  26  --  25  GLUCOSE 88  --  93  --  119*  BUN 15  --  16  --  14  CREATININE 0.73   < > 0.80 0.90 0.78  CALCIUM 9.5  --  10.4*  --  9.8  GFRNONAA 84  --  75  --  >60  GFRAA 96  --  86  --  >60  PROT 6.8  --  6.6  --  7.9  ALBUMIN 4.2  --  3.9  --  4.1  AST 35  --  34  --  26  ALT 49*  --  53*  --  22  ALKPHOS 64  --  58  --  66  BILITOT 0.3  --  0.3  --  0.7   < > = values in this interval not displayed.    RADIOGRAPHIC STUDIES: I have personally reviewed the radiological images as listed and agreed with the findings in the report. US Biopsy-Liver  Result Date: 07/21/2019 INDICATION: History of carcinoma of the ascending colon with development of 2 new masses in the liver suspicious for metastatic disease. EXAM: ULTRASOUND GUIDED CORE BIOPSY OF LIVER MEDICATIONS: None. ANESTHESIA/SEDATION: Fentanyl 100 mcg IV; Versed 2.0 mg IV Moderate Sedation Time:  24 minutes. The patient was continuously monitored during the procedure by the interventional radiology  nurse under my direct supervision. PROCEDURE: The procedure, risks, benefits, and alternatives were explained to the patient and her daughter. Questions regarding the procedure were encouraged and answered. Both the patient and daughter understand and consent was obtained from the daughter as the patient has a history of dementia. A time-out was performed prior to initiating the procedure. Initial ultrasound was performed to localize an inferior right hepatic mass. The abdominal wall was prepped with chlorhexidine in a sterile fashion, and a sterile drape was applied covering the operative field. A sterile gown and sterile gloves were used for the procedure. Local anesthesia was provided with 1% Lidocaine. Under ultrasound guidance, a 17 gauge trocar needle was advanced to the margin of a mass within the inferior aspect of the right lobe of the liver. After confirming needle tip position, 3 separate 18 gauge core biopsy samples were obtained. Material was submitted in formalin. Gel-Foam pledgets were advanced through the outer needle as it was retracted. Additional ultrasound was performed. COMPLICATIONS: None immediate. FINDINGS: Rounded mass in the inferior aspect of the right lobe of the liver measures approximately 4.2 x 3.6 x 3.9 cm. Solid tissue was obtained. IMPRESSION: Ultrasound-guided core biopsy performed of a mass in the inferior aspect of the right lobe of the liver measuring 4.2 cm in greatest diameter by ultrasound. Electronically Signed   By: Aletta Edouard M.D.   On: 07/21/2019 13:34    ASSESSMENT & PLAN:   Primary adenocarcinoma of ascending colon ([place]) #Recurrent/stage IV colon cancer.  Discussed is rather unfortunate that patient's prior stage I colon cancer is currently recurrent in the liver-s/p biopsy-adenocarcinoma; elevated CEA.  #I had a long discussion with the patient and her daughter regarding-the natural history of disease.  The median survival metastatic colon cancer on  treatment is approximately 24 months.  Without treatment-I explained the survival in the order of few months.  #Discussed regarding palliative treatment options of rate embolization/chemotherapy.  Patient declines as she is interested in quality of life/symptom management.  #Abdominal pain-likely secondary to progressive malignancy recommend tramadol/Tylenol-overall stable.  #Vascular dementia hydrocephalus followed by neurology; stable  #Chronic back pain-arthritis-stable continue breakthrough pain medication.  # Palliative care evaluation: Met with Josh Borders today; recommend hospice at the facility.  I had a long discussion with the patient and her daughter-then agreement.  Discussed with [facility].  # DISPOSITION: #Follow-up with me as needed.    All questions were answered. The patient knows to call the clinic with any problems, questions or concerns.    Cammie Sickle, MD 08/14/2019 12:15 PM

## 2019-08-10 ENCOUNTER — Encounter: Payer: Self-pay | Admitting: Internal Medicine

## 2019-08-26 DIAGNOSIS — N390 Urinary tract infection, site not specified: Secondary | ICD-10-CM | POA: Diagnosis not present

## 2019-08-26 DIAGNOSIS — D649 Anemia, unspecified: Secondary | ICD-10-CM | POA: Diagnosis not present

## 2019-08-26 DIAGNOSIS — R319 Hematuria, unspecified: Secondary | ICD-10-CM | POA: Diagnosis not present

## 2019-08-30 DIAGNOSIS — I10 Essential (primary) hypertension: Secondary | ICD-10-CM | POA: Diagnosis not present

## 2019-08-30 DIAGNOSIS — N390 Urinary tract infection, site not specified: Secondary | ICD-10-CM | POA: Diagnosis not present

## 2019-08-30 DIAGNOSIS — E039 Hypothyroidism, unspecified: Secondary | ICD-10-CM | POA: Diagnosis not present

## 2019-08-30 DIAGNOSIS — D649 Anemia, unspecified: Secondary | ICD-10-CM | POA: Diagnosis not present

## 2019-08-30 DIAGNOSIS — R319 Hematuria, unspecified: Secondary | ICD-10-CM | POA: Diagnosis not present

## 2019-09-01 DIAGNOSIS — N390 Urinary tract infection, site not specified: Secondary | ICD-10-CM | POA: Diagnosis not present

## 2019-09-01 DIAGNOSIS — R319 Hematuria, unspecified: Secondary | ICD-10-CM | POA: Diagnosis not present

## 2019-09-07 DIAGNOSIS — Z79899 Other long term (current) drug therapy: Secondary | ICD-10-CM | POA: Diagnosis not present

## 2019-09-07 DIAGNOSIS — N390 Urinary tract infection, site not specified: Secondary | ICD-10-CM | POA: Diagnosis not present

## 2019-09-07 DIAGNOSIS — R319 Hematuria, unspecified: Secondary | ICD-10-CM | POA: Diagnosis not present

## 2019-09-14 DIAGNOSIS — C189 Malignant neoplasm of colon, unspecified: Secondary | ICD-10-CM | POA: Diagnosis not present

## 2019-09-14 DIAGNOSIS — I10 Essential (primary) hypertension: Secondary | ICD-10-CM | POA: Diagnosis not present

## 2019-09-14 DIAGNOSIS — Z9181 History of falling: Secondary | ICD-10-CM | POA: Diagnosis not present

## 2019-09-14 DIAGNOSIS — I619 Nontraumatic intracerebral hemorrhage, unspecified: Secondary | ICD-10-CM | POA: Diagnosis not present

## 2019-09-14 DIAGNOSIS — E039 Hypothyroidism, unspecified: Secondary | ICD-10-CM | POA: Diagnosis not present

## 2019-09-15 DIAGNOSIS — R319 Hematuria, unspecified: Secondary | ICD-10-CM | POA: Diagnosis not present

## 2019-09-15 DIAGNOSIS — N390 Urinary tract infection, site not specified: Secondary | ICD-10-CM | POA: Diagnosis not present

## 2019-09-23 DIAGNOSIS — N390 Urinary tract infection, site not specified: Secondary | ICD-10-CM | POA: Diagnosis not present

## 2019-09-23 DIAGNOSIS — R319 Hematuria, unspecified: Secondary | ICD-10-CM | POA: Diagnosis not present

## 2019-09-26 DIAGNOSIS — Z79899 Other long term (current) drug therapy: Secondary | ICD-10-CM | POA: Diagnosis not present

## 2019-09-26 DIAGNOSIS — N390 Urinary tract infection, site not specified: Secondary | ICD-10-CM | POA: Diagnosis not present

## 2019-09-26 DIAGNOSIS — R319 Hematuria, unspecified: Secondary | ICD-10-CM | POA: Diagnosis not present

## 2019-09-26 DIAGNOSIS — D649 Anemia, unspecified: Secondary | ICD-10-CM | POA: Diagnosis not present

## 2019-10-11 DIAGNOSIS — Z1159 Encounter for screening for other viral diseases: Secondary | ICD-10-CM | POA: Diagnosis not present

## 2019-10-17 DIAGNOSIS — Z1159 Encounter for screening for other viral diseases: Secondary | ICD-10-CM | POA: Diagnosis not present

## 2019-10-24 DIAGNOSIS — Z1159 Encounter for screening for other viral diseases: Secondary | ICD-10-CM | POA: Diagnosis not present

## 2019-11-01 DIAGNOSIS — M2550 Pain in unspecified joint: Secondary | ICD-10-CM | POA: Diagnosis not present

## 2019-11-01 DIAGNOSIS — R404 Transient alteration of awareness: Secondary | ICD-10-CM | POA: Diagnosis not present

## 2019-11-01 DIAGNOSIS — I959 Hypotension, unspecified: Secondary | ICD-10-CM | POA: Diagnosis not present

## 2019-11-01 DIAGNOSIS — Z7401 Bed confinement status: Secondary | ICD-10-CM | POA: Diagnosis not present

## 2019-11-28 DEATH — deceased

## 2019-12-22 ENCOUNTER — Telehealth: Payer: Self-pay | Admitting: *Deleted

## 2019-12-22 NOTE — Telephone Encounter (Signed)
error 

## 2020-07-10 IMAGING — MR MR ABDOMEN WO/W CM
18 series · 47 of 48 positions shown · IV contrast (6ml Gadavist)
Comparison: PET-CT 07/03/2019

CLINICAL DATA: History of colon cancer. Subtle hypermetabolic liver
lesions on recent PET-CT.

EXAM:
MRI ABDOMEN WITHOUT AND WITH CONTRAST
TECHNIQUE: Multiplanar multisequence MR imaging of the abdomen was performed
both before and after the administration of intravenous contrast.
CONTRAST:  6mL GADAVIST GADOBUTROL 1 MMOL/ML IV SOLN

[Series 2: T2 · coronal · 6.0mm · 1.19mm/px · 2 of 30 slices shown (1 of 2)]
[im 1/30]
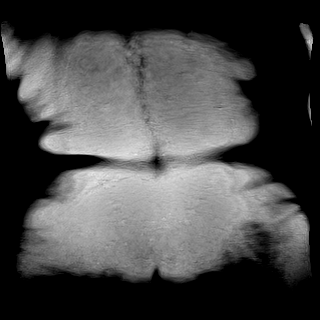
[im 30/30]
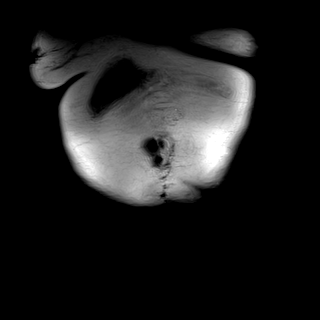

[Series 3: T2 · axial · 6.5mm · 1.19mm/px · z∈[-129,+112]mm · 2 of 32 slices shown (2 of 2)]
[im 1/32]
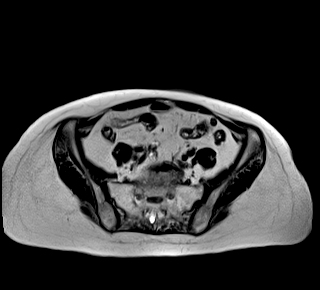
[im 32/32]
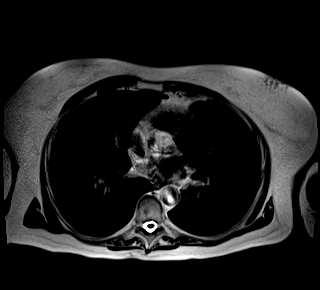

[Series 5: T2 fat-sat · axial · 6.0mm · 1.19mm/px · z∈[-127,+111]mm · 2 of 34 slices shown]
[im 1/34]
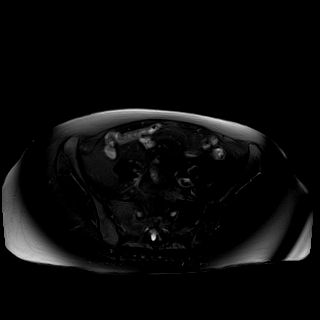
[im 34/34]
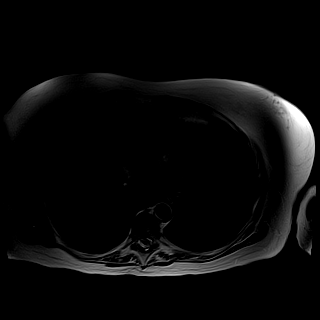

[Series 6: ax dwi_tracew · axial · 6.0mm · 1.42mm/px · z∈[-127,+111]mm · 5 of 102 slices shown]
[im 1/102]
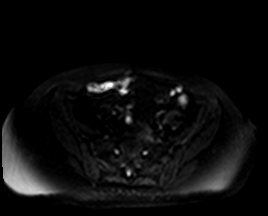
[im 26/102]
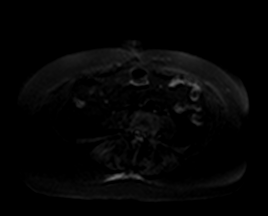
[im 51/102]
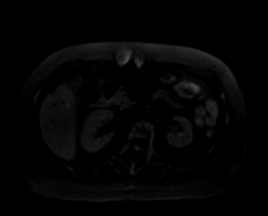
[im 76/102]
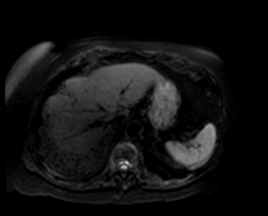
[im 102/102]
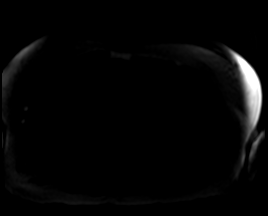

[Series 7: ax dwi_adc · axial · 6.0mm · 1.42mm/px · z∈[-127,+111]mm · 2 of 34 slices shown]
[im 1/34]
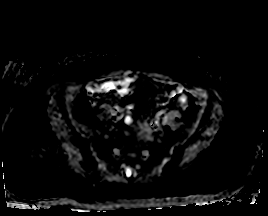
[im 34/34]
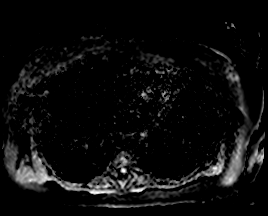

[Series 8: T1 · axial · 6.5mm · 0.74mm/px · z∈[-129,+112]mm · 2 of 32 slices shown (1 of 2)]
[im 1/32]
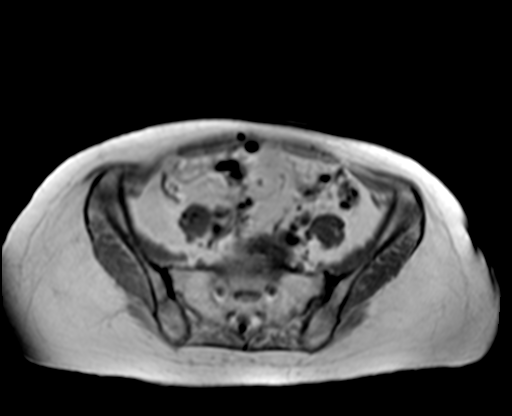
[im 32/32]
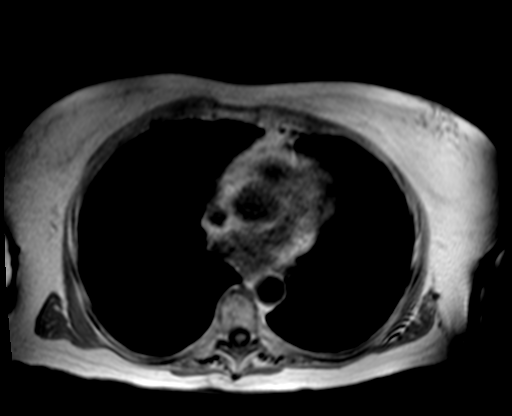

[Series 8: T1 · axial · 6.5mm · 0.74mm/px · z∈[-129,+112]mm · 2 of 32 slices shown (2 of 2)]
[im 1/32]
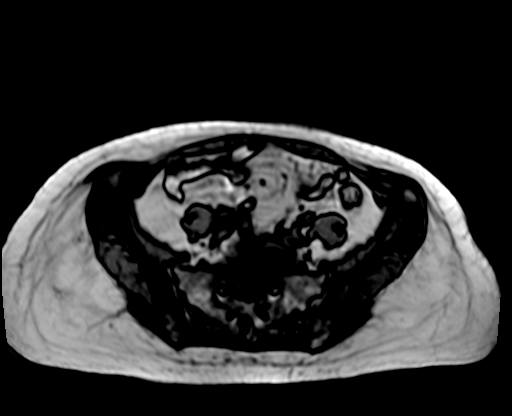
[im 32/32]
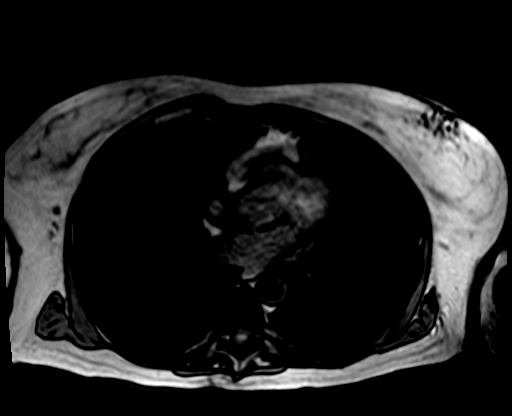

[Series 9: bSSFP · axial · 6.5mm · 0.74mm/px · 1 of 32 slices shown]
[im 1/32]
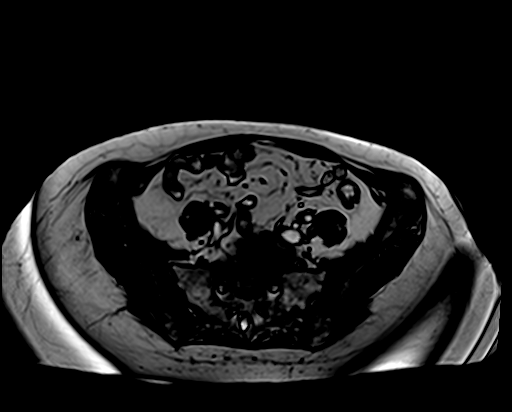

[Series 10: T1 dynamic fat-sat · axial · non-contrast · 3.0mm · 1.19mm/px · z∈[-123,+114]mm · 3 of 80 slices shown (1 of 5)]
[im 1/80]
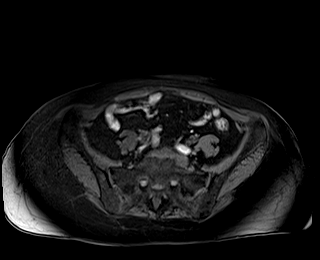
[im 40/80]
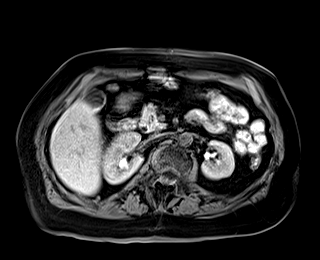
[im 80/80]
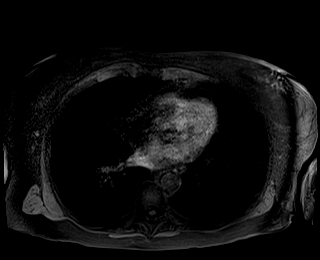

[Series 11: T1 dynamic fat-sat post-contrast · axial · 3.0mm · 1.19mm/px · z∈[-123,+114]mm · 3 of 80 slices shown (1 of 4)]
[im 1/80]
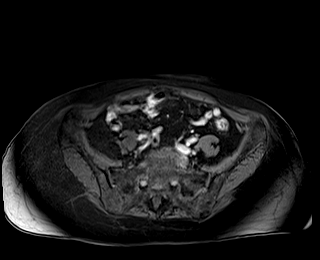
[im 40/80]
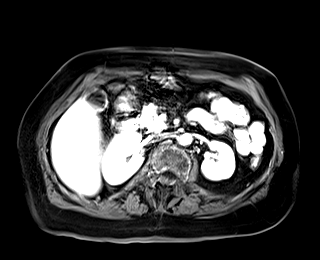
[im 80/80]
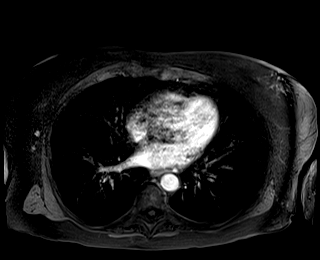

[Series 12: T1 dynamic fat-sat · axial · 3.0mm · 1.19mm/px · z∈[-123,+114]mm · 3 of 80 slices shown (2 of 5)]
[im 1/80]
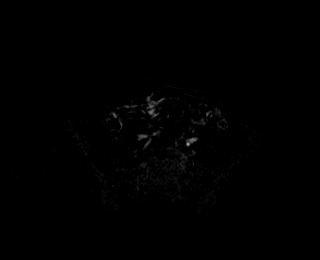
[im 40/80]
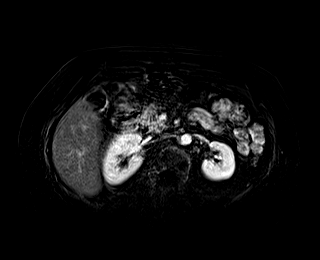
[im 80/80]
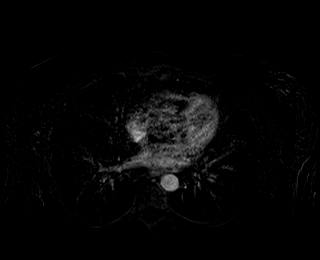

[Series 13: T1 dynamic fat-sat post-contrast · axial · 3.0mm · 1.19mm/px · z∈[-123,+114]mm · 3 of 80 slices shown (2 of 4)]
[im 1/80]
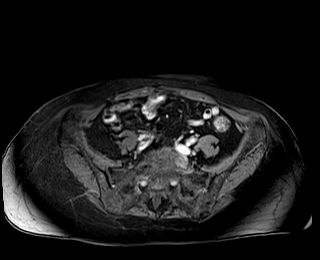
[im 40/80]
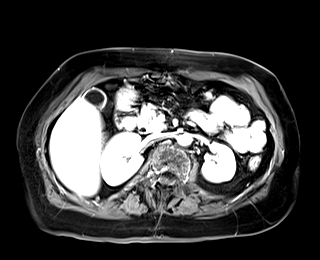
[im 80/80]
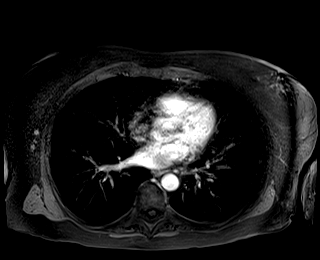

[Series 14: T1 dynamic fat-sat · axial · 3.0mm · 1.19mm/px · z∈[-123,+114]mm · 3 of 80 slices shown (3 of 5)]
[im 1/80]
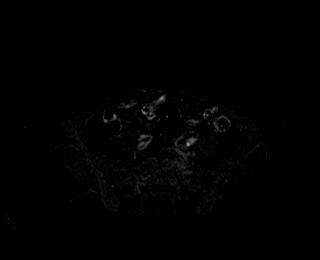
[im 40/80]
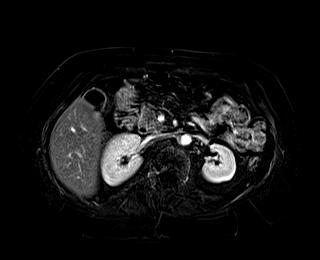
[im 80/80]
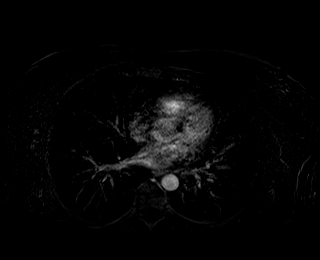

[Series 15: T1 dynamic fat-sat post-contrast · axial · 3.0mm · 1.19mm/px · z∈[-123,+114]mm · 3 of 80 slices shown (3 of 4)]
[im 1/80]
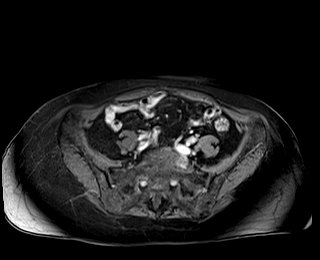
[im 40/80]
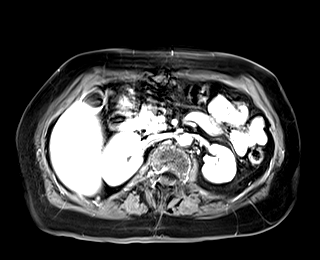
[im 80/80]
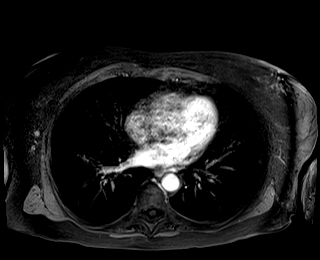

[Series 16: T1 dynamic fat-sat · axial · 3.0mm · 1.19mm/px · z∈[-123,+114]mm · 3 of 80 slices shown (4 of 5)]
[im 1/80]
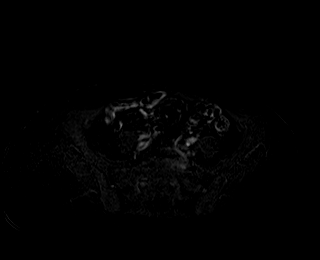
[im 40/80]
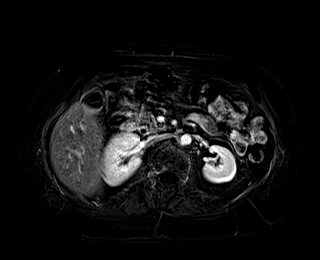
[im 80/80]
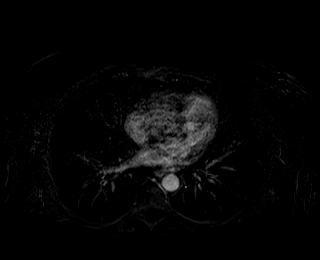

[Series 17: T1 dynamic post-contrast · coronal · 3.0mm · 1.31mm/px · 3 of 72 slices shown]
[im 1/72]
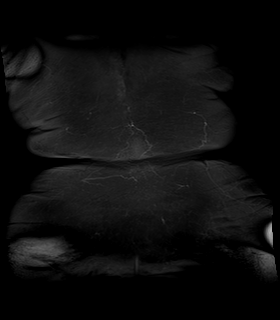
[im 36/72]
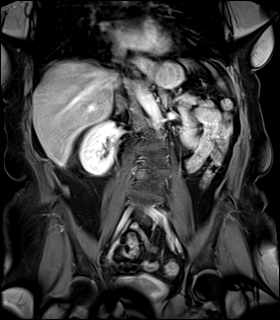
[im 72/72]
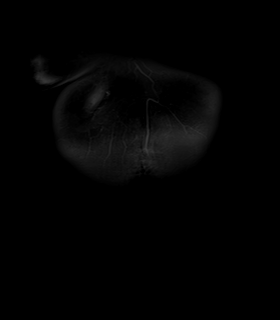

[Series 18: T1 dynamic fat-sat post-contrast · axial · 3.0mm · 1.19mm/px · z∈[-123,+114]mm · 3 of 80 slices shown (4 of 4)]
[im 1/80]
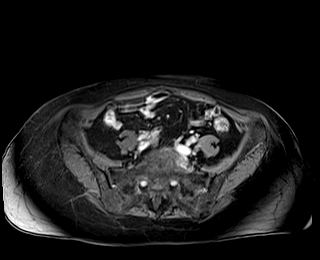
[im 40/80]
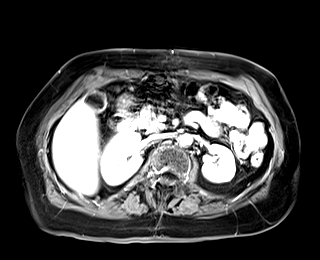
[im 80/80]
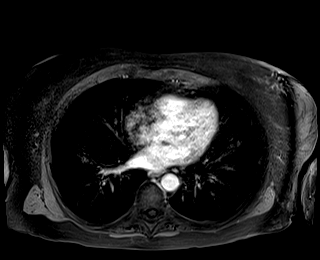

[Series 19: T1 dynamic fat-sat · axial · 3.0mm · 1.19mm/px · z∈[-123,-6]mm · 2 of 80 slices shown (5 of 5)]
[im 1/80]
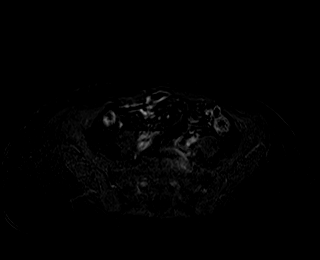
[im 40/80]
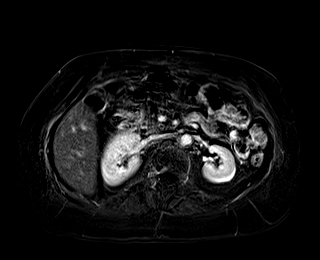

[47 of 48 positions shown; findings below may reference images not displayed]

FINDINGS: Lower chest: UNREMARKABLE.

Hepatobiliary: 2.5 cm T1 hypointense, T2 hyperintense rim enhancing
lesion is identified in the extreme dome of liver. This lesion
restricts diffusion and corresponds to the hypermetabolic focus
noted on the recent PET-CT.

3.6 x 3.2 cm T1 hypointense, minimally T2 hyperintense lesion is
identified in the inferior tip of the right liver. This shows
heterogeneous enhancement after IV contrast administration with a
slight rim predominant pattern. This lesion also restricts diffusion
and was seen to be hypermetabolic on the recent PET-CT. Tiny 3-4 mm
cyst noted medial right liver on T2 axial 11 of series 5.

Layering sludge noted in the lumen of the gallbladder No
intrahepatic or extrahepatic biliary dilation.

Pancreas: No focal mass lesion. No dilatation of the main duct. No
intraparenchymal cyst. No peripancreatic edema.

Spleen:  No splenomegaly. No focal mass lesion.

Adrenals/Urinary Tract: No adrenal nodule or mass. Kidneys
unremarkable.

Stomach/Bowel: Tiny hiatal hernia. Stomach otherwise unremarkable.
Duodenum is normally positioned as is the ligament of Treitz. No
small bowel or colonic dilatation within the visualized abdomen.

Vascular/Lymphatic: No abdominal aortic aneurysm. No abdominal
lymphadenopathy.

Other:  No intraperitoneal free fluid.

Musculoskeletal: Small midline ventral hernia contains a short
segment of transverse colon without complicating features. No
abnormal marrow enhancement within the visualized bony anatomy.
IMPRESSION: 1. 2.5 cm rim enhancing lesion in the hepatic dome and 3.6 cm lesion
in the inferior tip of the right liver correspond to the areas of
hypermetabolism seen on the recent PET-CT and are consistent with
metastatic disease. No other soft tissue metastases noted in the
abdomen.
2. Tiny hiatal hernia.
